# Patient Record
Sex: Male | Born: 1940 | Race: White | Hispanic: No | Marital: Married | Smoking: Former smoker
Health system: Southern US, Community
[De-identification: ages and names within clinical notes are randomized; demographics above are authoritative.]

## PROBLEM LIST (undated history)

## (undated) DIAGNOSIS — T4145XA Adverse effect of unspecified anesthetic, initial encounter: Secondary | ICD-10-CM

## (undated) DIAGNOSIS — R001 Bradycardia, unspecified: Secondary | ICD-10-CM

## (undated) DIAGNOSIS — I350 Nonrheumatic aortic (valve) stenosis: Secondary | ICD-10-CM

## (undated) DIAGNOSIS — D61818 Other pancytopenia: Secondary | ICD-10-CM

## (undated) DIAGNOSIS — D414 Neoplasm of uncertain behavior of bladder: Secondary | ICD-10-CM

## (undated) DIAGNOSIS — K219 Gastro-esophageal reflux disease without esophagitis: Secondary | ICD-10-CM

## (undated) DIAGNOSIS — R51 Headache: Secondary | ICD-10-CM

## (undated) DIAGNOSIS — F419 Anxiety disorder, unspecified: Secondary | ICD-10-CM

## (undated) DIAGNOSIS — K819 Cholecystitis, unspecified: Secondary | ICD-10-CM

## (undated) DIAGNOSIS — I10 Essential (primary) hypertension: Secondary | ICD-10-CM

## (undated) DIAGNOSIS — I714 Abdominal aortic aneurysm, without rupture, unspecified: Secondary | ICD-10-CM

## (undated) DIAGNOSIS — M2550 Pain in unspecified joint: Secondary | ICD-10-CM

## (undated) DIAGNOSIS — I480 Paroxysmal atrial fibrillation: Secondary | ICD-10-CM

## (undated) DIAGNOSIS — E119 Type 2 diabetes mellitus without complications: Secondary | ICD-10-CM

## (undated) DIAGNOSIS — I219 Acute myocardial infarction, unspecified: Secondary | ICD-10-CM

## (undated) DIAGNOSIS — I259 Chronic ischemic heart disease, unspecified: Secondary | ICD-10-CM

## (undated) DIAGNOSIS — I509 Heart failure, unspecified: Secondary | ICD-10-CM

## (undated) DIAGNOSIS — I2510 Atherosclerotic heart disease of native coronary artery without angina pectoris: Secondary | ICD-10-CM

## (undated) DIAGNOSIS — J189 Pneumonia, unspecified organism: Secondary | ICD-10-CM

## (undated) DIAGNOSIS — I779 Disorder of arteries and arterioles, unspecified: Secondary | ICD-10-CM

## (undated) DIAGNOSIS — F329 Major depressive disorder, single episode, unspecified: Secondary | ICD-10-CM

## (undated) DIAGNOSIS — E669 Obesity, unspecified: Secondary | ICD-10-CM

## (undated) DIAGNOSIS — Z952 Presence of prosthetic heart valve: Secondary | ICD-10-CM

## (undated) DIAGNOSIS — T8859XA Other complications of anesthesia, initial encounter: Secondary | ICD-10-CM

## (undated) DIAGNOSIS — G4733 Obstructive sleep apnea (adult) (pediatric): Secondary | ICD-10-CM

## (undated) DIAGNOSIS — Z950 Presence of cardiac pacemaker: Secondary | ICD-10-CM

## (undated) DIAGNOSIS — E785 Hyperlipidemia, unspecified: Secondary | ICD-10-CM

## (undated) DIAGNOSIS — J449 Chronic obstructive pulmonary disease, unspecified: Secondary | ICD-10-CM

## (undated) DIAGNOSIS — F32A Depression, unspecified: Secondary | ICD-10-CM

## (undated) DIAGNOSIS — Z8709 Personal history of other diseases of the respiratory system: Secondary | ICD-10-CM

## (undated) DIAGNOSIS — D649 Anemia, unspecified: Secondary | ICD-10-CM

## (undated) HISTORY — PX: OTHER SURGICAL HISTORY: SHX169

## (undated) HISTORY — PX: TONSILLECTOMY: SUR1361

## (undated) HISTORY — PX: AORTIC VALVE REPLACEMENT: SHX41

## (undated) HISTORY — DX: Abdominal aortic aneurysm, without rupture: I71.4

## (undated) HISTORY — DX: Heart failure, unspecified: I50.9

## (undated) HISTORY — PX: HERNIA REPAIR: SHX51

## (undated) HISTORY — DX: Nonrheumatic aortic (valve) stenosis: I35.0

## (undated) HISTORY — PX: ABDOMINAL AORTIC ANEURYSM REPAIR: SUR1152

## (undated) HISTORY — DX: Abdominal aortic aneurysm, without rupture, unspecified: I71.40

## (undated) HISTORY — PX: VALVE REPLACEMENT: SUR13

## (undated) HISTORY — DX: Chronic obstructive pulmonary disease, unspecified: J44.9

## (undated) HISTORY — PX: CORONARY ANGIOPLASTY WITH STENT PLACEMENT: SHX49

---
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
## 2000-07-07 ENCOUNTER — Encounter: Admission: RE | Admit: 2000-07-07 | Discharge: 2000-07-17 | Payer: Self-pay | Admitting: Orthopedic Surgery

## 2004-03-28 ENCOUNTER — Ambulatory Visit: Payer: Self-pay | Admitting: Family Medicine

## 2004-04-17 ENCOUNTER — Ambulatory Visit: Payer: Self-pay | Admitting: Family Medicine

## 2004-05-24 ENCOUNTER — Ambulatory Visit: Payer: Self-pay | Admitting: Family Medicine

## 2004-06-18 ENCOUNTER — Ambulatory Visit: Payer: Self-pay | Admitting: Family Medicine

## 2004-07-02 ENCOUNTER — Ambulatory Visit: Payer: Self-pay | Admitting: Family Medicine

## 2004-10-09 ENCOUNTER — Ambulatory Visit: Payer: Self-pay | Admitting: Family Medicine

## 2005-02-18 ENCOUNTER — Ambulatory Visit: Payer: Self-pay | Admitting: Family Medicine

## 2005-03-14 ENCOUNTER — Ambulatory Visit: Payer: Self-pay | Admitting: Family Medicine

## 2005-06-12 ENCOUNTER — Ambulatory Visit: Payer: Self-pay | Admitting: Family Medicine

## 2005-06-24 ENCOUNTER — Ambulatory Visit: Payer: Self-pay | Admitting: Family Medicine

## 2005-10-17 ENCOUNTER — Ambulatory Visit: Payer: Self-pay | Admitting: Family Medicine

## 2006-04-28 ENCOUNTER — Ambulatory Visit: Payer: Self-pay | Admitting: Family Medicine

## 2006-07-29 ENCOUNTER — Ambulatory Visit: Payer: Self-pay | Admitting: Family Medicine

## 2006-09-22 ENCOUNTER — Ambulatory Visit: Payer: Self-pay | Admitting: Family Medicine

## 2008-04-15 HISTORY — PX: CORONARY ANGIOPLASTY: SHX604

## 2012-02-20 ENCOUNTER — Other Ambulatory Visit: Payer: Self-pay | Admitting: Orthopedic Surgery

## 2012-02-20 DIAGNOSIS — M25511 Pain in right shoulder: Secondary | ICD-10-CM

## 2012-02-28 ENCOUNTER — Ambulatory Visit
Admission: RE | Admit: 2012-02-28 | Discharge: 2012-02-28 | Disposition: A | Discharge status: Home / Self Care | Payer: BC Managed Care – PPO | Source: Ambulatory Visit | Attending: Orthopedic Surgery | Admitting: Orthopedic Surgery

## 2012-02-28 ENCOUNTER — Other Ambulatory Visit: Payer: Self-pay | Admitting: Orthopedic Surgery

## 2012-02-28 DIAGNOSIS — M25511 Pain in right shoulder: Secondary | ICD-10-CM

## 2012-02-28 DIAGNOSIS — Z77018 Contact with and (suspected) exposure to other hazardous metals: Secondary | ICD-10-CM

## 2012-04-02 ENCOUNTER — Encounter (HOSPITAL_COMMUNITY): Payer: Self-pay | Admitting: Pharmacy Technician

## 2012-04-10 ENCOUNTER — Encounter (HOSPITAL_COMMUNITY)
Admission: RE | Admit: 2012-04-10 | Discharge: 2012-04-10 | Disposition: A | Discharge status: Home / Self Care | Payer: BC Managed Care – PPO | Source: Ambulatory Visit | Attending: Anesthesiology | Admitting: Anesthesiology

## 2012-04-10 ENCOUNTER — Encounter (HOSPITAL_COMMUNITY): Payer: Self-pay

## 2012-04-10 ENCOUNTER — Encounter (HOSPITAL_COMMUNITY)
Admission: RE | Admit: 2012-04-10 | Discharge: 2012-04-10 | Disposition: A | Discharge status: Home / Self Care | Payer: BC Managed Care – PPO | Source: Ambulatory Visit | Attending: Orthopedic Surgery | Admitting: Orthopedic Surgery

## 2012-04-10 HISTORY — DX: Pain in unspecified joint: M25.50

## 2012-04-10 HISTORY — DX: Gastro-esophageal reflux disease without esophagitis: K21.9

## 2012-04-10 HISTORY — DX: Hyperlipidemia, unspecified: E78.5

## 2012-04-10 HISTORY — DX: Essential (primary) hypertension: I10

## 2012-04-10 HISTORY — DX: Personal history of other diseases of the respiratory system: Z87.09

## 2012-04-10 HISTORY — DX: Pneumonia, unspecified organism: J18.9

## 2012-04-10 HISTORY — DX: Adverse effect of unspecified anesthetic, initial encounter: T41.45XA

## 2012-04-10 HISTORY — DX: Other complications of anesthesia, initial encounter: T88.59XA

## 2012-04-10 HISTORY — DX: Headache: R51

## 2012-04-10 HISTORY — DX: Atherosclerotic heart disease of native coronary artery without angina pectoris: I25.10

## 2012-04-10 HISTORY — DX: Type 2 diabetes mellitus without complications: E11.9

## 2012-04-10 HISTORY — DX: Acute myocardial infarction, unspecified: I21.9

## 2012-04-10 LAB — CBC
HCT: 41.1 % (ref 39.0–52.0)
Hemoglobin: 13.8 g/dL (ref 13.0–17.0)
MCH: 31 pg (ref 26.0–34.0)
MCHC: 33.6 g/dL (ref 30.0–36.0)
RBC: 4.45 MIL/uL (ref 4.22–5.81)

## 2012-04-10 LAB — BASIC METABOLIC PANEL
BUN: 14 mg/dL (ref 6–23)
Chloride: 102 mEq/L (ref 96–112)
Glucose, Bld: 111 mg/dL — ABNORMAL HIGH (ref 70–99)
Potassium: 3.8 mEq/L (ref 3.5–5.1)
Sodium: 140 mEq/L (ref 135–145)

## 2012-04-10 NOTE — Progress Notes (Signed)
Requested orders from [place] @ Dr.Caffrey's office

## 2012-04-10 NOTE — Pre-Procedure Instructions (Signed)
20 Matthew Campos  04/10/2012   Your procedure is scheduled on:  Fri, Jan 3 @ 10:00 AM  Report to [facility] at 8:00 AM.  Call this number if you have problems the morning of surgery: [phone]   Remember:   Do not eat food:After Midnight.    Take these medicines the morning of surgery with A SIP OF WATER: Dexilant(Dexlansoprazole),Pain Pill(if needed), and Metoprolol(Lopressor)   Do not wear jewelry  Do not wear lotions, powders, or colognes. You may wear deodorant.  Men may shave face and neck.  Do not bring valuables to the hospital.  Contacts, dentures or bridgework may not be worn into surgery.  Leave suitcase in the car. After surgery it may be brought to your room.  For patients admitted to the hospital, checkout time is 11:00 AM the day of discharge.   Patients discharged the day of surgery will not be allowed to drive home.  Special Instructions: Shower using CHG 2 nights before surgery and the night before surgery.  If you shower the day of surgery use CHG.  Use special wash - you have one bottle of CHG for all showers.  You should use approximately 1/3 of the bottle for each shower.   Please read over the following fact sheets that you were given: Pain Booklet, Coughing and Deep Breathing, MRSA Information and Surgical Site Infection Prevention

## 2012-04-10 NOTE — Progress Notes (Signed)
Dr. Cloria Spring is cardiologist with last visit in Oct 2013-to request report  Stress test done >13yrs ago Echo done > 66yrs ago Last heart cath-to request from [facility] in [place] is medical md  EKG to be requested from Dr.Michael Dorena Cookey Denies cxr being done within past yr

## 2012-04-10 NOTE — Progress Notes (Signed)
Sleep study done > 62yrs ago and was using a CPAP but not in 39yrs

## 2012-04-10 NOTE — Progress Notes (Signed)
04/10/12 1255  OBSTRUCTIVE SLEEP APNEA  Have you ever been diagnosed with sleep apnea through a sleep study? Yes  If yes, do you have and use a CPAP or BPAP machine every night? 0  Do you snore loudly (loud enough to be heard through closed doors)?  1  Do you often feel tired, fatigued, or sleepy during the daytime? 1  Has anyone observed you stop breathing during your sleep? 1  Do you have, or are you being treated for high blood pressure? 1  BMI more than 35 kg/m2? 0  Age over 19 years old? 1  Gender: 1  Obstructive Sleep Apnea Score 6   Score 4 or greater  Results sent to PCP

## 2012-04-16 ENCOUNTER — Other Ambulatory Visit: Payer: Self-pay | Admitting: Physician Assistant

## 2012-04-16 NOTE — H&P (Signed)
[facility] [address]   [address] [place], [place] [address] [phone] A Division of [facility]  Daniel F. Murphy, M.D.   Robert A. Wainer, M.D.   W. Dan Caffrey, M.D.   Sadler Teschner P. Landau, M.D.   Anna Voytek, M.D Wadena R. Ibazebo, M.D.  S. Michael Tooke, M.D.   James S. Kramer, M.D.   Rebecca S. Bassett, M.D. James M. Owens, PA-C            Kirstin A. Shepperson, PA-C Josh Kristine Chahal, PA-C Brandon Parry, OPA-C   RE: Matthew Campos, Matthew Campos                                [id number]      DOB: 03/06/1941 PROGRESS NOTE: 04-06-12 Chief complaint: Right rotator cuff tear.  History of present illness: Patient is a 71 year-old male initially seen back in September of this year with complaints of right shoulder pain.  She was treated conservatively since that time.  Failed conservative treatments include home exercise program, Corticosteroid injection and pain medication.  We had obtained an MRI which shows a severe tear at the cuff, subscapularis.  Patient with a significant cardiac history.  We discussed that he would need cardiac clearance prior to scheduling any surgical intervention.   Review of systems: Ten point review of systems obtained.  Positive for glasses/contacts, heart attack, heart disease, diabetes, not urinating and sleep apnea.   Past medical history: Diabetes, high cholesterol, hypertension, sleep apnea, history of MI with stent placement x 3, history of aneurysm and left shoulder rotator cuff repair. Current medications: Losartan 100 mg daily, Lopressor 100 mg daily, Lipitor 80 mg daily, Dexilant 60 mg daily, Metformin 1000 mg once in the morning and half in the evening, Plavix 75 mg daily, Vitamin E, baby Aspirin, multivitamin, fish oil, Januvia 100 mg daily, Hydrocodone 5/325 p.r.n. pain and Nitroglycerin 0.4 mg sublingual p.r.n. chest pain.   Allergies: He lists drug allergies to Codeine and latex. Family history: Positive  for heart disease and diabetes in his father.  Otherwise negative.   Social history: Patient is a pack per day smoker for 30 years, stopped in 1989.  He does not drink alcohol.  He is married and lives with his wife. He works as a pallet builder.    EXAMINATION: Temperature: 98.6.  Heart rate: 59.  Blood pressure: 130/76.  Respirations: 18.  Patient is seated in the exam room in no acute distress.  Alert and oriented.  Appears appropriate age.  Examination of the right upper extremity shows he is neurovascularly intact.  He has a painful arc of motion with the right shoulder.  Positive impingement.  Weakness noted with supraspinatus.  HEENT: Pupils are equal, round and reactive to light.  No dentures or oral implants.  Neck is supple with good range of motion.  No cervical adenopathy.  Chest and lungs are clear to auscultation bilaterally.  Normal breath sounds.  Normal effort.  Cardiac: Regular rate and rhythm.  He does have a systolic murmur.  Abdomen is soft and non-tender.  Non-distended.  Active bowel sounds in all four quadrants.  Neuro: Cranial nerves are grossly intact.  Skin: No rashes or lesions.  No erythema.  GU/Rectal/Breasts: Not indicated for surgery.    X-RAYS: No images obtained today.  See MRI results above.   IMPRESSION: 1. Right rotator cuff tear.   2. Diabetes. 3. High cholesterol.   4. Hypertension. 5. Sleep apnea. 6. History of MI with stents.    PLAN: We have discussed the risks and benefits of right shoulder surgery.  Plan for right shoulder arthroscopy, acromioplasty and possible open rotator cuff repair.  He has obtained cardiac clearance from his cardiologist, Dr. Kutcher, stating it is okay to stop Plavix five days prior to surgery.  He should resume his baby Aspirin daily.  Preadmission appointment scheduled for April 10, 2012.  Surgery is scheduled for April 17, 2012.  If he has any questions or concerns prior to surgery he may contact the office.    Alaa Eyerman A.  Avigayil Ton, PA-C 

## 2012-04-17 ENCOUNTER — Ambulatory Visit (HOSPITAL_COMMUNITY): Payer: BC Managed Care – PPO | Admitting: Certified Registered Nurse Anesthetist

## 2012-04-17 ENCOUNTER — Encounter (HOSPITAL_COMMUNITY): Payer: Self-pay | Admitting: Certified Registered Nurse Anesthetist

## 2012-04-17 ENCOUNTER — Ambulatory Visit (HOSPITAL_COMMUNITY)
Admission: RE | Admit: 2012-04-17 | Discharge: 2012-04-17 | Disposition: A | Discharge status: Home / Self Care | Payer: BC Managed Care – PPO | Source: Ambulatory Visit | Attending: Orthopedic Surgery | Admitting: Orthopedic Surgery

## 2012-04-17 ENCOUNTER — Encounter (HOSPITAL_COMMUNITY)
Admission: RE | Disposition: A | Discharge status: Home / Self Care | Payer: Self-pay | Source: Ambulatory Visit | Attending: Orthopedic Surgery

## 2012-04-17 DIAGNOSIS — Z7902 Long term (current) use of antithrombotics/antiplatelets: Secondary | ICD-10-CM | POA: Insufficient documentation

## 2012-04-17 DIAGNOSIS — I2510 Atherosclerotic heart disease of native coronary artery without angina pectoris: Secondary | ICD-10-CM | POA: Insufficient documentation

## 2012-04-17 DIAGNOSIS — E7800 Pure hypercholesterolemia, unspecified: Secondary | ICD-10-CM | POA: Insufficient documentation

## 2012-04-17 DIAGNOSIS — F17200 Nicotine dependence, unspecified, uncomplicated: Secondary | ICD-10-CM | POA: Insufficient documentation

## 2012-04-17 DIAGNOSIS — G4730 Sleep apnea, unspecified: Secondary | ICD-10-CM | POA: Insufficient documentation

## 2012-04-17 DIAGNOSIS — E119 Type 2 diabetes mellitus without complications: Secondary | ICD-10-CM | POA: Insufficient documentation

## 2012-04-17 DIAGNOSIS — M19019 Primary osteoarthritis, unspecified shoulder: Secondary | ICD-10-CM | POA: Insufficient documentation

## 2012-04-17 DIAGNOSIS — I10 Essential (primary) hypertension: Secondary | ICD-10-CM | POA: Insufficient documentation

## 2012-04-17 DIAGNOSIS — M24819 Other specific joint derangements of unspecified shoulder, not elsewhere classified: Secondary | ICD-10-CM | POA: Insufficient documentation

## 2012-04-17 DIAGNOSIS — M75120 Complete rotator cuff tear or rupture of unspecified shoulder, not specified as traumatic: Secondary | ICD-10-CM | POA: Insufficient documentation

## 2012-04-17 DIAGNOSIS — M25819 Other specified joint disorders, unspecified shoulder: Secondary | ICD-10-CM | POA: Insufficient documentation

## 2012-04-17 DIAGNOSIS — Z7982 Long term (current) use of aspirin: Secondary | ICD-10-CM | POA: Insufficient documentation

## 2012-04-17 DIAGNOSIS — Z9861 Coronary angioplasty status: Secondary | ICD-10-CM | POA: Insufficient documentation

## 2012-04-17 DIAGNOSIS — I252 Old myocardial infarction: Secondary | ICD-10-CM | POA: Insufficient documentation

## 2012-04-17 HISTORY — PX: SHOULDER ARTHROSCOPY WITH ROTATOR CUFF REPAIR AND SUBACROMIAL DECOMPRESSION: SHX5686

## 2012-04-17 LAB — GLUCOSE, CAPILLARY: Glucose-Capillary: 106 mg/dL — ABNORMAL HIGH (ref 70–99)

## 2012-04-17 SURGERY — SHOULDER ARTHROSCOPY WITH ROTATOR CUFF REPAIR AND SUBACROMIAL DECOMPRESSION
Anesthesia: Regional | Site: Shoulder | Laterality: Right | Wound class: Clean

## 2012-04-17 MED ORDER — SODIUM CHLORIDE 0.9 % IV SOLN
10.0000 mg | INTRAVENOUS | Status: DC | PRN
  Administered 2012-04-17: 25 ug/min via INTRAVENOUS

## 2012-04-17 MED ORDER — LIDOCAINE HCL 4 % MT SOLN
OROMUCOSAL | Status: DC | PRN
  Administered 2012-04-17: 4 mL via TOPICAL

## 2012-04-17 MED ORDER — ROCURONIUM BROMIDE 100 MG/10ML IV SOLN
INTRAVENOUS | Status: DC | PRN
  Administered 2012-04-17: 50 mg via INTRAVENOUS

## 2012-04-17 MED ORDER — ONDANSETRON HCL 4 MG/2ML IJ SOLN: 4.0000 mg | Freq: Four times a day (QID) | INTRAMUSCULAR | Status: DC | PRN

## 2012-04-17 MED ORDER — FENTANYL CITRATE 0.05 MG/ML IJ SOLN
50.0000 ug | INTRAMUSCULAR | Status: DC | PRN
  Administered 2012-04-17: 50 ug via INTRAVENOUS

## 2012-04-17 MED ORDER — SODIUM CHLORIDE 0.9 % IR SOLN
Status: DC | PRN
  Administered 2012-04-17 (×2): 3000 mL

## 2012-04-17 MED ORDER — LIDOCAINE HCL (CARDIAC) 20 MG/ML IV SOLN
INTRAVENOUS | Status: DC | PRN
  Administered 2012-04-17: 100 mg via INTRAVENOUS

## 2012-04-17 MED ORDER — METHOCARBAMOL 100 MG/ML IJ SOLN: 500.0000 mg | Freq: Four times a day (QID) | INTRAVENOUS | Status: DC | PRN

## 2012-04-17 MED ORDER — METHOCARBAMOL 100 MG/ML IJ SOLN
500.0000 mg | INTRAVENOUS | Status: AC
  Administered 2012-04-17: 500 mg via INTRAVENOUS
  Filled 2012-04-17: qty 5

## 2012-04-17 MED ORDER — PHENYLEPHRINE HCL 10 MG/ML IJ SOLN
INTRAMUSCULAR | Status: DC | PRN
  Administered 2012-04-17: 80 ug via INTRAVENOUS
  Administered 2012-04-17: 40 ug via INTRAVENOUS

## 2012-04-17 MED ORDER — CHLORHEXIDINE GLUCONATE 4 % EX LIQD: 60.0000 mL | Freq: Once | CUTANEOUS | Status: DC

## 2012-04-17 MED ORDER — FENTANYL CITRATE 0.05 MG/ML IJ SOLN
INTRAMUSCULAR | Status: AC
  Filled 2012-04-17: qty 2

## 2012-04-17 MED ORDER — NEOSTIGMINE METHYLSULFATE 1 MG/ML IJ SOLN
INTRAMUSCULAR | Status: DC | PRN
  Administered 2012-04-17: 4 mg via INTRAVENOUS

## 2012-04-17 MED ORDER — HYDROMORPHONE HCL PF 1 MG/ML IJ SOLN: 0.2500 mg | INTRAMUSCULAR | Status: DC | PRN

## 2012-04-17 MED ORDER — MIDAZOLAM HCL 2 MG/2ML IJ SOLN
INTRAMUSCULAR | Status: AC
  Filled 2012-04-17: qty 2

## 2012-04-17 MED ORDER — SODIUM CHLORIDE 0.9 % IV SOLN: INTRAVENOUS | Status: DC

## 2012-04-17 MED ORDER — CEFAZOLIN SODIUM-DEXTROSE 2-3 GM-% IV SOLR: 2.0000 g | INTRAVENOUS | Status: DC

## 2012-04-17 MED ORDER — METHOCARBAMOL 500 MG PO TABS
500.0000 mg | ORAL_TABLET | Freq: Four times a day (QID) | ORAL | Status: DC | PRN
Start: 2012-04-17 — End: 2012-04-17

## 2012-04-17 MED ORDER — MIDAZOLAM HCL 2 MG/2ML IJ SOLN
1.0000 mg | INTRAMUSCULAR | Status: DC | PRN
  Administered 2012-04-17: 1 mg via INTRAVENOUS

## 2012-04-17 MED ORDER — FENTANYL CITRATE 0.05 MG/ML IJ SOLN
INTRAMUSCULAR | Status: DC | PRN
  Administered 2012-04-17: 50 ug via INTRAVENOUS

## 2012-04-17 MED ORDER — PROPOFOL 10 MG/ML IV BOLUS
INTRAVENOUS | Status: DC | PRN
  Administered 2012-04-17: 170 mg via INTRAVENOUS

## 2012-04-17 MED ORDER — GLYCOPYRROLATE 0.2 MG/ML IJ SOLN
INTRAMUSCULAR | Status: DC | PRN
  Administered 2012-04-17: 0.6 mg via INTRAVENOUS
  Administered 2012-04-17: 0.2 mg via INTRAVENOUS

## 2012-04-17 MED ORDER — CEFAZOLIN SODIUM-DEXTROSE 2-3 GM-% IV SOLR
INTRAVENOUS | Status: AC
  Administered 2012-04-17: 2 g via INTRAVENOUS
  Filled 2012-04-17: qty 50

## 2012-04-17 MED ORDER — LACTATED RINGERS IV SOLN
INTRAVENOUS | Status: DC | PRN
  Administered 2012-04-17: 10:00:00 via INTRAVENOUS

## 2012-04-17 MED ORDER — LACTATED RINGERS IV SOLN
INTRAVENOUS | Status: DC
  Administered 2012-04-17: 09:00:00 via INTRAVENOUS

## 2012-04-17 MED ORDER — ONDANSETRON HCL 4 MG/2ML IJ SOLN
INTRAMUSCULAR | Status: DC | PRN
  Administered 2012-04-17: 4 mg via INTRAVENOUS

## 2012-04-17 SURGICAL SUPPLY — 72 items
ANCH SUT SWLK 19.1X4.75 VT (Anchor) ×1 IMPLANT
ANCH SUT SWLK 19.1X5.5 CLS EL (Anchor) ×1 IMPLANT
ANCHOR PEEK 4.75X19.1 SWLK C (Anchor) ×1 IMPLANT
ANCHOR PEEK SWIVEL LOCK 5.5 (Anchor) ×1 IMPLANT
APL SKNCLS STERI-STRIP NONHPOA (GAUZE/BANDAGES/DRESSINGS) ×1
BENZOIN TINCTURE PRP APPL 2/3 (GAUZE/BANDAGES/DRESSINGS) ×1 IMPLANT
BIT DRILL TAK (DRILL) IMPLANT
BLADE AVERAGE 25X9 (BLADE) ×1 IMPLANT
BLADE CUDA 5.5 (BLADE) IMPLANT
BLADE GREAT WHITE 4.2 (BLADE) ×1 IMPLANT
BLADE SURG 11 STRL SS (BLADE) ×2 IMPLANT
BUR OVAL 6.0 (BURR) ×2 IMPLANT
CANNULA SHOULDER 7CM (CANNULA) ×2 IMPLANT
CLOTH BEACON ORANGE TIMEOUT ST (SAFETY) ×2 IMPLANT
CLSR STERI-STRIP ANTIMIC 1/2X4 (GAUZE/BANDAGES/DRESSINGS) ×1 IMPLANT
DRAPE STERI 35X30 U-POUCH (DRAPES) ×2 IMPLANT
DRAPE SURG 17X23 STRL (DRAPES) ×2 IMPLANT
DRAPE U-SHAPE 47X51 STRL (DRAPES) ×2 IMPLANT
DRILL TAK (DRILL)
DRSG ADAPTIC 3X8 NADH LF (GAUZE/BANDAGES/DRESSINGS) ×2 IMPLANT
DRSG PAD ABDOMINAL 8X10 ST (GAUZE/BANDAGES/DRESSINGS) ×2 IMPLANT
DURAPREP 26ML APPLICATOR (WOUND CARE) ×2 IMPLANT
GLOVE BIOGEL PI IND STRL 7.0 (GLOVE) IMPLANT
GLOVE BIOGEL PI IND STRL 8 (GLOVE) ×2 IMPLANT
GLOVE BIOGEL PI INDICATOR 7.0 (GLOVE) ×1
GLOVE BIOGEL PI INDICATOR 8 (GLOVE) ×2
GLOVE ORTHO TXT STRL SZ7.5 (GLOVE) ×2 IMPLANT
GLOVE SURG ORTHO 8.0 STRL STRW (GLOVE) ×3 IMPLANT
GLOVE SURG SS PI 6.5 STRL IVOR (GLOVE) ×2 IMPLANT
GLOVE SURG SS PI 8.0 STRL IVOR (GLOVE) ×3 IMPLANT
GOWN PREVENTION PLUS XLARGE (GOWN DISPOSABLE) ×4 IMPLANT
GOWN STRL NON-REIN LRG LVL3 (GOWN DISPOSABLE) ×2 IMPLANT
GOWN STRL REIN 2XL LVL4 (GOWN DISPOSABLE) ×1 IMPLANT
KIT BASIN OR (CUSTOM PROCEDURE TRAY) ×2 IMPLANT
KIT ROOM TURNOVER OR (KITS) ×2 IMPLANT
MANIFOLD NEPTUNE II (INSTRUMENTS) ×2 IMPLANT
NDL 1/2 CIR CATGUT .05X1.09 (NEEDLE) IMPLANT
NDL SCORPION MULTI FIRE (NEEDLE) IMPLANT
NDL SPNL 18GX3.5 QUINCKE PK (NEEDLE) ×1 IMPLANT
NEEDLE 1/2 CIR CATGUT .05X1.09 (NEEDLE) ×2 IMPLANT
NEEDLE 22X1 1/2 (OR ONLY) (NEEDLE) ×2 IMPLANT
NEEDLE SCORPION MULTI FIRE (NEEDLE) ×2 IMPLANT
NEEDLE SPNL 18GX3.5 QUINCKE PK (NEEDLE) ×2 IMPLANT
NS IRRIG 1000ML POUR BTL (IV SOLUTION) IMPLANT
PACK SHOULDER (CUSTOM PROCEDURE TRAY) ×2 IMPLANT
PAD ARMBOARD 7.5X6 YLW CONV (MISCELLANEOUS) ×2 IMPLANT
SET ARTHROSCOPY TUBING (MISCELLANEOUS) ×2
SET ARTHROSCOPY TUBING LN (MISCELLANEOUS) ×1 IMPLANT
SLEEVE SURGEON STRL (DRAPES) ×6 IMPLANT
SPEAR FASTAKII (SLEEVE) IMPLANT
SPONGE GAUZE 4X4 12PLY (GAUZE/BANDAGES/DRESSINGS) ×2 IMPLANT
SPONGE LAP 4X18 X RAY DECT (DISPOSABLE) ×4 IMPLANT
STRIP CLOSURE SKIN 1/2X4 (GAUZE/BANDAGES/DRESSINGS) ×1 IMPLANT
SUT ETHIBOND NAB CT1 #1 30IN (SUTURE) ×1 IMPLANT
SUT ETHILON 3 0 PS 1 (SUTURE) ×2 IMPLANT
SUT MNCRL AB 3-0 PS2 18 (SUTURE) ×2 IMPLANT
SUT TIGER TAPE 7 IN WHITE (SUTURE) ×1 IMPLANT
SUT VIC AB 0 CT1 27 (SUTURE) ×2
SUT VIC AB 0 CT1 27XBRD ANBCTR (SUTURE) IMPLANT
SUT VIC AB 1 CT1 27 (SUTURE) ×2
SUT VIC AB 1 CT1 27XBRD ANBCTR (SUTURE) IMPLANT
SUT VIC AB 2-0 CT1 27 (SUTURE) ×2
SUT VIC AB 2-0 CT1 TAPERPNT 27 (SUTURE) IMPLANT
SYR 20ML ECCENTRIC (SYRINGE) ×2 IMPLANT
SYR CONTROL 10ML LL (SYRINGE) ×2 IMPLANT
TAPE CLOTH SURG 4X10 WHT LF (GAUZE/BANDAGES/DRESSINGS) ×2 IMPLANT
TAPE FIBER 2MM 7IN #2 BLUE (SUTURE) ×2 IMPLANT
TOWEL OR 17X24 6PK STRL BLUE (TOWEL DISPOSABLE) ×2 IMPLANT
TOWEL OR 17X26 10 PK STRL BLUE (TOWEL DISPOSABLE) ×2 IMPLANT
WAND 90 DEG TURBOVAC W/CORD (SURGICAL WAND) IMPLANT
WATER STERILE IRR 1000ML POUR (IV SOLUTION) ×2 IMPLANT
YANKAUER SUCT BULB TIP NO VENT (SUCTIONS) ×1 IMPLANT

## 2012-04-17 NOTE — Brief Op Note (Signed)
04/17/2012  12:02 PM  PATIENT:  Matthew Campos  72 y.o. male  PRE-OPERATIVE DIAGNOSIS:  right shoulder bursitis, effusion, osteoarthritis, complete rupture of rotator cuff, disorder articular cartilage-shoulder, degenerative arthritis  POST-OPERATIVE DIAGNOSIS:  right shoulder bursitis, effusion, osteoarthritis, complete rupture of rotator cuff, disorder articular cartilage-shoulder, degenerative arthritis  PROCEDURE:  Procedure(s) (LRB) with comments: SHOULDER ARTHROSCOPY WITH ROTATOR CUFF REPAIR AND SUBACROMIAL DECOMPRESSION (Right) - RIGHT SHOULDER ROTATOR CUFF REPAIR INCLUDING ACROMIOPLASTY CHRONIC, ARTHROSCOPY SHOULDER DEBRIDEMENT EXTENSIVE  SURGEON:  Surgeon(s) and Role:    * W D Carloyn Manner., MD - Primary  PHYSICIAN ASSISTANT:   ASSISTANTS: Girtha Kilgore, pa-c   ANESTHESIA:   general and IS block  EBL:     BLOOD ADMINISTERED:none  DRAINS: none   LOCAL MEDICATIONS USED:  NONE  SPECIMEN:  No Specimen  DISPOSITION OF SPECIMEN:  N/A  COUNTS:  YES  TOURNIQUET:  * No tourniquets in log *  DICTATION: .Other Dictation: Dictation Number   PLAN OF CARE: Discharge to home after PACU  PATIENT DISPOSITION:  PACU - hemodynamically stable.   Delay start of Pharmacological VTE agent (>24hrs) due to surgical blood loss or risk of bleeding: n/a

## 2012-04-17 NOTE — Progress Notes (Signed)
Spoke with dr massage for d/c sign out said he will do soon as possible

## 2012-04-17 NOTE — Progress Notes (Signed)
Orthopedic Tech Progress Note Patient Details:  Matthew Campos 21-Feb-1941 [id number]  Ortho Devices Type of Ortho Device: Abduction pillow Ortho Device/Splint Interventions: Ordered Delivered to OR desk.   [name] T 04/17/2012, 11:53 AM

## 2012-04-17 NOTE — Transfer of Care (Signed)
Immediate Anesthesia Transfer of Care Note  Patient: Matthew Campos  Procedure(s) Performed: Procedure(s) (LRB) with comments: SHOULDER ARTHROSCOPY WITH ROTATOR CUFF REPAIR AND SUBACROMIAL DECOMPRESSION (Right) - RIGHT SHOULDER ROTATOR CUFF REPAIR INCLUDING ACROMIOPLASTY CHRONIC, ARTHROSCOPY SHOULDER DEBRIDEMENT EXTENSIVE  Patient Location: PACU  Anesthesia Type:General  Level of Consciousness: awake, alert  and oriented  Airway & Oxygen Therapy: Patient Spontanous Breathing and Patient connected to face mask oxygen  Post-op Assessment: Report given to PACU RN and Post -op Vital signs reviewed and stable  Post vital signs: Reviewed and stable  Complications: No apparent anesthesia complications

## 2012-04-17 NOTE — Progress Notes (Signed)
Report given to stephanie rn as caregiver 

## 2012-04-17 NOTE — Op Note (Signed)
NAME:  Matthew Campos, Matthew Campos NO.:  [id number]  MEDICAL RECORD NO.:  [id number]  LOCATION:  [facility]                         FACILITY:  [facility]  PHYSICIAN:  Dyke Brackett, M.D.    DATE OF BIRTH:  [birth year]  DATE OF PROCEDURE:  04/17/2012 DATE OF DISCHARGE:  04/17/2012                              OPERATIVE REPORT   INDICATIONS:  This is a 71 year old with large symptomatic rotator cuff tear thought to be amenable to outpatient possible overnight hospitalization for right shoulder.  PREOPERATIVE DIAGNOSES: 1. Massive rotator cuff tear, right shoulder. 2. Degenerative tearing of anterior superior labrum. 3. Impingement. 4. AC arthritis.  POSTOPERATIVE DIAGNOSES: 1. Massive rotator cuff tear, right shoulder. 2. Degenerative tearing of anterior superior labrum. 3. Impingement. 4. AC arthritis.  OPERATION: 1. Open rotator cuff repair with acromioplasty procedure, chronic. 2. Arthroscopic debridement, intraarticular. 3. Open distal clavicle excision, right shoulder.  SURGEON:  Dyke Brackett, M.D.  ASSISTANT:  Margart Sickles, PA-C.  ANESTHESIA:  General with nerve block.  DESCRIPTION OF PROCEDURE:  Beach-chair positioning general anesthetic with a nerve block.  Posterior and anterior portals created.  The glenohumeral joint showed a moderate amount of degenerative tearing of the labrum.  There was no significant degenerative tear of the articular surface.  Aggressive debridement of the torn labrum was noted.  Severe tendinopathy with a partial rupture of the subscap was noted with some subluxation of the biceps.  We debrided around this area as well.  Very large rotator cuff tear with complete tear of the supraspinatus.  Thin atrophic tissue was encountered, moderate atrophy, however, it was my feeling that while the tissue was not operable that some attempted repair was indicated.  Excision was made dissecting the acromial AC interval.  We encountered  a significant amount of AC arthritis such as 1 cm to 1.5 cm distal clavicle, significant impingement through a very thickened acromion and performed acromioplasty.  Bursectomy carried out.  We immobilized the cup compressing the edges with a 15 blade and brought out the superior tuberosity.  Elected to fix the cup with swivel locks.  We placed a 475 swivel lock with 2 fiber tapes and also incorporated 1 suture into a bony bed for medial row.  We brought bit more medial than typically due to the thin atrophic nature of the cup.  We then fashioned this to the lateral side of the humerus for lateral row with 55  swivel lock and then incorporated one of those sutures as well.  This created reasonably good repair particularly given the severity of his tear and the thin tissue.  Closure was thick with #1 Ethibond on the deltoid, 0 and 2-0 Vicryl and Monocryl on the skin.  Light compressive sterile dressing were applied.  Taken to the recovery room in stable condition.     Dyke Brackett, M.D.     WDC/MEDQ  D:  04/17/2012  T:  04/17/2012  Job:  [id number]

## 2012-04-17 NOTE — Anesthesia Preprocedure Evaluation (Signed)
Anesthesia Evaluation  Patient identified by MRN, date of birth, ID band Patient awake    Reviewed: Allergy & Precautions, H&P , NPO status , Patient's Chart, lab work & pertinent test results  Airway Mallampati: II  Neck ROM: full    Dental   Pulmonary          Cardiovascular hypertension, + CAD and + Past MI     Neuro/Psych  Headaches,    GI/Hepatic GERD-  ,  Endo/Other  diabetes, Type 2  Renal/GU      Musculoskeletal   Abdominal   Peds  Hematology   Anesthesia Other Findings   Reproductive/Obstetrics                           Anesthesia Physical Anesthesia Plan  ASA: III  Anesthesia Plan: General and Regional   Post-op Pain Management: MAC Combined w/ Regional for Post-op pain   Induction: Intravenous  Airway Management Planned: Oral ETT  Additional Equipment:   Intra-op Plan:   Post-operative Plan: Extubation in OR  Informed Consent: I have reviewed the patients History and Physical, chart, labs and discussed the procedure including the risks, benefits and alternatives for the proposed anesthesia with the patient or authorized representative who has indicated his/her understanding and acceptance.     Plan Discussed with: CRNA and Surgeon  Anesthesia Plan Comments:         Anesthesia Quick Evaluation

## 2012-04-17 NOTE — Interval H&P Note (Signed)
History and Physical Interval Note:  04/17/2012 9:51 AM  Matthew Campos  has presented today for surgery, with the diagnosis of right shoulder bursitis, effusion, osteoarthritis, complete rupture of rotator cuff, disorder articular cartilage-shoulder, degenerative arthritis  The various methods of treatment have been discussed with the patient and family. After consideration of risks, benefits and other options for treatment, the patient has consented to  Procedure(s) (LRB) with comments: SHOULDER ARTHROSCOPY WITH ROTATOR CUFF REPAIR AND SUBACROMIAL DECOMPRESSION (Right) - RIGHT SHOULDER ROTATOR CUFF REPAIR INCLUDING ACROMIOPLASTY CHRONIC, ARTHROSCOPY SHOULDER DEBRIDEMENT EXTENSIVE as a surgical intervention .  The patient's history has been reviewed, patient examined, no change in status, stable for surgery.  I have reviewed the patient's chart and labs.  Questions were answered to the patient's satisfaction.     Svea Pusch JR,W D

## 2012-04-17 NOTE — H&P (View-Only) (Signed)
[facility] [address]   [address] [place], [place] [address] [phone] A Division of [facility]  Loreta Ave, M.D.   Robert A. Thurston Hole, M.D.   Burnell Blanks, M.D.   Eulas Post, M.D.   Lunette Stands, M.D Buford Dresser, M.D.  Charlsie Quest, M.D.   Estell Harpin, M.D.   Melina Fiddler, M.D. Genene Churn. Barry Dienes, PA-C            Kirstin A. Shepperson, PA-C Josh [place], PA-C [place], [place]   RE: Matthew Campos, Matthew Campos                                [id number]      DOB: 1940/10/21 PROGRESS NOTE: 04-06-12 Chief complaint: Right rotator cuff tear.  History of present illness: Patient is a 72 year-old male initially seen back in September of this year with complaints of right shoulder pain.  She was treated conservatively since that time.  Failed conservative treatments include home exercise program, Corticosteroid injection and pain medication.  We had obtained an MRI which shows a severe tear at the cuff, subscapularis.  Patient with a significant cardiac history.  We discussed that he would need cardiac clearance prior to scheduling any surgical intervention.   Review of systems: Ten point review of systems obtained.  Positive for glasses/contacts, heart attack, heart disease, diabetes, not urinating and sleep apnea.   Past medical history: Diabetes, high cholesterol, hypertension, sleep apnea, history of MI with stent placement x 3, history of aneurysm and left shoulder rotator cuff repair. Current medications: Losartan 100 mg daily, Lopressor 100 mg daily, Lipitor 80 mg daily, Dexilant 60 mg daily, Metformin 1000 mg once in the morning and half in the evening, Plavix 75 mg daily, Vitamin E, baby Aspirin, multivitamin, fish oil, Januvia 100 mg daily, Hydrocodone 5/325 p.r.n. pain and Nitroglycerin 0.4 mg sublingual p.r.n. chest pain.   Allergies: He lists drug allergies to Codeine and latex. Family history: Positive  for heart disease and diabetes in his father.  Otherwise negative.   Social history: Patient is a pack per day smoker for 30 years, stopped in 1989.  He does not drink alcohol.  He is married and lives with his wife. He works as a Geophysicist/field seismologist.    EXAMINATION: Temperature: 98.6.  Heart rate: 59.  Blood pressure: 130/76.  Respirations: 18.  Patient is seated in the exam room in no acute distress.  Alert and oriented.  Appears appropriate age.  Examination of the right upper extremity shows he is neurovascularly intact.  He has a painful arc of motion with the right shoulder.  Positive impingement.  Weakness noted with supraspinatus.  HEENT: Pupils are equal, round and reactive to light.  No dentures or oral implants.  Neck is supple with good range of motion.  No cervical adenopathy.  Chest and lungs are clear to auscultation bilaterally.  Normal breath sounds.  Normal effort.  Cardiac: Regular rate and rhythm.  He does have a systolic murmur.  Abdomen is soft and non-tender.  Non-distended.  Active bowel sounds in all four quadrants.  Neuro: Cranial nerves are grossly intact.  Skin: No rashes or lesions.  No erythema.  GU/Rectal/Breasts: Not indicated for surgery.    X-RAYS: No images obtained today.  See MRI results above.   IMPRESSION: 1. Right rotator cuff tear.   2. Diabetes. 3. High cholesterol.  4. Hypertension. 5. Sleep apnea. 6. History of MI with stents.    PLAN: We have discussed the risks and benefits of right shoulder surgery.  Plan for right shoulder arthroscopy, acromioplasty and possible open rotator cuff repair.  He has obtained cardiac clearance from his cardiologist, Dr. Dorena Cookey, stating it is okay to stop Plavix five days prior to surgery.  He should resume his baby Aspirin daily.  Preadmission appointment scheduled for April 10, 2012.  Surgery is scheduled for April 17, 2012.  If he has any questions or concerns prior to surgery he may contact the office.    Praise Dolecki A.  Mande Auvil, PA-C

## 2012-04-17 NOTE — Anesthesia Postprocedure Evaluation (Signed)
  Anesthesia Post-op Note  Patient: Matthew Campos  Procedure(s) Performed: Procedure(s) (LRB) with comments: SHOULDER ARTHROSCOPY WITH ROTATOR CUFF REPAIR AND SUBACROMIAL DECOMPRESSION (Right) - RIGHT SHOULDER ROTATOR CUFF REPAIR INCLUDING ACROMIOPLASTY CHRONIC, ARTHROSCOPY SHOULDER DEBRIDEMENT EXTENSIVE  Patient Location: PACU  Anesthesia Type:General and GA combined with regional for post-op pain  Level of Consciousness: awake  Airway and Oxygen Therapy: Patient Spontanous Breathing  Post-op Pain: none  Post-op Assessment: Post-op Vital signs reviewed  Post-op Vital Signs: stable  Complications: No apparent anesthesia complications

## 2012-04-20 ENCOUNTER — Encounter (HOSPITAL_COMMUNITY): Payer: Self-pay | Admitting: Orthopedic Surgery

## 2012-04-23 MED ORDER — BUPIVACAINE-EPINEPHRINE PF 0.5-1:200000 % IJ SOLN
INTRAMUSCULAR | Status: DC | PRN
  Administered 2012-04-17: 30 mL

## 2012-04-23 NOTE — Anesthesia Procedure Notes (Signed)
Anesthesia Regional Block:  Interscalene brachial plexus block  Pre-Anesthetic Checklist: ,, timeout performed, Correct Patient, Correct Site, Correct Laterality, Correct Procedure, Correct Position, site marked, Risks and benefits discussed,  Surgical consent,  Pre-op evaluation,  At surgeon's request and post-op pain management  Laterality: Right  Prep: chloraprep       Needles:  Injection technique: Single-shot  Needle Type: Echogenic Stimulator Needle     Needle Length: 5cm 5 cm Needle Gauge: 22 and 22 G    Additional Needles:  Procedures: ultrasound guided (picture in chart) and nerve stimulator Interscalene brachial plexus block  Nerve Stimulator or Paresthesia:  Response: biceps flexion, 0.45 mA,   Additional Responses:   Narrative:  Start time: 04/17/2012 9:50 AM End time: 04/17/2012 10:04 AM Injection made incrementally with aspirations every 5 mL.  Performed by: Personally  Anesthesiologist: Dr Chaney Malling  Additional Notes: Functioning IV was confirmed and monitors were applied.  A 50mm 22ga Arrow echogenic stimulator needle was used. Sterile prep and drape,hand hygiene and sterile gloves were used.  Negative aspiration and negative test dose prior to incremental administration of local anesthetic. The patient tolerated the procedure well.  Ultrasound guidance: relevent anatomy identified, needle position confirmed, local anesthetic spread visualized around nerve(s), vascular puncture avoided.  Image printed for medical record.   Interscalene brachial plexus block

## 2012-04-23 NOTE — Addendum Note (Signed)
Addendum  created 04/23/12 1520 by Raiford Simmonds, MD   Modules edited:Anesthesia Blocks and Procedures, Anesthesia Medication Administration, Inpatient Notes

## 2012-04-23 NOTE — Addendum Note (Signed)
Addendum  created 04/23/12 1521 by Raiford Simmonds, MD   Modules edited:Anesthesia Blocks and Procedures, Anesthesia Medication Administration, Inpatient Notes

## 2012-04-27 ENCOUNTER — Ambulatory Visit: Payer: BC Managed Care – PPO | Attending: Orthopedic Surgery | Admitting: Physical Therapy

## 2012-04-27 DIAGNOSIS — M25619 Stiffness of unspecified shoulder, not elsewhere classified: Secondary | ICD-10-CM | POA: Insufficient documentation

## 2012-04-27 DIAGNOSIS — M25519 Pain in unspecified shoulder: Secondary | ICD-10-CM | POA: Insufficient documentation

## 2012-04-27 DIAGNOSIS — R5381 Other malaise: Secondary | ICD-10-CM | POA: Insufficient documentation

## 2012-05-04 ENCOUNTER — Ambulatory Visit: Payer: BC Managed Care – PPO | Admitting: *Deleted

## 2012-05-11 ENCOUNTER — Ambulatory Visit: Payer: BC Managed Care – PPO | Admitting: Physical Therapy

## 2012-05-18 ENCOUNTER — Ambulatory Visit: Payer: BC Managed Care – PPO | Attending: Orthopedic Surgery | Admitting: *Deleted

## 2012-05-18 DIAGNOSIS — M25619 Stiffness of unspecified shoulder, not elsewhere classified: Secondary | ICD-10-CM | POA: Insufficient documentation

## 2012-05-18 DIAGNOSIS — R5381 Other malaise: Secondary | ICD-10-CM | POA: Insufficient documentation

## 2012-05-18 DIAGNOSIS — M25519 Pain in unspecified shoulder: Secondary | ICD-10-CM | POA: Insufficient documentation

## 2012-05-25 ENCOUNTER — Ambulatory Visit: Payer: BC Managed Care – PPO | Admitting: *Deleted

## 2012-05-27 ENCOUNTER — Ambulatory Visit: Payer: BC Managed Care – PPO | Admitting: *Deleted

## 2012-06-01 ENCOUNTER — Ambulatory Visit: Payer: BC Managed Care – PPO | Admitting: *Deleted

## 2012-06-03 ENCOUNTER — Ambulatory Visit: Payer: BC Managed Care – PPO | Admitting: Physical Therapy

## 2012-06-08 ENCOUNTER — Ambulatory Visit: Payer: BC Managed Care – PPO | Admitting: Physical Therapy

## 2012-06-11 ENCOUNTER — Ambulatory Visit: Payer: BC Managed Care – PPO | Admitting: Physical Therapy

## 2012-06-15 ENCOUNTER — Encounter: Payer: BC Managed Care – PPO | Admitting: Physical Therapy

## 2012-06-18 ENCOUNTER — Encounter: Payer: BC Managed Care – PPO | Admitting: Physical Therapy

## 2012-06-18 ENCOUNTER — Ambulatory Visit: Payer: BC Managed Care – PPO | Attending: Orthopedic Surgery | Admitting: Physical Therapy

## 2012-06-18 DIAGNOSIS — M25519 Pain in unspecified shoulder: Secondary | ICD-10-CM | POA: Insufficient documentation

## 2012-06-18 DIAGNOSIS — R5381 Other malaise: Secondary | ICD-10-CM | POA: Insufficient documentation

## 2012-06-18 DIAGNOSIS — M25619 Stiffness of unspecified shoulder, not elsewhere classified: Secondary | ICD-10-CM | POA: Insufficient documentation

## 2012-06-23 ENCOUNTER — Ambulatory Visit: Payer: BC Managed Care – PPO | Admitting: Physical Therapy

## 2012-06-26 ENCOUNTER — Ambulatory Visit: Payer: BC Managed Care – PPO | Admitting: Physical Therapy

## 2012-06-30 ENCOUNTER — Ambulatory Visit: Payer: BC Managed Care – PPO | Admitting: Physical Therapy

## 2012-07-02 ENCOUNTER — Ambulatory Visit: Payer: BC Managed Care – PPO | Admitting: Physical Therapy

## 2012-07-06 ENCOUNTER — Ambulatory Visit: Payer: BC Managed Care – PPO | Admitting: Physical Therapy

## 2012-07-09 ENCOUNTER — Ambulatory Visit: Payer: BC Managed Care – PPO | Admitting: Physical Therapy

## 2013-09-09 DIAGNOSIS — E785 Hyperlipidemia, unspecified: Secondary | ICD-10-CM | POA: Insufficient documentation

## 2013-09-09 DIAGNOSIS — E119 Type 2 diabetes mellitus without complications: Secondary | ICD-10-CM | POA: Insufficient documentation

## 2014-11-15 ENCOUNTER — Ambulatory Visit: Payer: Medicare HMO | Admitting: Physical Therapy

## 2015-10-19 DIAGNOSIS — I714 Abdominal aortic aneurysm, without rupture, unspecified: Secondary | ICD-10-CM | POA: Insufficient documentation

## 2015-12-27 DIAGNOSIS — K219 Gastro-esophageal reflux disease without esophagitis: Secondary | ICD-10-CM | POA: Insufficient documentation

## 2015-12-27 DIAGNOSIS — J449 Chronic obstructive pulmonary disease, unspecified: Secondary | ICD-10-CM | POA: Insufficient documentation

## 2015-12-27 DIAGNOSIS — I2510 Atherosclerotic heart disease of native coronary artery without angina pectoris: Secondary | ICD-10-CM | POA: Insufficient documentation

## 2015-12-27 DIAGNOSIS — I10 Essential (primary) hypertension: Secondary | ICD-10-CM | POA: Insufficient documentation

## 2015-12-27 DIAGNOSIS — E1159 Type 2 diabetes mellitus with other circulatory complications: Secondary | ICD-10-CM | POA: Insufficient documentation

## 2016-07-09 ENCOUNTER — Other Ambulatory Visit (HOSPITAL_COMMUNITY): Payer: Self-pay | Admitting: Family Medicine

## 2016-07-09 ENCOUNTER — Ambulatory Visit (HOSPITAL_COMMUNITY)
Admission: RE | Admit: 2016-07-09 | Discharge: 2016-07-09 | Disposition: A | Discharge status: Home / Self Care | Payer: Medicare HMO | Source: Ambulatory Visit | Attending: Family Medicine | Admitting: Family Medicine

## 2016-07-09 DIAGNOSIS — J40 Bronchitis, not specified as acute or chronic: Secondary | ICD-10-CM | POA: Insufficient documentation

## 2016-07-09 DIAGNOSIS — I700 Atherosclerosis of aorta: Secondary | ICD-10-CM | POA: Insufficient documentation

## 2017-02-10 ENCOUNTER — Institutional Professional Consult (permissible substitution): Payer: Medicare Other | Admitting: Pulmonary Disease

## 2017-02-13 ENCOUNTER — Institutional Professional Consult (permissible substitution): Payer: Medicare Other | Admitting: Pulmonary Disease

## 2017-04-24 ENCOUNTER — Ambulatory Visit (HOSPITAL_COMMUNITY)
Admission: RE | Admit: 2017-04-24 | Discharge: 2017-04-24 | Disposition: A | Discharge status: Home / Self Care | Payer: Medicare HMO | Source: Ambulatory Visit | Attending: Family Medicine | Admitting: Family Medicine

## 2017-04-24 ENCOUNTER — Other Ambulatory Visit (HOSPITAL_COMMUNITY): Payer: Self-pay | Admitting: Family Medicine

## 2017-04-24 DIAGNOSIS — J189 Pneumonia, unspecified organism: Secondary | ICD-10-CM | POA: Diagnosis present

## 2017-04-24 DIAGNOSIS — I700 Atherosclerosis of aorta: Secondary | ICD-10-CM | POA: Diagnosis not present

## 2017-04-28 ENCOUNTER — Other Ambulatory Visit (INDEPENDENT_AMBULATORY_CARE_PROVIDER_SITE_OTHER): Payer: Medicare HMO

## 2017-04-28 ENCOUNTER — Ambulatory Visit: Payer: Medicare HMO | Admitting: Pulmonary Disease

## 2017-04-28 ENCOUNTER — Telehealth: Payer: Self-pay | Admitting: Pulmonary Disease

## 2017-04-28 ENCOUNTER — Encounter: Payer: Self-pay | Admitting: Pulmonary Disease

## 2017-04-28 VITALS — BP 128/60 | HR 53 | Ht 68.0 in | Wt 224.2 lb

## 2017-04-28 DIAGNOSIS — R0602 Shortness of breath: Secondary | ICD-10-CM | POA: Diagnosis not present

## 2017-04-28 LAB — CBC WITH DIFFERENTIAL/PLATELET
BASOS PCT: 0.5 % (ref 0.0–3.0)
Basophils Absolute: 0 10*3/uL (ref 0.0–0.1)
EOS ABS: 0.3 10*3/uL (ref 0.0–0.7)
Eosinophils Relative: 3.7 % (ref 0.0–5.0)
HEMATOCRIT: 40.2 % (ref 39.0–52.0)
Hemoglobin: 13.2 g/dL (ref 13.0–17.0)
LYMPHS ABS: 1.6 10*3/uL (ref 0.7–4.0)
LYMPHS PCT: 24 % (ref 12.0–46.0)
MCHC: 32.7 g/dL (ref 30.0–36.0)
MCV: 94.4 fl (ref 78.0–100.0)
MONO ABS: 0.5 10*3/uL (ref 0.1–1.0)
Monocytes Relative: 7.8 % (ref 3.0–12.0)
NEUTROS ABS: 4.4 10*3/uL (ref 1.4–7.7)
NEUTROS PCT: 64 % (ref 43.0–77.0)
Platelets: 142 10*3/uL — ABNORMAL LOW (ref 150.0–400.0)
RBC: 4.26 Mil/uL (ref 4.22–5.81)
RDW: 14.1 % (ref 11.5–15.5)
WBC: 6.8 10*3/uL (ref 4.0–10.5)

## 2017-04-28 NOTE — Telephone Encounter (Signed)
DPR was completed listing Dr. [name], PCP, and [name] (wife) but was NOT SIGNED by the patient.

## 2017-04-28 NOTE — Progress Notes (Signed)
Matthew Campos    [id number]    07-24-1940  Primary Care Physician:Nyland, Hollice Espy, MD  Referring Physician: Dione Housekeeper, MD [address] [place], [place] [address]  Chief complaint: Consult for COPD  HPI: 77 year old with history of coronary artery disease, hyperlipidemia, diabetes, COPD.   He was hospitalized at [facility] in November 2018 for left lower lobe pneumonia.  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 He was sent to [facility] for management.  He reports having pneumonia every year.  He has been told he has COPD but no PFTs on record. He is currently maintained on Symbicort and albuterol rescue inhaler which he uses 2-3 times a day.  He was diagnosed with OSA 5 years ago and was placed on CPAP.  However he cannot tolerated due to coughing.  He is on supplemental oxygen at night and does not want to retry the CPAP.  Pets: Dogs, [place] pigs.  He does not have any exposure to birds or farm animals Occupation: Retired Education officer, museum.  He had been exposed to wood dust Exposures: Exposed to wood smoke at home which he uses for heating.  Reports multiple leaks and possible mold in his den.  He reports worsening shortness of breath when he goes to his den. Smoking history: 30-60-pack-year smoking history.  Quit in 1988 Travel History: Not significant  Outpatient Encounter Medications as of 04/28/2017  Medication Sig  . albuterol (PROVENTIL HFA;VENTOLIN HFA) 108 (90 Base) MCG/ACT inhaler Inhale 2 puffs into the lungs every 6 (six) hours as needed for wheezing or shortness of breath.  [name] albuterol (PROVENTIL) (2.5 MG/3ML) 0.083% nebulizer solution Take 2.5 mg by nebulization every 6 (six) hours as needed for wheezing or shortness of breath.  [name] aspirin 81 MG chewable tablet Chew 81 mg by mouth daily.  .  budesonide-formoterol (SYMBICORT) 80-4.5 MCG/ACT inhaler Inhale 2 puffs into the lungs 2 (two) times daily.  . clopidogrel (PLAVIX) 75 MG tablet Take 75 mg by mouth daily.   [name] glipiZIDE (GLUCOTROL) 10 MG tablet Take 10 mg by mouth 2 (two) times daily before a meal.  . LORazepam (ATIVAN) 2 MG tablet Take 2 mg by mouth at bedtime as needed for anxiety.  [name] losartan (COZAAR) 100 MG tablet Take 100 mg by mouth daily.   . metFORMIN (GLUCOPHAGE) 1000 MG tablet Take 500-1,000 mg by mouth 2 (two) times daily with a meal. 1000mg  in the morning; 500mg  in the evening  . metoprolol (LOPRESSOR) 100 MG tablet Take 100 mg by mouth daily.  . Multiple Vitamin (MULTIVITAMIN WITH MINERALS) TABS Take 1 tablet by mouth daily.  . nitroGLYCERIN (NITROSTAT) 0.4 MG SL tablet Place 0.4 mg under the tongue every 5 (five) minutes as needed. For chest pain  . rosuvastatin (CRESTOR) 20 MG tablet Take 20 mg by mouth daily.  . sitaGLIPtin (JANUVIA) 100 MG tablet Take 50 mg by mouth daily.  [name] dexlansoprazole (DEXILANT) 60 MG capsule Take 60 mg by mouth daily.  . diazepam (VALIUM) 10 MG tablet Take 10 mg by mouth at bedtime as needed. For sleep  . fish oil-omega-3 fatty acids 1000 MG capsule Take 1 g by mouth 2 (two) times daily.  . [DISCONTINUED] atorvastatin (LIPITOR) 80 MG tablet Take 80 mg by mouth daily.    No facility-administered encounter medications on file  as of 04/28/2017.     Allergies as of 04/28/2017 - Review Complete 04/28/2017  Allergen Reaction Noted  . Codeine Nausea And Vomiting 04/10/2012  . Latex Hives and Itching 04/02/2012    Past Medical History:  Diagnosis Date  . Complication of anesthesia    hard to be put to sleep  . Coronary artery disease   . Diabetes mellitus without complication    takes Januvia and Metformin daily  . GERD (gastroesophageal reflux disease)    takes Dexilant daily  . Headache    sinus  . History of bronchitis    last time >53yrs ago  . Hyperlipidemia    takes Lipitor  daily  . Hypertension    takes Metoprolol and Losartan daily  . Joint pain   . Myocardial infarction ([place])    x 3;last one about 3-30yrs ago  . Pneumonia    last itme about 24yrs ago    Past Surgical History:  Procedure Laterality Date  . abdominal aneurysm stenting    . CORONARY ANGIOPLASTY  2010  . cyst removed from left wrist    . HERNIA REPAIR    . left shoulder surgery    . SHOULDER ARTHROSCOPY WITH ROTATOR CUFF REPAIR AND SUBACROMIAL DECOMPRESSION  04/17/2012   Procedure: SHOULDER ARTHROSCOPY WITH ROTATOR CUFF REPAIR AND SUBACROMIAL DECOMPRESSION;  Surgeon: Yvette Rack., MD;  Location: [facility];  Service: Orthopedics;  Laterality: Right;  RIGHT SHOULDER ROTATOR CUFF REPAIR INCLUDING ACROMIOPLASTY CHRONIC, ARTHROSCOPY SHOULDER DEBRIDEMENT EXTENSIVE  . TONSILLECTOMY      No family history on file.  Social History   Socioeconomic History  . Marital status: Married    Spouse name: Not on file  . Number of children: Not on file  . Years of education: Not on file  . Highest education level: Not on file  Social Needs  . Financial resource strain: Not on file  . Food insecurity - worry: Not on file  . Food insecurity - inability: Not on file  . Transportation needs - medical: Not on file  . Transportation needs - non-medical: Not on file  Occupational History  . Not on file  Tobacco Use  . Smoking status: Not on file  . Tobacco comment: quit smoking 20+yrs ago  Substance and Sexual Activity  . Alcohol use: No  . Drug use: No  . Sexual activity: Yes  Other Topics Concern  . Not on file  Social History Narrative  . Not on file    Review of systems: Review of Systems  Constitutional: Negative for fever and chills.  HENT: Negative.   Eyes: Negative for blurred vision.  Respiratory: as per HPI  Cardiovascular: Negative for chest pain and palpitations.  Gastrointestinal: Negative for vomiting, diarrhea, blood per rectum. Genitourinary: Negative for dysuria, urgency,  frequency and hematuria.  Musculoskeletal: Negative for myalgias, back pain and joint pain.  Skin: Negative for itching and rash.  Neurological: Negative for dizziness, tremors, focal weakness, seizures and loss of consciousness.  Endo/Heme/Allergies: Negative for environmental allergies.  Psychiatric/Behavioral: Negative for depression, suicidal ideas and hallucinations.  All other systems reviewed and are negative.  Physical Exam: Blood pressure 128/60, pulse (!) 53, height 5\' 8"  (1.727 m), weight 224 lb 3.2 oz (101.7 kg), SpO2 95 %. Gen:      No acute distress HEENT:  EOMI, sclera anicteric Neck:     No masses; no thyromegaly Lungs:    Clear to auscultation bilaterally; normal respiratory effort CV:  Regular rate and rhythm; no murmurs Abd:      + bowel sounds; soft, non-tender; no palpable masses, no distension Ext:    No edema; adequate peripheral perfusion Skin:      Warm and dry; no rash Neuro: alert and oriented x 3 Psych: normal mood and affect  Data Reviewed: CT chest 08/30/14-mild basilar atelectasis, right upper lobe bleb. Chest x-ray 03/06/17-left lower lobe infiltrate. Chest x-ray 04/24/17-resolution of left lower lobe infiltrate I have reviewed the images personally.  Assessment:  Admission for left lower lobe pneumonia Follow-up chest x-ray shows resolution of the left lower lobe infiltrate with no acute abnormality. He has history of COPD.  We will get Pulmonary function test for better evaluation Reports possible exposure to mold at home-check CBC with diff, blood allergy profile, IgE and Aspergillus antibodies.  Continue Symbicort and albuterol for now.  Follow up in 1 month  OSA Declines CPAP.  He continues on oxygen at night.  Plan/Recommendations: - Pulmonary function test - CBC differential, blood allergy profile, aspergillus antibodies - Continue Symbicort, albuterol.  Marshell Garfinkel MD [facility] Pager [phone] 04/28/2017, 11:16 AM  CC: Dione Housekeeper, MD

## 2017-04-28 NOTE — Patient Instructions (Signed)
We will check pulmonary function test, hypersensitivity pneumonitis panel, blood allergy profile, aspergillus antibodies Continue using the Symbicort Follow-up in 1-2 months.

## 2017-04-29 LAB — RESPIRATORY ALLERGY PROFILE REGION II ~~LOC~~
Allergen, Cottonwood, t14: <0.1 kU/L
Bermuda Grass: <0.1 kU/L
Box Elder IgE: <0.1 kU/L
CLASS: 0
CLASS: 0
CLASS: 0
CLASS: 0
CLASS: 0
CLASS: 0
CLASS: 0
CLASS: 0
CLASS: 0
CLASS: 0
CLASS: 0
CLASS: 0
CLASS: 0
CLASS: 0
COMMON RAGWEED (SHORT) (W1) IGE: <0.1 kU/L
Class: 0
Class: 0
Class: 0
Class: 0
Class: 0
Class: 0
Class: 0
Class: 0
Class: 0
Class: 0
Cockroach: <0.1 kU/L
Dog Dander: <0.1 kU/L
Elm IgE: <0.1 kU/L
IgE (Immunoglobulin E), Serum: 82 kU/L (ref <=114)
Johnson Grass: <0.1 kU/L
Sheep Sorrel IgE: <0.1 kU/L

## 2017-04-29 LAB — INTERPRETATION:

## 2017-04-29 LAB — ALLERGY PANEL 11, MOLD GROUP
Allergen, A. alternata, m6: <0.1 kU/L
CLADOSPORIUM HERBARUM (M2) IGE: <0.1 kU/L
CLASS: 0
CLASS: 0
CLASS: 0
CLASS: 0
Candida Albicans: <0.1 kU/L
Class: 0

## 2017-07-07 ENCOUNTER — Ambulatory Visit: Payer: Medicare HMO | Admitting: Pulmonary Disease

## 2017-07-21 DIAGNOSIS — D696 Thrombocytopenia, unspecified: Secondary | ICD-10-CM | POA: Insufficient documentation

## 2017-07-21 DIAGNOSIS — F322 Major depressive disorder, single episode, severe without psychotic features: Secondary | ICD-10-CM | POA: Insufficient documentation

## 2017-09-22 ENCOUNTER — Other Ambulatory Visit (HOSPITAL_COMMUNITY): Payer: Self-pay | Admitting: Family Medicine

## 2017-09-22 ENCOUNTER — Ambulatory Visit (HOSPITAL_COMMUNITY)
Admission: RE | Admit: 2017-09-22 | Discharge: 2017-09-22 | Disposition: A | Discharge status: Home / Self Care | Payer: Medicare HMO | Source: Ambulatory Visit | Attending: Family Medicine | Admitting: Family Medicine

## 2017-09-22 DIAGNOSIS — M5441 Lumbago with sciatica, right side: Secondary | ICD-10-CM

## 2017-09-22 DIAGNOSIS — M5136 Other intervertebral disc degeneration, lumbar region: Secondary | ICD-10-CM | POA: Insufficient documentation

## 2017-09-22 DIAGNOSIS — M545 Low back pain: Secondary | ICD-10-CM | POA: Diagnosis present

## 2017-12-10 LAB — MICROALBUMIN / CREATININE URINE RATIO: Microalb Creat Ratio: 29.6

## 2018-04-16 DIAGNOSIS — I110 Hypertensive heart disease with heart failure: Secondary | ICD-10-CM | POA: Diagnosis not present

## 2018-04-16 DIAGNOSIS — I959 Hypotension, unspecified: Secondary | ICD-10-CM | POA: Diagnosis not present

## 2018-04-16 DIAGNOSIS — J810 Acute pulmonary edema: Secondary | ICD-10-CM | POA: Diagnosis not present

## 2018-04-16 DIAGNOSIS — I130 Hypertensive heart and chronic kidney disease with heart failure and stage 1 through stage 4 chronic kidney disease, or unspecified chronic kidney disease: Secondary | ICD-10-CM | POA: Diagnosis not present

## 2018-04-16 DIAGNOSIS — R0902 Hypoxemia: Secondary | ICD-10-CM | POA: Diagnosis not present

## 2018-04-16 DIAGNOSIS — J440 Chronic obstructive pulmonary disease with acute lower respiratory infection: Secondary | ICD-10-CM | POA: Diagnosis not present

## 2018-04-16 DIAGNOSIS — J209 Acute bronchitis, unspecified: Secondary | ICD-10-CM | POA: Diagnosis not present

## 2018-04-16 DIAGNOSIS — E1122 Type 2 diabetes mellitus with diabetic chronic kidney disease: Secondary | ICD-10-CM | POA: Diagnosis not present

## 2018-04-16 DIAGNOSIS — E119 Type 2 diabetes mellitus without complications: Secondary | ICD-10-CM | POA: Diagnosis not present

## 2018-04-16 DIAGNOSIS — I440 Atrioventricular block, first degree: Secondary | ICD-10-CM | POA: Diagnosis not present

## 2018-04-16 DIAGNOSIS — Z7984 Long term (current) use of oral hypoglycemic drugs: Secondary | ICD-10-CM | POA: Diagnosis not present

## 2018-04-16 DIAGNOSIS — I2510 Atherosclerotic heart disease of native coronary artery without angina pectoris: Secondary | ICD-10-CM | POA: Diagnosis not present

## 2018-04-16 DIAGNOSIS — R0602 Shortness of breath: Secondary | ICD-10-CM | POA: Diagnosis not present

## 2018-04-16 DIAGNOSIS — N179 Acute kidney failure, unspecified: Secondary | ICD-10-CM | POA: Diagnosis not present

## 2018-04-16 DIAGNOSIS — J9601 Acute respiratory failure with hypoxia: Secondary | ICD-10-CM | POA: Diagnosis not present

## 2018-04-16 DIAGNOSIS — E785 Hyperlipidemia, unspecified: Secondary | ICD-10-CM | POA: Diagnosis not present

## 2018-04-16 DIAGNOSIS — R69 Illness, unspecified: Secondary | ICD-10-CM | POA: Diagnosis not present

## 2018-04-16 DIAGNOSIS — I5033 Acute on chronic diastolic (congestive) heart failure: Secondary | ICD-10-CM | POA: Diagnosis not present

## 2018-04-16 DIAGNOSIS — J9611 Chronic respiratory failure with hypoxia: Secondary | ICD-10-CM | POA: Diagnosis not present

## 2018-04-16 DIAGNOSIS — Z951 Presence of aortocoronary bypass graft: Secondary | ICD-10-CM | POA: Diagnosis not present

## 2018-04-16 DIAGNOSIS — N189 Chronic kidney disease, unspecified: Secondary | ICD-10-CM | POA: Diagnosis not present

## 2018-04-20 MED ORDER — ROSUVASTATIN CALCIUM 20 MG PO TABS
20.00 | ORAL_TABLET | ORAL | Status: DC
Start: 2018-04-21 — End: 2018-04-20

## 2018-04-20 MED ORDER — ENOXAPARIN SODIUM 40 MG/0.4ML ~~LOC~~ SOLN
40.00 | SUBCUTANEOUS | Status: DC
Start: 2018-04-21 — End: 2018-04-20

## 2018-04-20 MED ORDER — BUDESONIDE 0.5 MG/2ML IN SUSP
0.50 | RESPIRATORY_TRACT | Status: DC
Start: 2018-04-20 — End: 2018-04-20

## 2018-04-20 MED ORDER — METOPROLOL TARTRATE 50 MG PO TABS
50.00 | ORAL_TABLET | ORAL | Status: DC
Start: 2018-04-20 — End: 2018-04-20

## 2018-04-20 MED ORDER — BISACODYL 5 MG PO TBEC
10.00 | DELAYED_RELEASE_TABLET | ORAL | Status: DC
Start: ? — End: 2018-04-20

## 2018-04-20 MED ORDER — LORAZEPAM 0.5 MG PO TABS
.50 | ORAL_TABLET | ORAL | Status: DC
Start: ? — End: 2018-04-20

## 2018-04-20 MED ORDER — GUAIFENESIN ER 600 MG PO TB12
600.00 | ORAL_TABLET | ORAL | Status: DC
Start: 2018-04-20 — End: 2018-04-20

## 2018-04-20 MED ORDER — ALBUTEROL SULFATE (2.5 MG/3ML) 0.083% IN NEBU
2.50 | INHALATION_SOLUTION | RESPIRATORY_TRACT | Status: DC
Start: ? — End: 2018-04-20

## 2018-04-20 MED ORDER — IPRATROPIUM-ALBUTEROL 0.5-2.5 (3) MG/3ML IN SOLN
3.00 | RESPIRATORY_TRACT | Status: DC
Start: 2018-04-20 — End: 2018-04-20

## 2018-04-20 MED ORDER — DEXTROSE 10 % IV SOLN
125.00 | INTRAVENOUS | Status: DC
Start: ? — End: 2018-04-20

## 2018-04-20 MED ORDER — CLOPIDOGREL BISULFATE 75 MG PO TABS
75.00 | ORAL_TABLET | ORAL | Status: DC
Start: 2018-04-21 — End: 2018-04-20

## 2018-04-20 MED ORDER — ARFORMOTEROL TARTRATE 15 MCG/2ML IN NEBU
15.00 | INHALATION_SOLUTION | RESPIRATORY_TRACT | Status: DC
Start: 2018-04-20 — End: 2018-04-20

## 2018-04-20 MED ORDER — INSULIN GLARGINE 100 UNIT/ML SOLOSTAR PEN
25.00 | PEN_INJECTOR | SUBCUTANEOUS | Status: DC
Start: 2018-04-20 — End: 2018-04-20

## 2018-04-20 MED ORDER — FUROSEMIDE 40 MG PO TABS
40.00 | ORAL_TABLET | ORAL | Status: DC
Start: 2018-04-21 — End: 2018-04-20

## 2018-04-20 MED ORDER — INSULIN LISPRO 100 UNIT/ML ~~LOC~~ SOLN
2.00 | SUBCUTANEOUS | Status: DC
Start: 2018-04-20 — End: 2018-04-20

## 2018-04-20 MED ORDER — ESCITALOPRAM OXALATE 10 MG PO TABS
10.00 | ORAL_TABLET | ORAL | Status: DC
Start: 2018-04-21 — End: 2018-04-20

## 2018-04-20 MED ORDER — ASPIRIN EC 81 MG PO TBEC
81.00 | DELAYED_RELEASE_TABLET | ORAL | Status: DC
Start: 2018-04-21 — End: 2018-04-20

## 2018-04-20 MED ORDER — PANTOPRAZOLE SODIUM 40 MG PO TBEC
40.00 | DELAYED_RELEASE_TABLET | ORAL | Status: DC
Start: 2018-04-21 — End: 2018-04-20

## 2018-04-25 DIAGNOSIS — R069 Unspecified abnormalities of breathing: Secondary | ICD-10-CM | POA: Diagnosis not present

## 2018-04-25 DIAGNOSIS — R001 Bradycardia, unspecified: Secondary | ICD-10-CM | POA: Diagnosis not present

## 2018-04-25 DIAGNOSIS — R0602 Shortness of breath: Secondary | ICD-10-CM | POA: Diagnosis not present

## 2018-04-25 DIAGNOSIS — R42 Dizziness and giddiness: Secondary | ICD-10-CM | POA: Diagnosis not present

## 2018-04-30 DIAGNOSIS — F419 Anxiety disorder, unspecified: Secondary | ICD-10-CM | POA: Diagnosis not present

## 2018-04-30 DIAGNOSIS — E1159 Type 2 diabetes mellitus with other circulatory complications: Secondary | ICD-10-CM | POA: Diagnosis not present

## 2018-04-30 DIAGNOSIS — J449 Chronic obstructive pulmonary disease, unspecified: Secondary | ICD-10-CM | POA: Diagnosis not present

## 2018-04-30 DIAGNOSIS — I5032 Chronic diastolic (congestive) heart failure: Secondary | ICD-10-CM | POA: Diagnosis not present

## 2018-04-30 DIAGNOSIS — I10 Essential (primary) hypertension: Secondary | ICD-10-CM | POA: Diagnosis not present

## 2018-04-30 DIAGNOSIS — R69 Illness, unspecified: Secondary | ICD-10-CM | POA: Diagnosis not present

## 2018-05-03 DIAGNOSIS — J449 Chronic obstructive pulmonary disease, unspecified: Secondary | ICD-10-CM | POA: Diagnosis not present

## 2018-05-16 DIAGNOSIS — 419620001 Death: Secondary | SNOMED CT | POA: Diagnosis not present

## 2018-05-16 DEATH — deceased

## 2018-05-20 DIAGNOSIS — R0602 Shortness of breath: Secondary | ICD-10-CM | POA: Diagnosis not present

## 2018-05-20 DIAGNOSIS — Z955 Presence of coronary angioplasty implant and graft: Secondary | ICD-10-CM | POA: Diagnosis not present

## 2018-05-20 DIAGNOSIS — I209 Angina pectoris, unspecified: Secondary | ICD-10-CM | POA: Diagnosis not present

## 2018-05-20 DIAGNOSIS — R42 Dizziness and giddiness: Secondary | ICD-10-CM | POA: Diagnosis not present

## 2018-05-20 DIAGNOSIS — I219 Acute myocardial infarction, unspecified: Secondary | ICD-10-CM | POA: Diagnosis not present

## 2018-05-20 DIAGNOSIS — E119 Type 2 diabetes mellitus without complications: Secondary | ICD-10-CM | POA: Diagnosis not present

## 2018-05-20 DIAGNOSIS — I440 Atrioventricular block, first degree: Secondary | ICD-10-CM | POA: Diagnosis not present

## 2018-05-20 DIAGNOSIS — I259 Chronic ischemic heart disease, unspecified: Secondary | ICD-10-CM | POA: Diagnosis not present

## 2018-05-20 DIAGNOSIS — E7800 Pure hypercholesterolemia, unspecified: Secondary | ICD-10-CM | POA: Diagnosis not present

## 2018-05-20 DIAGNOSIS — Z8679 Personal history of other diseases of the circulatory system: Secondary | ICD-10-CM | POA: Diagnosis not present

## 2018-05-20 DIAGNOSIS — Z79899 Other long term (current) drug therapy: Secondary | ICD-10-CM | POA: Diagnosis not present

## 2018-05-20 DIAGNOSIS — I2510 Atherosclerotic heart disease of native coronary artery without angina pectoris: Secondary | ICD-10-CM | POA: Diagnosis not present

## 2018-05-20 DIAGNOSIS — Z9889 Other specified postprocedural states: Secondary | ICD-10-CM | POA: Diagnosis not present

## 2018-05-20 DIAGNOSIS — I350 Nonrheumatic aortic (valve) stenosis: Secondary | ICD-10-CM | POA: Diagnosis not present

## 2018-05-20 DIAGNOSIS — I714 Abdominal aortic aneurysm, without rupture: Secondary | ICD-10-CM | POA: Diagnosis not present

## 2018-05-25 DIAGNOSIS — R69 Illness, unspecified: Secondary | ICD-10-CM | POA: Diagnosis not present

## 2018-05-28 DIAGNOSIS — I714 Abdominal aortic aneurysm, without rupture: Secondary | ICD-10-CM | POA: Diagnosis not present

## 2018-06-02 DIAGNOSIS — H5203 Hypermetropia, bilateral: Secondary | ICD-10-CM | POA: Diagnosis not present

## 2018-06-02 DIAGNOSIS — H524 Presbyopia: Secondary | ICD-10-CM | POA: Diagnosis not present

## 2018-06-02 DIAGNOSIS — H52223 Regular astigmatism, bilateral: Secondary | ICD-10-CM | POA: Diagnosis not present

## 2018-06-03 DIAGNOSIS — J449 Chronic obstructive pulmonary disease, unspecified: Secondary | ICD-10-CM | POA: Diagnosis not present

## 2018-06-08 DIAGNOSIS — E119 Type 2 diabetes mellitus without complications: Secondary | ICD-10-CM | POA: Diagnosis not present

## 2018-06-08 DIAGNOSIS — H2511 Age-related nuclear cataract, right eye: Secondary | ICD-10-CM | POA: Diagnosis not present

## 2018-06-11 DIAGNOSIS — R69 Illness, unspecified: Secondary | ICD-10-CM | POA: Diagnosis not present

## 2018-06-11 DIAGNOSIS — E785 Hyperlipidemia, unspecified: Secondary | ICD-10-CM | POA: Diagnosis not present

## 2018-06-11 DIAGNOSIS — D696 Thrombocytopenia, unspecified: Secondary | ICD-10-CM | POA: Diagnosis not present

## 2018-06-11 DIAGNOSIS — E1121 Type 2 diabetes mellitus with diabetic nephropathy: Secondary | ICD-10-CM | POA: Diagnosis not present

## 2018-06-11 DIAGNOSIS — F322 Major depressive disorder, single episode, severe without psychotic features: Secondary | ICD-10-CM | POA: Diagnosis not present

## 2018-06-11 DIAGNOSIS — I10 Essential (primary) hypertension: Secondary | ICD-10-CM | POA: Diagnosis not present

## 2018-06-11 DIAGNOSIS — E1159 Type 2 diabetes mellitus with other circulatory complications: Secondary | ICD-10-CM | POA: Diagnosis not present

## 2018-06-11 DIAGNOSIS — Z6835 Body mass index (BMI) 35.0-35.9, adult: Secondary | ICD-10-CM | POA: Diagnosis not present

## 2018-06-11 DIAGNOSIS — J449 Chronic obstructive pulmonary disease, unspecified: Secondary | ICD-10-CM | POA: Diagnosis not present

## 2018-06-11 DIAGNOSIS — E1169 Type 2 diabetes mellitus with other specified complication: Secondary | ICD-10-CM | POA: Diagnosis not present

## 2018-07-02 DIAGNOSIS — J449 Chronic obstructive pulmonary disease, unspecified: Secondary | ICD-10-CM | POA: Diagnosis not present

## 2018-08-02 DIAGNOSIS — J449 Chronic obstructive pulmonary disease, unspecified: Secondary | ICD-10-CM | POA: Diagnosis not present

## 2018-08-26 DIAGNOSIS — R69 Illness, unspecified: Secondary | ICD-10-CM | POA: Diagnosis not present

## 2018-09-01 DIAGNOSIS — J449 Chronic obstructive pulmonary disease, unspecified: Secondary | ICD-10-CM | POA: Diagnosis not present

## 2018-09-10 DIAGNOSIS — R69 Illness, unspecified: Secondary | ICD-10-CM | POA: Diagnosis not present

## 2018-09-10 DIAGNOSIS — E1159 Type 2 diabetes mellitus with other circulatory complications: Secondary | ICD-10-CM | POA: Diagnosis not present

## 2018-09-10 DIAGNOSIS — E1122 Type 2 diabetes mellitus with diabetic chronic kidney disease: Secondary | ICD-10-CM | POA: Diagnosis not present

## 2018-09-10 DIAGNOSIS — J449 Chronic obstructive pulmonary disease, unspecified: Secondary | ICD-10-CM | POA: Diagnosis not present

## 2018-09-10 DIAGNOSIS — I10 Essential (primary) hypertension: Secondary | ICD-10-CM | POA: Diagnosis not present

## 2018-09-10 DIAGNOSIS — E1169 Type 2 diabetes mellitus with other specified complication: Secondary | ICD-10-CM | POA: Diagnosis not present

## 2018-09-10 DIAGNOSIS — N183 Chronic kidney disease, stage 3 (moderate): Secondary | ICD-10-CM | POA: Diagnosis not present

## 2018-09-10 DIAGNOSIS — E785 Hyperlipidemia, unspecified: Secondary | ICD-10-CM | POA: Diagnosis not present

## 2018-09-10 DIAGNOSIS — F419 Anxiety disorder, unspecified: Secondary | ICD-10-CM | POA: Diagnosis not present

## 2018-09-11 DIAGNOSIS — I10 Essential (primary) hypertension: Secondary | ICD-10-CM | POA: Diagnosis not present

## 2018-09-11 DIAGNOSIS — E1159 Type 2 diabetes mellitus with other circulatory complications: Secondary | ICD-10-CM | POA: Diagnosis not present

## 2018-10-02 DIAGNOSIS — J449 Chronic obstructive pulmonary disease, unspecified: Secondary | ICD-10-CM | POA: Diagnosis not present

## 2018-10-06 DIAGNOSIS — Z1159 Encounter for screening for other viral diseases: Secondary | ICD-10-CM | POA: Diagnosis not present

## 2018-10-07 DIAGNOSIS — R69 Illness, unspecified: Secondary | ICD-10-CM | POA: Diagnosis not present

## 2018-10-12 DIAGNOSIS — J9611 Chronic respiratory failure with hypoxia: Secondary | ICD-10-CM | POA: Diagnosis not present

## 2018-10-12 DIAGNOSIS — I2510 Atherosclerotic heart disease of native coronary artery without angina pectoris: Secondary | ICD-10-CM | POA: Diagnosis not present

## 2018-10-12 DIAGNOSIS — R69 Illness, unspecified: Secondary | ICD-10-CM | POA: Diagnosis not present

## 2018-10-12 DIAGNOSIS — I25810 Atherosclerosis of coronary artery bypass graft(s) without angina pectoris: Secondary | ICD-10-CM | POA: Diagnosis not present

## 2018-10-12 DIAGNOSIS — E119 Type 2 diabetes mellitus without complications: Secondary | ICD-10-CM | POA: Diagnosis not present

## 2018-10-12 DIAGNOSIS — I509 Heart failure, unspecified: Secondary | ICD-10-CM | POA: Diagnosis not present

## 2018-10-12 DIAGNOSIS — Z951 Presence of aortocoronary bypass graft: Secondary | ICD-10-CM | POA: Diagnosis not present

## 2018-10-12 DIAGNOSIS — Z20828 Contact with and (suspected) exposure to other viral communicable diseases: Secondary | ICD-10-CM | POA: Diagnosis not present

## 2018-10-12 DIAGNOSIS — Z9981 Dependence on supplemental oxygen: Secondary | ICD-10-CM | POA: Diagnosis not present

## 2018-10-12 DIAGNOSIS — I5023 Acute on chronic systolic (congestive) heart failure: Secondary | ICD-10-CM | POA: Diagnosis not present

## 2018-10-12 DIAGNOSIS — I110 Hypertensive heart disease with heart failure: Secondary | ICD-10-CM | POA: Diagnosis not present

## 2018-10-12 DIAGNOSIS — I350 Nonrheumatic aortic (valve) stenosis: Secondary | ICD-10-CM | POA: Diagnosis not present

## 2018-10-12 DIAGNOSIS — J449 Chronic obstructive pulmonary disease, unspecified: Secondary | ICD-10-CM | POA: Diagnosis not present

## 2018-10-12 DIAGNOSIS — I440 Atrioventricular block, first degree: Secondary | ICD-10-CM | POA: Diagnosis not present

## 2018-10-12 DIAGNOSIS — Z955 Presence of coronary angioplasty implant and graft: Secondary | ICD-10-CM | POA: Diagnosis not present

## 2018-10-12 DIAGNOSIS — I5033 Acute on chronic diastolic (congestive) heart failure: Secondary | ICD-10-CM | POA: Diagnosis not present

## 2018-10-12 DIAGNOSIS — R0602 Shortness of breath: Secondary | ICD-10-CM | POA: Diagnosis not present

## 2018-10-15 MED ORDER — PANTOPRAZOLE SODIUM 40 MG PO TBEC
40.00 | DELAYED_RELEASE_TABLET | ORAL | Status: DC
Start: 2018-10-16 — End: 2018-10-15

## 2018-10-15 MED ORDER — ALBUTEROL SULFATE HFA 108 (90 BASE) MCG/ACT IN AERS
2.00 | INHALATION_SPRAY | RESPIRATORY_TRACT | Status: DC
Start: ? — End: 2018-10-15

## 2018-10-15 MED ORDER — DEXTROSE 10 % IV SOLN
125.00 | INTRAVENOUS | Status: DC
Start: ? — End: 2018-10-15

## 2018-10-15 MED ORDER — METOPROLOL TARTRATE 50 MG PO TABS
50.00 | ORAL_TABLET | ORAL | Status: DC
Start: 2018-10-15 — End: 2018-10-15

## 2018-10-15 MED ORDER — MELOXICAM 7.5 MG PO TABS
7.50 | ORAL_TABLET | ORAL | Status: DC
Start: 2018-10-16 — End: 2018-10-15

## 2018-10-15 MED ORDER — INSULIN LISPRO 100 UNIT/ML ~~LOC~~ SOLN
2.00 | SUBCUTANEOUS | Status: DC
Start: 2018-10-15 — End: 2018-10-15

## 2018-10-15 MED ORDER — ESCITALOPRAM OXALATE 10 MG PO TABS
10.00 | ORAL_TABLET | ORAL | Status: DC
Start: 2018-10-16 — End: 2018-10-15

## 2018-10-15 MED ORDER — BUDESONIDE-FORMOTEROL FUMARATE 80-4.5 MCG/ACT IN AERO
2.00 | INHALATION_SPRAY | RESPIRATORY_TRACT | Status: DC
Start: 2018-10-15 — End: 2018-10-15

## 2018-10-15 MED ORDER — CLOPIDOGREL BISULFATE 75 MG PO TABS
75.00 | ORAL_TABLET | ORAL | Status: DC
Start: 2018-10-16 — End: 2018-10-15

## 2018-10-15 MED ORDER — TORSEMIDE 10 MG PO TABS
40.00 | ORAL_TABLET | ORAL | Status: DC
Start: 2018-10-16 — End: 2018-10-15

## 2018-10-15 MED ORDER — GLUCOSE 40 % PO GEL
15.00 | ORAL | Status: DC
Start: ? — End: 2018-10-15

## 2018-10-15 MED ORDER — ASPIRIN EC 81 MG PO TBEC
81.00 | DELAYED_RELEASE_TABLET | ORAL | Status: DC
Start: 2018-10-16 — End: 2018-10-15

## 2018-10-19 ENCOUNTER — Telehealth: Payer: Self-pay | Admitting: Family Medicine

## 2018-10-19 NOTE — Telephone Encounter (Signed)
Yes but it looks like he was in the hospital for shortness of breath so if he was in for shortness of breath and we are likely not going to be able to see him in person anyways

## 2018-10-26 NOTE — Telephone Encounter (Signed)
Has appt to see Hendricks Limes 7/15

## 2018-10-27 ENCOUNTER — Other Ambulatory Visit: Payer: Self-pay

## 2018-10-28 ENCOUNTER — Ambulatory Visit (INDEPENDENT_AMBULATORY_CARE_PROVIDER_SITE_OTHER): Payer: Medicare HMO | Admitting: Family Medicine

## 2018-10-28 ENCOUNTER — Encounter: Payer: Self-pay | Admitting: Family Medicine

## 2018-10-28 VITALS — BP 124/73 | HR 83 | Temp 97.8°F | Ht 68.0 in | Wt 207.6 lb

## 2018-10-28 DIAGNOSIS — F322 Major depressive disorder, single episode, severe without psychotic features: Secondary | ICD-10-CM | POA: Diagnosis not present

## 2018-10-28 DIAGNOSIS — Z9981 Dependence on supplemental oxygen: Secondary | ICD-10-CM

## 2018-10-28 DIAGNOSIS — F419 Anxiety disorder, unspecified: Secondary | ICD-10-CM

## 2018-10-28 DIAGNOSIS — Z7689 Persons encountering health services in other specified circumstances: Secondary | ICD-10-CM

## 2018-10-28 DIAGNOSIS — J449 Chronic obstructive pulmonary disease, unspecified: Secondary | ICD-10-CM | POA: Diagnosis not present

## 2018-10-28 DIAGNOSIS — I2510 Atherosclerotic heart disease of native coronary artery without angina pectoris: Secondary | ICD-10-CM

## 2018-10-28 DIAGNOSIS — I509 Heart failure, unspecified: Secondary | ICD-10-CM | POA: Diagnosis not present

## 2018-10-28 DIAGNOSIS — K219 Gastro-esophageal reflux disease without esophagitis: Secondary | ICD-10-CM | POA: Diagnosis not present

## 2018-10-28 DIAGNOSIS — E118 Type 2 diabetes mellitus with unspecified complications: Secondary | ICD-10-CM | POA: Insufficient documentation

## 2018-10-28 DIAGNOSIS — E119 Type 2 diabetes mellitus without complications: Secondary | ICD-10-CM

## 2018-10-28 DIAGNOSIS — E782 Mixed hyperlipidemia: Secondary | ICD-10-CM | POA: Diagnosis not present

## 2018-10-28 DIAGNOSIS — Z9181 History of falling: Secondary | ICD-10-CM

## 2018-10-28 DIAGNOSIS — R69 Illness, unspecified: Secondary | ICD-10-CM | POA: Diagnosis not present

## 2018-10-28 MED ORDER — METFORMIN HCL 1000 MG PO TABS: 1000.0000 mg | ORAL_TABLET | Freq: Two times a day (BID) | ORAL | 1 refills | Status: DC

## 2018-10-28 MED ORDER — TORSEMIDE 20 MG PO TABS: 40.0000 mg | ORAL_TABLET | Freq: Two times a day (BID) | ORAL | 1 refills | Status: DC

## 2018-10-28 MED ORDER — METOPROLOL TARTRATE 50 MG PO TABS: 50.0000 mg | ORAL_TABLET | Freq: Two times a day (BID) | ORAL | 1 refills | Status: DC

## 2018-10-28 MED ORDER — CLOPIDOGREL BISULFATE 75 MG PO TABS: 75.0000 mg | ORAL_TABLET | Freq: Every day | ORAL | 1 refills | Status: DC

## 2018-10-28 MED ORDER — SERTRALINE HCL 25 MG PO TABS: 25.0000 mg | ORAL_TABLET | Freq: Every day | ORAL | 2 refills | Status: DC

## 2018-10-28 MED ORDER — OMEPRAZOLE 40 MG PO CPDR: 40.0000 mg | DELAYED_RELEASE_CAPSULE | Freq: Every day | ORAL | 1 refills | Status: DC

## 2018-10-28 MED ORDER — BUDESONIDE-FORMOTEROL FUMARATE 80-4.5 MCG/ACT IN AERO: 2.0000 | INHALATION_SPRAY | Freq: Two times a day (BID) | RESPIRATORY_TRACT | 1 refills | Status: DC

## 2018-10-28 MED ORDER — SITAGLIPTIN PHOSPHATE 50 MG PO TABS: 50.0000 mg | ORAL_TABLET | Freq: Two times a day (BID) | ORAL | 1 refills | Status: DC

## 2018-10-28 MED ORDER — ROSUVASTATIN CALCIUM 20 MG PO TABS: 20.0000 mg | ORAL_TABLET | Freq: Every day | ORAL | 1 refills | Status: DC

## 2018-10-28 MED ORDER — ALBUTEROL SULFATE HFA 108 (90 BASE) MCG/ACT IN AERS: 2.0000 | INHALATION_SPRAY | Freq: Four times a day (QID) | RESPIRATORY_TRACT | 3 refills | Status: DC | PRN

## 2018-10-28 NOTE — Progress Notes (Signed)
New Patient Office Visit  Subjective:  Patient ID: Matthew Campos, male    DOB: [birth year]  Age: 78 y.o. MRN: [id number]  CC:  Chief Complaint  Patient presents with  . Establish Care  . Hospitalization Follow-up    HPI Matthew Campos presents today accompanied by his wife, who he is okay with being present. He is transferring care from Dr. Murrell Redden office as they have closed recently.   CHF Hospitalization: He is also here to follow-up on a recent hospitalization for acute on chronic heart failure. Patient was in the hospital 10/12/2018-10/15/2018. Patient was diuresed with IV Lasix and evaluated by cardiology. His symptoms of shortness of breath, orthopnea, and cough have resolved. Lasix was discontinued and he was started on torsemide 40 mg QD. His wife reports his mouth was getting so dry with the 40 mg QD that she started splitting it to give him 20 mg BID which was helpful. His Losartan was discontinued due to severe aortic stenosis and he has a follow-up scheduled with cardiology next month to discuss management. His A1c was 6.6 on admission so patient was told to hold glipizide and Prandin at discharge.   COPD: uses Symbicort BID. He has Albuterol nebulizer and an inhaler which he uses PRN, which is 2-3 times/week. He wears oxygen when he comes in from outside and at night. He does have a pulse oximeter at home and checks his oxygen saturation periodically. He reports he was 83% this morning. He does gets headaches frequently. Patient has completed spirometry in the past but states it was > 5 years ago.   Anxiety/Depression: patient has been taking Lexapro for years and does not feel it is helpful. He also takes Ativan PRN. The prescription says for sleep; his wife reports he takes it for shaking.  Depression screen PHQ 2/9 10/28/2018  Decreased Interest 0  Down, Depressed, Hopeless 1  PHQ - 2 Score 1   GAD 7 : Generalized Anxiety Score 10/28/2018  Nervous, Anxious, on Edge 3   Control/stop worrying 3  Worry too much - different things 3  Trouble relaxing 0  Restless 3  Easily annoyed or irritable 3  Afraid - awful might happen 0  Total GAD 7 Score 15  Anxiety Difficulty Not difficult at all   Fall Risk: patients wife reports he is at a high risk of falls. He does use a cane to ambulate.    Review of Systems  Constitutional: Negative for chills, fever, malaise/fatigue and weight loss.  HENT: Negative for congestion, ear discharge, ear pain, nosebleeds, sinus pain, sore throat and tinnitus.   Eyes: Negative for blurred vision, double vision, pain, discharge and redness.  Respiratory: Negative for cough, shortness of breath and wheezing.   Cardiovascular: Negative for chest pain, palpitations and leg swelling.  Gastrointestinal: Positive for heartburn. Negative for abdominal pain, constipation, diarrhea, nausea and vomiting.  Genitourinary: Negative for dysuria, frequency and urgency.  Musculoskeletal: Positive for falls. Negative for myalgias.  Skin: Negative for rash.  Neurological: Positive for tremors, weakness and headaches. Negative for dizziness and seizures.  Psychiatric/Behavioral: Negative for depression, substance abuse and suicidal ideas. The patient is nervous/anxious.     Current Outpatient Medications:  .  albuterol (PROVENTIL) (2.5 MG/3ML) 0.083% nebulizer solution, Take 2.5 mg by nebulization every 6 (six) hours as needed for wheezing or shortness of breath., Disp: , Rfl:  .  albuterol (VENTOLIN HFA) 108 (90 Base) MCG/ACT inhaler, Inhale 2 puffs into the lungs every 6 (six) hours  as needed for wheezing or shortness of breath., Disp: 18 g, Rfl: 3 .  aspirin 81 MG chewable tablet, Chew 81 mg by mouth daily., Disp: , Rfl:  .  budesonide-formoterol (SYMBICORT) 80-4.5 MCG/ACT inhaler, Inhale 2 puffs into the lungs 2 (two) times daily., Disp: 3 Inhaler, Rfl: 1 .  Calcium Carbonate-Simethicone 750-80 MG CHEW, Chew 1 tablet by mouth as needed., Disp:  , Rfl:  .  clopidogrel (PLAVIX) 75 MG tablet, Take 1 tablet (75 mg total) by mouth daily., Disp: 90 tablet, Rfl: 1 .  glucose blood (ONETOUCH VERIO) test strip, TID Dx E11.9, Disp: , Rfl:  .  metFORMIN (GLUCOPHAGE) 1000 MG tablet, Take 1 tablet (1,000 mg total) by mouth 2 (two) times daily with a meal., Disp: 180 tablet, Rfl: 1 .  metoprolol tartrate (LOPRESSOR) 50 MG tablet, Take 1 tablet (50 mg total) by mouth 2 (two) times daily., Disp: 180 tablet, Rfl: 1 .  Multiple Vitamin (MULTIVITAMIN WITH MINERALS) TABS, Take 1 tablet by mouth daily., Disp: , Rfl:  .  nitroGLYCERIN (NITROSTAT) 0.4 MG SL tablet, Place 0.4 mg under the tongue every 5 (five) minutes as needed. For chest pain, Disp: , Rfl:  .  ofloxacin (OCUFLOX) 0.3 % ophthalmic solution, every morning., Disp: , Rfl:  .  rosuvastatin (CRESTOR) 20 MG tablet, Take 1 tablet (20 mg total) by mouth daily., Disp: 90 tablet, Rfl: 1 .  sitaGLIPtin (JANUVIA) 50 MG tablet, Take 1 tablet (50 mg total) by mouth 2 (two) times a day., Disp: 180 tablet, Rfl: 1 .  torsemide (DEMADEX) 20 MG tablet, Take 2 tablets (40 mg total) by mouth 2 (two) times daily., Disp: 180 tablet, Rfl: 1 .  Blood Glucose Monitoring Suppl (ONETOUCH VERIO) w/Device KIT, TID, Disp: , Rfl:  .  omeprazole (PRILOSEC) 40 MG capsule, Take 1 capsule (40 mg total) by mouth daily., Disp: 90 capsule, Rfl: 1 .  sertraline (ZOLOFT) 25 MG tablet, Take 1 tablet (25 mg total) by mouth daily., Disp: 30 tablet, Rfl: 2  Allergies  Allergen Reactions  . Codeine Nausea And Vomiting  . Latex Hives and Itching  . Lisinopril Cough    Past Medical History:  Diagnosis Date  . AAA (abdominal aortic aneurysm) without rupture ([place])    repaired  . Aortic stenosis   . CHF (congestive heart failure) ([place])   . Complication of anesthesia    hard to be put to sleep  . COPD (chronic obstructive pulmonary disease) ([place])   . Coronary artery disease   . Diabetes mellitus without complication ([place])    takes  Januvia and Metformin daily  . GERD (gastroesophageal reflux disease)    takes omeprazole daily  . Headache(784.0)    sinus  . History of bronchitis    last time >74yr ago  . Hyperlipidemia    takes Lipitor daily  . Hypertension    takes Metoprolol daily  . Joint pain   . Myocardial infarction ([place]    x 3;last one about 3-464yrago  . Pneumonia    last itme about [id number]    Past Surgical History:  Procedure Laterality Date  . abdominal aneurysm stenting    . CORONARY ANGIOPLASTY  2010  . cyst removed from left wrist    . HERNIA REPAIR    . left shoulder surgery    . SHOULDER ARTHROSCOPY WITH ROTATOR CUFF REPAIR AND SUBACROMIAL DECOMPRESSION  04/17/2012   Procedure: SHOULDER ARTHROSCOPY WITH ROTATOR CUFF REPAIR AND SUBACROMIAL DECOMPRESSION;  Surgeon: W DLockie Pares  Brooke Bonito., MD;  Location: [facility];  Service: Orthopedics;  Laterality: Right;  RIGHT SHOULDER ROTATOR CUFF REPAIR INCLUDING ACROMIOPLASTY CHRONIC, ARTHROSCOPY SHOULDER DEBRIDEMENT EXTENSIVE  . TONSILLECTOMY      Family History  Problem Relation Age of Onset  . Cancer Mother        Unknown type  . Heart disease Father   . Diabetes Father   . Heart disease Sister   . Seizures Brother   . Diabetes Daughter   . Diabetes Daughter   . Heart attack Son     Social History   Socioeconomic History  . Marital status: Married    Spouse name: Not on file  . Number of children: Not on file  . Years of education: Not on file  . Highest education level: Not on file  Occupational History  . Not on file  Social Needs  . Financial resource strain: Not on file  . Food insecurity    Worry: Not on file    Inability: Not on file  . Transportation needs    Medical: Not on file    Non-medical: Not on file  Tobacco Use  . Smoking status: Former Research scientist (life sciences)  . Smokeless tobacco: Never Used  . Tobacco comment: quit smoking 20+yrs ago  Substance and Sexual Activity  . Alcohol use: No  . Drug use: No  . Sexual activity: Yes   Lifestyle  . Physical activity    Days per week: Not on file    Minutes per session: Not on file  . Stress: Not on file  Relationships  . Social Herbalist on phone: Not on file    Gets together: Not on file    Attends religious service: Not on file    Active member of club or organization: Not on file    Attends meetings of clubs or organizations: Not on file    Relationship status: Not on file  . Intimate partner violence    Fear of current or ex partner: Not on file    Emotionally abused: Not on file    Physically abused: Not on file    Forced sexual activity: Not on file  Other Topics Concern  . Not on file  Social History Narrative  . Not on file    Objective:   Today's Vitals: BP 124/73   Pulse 83   Temp 97.8 F (36.6 C) (Oral)   Ht _0  (1.727 m)   Wt 207 lb 9.6 oz (94.2 kg)   BMI 31.57 kg/m   Physical Exam Vitals signs reviewed.  Constitutional:      General: He is not in acute distress.    Appearance: Normal appearance. He is obese. He is not ill-appearing, toxic-appearing or diaphoretic.  HENT:     Head: Normocephalic and atraumatic.  Eyes:     General: No scleral icterus.       Right eye: No discharge.        Left eye: No discharge.     Conjunctiva/sclera: Conjunctivae normal.  Neck:     Musculoskeletal: Normal range of motion.  Cardiovascular:     Rate and Rhythm: Normal rate and regular rhythm.     Heart sounds: Murmur present. No friction rub. No gallop.   Pulmonary:     Effort: Pulmonary effort is normal. No respiratory distress.     Breath sounds: Normal breath sounds. No stridor. No wheezing, rhonchi or rales.  Musculoskeletal: Normal range of motion.  Skin:    General:  Skin is warm and dry.  Neurological:     Mental Status: He is alert and oriented to person, place, and time. Mental status is at baseline.     Gait: Gait abnormal (ambulates with cane).  Psychiatric:        Mood and Affect: Mood normal.        Behavior:  Behavior normal.        Thought Content: Thought content normal.        Judgment: Judgment normal.     Assessment & Plan:   1. Chronic congestive heart failure, unspecified heart failure type ([place]) - Symptoms have improved from acute exacerbation of heart failure that led him to the hospital. Discussed that he is to remain off Losartan and follow-up with cardiology next month. Advised him that if he is going to continue taking Torsemide BID he should take his second dose around 4 PM so that he is not up urinating throughout the night and increasing his risk of falling.  - metoprolol tartrate (LOPRESSOR) 50 MG tablet; Take 1 tablet (50 mg total) by mouth 2 (two) times daily.  Dispense: 180 tablet; Refill: 1 - torsemide (DEMADEX) 20 MG tablet; Take 2 tablets (40 mg total) by mouth 2 (two) times daily.  Dispense: 180 tablet; Refill: 1  2. Diabetes mellitus without complication ([place]) - O1Y 6.6 on 10/12/2018; he will stay off Glipizide and Prandin and re-evaluate A1c in 3 months. We discussed that I do not want his A1c too low as this also increases his risk of falling due to low blood sugars. Most recent diabetic eye exam requested from [facility] in [place]. He is on a statin medication. Urine microalbumin/creatinine ratio within the last year with [facility].  - metFORMIN (GLUCOPHAGE) 1000 MG tablet; Take 1 tablet (1,000 mg total) by mouth 2 (two) times daily with a meal.  Dispense: 180 tablet; Refill: 1 - sitaGLIPtin (JANUVIA) 50 MG tablet; Take 1 tablet (50 mg total) by mouth 2 (two) times a day.  Dispense: 180 tablet; Refill: 1  3. Anxiety - Lexapro discontinued as patient did not feel well controlled. Ativan also discontinued as he did not use it often as well as due to his age and high fall risk. He did not wish to increase his dosage of Lexparo as he does not feel it was helping him at all.  - sertraline (ZOLOFT) 25 MG tablet; Take 1 tablet (25 mg total) by mouth daily.  Dispense: 30  tablet; Refill: 2  4. Major depressive disorder, single episode, severe without psychotic features ([place]) - Lexapro discontinued as patient did not feel well controlled. He did not wish to increase his dosage as he does not feel it was helping him at all.  - sertraline (ZOLOFT) 25 MG tablet; Take 1 tablet (25 mg total) by mouth daily.  Dispense: 30 tablet; Refill: 2  5. Chronic obstructive pulmonary disease, unspecified COPD type ([place]) - Controlled with Symbicort BID and Albuterol PRN. Patient encouraged to wear oxygen more frequently, especially if his oxygen saturation is < 90%. Discussed this could be the cause of his frequent headaches. Discussed with wife that if he appears confused she should check oxygen saturation and apply oxygen if indicated.  - albuterol (VENTOLIN HFA) 108 (90 Base) MCG/ACT inhaler; Inhale 2 puffs into the lungs every 6 (six) hours as needed for wheezing or shortness of breath.  Dispense: 18 g; Refill: 3 - budesonide-formoterol (SYMBICORT) 80-4.5 MCG/ACT inhaler; Inhale 2 puffs into the lungs 2 (two) times  daily.  Dispense: 3 Inhaler; Refill: 1  6. Oxygen dependent - Discussed appropriate use as above in #5.   7. Gastroesophageal reflux disease, esophagitis presence not specified - Controlled on current regimen.  - omeprazole (PRILOSEC) 40 MG capsule; Take 1 capsule (40 mg total) by mouth daily.  Dispense: 90 capsule; Refill: 1  8. Mixed hyperlipidemia - Controlled on current regimen.  - rosuvastatin (CRESTOR) 20 MG tablet; Take 1 tablet (20 mg total) by mouth daily.  Dispense: 90 tablet; Refill: 1  9. Coronary artery disease involving native coronary artery of native heart without angina pectoris - Continue Plavix and keep cardiology follow-up for next month.  - clopidogrel (PLAVIX) 75 MG tablet; Take 1 tablet (75 mg total) by mouth daily.  Dispense: 90 tablet; Refill: 1  10. At risk for falls - Discontinued Ativan PRN. Patient will stay off Glipizide and  Prandin and allow for A1c in the 7s. Patient to wear oxygen more frequently as discussed. He will continue with his use of a cane.   11. Encounter to establish care   Follow-up: Return in about 3 months (around 01/28/2019) for follow-up of DM and anxiety.   Hendricks Limes, MSN, APRN, FNP-C [facility] 10/28/18

## 2018-11-01 DIAGNOSIS — J449 Chronic obstructive pulmonary disease, unspecified: Secondary | ICD-10-CM | POA: Diagnosis not present

## 2018-11-06 ENCOUNTER — Telehealth: Payer: Self-pay | Admitting: Family Medicine

## 2018-11-06 DIAGNOSIS — J449 Chronic obstructive pulmonary disease, unspecified: Secondary | ICD-10-CM

## 2018-11-06 DIAGNOSIS — R531 Weakness: Secondary | ICD-10-CM

## 2018-11-06 DIAGNOSIS — I509 Heart failure, unspecified: Secondary | ICD-10-CM

## 2018-11-06 DIAGNOSIS — Z9181 History of falling: Secondary | ICD-10-CM

## 2018-11-06 NOTE — Telephone Encounter (Signed)
Patient notified about walker. States that since he saw you he has been very sick with upset stomach, nausea, and diarrhea. He thinks its his medications. Wants to know what you think.

## 2018-11-06 NOTE — Telephone Encounter (Signed)
Order for walker placed and sent to [facility].

## 2018-11-06 NOTE — Telephone Encounter (Signed)
The new medication was Zoloft. Tell him to stop taking it and see if the GI symptoms resolve. He should report back next week to let us know if it resolved or not.

## 2018-11-09 ENCOUNTER — Telehealth: Payer: Self-pay | Admitting: Family Medicine

## 2018-11-09 ENCOUNTER — Ambulatory Visit: Payer: Self-pay | Admitting: Family Medicine

## 2018-11-09 DIAGNOSIS — R2681 Unsteadiness on feet: Secondary | ICD-10-CM | POA: Diagnosis not present

## 2018-11-09 NOTE — Telephone Encounter (Signed)
Dx's of weakness & at risk for falls given

## 2018-11-12 ENCOUNTER — Encounter: Payer: Self-pay | Admitting: Family Medicine

## 2018-11-12 ENCOUNTER — Other Ambulatory Visit: Payer: Self-pay

## 2018-11-12 ENCOUNTER — Ambulatory Visit (INDEPENDENT_AMBULATORY_CARE_PROVIDER_SITE_OTHER): Payer: Medicare HMO | Admitting: Family Medicine

## 2018-11-12 ENCOUNTER — Telehealth: Payer: Self-pay

## 2018-11-12 DIAGNOSIS — R197 Diarrhea, unspecified: Secondary | ICD-10-CM | POA: Diagnosis not present

## 2018-11-12 DIAGNOSIS — E119 Type 2 diabetes mellitus without complications: Secondary | ICD-10-CM

## 2018-11-12 MED ORDER — METFORMIN HCL ER 500 MG PO TB24: 1000.0000 mg | ORAL_TABLET | Freq: Two times a day (BID) | ORAL | 3 refills | Status: DC

## 2018-11-12 MED ORDER — METFORMIN HCL ER 500 MG PO TB24: 1000.0000 mg | ORAL_TABLET | Freq: Every day | ORAL | 3 refills | Status: DC

## 2018-11-12 NOTE — Progress Notes (Signed)
Virtual Visit via telephone Note  I connected with Matthew Campos on 11/12/18 at 1104 by telephone and verified that I am speaking with the correct person using two identifiers. Matthew Campos is currently located at home and Patient's wife are currently with her during visit. The provider, Fransisca Kaufmann San Lohmeyer, MD is located in their office at time of visit.  Call ended at 1114  I discussed the limitations, risks, security and privacy concerns of performing an evaluation and management service by telephone and the availability of in person appointments. I also discussed with the patient that there may be a patient responsible charge related to this service. The patient expressed understanding and agreed to proceed.   History and Present Illness: Patient has been sick for the 10/28/18 saw [facility] Limes and has been having diarrhea.  The diarrhea is watery and more than ten times per day.  He has a dry mouth.  He has upset stomach. He was in the hospital on 10/12/2018 for CHF and was switched to torsemide and was switched from metformin er to metformin because of recall.  He   No diagnosis found.  Outpatient Encounter Medications as of 11/12/2018  Medication Sig  . albuterol (PROVENTIL) (2.5 MG/3ML) 0.083% nebulizer solution Take 2.5 mg by nebulization every 6 (six) hours as needed for wheezing or shortness of breath.  [name] albuterol (VENTOLIN HFA) 108 (90 Base) MCG/ACT inhaler Inhale 2 puffs into the lungs every 6 (six) hours as needed for wheezing or shortness of breath.  [name] aspirin 81 MG chewable tablet Chew 81 mg by mouth daily.  . Blood Glucose Monitoring Suppl (ONETOUCH VERIO) w/Device KIT TID  . budesonide-formoterol (SYMBICORT) 80-4.5 MCG/ACT inhaler Inhale 2 puffs into the lungs 2 (two) times daily.  . Calcium Carbonate-Simethicone 750-80 MG CHEW Chew 1 tablet by mouth as needed.  . clopidogrel (PLAVIX) 75 MG tablet Take 1 tablet (75 mg total) by mouth daily.  [name] glucose blood (ONETOUCH  VERIO) test strip TID Dx E11.9  . metFORMIN (GLUCOPHAGE) 1000 MG tablet Take 1 tablet (1,000 mg total) by mouth 2 (two) times daily with a meal.  . metoprolol tartrate (LOPRESSOR) 50 MG tablet Take 1 tablet (50 mg total) by mouth 2 (two) times daily.  . Multiple Vitamin (MULTIVITAMIN WITH MINERALS) TABS Take 1 tablet by mouth daily.  . nitroGLYCERIN (NITROSTAT) 0.4 MG SL tablet Place 0.4 mg under the tongue every 5 (five) minutes as needed. For chest pain  . ofloxacin (OCUFLOX) 0.3 % ophthalmic solution every morning.  [name] omeprazole (PRILOSEC) 40 MG capsule Take 1 capsule (40 mg total) by mouth daily.  . rosuvastatin (CRESTOR) 20 MG tablet Take 1 tablet (20 mg total) by mouth daily.  . sertraline (ZOLOFT) 25 MG tablet Take 1 tablet (25 mg total) by mouth daily.  . sitaGLIPtin (JANUVIA) 50 MG tablet Take 1 tablet (50 mg total) by mouth 2 (two) times a day.  . torsemide (DEMADEX) 20 MG tablet Take 2 tablets (40 mg total) by mouth 2 (two) times daily.   No facility-administered encounter medications on file as of 11/12/2018.     Review of Systems  Constitutional: Negative for chills and fever.  Respiratory: Negative for shortness of breath and wheezing.   Cardiovascular: Negative for chest pain and leg swelling.  Gastrointestinal: Positive for abdominal distention, diarrhea and nausea. Negative for abdominal pain and vomiting.  Musculoskeletal: Negative for back pain and gait problem.  Skin: Negative for rash.  Neurological: Negative for dizziness, weakness and  numbness.  All other systems reviewed and are negative.   Observations/Objective: Patient sounds comfortable and in no acute distress, most conversation was with his wife  Assessment and Plan: Problem List Items Addressed This Visit      Endocrine   Diabetes mellitus without complication ([place]) - Primary   Relevant Medications   metFORMIN (GLUCOPHAGE XR) 500 MG 24 hr tablet    Other Visit Diagnoses    Diarrhea, unspecified type        Relevant Medications   metFORMIN (GLUCOPHAGE XR) 500 MG 24 hr tablet       Follow Up Instructions: Patient has been having a lot of diarrhea, she did not know why but based on in the leaving the hospital and where he has been at, it looks like he has been on the extended release previously for the metformin did not tolerate the normal metformin and that is likely what caused his issues currently.  Recommend that if he gets dehydrated and stops making urine and is go back to the hospital but otherwise will try and switch back to the extended release, knowing that it is on recall.  He should have his normal follow-up in 2 months with his PCP    I discussed the assessment and treatment plan with the patient. The patient was provided an opportunity to ask questions and all were answered. The patient agreed with the plan and demonstrated an understanding of the instructions.   The patient was advised to call back or seek an in-person evaluation if the symptoms worsen or if the condition fails to improve as anticipated.  The above assessment and management plan was discussed with the patient. The patient verbalized understanding of and has agreed to the management plan. Patient is aware to call the clinic if symptoms persist or worsen. Patient is aware when to return to the clinic for a follow-up visit. Patient educated on when it is appropriate to go to the emergency department.    I provided 10 minutes of non-face-to-face time during this encounter.    Worthy Rancher, MD

## 2018-11-12 NOTE — Telephone Encounter (Signed)
I fixed it and resent it

## 2018-11-12 NOTE — Telephone Encounter (Signed)
Drug store called and states that the directions and quantity does not match on the metformin 500. Please review and advise.

## 2018-11-16 NOTE — Telephone Encounter (Signed)
Patient seen since telephone encounter.  This encounter will now be closed. 

## 2018-11-17 DIAGNOSIS — R69 Illness, unspecified: Secondary | ICD-10-CM | POA: Diagnosis not present

## 2018-11-20 ENCOUNTER — Telehealth: Payer: Self-pay | Admitting: Family Medicine

## 2018-11-20 NOTE — Telephone Encounter (Signed)
Pt wife called and PCP changed - next appt made for 10/15

## 2018-11-25 DIAGNOSIS — I259 Chronic ischemic heart disease, unspecified: Secondary | ICD-10-CM | POA: Diagnosis not present

## 2018-11-25 DIAGNOSIS — I5033 Acute on chronic diastolic (congestive) heart failure: Secondary | ICD-10-CM | POA: Diagnosis not present

## 2018-11-25 DIAGNOSIS — Z8679 Personal history of other diseases of the circulatory system: Secondary | ICD-10-CM | POA: Diagnosis not present

## 2018-11-25 DIAGNOSIS — I209 Angina pectoris, unspecified: Secondary | ICD-10-CM | POA: Diagnosis not present

## 2018-11-25 DIAGNOSIS — I350 Nonrheumatic aortic (valve) stenosis: Secondary | ICD-10-CM | POA: Diagnosis not present

## 2018-11-26 DIAGNOSIS — I350 Nonrheumatic aortic (valve) stenosis: Secondary | ICD-10-CM | POA: Insufficient documentation

## 2018-12-02 DIAGNOSIS — J449 Chronic obstructive pulmonary disease, unspecified: Secondary | ICD-10-CM | POA: Diagnosis not present

## 2018-12-04 DIAGNOSIS — Z955 Presence of coronary angioplasty implant and graft: Secondary | ICD-10-CM | POA: Diagnosis not present

## 2018-12-04 DIAGNOSIS — I259 Chronic ischemic heart disease, unspecified: Secondary | ICD-10-CM | POA: Diagnosis not present

## 2018-12-04 DIAGNOSIS — I2510 Atherosclerotic heart disease of native coronary artery without angina pectoris: Secondary | ICD-10-CM | POA: Diagnosis not present

## 2018-12-04 DIAGNOSIS — R42 Dizziness and giddiness: Secondary | ICD-10-CM | POA: Diagnosis not present

## 2018-12-04 DIAGNOSIS — I350 Nonrheumatic aortic (valve) stenosis: Secondary | ICD-10-CM | POA: Diagnosis not present

## 2018-12-04 DIAGNOSIS — Z87891 Personal history of nicotine dependence: Secondary | ICD-10-CM | POA: Diagnosis not present

## 2018-12-14 DIAGNOSIS — I350 Nonrheumatic aortic (valve) stenosis: Secondary | ICD-10-CM | POA: Diagnosis not present

## 2018-12-14 DIAGNOSIS — I714 Abdominal aortic aneurysm, without rupture: Secondary | ICD-10-CM | POA: Diagnosis not present

## 2018-12-14 DIAGNOSIS — R911 Solitary pulmonary nodule: Secondary | ICD-10-CM | POA: Diagnosis not present

## 2018-12-14 DIAGNOSIS — I708 Atherosclerosis of other arteries: Secondary | ICD-10-CM | POA: Diagnosis not present

## 2018-12-14 DIAGNOSIS — I6523 Occlusion and stenosis of bilateral carotid arteries: Secondary | ICD-10-CM | POA: Diagnosis not present

## 2018-12-18 ENCOUNTER — Telehealth: Payer: Self-pay | Admitting: Family Medicine

## 2018-12-22 ENCOUNTER — Ambulatory Visit: Payer: Medicare HMO | Admitting: Family Medicine

## 2018-12-22 ENCOUNTER — Ambulatory Visit (INDEPENDENT_AMBULATORY_CARE_PROVIDER_SITE_OTHER): Payer: Medicare HMO | Admitting: Family Medicine

## 2018-12-22 DIAGNOSIS — Z7409 Other reduced mobility: Secondary | ICD-10-CM

## 2018-12-22 DIAGNOSIS — R531 Weakness: Secondary | ICD-10-CM

## 2018-12-22 DIAGNOSIS — R112 Nausea with vomiting, unspecified: Secondary | ICD-10-CM

## 2018-12-22 MED ORDER — ONDANSETRON 4 MG PO TBDP: 4.0000 mg | ORAL_TABLET | Freq: Three times a day (TID) | ORAL | 0 refills | Status: DC | PRN

## 2018-12-22 NOTE — Telephone Encounter (Signed)
Spoke to patient's wife.  televisit conducted. Rx sent.

## 2018-12-22 NOTE — Patient Instructions (Signed)
Vomiting, Adult Vomiting occurs when stomach contents are thrown up and out of the mouth. Many people notice nausea before vomiting. Vomiting can make you feel weak and cause you to become dehydrated. Dehydration can make you feel tired and thirsty, cause you to have a dry mouth, and decrease how often you urinate. Older adults and people who have other diseases or a weak body defense system (immune system) are at higher risk for dehydration. It is important to treat vomiting as told by your health care provider. Follow these instructions at home:  Eating and drinking     Follow these recommendations as told by your health care provider:  Take an oral rehydration solution (ORS). This is a drink that is sold at pharmacies and retail stores.  Eat bland, easy-to-digest foods in small amounts as you are able. These foods include bananas, applesauce, rice, lean meats, toast, and crackers.  Drink clear fluids slowly and in small amounts as you are able. Clear fluids include water, ice chips, low-calorie sports drinks, and fruit juice that has water added (diluted fruit juice).  Avoid drinking fluids that contain a lot of sugar or caffeine, such as energy drinks, sports drinks, and soda.  Avoid alcohol.  Avoid spicy or fatty foods.  General instructions  Wash your hands often using soap and water. If soap and water are not available, use hand sanitizer. Make sure that everyone in your household washes their hands frequently.  Take over-the-counter and prescription medicines only as told by your health care provider.  Rest at home while you recover.  Watch your condition for any changes.  Keep all follow-up visits as told by your health care provider. This is important. Contact a health care provider if:  Your vomiting gets worse.  You have new symptoms.  You have a fever.  You cannot drink fluids without vomiting.  You feel light-headed or dizzy.  You have a headache.  You  have muscle cramps.  You have a rash.  You have pain while urinating. Get help right away if:  You have pain in your chest, neck, arm, or jaw.  You feel extremely weak or you faint.  You have persistent vomiting.  You have vomit that is bright red or looks like black coffee grounds.  You have stools that are bloody or black, or stools that look like tar.  You have a severe headache, a stiff neck, or both.  You have severe pain, cramping, or bloating in your abdomen.  You have trouble breathing or you are breathing very quickly.  Your heart is beating very quickly.  Your skin feels cold and clammy.  You feel confused.  You have signs of dehydration, such as: ? Dark urine, very little urine, or no urine. ? Cracked lips. ? Dry mouth. ? Sunken eyes. ? Sleepiness. ? Weakness. These symptoms may represent a serious problem that is an emergency. Do not wait to see if the symptoms will go away. Get medical help right away. Call your local emergency services (911 in the [place] Do not drive yourself to the hospital. Summary  Vomiting occurs when stomach contents are thrown up and out of the mouth. Vomiting can cause you to become dehydrated. Older adults and people who have other diseases or a weak immune system are at higher risk for dehydration.  It is important to treat vomiting as told by your health care provider. Follow your health care provider's instructions about eating and drinking.  Wash your hands often using   soap and water. If soap and water are not available, use hand sanitizer. Make sure that everyone in your household washes their hands frequently.  Watch your condition for any changes and for signs of dehydration.  Keep all follow-up visits as told by your health care provider. This is important. This information is not intended to replace advice given to you by your health care provider. Make sure you discuss any questions you have with your health care  provider. Document Released: 04/28/2015 Document Revised: 09/09/2017 Document Reviewed: 09/09/2017 Elsevier Patient Education  2020 [employer] Inc.  

## 2018-12-22 NOTE — Progress Notes (Signed)
Telephone visit  Subjective: CC: nausea/ vomiting PCP: [name], DO Matthew:MVHQ D Campos is a 78 y.o. male calls for telephone consult today. Patient provides verbal consent for consult held via phone.  Location of patient: home Location of provider: [facility] Others present for call: wife  1. Nausea and vomiting Most of the history is provided by the patient's wife who notes that he had abrupt onset of nausea with vomiting this morning.  He has associated generalized abdominal pain that is not severe.  No hematochezia, melena, hematemesis.  No fevers.  No chest pain or shortness of breath.  No diaphoresis.  His blood sugar was in the 170s this morning.  He ate takeout food last night and they think that this is probably the cause.  He does report compliance with his PPI.  No other family members are sick.  He has preop scheduled tomorrow at [facility] as his wife reports he has an upcoming heart surgery  She notes chronic weakness and debilitation.  He has a rolling walker but she notes that when they go out they primarily have to push him around in his rolling walker because he is unable to ambulate independently.  She would like to have a wheelchair ordered for him and sent to [place] if possible.   ROS: Per HPI  Allergies  Allergen Reactions  . Codeine Nausea And Vomiting  . Latex Hives and Itching  . Lisinopril Cough   Past Medical History:  Diagnosis Date  . 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 Aortic stenosis   . CHF (congestive heart failure) ([place])   . Complication of anesthesia    hard to be put to sleep  . COPD (chronic obstructive pulmonary disease) ([place])   . Coronary artery disease   . Diabetes mellitus without complication ([place])    takes Januvia and Metformin daily  . GERD (gastroesophageal reflux disease)    takes omeprazole daily  . Headache(784.0)    sinus  . History of bronchitis    last time >51yr ago  .  Hyperlipidemia    takes Lipitor daily  . Hypertension    takes Metoprolol daily  . Joint pain   . [place]    x 3;last one about [id number]  . Pneumonia    last itme about [id number]    Current Outpatient Medications:  .  albuterol (PROVENTIL) (2.5 MG/3ML) 0.083% nebulizer solution, Take 2.5 mg by nebulization every 6 (six) hours as needed for wheezing or shortness of breath., Disp: , Rfl:  .  albuterol (VENTOLIN HFA) 108 (90 Base) MCG/ACT inhaler, Inhale 2 puffs into the lungs every 6 (six) hours as needed for wheezing or shortness of breath., Disp: 18 g, Rfl: 3 .  aspirin 81 MG chewable tablet, Chew 81 mg by mouth daily., Disp: , Rfl:  .  Blood Glucose Monitoring Suppl (ONETOUCH VERIO) w/Device KIT, TID, Disp: , Rfl:  .  budesonide-formoterol (SYMBICORT) 80-4.5 MCG/ACT inhaler, Inhale 2 puffs into the lungs 2 (two) times daily., Disp: 3 Inhaler, Rfl: 1 .  Calcium Carbonate-Simethicone 750-80 MG CHEW, Chew 1 tablet by mouth as needed., Disp: , Rfl:  .  clopidogrel (PLAVIX) 75 MG tablet, Take 1 tablet (75 mg total) by mouth daily., Disp: 90 tablet, Rfl: 1 .  glucose blood (ONETOUCH VERIO) test strip, TID Dx E11.9, Disp: , Rfl:  .  metFORMIN (GLUCOPHAGE XR) 500 MG 24 hr tablet, Take 2 tablets (1,000 mg total)  by mouth 2 (two) times daily., Disp: 360 tablet, Rfl: 3 .  metoprolol tartrate (LOPRESSOR) 50 MG tablet, Take 1 tablet (50 mg total) by mouth 2 (two) times daily., Disp: 180 tablet, Rfl: 1 .  Multiple Vitamin (MULTIVITAMIN WITH MINERALS) TABS, Take 1 tablet by mouth daily., Disp: , Rfl:  .  nitroGLYCERIN (NITROSTAT) 0.4 MG SL tablet, Place 0.4 mg under the tongue every 5 (five) minutes as needed. For chest pain, Disp: , Rfl:  .  ofloxacin (OCUFLOX) 0.3 % ophthalmic solution, every morning., Disp: , Rfl:  .  omeprazole (PRILOSEC) 40 MG capsule, Take 1 capsule (40 mg total) by mouth daily., Disp: 90 capsule, Rfl: 1 .  rosuvastatin (CRESTOR) 20 MG tablet, Take 1 tablet (20  mg total) by mouth daily., Disp: 90 tablet, Rfl: 1 .  sertraline (ZOLOFT) 25 MG tablet, Take 1 tablet (25 mg total) by mouth daily., Disp: 30 tablet, Rfl: 2 .  sitaGLIPtin (JANUVIA) 50 MG tablet, Take 1 tablet (50 mg total) by mouth 2 (two) times a day., Disp: 180 tablet, Rfl: 1 .  torsemide (DEMADEX) 20 MG tablet, Take 2 tablets (40 mg total) by mouth 2 (two) times daily., Disp: 180 tablet, Rfl: 1  Assessment/ Plan: 78 y.o. male   1. Non-intractable vomiting with nausea, unspecified vomiting type Possibly related to foodborne illness.  We discussed red flag signs and symptoms warranting further evaluation emergency department.  Have sent in Zofran.  Most recent EKG that was available for read was from 2014.  QTC was normal on that one.  Push fluids and if unable to tolerate immediately go to the emergency department.  He has preop labs and eval tomorrow at [facility] - ondansetron (ZOFRAN ODT) 4 MG disintegrating tablet; Take 1 tablet (4 mg total) by mouth every 8 (eight) hours as needed for nausea or vomiting.  Dispense: 20 tablet; Refill: 0  2. Impaired mobility and endurance I have written a prescription for a wheelchair to be sent to [place] as requested.  They are to see me postop  3. Weakness Will likely need physical therapy status post heart surgery.   Start time: 8:59am End time: 9:04am  Total time spent on patient care (including telephone call/ virtual visit): 12 minutes  [place], [place] [phone]

## 2018-12-23 ENCOUNTER — Telehealth: Payer: Self-pay | Admitting: Family Medicine

## 2018-12-23 DIAGNOSIS — Z8249 Family history of ischemic heart disease and other diseases of the circulatory system: Secondary | ICD-10-CM | POA: Diagnosis not present

## 2018-12-23 DIAGNOSIS — I352 Nonrheumatic aortic (valve) stenosis with insufficiency: Secondary | ICD-10-CM | POA: Diagnosis not present

## 2018-12-23 DIAGNOSIS — Z8679 Personal history of other diseases of the circulatory system: Secondary | ICD-10-CM | POA: Diagnosis not present

## 2018-12-23 DIAGNOSIS — I5031 Acute diastolic (congestive) heart failure: Secondary | ICD-10-CM | POA: Insufficient documentation

## 2018-12-23 DIAGNOSIS — G4733 Obstructive sleep apnea (adult) (pediatric): Secondary | ICD-10-CM | POA: Diagnosis present

## 2018-12-23 DIAGNOSIS — Z87891 Personal history of nicotine dependence: Secondary | ICD-10-CM | POA: Diagnosis not present

## 2018-12-23 DIAGNOSIS — E669 Obesity, unspecified: Secondary | ICD-10-CM | POA: Insufficient documentation

## 2018-12-23 DIAGNOSIS — D649 Anemia, unspecified: Secondary | ICD-10-CM

## 2018-12-23 DIAGNOSIS — Z006 Encounter for examination for normal comparison and control in clinical research program: Secondary | ICD-10-CM | POA: Diagnosis not present

## 2018-12-23 DIAGNOSIS — Z20828 Contact with and (suspected) exposure to other viral communicable diseases: Secondary | ICD-10-CM | POA: Diagnosis not present

## 2018-12-23 DIAGNOSIS — Z89021 Acquired absence of right finger(s): Secondary | ICD-10-CM | POA: Diagnosis not present

## 2018-12-23 DIAGNOSIS — I2510 Atherosclerotic heart disease of native coronary artery without angina pectoris: Secondary | ICD-10-CM | POA: Diagnosis not present

## 2018-12-23 DIAGNOSIS — Z955 Presence of coronary angioplasty implant and graft: Secondary | ICD-10-CM | POA: Diagnosis not present

## 2018-12-23 DIAGNOSIS — K219 Gastro-esophageal reflux disease without esophagitis: Secondary | ICD-10-CM

## 2018-12-23 DIAGNOSIS — Z85828 Personal history of other malignant neoplasm of skin: Secondary | ICD-10-CM | POA: Diagnosis not present

## 2018-12-23 DIAGNOSIS — E119 Type 2 diabetes mellitus without complications: Secondary | ICD-10-CM

## 2018-12-23 DIAGNOSIS — I779 Disorder of arteries and arterioles, unspecified: Secondary | ICD-10-CM | POA: Insufficient documentation

## 2018-12-23 DIAGNOSIS — F419 Anxiety disorder, unspecified: Secondary | ICD-10-CM | POA: Insufficient documentation

## 2018-12-23 DIAGNOSIS — J449 Chronic obstructive pulmonary disease, unspecified: Secondary | ICD-10-CM | POA: Diagnosis present

## 2018-12-23 NOTE — Telephone Encounter (Signed)
Yes this was given to [facility] yesterday to fax. Please check with her.

## 2018-12-23 NOTE — Telephone Encounter (Signed)
Please advise if wheel chair script will be done.

## 2018-12-24 DIAGNOSIS — R531 Weakness: Secondary | ICD-10-CM | POA: Diagnosis not present

## 2018-12-24 DIAGNOSIS — R5381 Other malaise: Secondary | ICD-10-CM | POA: Diagnosis not present

## 2018-12-24 DIAGNOSIS — E162 Hypoglycemia, unspecified: Secondary | ICD-10-CM | POA: Diagnosis not present

## 2018-12-24 DIAGNOSIS — E161 Other hypoglycemia: Secondary | ICD-10-CM | POA: Diagnosis not present

## 2018-12-24 DIAGNOSIS — R0602 Shortness of breath: Secondary | ICD-10-CM | POA: Diagnosis not present

## 2018-12-24 NOTE — Telephone Encounter (Signed)
Rx faxed to [place] and [name] notified. Advised to contact our office if she needs further assistance. Patient verbalized understanding

## 2018-12-29 DIAGNOSIS — R531 Weakness: Secondary | ICD-10-CM | POA: Diagnosis not present

## 2018-12-29 DIAGNOSIS — Z7409 Other reduced mobility: Secondary | ICD-10-CM | POA: Diagnosis not present

## 2018-12-29 DIAGNOSIS — R69 Illness, unspecified: Secondary | ICD-10-CM | POA: Diagnosis not present

## 2018-12-30 DIAGNOSIS — Z7982 Long term (current) use of aspirin: Secondary | ICD-10-CM | POA: Diagnosis not present

## 2018-12-30 DIAGNOSIS — R001 Bradycardia, unspecified: Secondary | ICD-10-CM | POA: Diagnosis not present

## 2018-12-30 DIAGNOSIS — Z8249 Family history of ischemic heart disease and other diseases of the circulatory system: Secondary | ICD-10-CM | POA: Diagnosis not present

## 2018-12-30 DIAGNOSIS — I352 Nonrheumatic aortic (valve) stenosis with insufficiency: Secondary | ICD-10-CM | POA: Diagnosis not present

## 2018-12-30 DIAGNOSIS — Z7901 Long term (current) use of anticoagulants: Secondary | ICD-10-CM | POA: Diagnosis not present

## 2018-12-30 DIAGNOSIS — Z8679 Personal history of other diseases of the circulatory system: Secondary | ICD-10-CM | POA: Diagnosis not present

## 2018-12-30 DIAGNOSIS — I2510 Atherosclerotic heart disease of native coronary artery without angina pectoris: Secondary | ICD-10-CM | POA: Diagnosis not present

## 2018-12-30 DIAGNOSIS — I350 Nonrheumatic aortic (valve) stenosis: Secondary | ICD-10-CM | POA: Diagnosis not present

## 2018-12-30 DIAGNOSIS — Z20828 Contact with and (suspected) exposure to other viral communicable diseases: Secondary | ICD-10-CM | POA: Diagnosis not present

## 2018-12-30 DIAGNOSIS — I499 Cardiac arrhythmia, unspecified: Secondary | ICD-10-CM | POA: Diagnosis not present

## 2018-12-30 DIAGNOSIS — Z952 Presence of prosthetic heart valve: Secondary | ICD-10-CM | POA: Diagnosis not present

## 2018-12-30 DIAGNOSIS — Z87891 Personal history of nicotine dependence: Secondary | ICD-10-CM | POA: Diagnosis not present

## 2018-12-30 DIAGNOSIS — Z955 Presence of coronary angioplasty implant and graft: Secondary | ICD-10-CM | POA: Diagnosis not present

## 2018-12-30 DIAGNOSIS — Z89021 Acquired absence of right finger(s): Secondary | ICD-10-CM | POA: Diagnosis not present

## 2018-12-30 DIAGNOSIS — Z006 Encounter for examination for normal comparison and control in clinical research program: Secondary | ICD-10-CM | POA: Diagnosis not present

## 2018-12-30 DIAGNOSIS — I440 Atrioventricular block, first degree: Secondary | ICD-10-CM | POA: Diagnosis not present

## 2018-12-30 DIAGNOSIS — Z85828 Personal history of other malignant neoplasm of skin: Secondary | ICD-10-CM | POA: Diagnosis not present

## 2019-01-01 MED ORDER — SUBDUE PLUS PO LIQD
75.00 | ORAL | Status: DC
Start: 2019-01-02 — End: 2019-01-01

## 2019-01-01 MED ORDER — GLUCOSAMINE-CHONDROIT-COLLAGEN PO
100.00 | ORAL | Status: DC
Start: ? — End: 2019-01-01

## 2019-01-01 MED ORDER — STRI-DEX MAXIMUM STRENGTH 2 % EX PADS
125.00 | MEDICATED_PAD | CUTANEOUS | Status: DC
Start: ? — End: 2019-01-01

## 2019-01-01 MED ORDER — LACTATED RINGERS IV SOLN
INTRAVENOUS | Status: DC
Start: ? — End: 2019-01-01

## 2019-01-01 MED ORDER — COMPOUND W FREEZE OFF EX AERO
2.00 | INHALATION_SPRAY | CUTANEOUS | Status: DC
Start: 2019-01-01 — End: 2019-01-01

## 2019-01-01 MED ORDER — ACETAMINOPHEN 80 MG PO CPSP
0.50 | ORAL_CAPSULE | ORAL | Status: DC
Start: ? — End: 2019-01-01

## 2019-01-01 MED ORDER — BAYER WOMENS 81-300 MG PO TABS
40.00 | ORAL_TABLET | ORAL | Status: DC
Start: 2019-01-02 — End: 2019-01-01

## 2019-01-01 MED ORDER — QUINERVA 260 MG PO TABS
650.00 | ORAL_TABLET | ORAL | Status: DC
Start: ? — End: 2019-01-01

## 2019-01-01 MED ORDER — GLUCOSE 40 % PO GEL
15.00 | ORAL | Status: DC
Start: ? — End: 2019-01-01

## 2019-01-01 MED ORDER — PHENYLEPH-POT GUAIACOLSULF
81.00 | Status: DC
Start: 2019-01-02 — End: 2019-01-01

## 2019-01-01 MED ORDER — ALFENTANIL HCL 500 MCG/ML IJ INJ
20.00 | INJECTION | INTRAMUSCULAR | Status: DC
Start: 2019-01-02 — End: 2019-01-01

## 2019-01-01 MED ORDER — MILRINONE LACTATE 20 MG/20ML IV SOLN
0.40 | INTRAVENOUS | Status: DC
Start: ? — End: 2019-01-01

## 2019-01-01 MED ORDER — EQUATE NICOTINE 4 MG MT GUM
4.00 | CHEWING_GUM | OROMUCOSAL | Status: DC
Start: ? — End: 2019-01-01

## 2019-01-01 MED ORDER — Medication
10.00 | Status: DC
Start: 2019-01-02 — End: 2019-01-01

## 2019-01-01 MED ORDER — GLYCERIN 50 % PO SOLN
ORAL | Status: DC
Start: 2019-01-01 — End: 2019-01-01

## 2019-01-01 MED ORDER — MAGNESIUM HYDROXIDE 400 MG/5ML PO SUSP
30.00 | ORAL | Status: DC
Start: ? — End: 2019-01-01

## 2019-01-01 MED ORDER — CHLOROBUTANOL-EUGENOL & APAP
0.40 | Status: DC
Start: ? — End: 2019-01-01

## 2019-01-02 DIAGNOSIS — I25119 Atherosclerotic heart disease of native coronary artery with unspecified angina pectoris: Secondary | ICD-10-CM | POA: Diagnosis not present

## 2019-01-02 DIAGNOSIS — R1013 Epigastric pain: Secondary | ICD-10-CM | POA: Diagnosis not present

## 2019-01-02 DIAGNOSIS — I440 Atrioventricular block, first degree: Secondary | ICD-10-CM | POA: Diagnosis not present

## 2019-01-02 DIAGNOSIS — R079 Chest pain, unspecified: Secondary | ICD-10-CM | POA: Diagnosis not present

## 2019-01-02 DIAGNOSIS — R072 Precordial pain: Secondary | ICD-10-CM | POA: Diagnosis not present

## 2019-01-02 DIAGNOSIS — J449 Chronic obstructive pulmonary disease, unspecified: Secondary | ICD-10-CM | POA: Diagnosis not present

## 2019-01-02 DIAGNOSIS — R7989 Other specified abnormal findings of blood chemistry: Secondary | ICD-10-CM | POA: Diagnosis not present

## 2019-01-02 DIAGNOSIS — Z951 Presence of aortocoronary bypass graft: Secondary | ICD-10-CM | POA: Diagnosis not present

## 2019-01-02 DIAGNOSIS — R110 Nausea: Secondary | ICD-10-CM | POA: Diagnosis not present

## 2019-01-02 DIAGNOSIS — R000 Tachycardia, unspecified: Secondary | ICD-10-CM | POA: Diagnosis not present

## 2019-01-02 DIAGNOSIS — I499 Cardiac arrhythmia, unspecified: Secondary | ICD-10-CM | POA: Diagnosis not present

## 2019-01-02 DIAGNOSIS — I714 Abdominal aortic aneurysm, without rupture: Secondary | ICD-10-CM | POA: Diagnosis not present

## 2019-01-02 DIAGNOSIS — R001 Bradycardia, unspecified: Secondary | ICD-10-CM | POA: Diagnosis not present

## 2019-01-02 DIAGNOSIS — R0902 Hypoxemia: Secondary | ICD-10-CM | POA: Diagnosis not present

## 2019-01-02 DIAGNOSIS — Z952 Presence of prosthetic heart valve: Secondary | ICD-10-CM | POA: Diagnosis not present

## 2019-01-02 DIAGNOSIS — I517 Cardiomegaly: Secondary | ICD-10-CM | POA: Diagnosis not present

## 2019-01-02 DIAGNOSIS — R0689 Other abnormalities of breathing: Secondary | ICD-10-CM | POA: Diagnosis not present

## 2019-01-03 DIAGNOSIS — Z952 Presence of prosthetic heart valve: Secondary | ICD-10-CM | POA: Diagnosis not present

## 2019-01-03 DIAGNOSIS — R1013 Epigastric pain: Secondary | ICD-10-CM | POA: Diagnosis not present

## 2019-01-03 DIAGNOSIS — R7989 Other specified abnormal findings of blood chemistry: Secondary | ICD-10-CM | POA: Diagnosis not present

## 2019-01-03 DIAGNOSIS — I25119 Atherosclerotic heart disease of native coronary artery with unspecified angina pectoris: Secondary | ICD-10-CM | POA: Diagnosis not present

## 2019-01-03 DIAGNOSIS — I440 Atrioventricular block, first degree: Secondary | ICD-10-CM | POA: Diagnosis not present

## 2019-01-03 MED ORDER — Medication
500.00 | Status: DC
Start: ? — End: 2019-01-03

## 2019-01-03 MED ORDER — ROSUVASTATIN CALCIUM 20 MG PO TABS
20.00 | ORAL_TABLET | ORAL | Status: DC
Start: 2019-01-04 — End: 2019-01-03

## 2019-01-03 MED ORDER — PANTOPRAZOLE SODIUM 40 MG PO TBEC
40.00 | DELAYED_RELEASE_TABLET | ORAL | Status: DC
Start: 2019-01-04 — End: 2019-01-03

## 2019-01-03 MED ORDER — INSULIN LISPRO 100 UNIT/ML ~~LOC~~ SOLN
2.00 | SUBCUTANEOUS | Status: DC
Start: 2019-01-03 — End: 2019-01-03

## 2019-01-03 MED ORDER — MAGNESIUM HYDROXIDE 400 MG/5ML PO SUSP
30.00 | ORAL | Status: DC
Start: ? — End: 2019-01-03

## 2019-01-03 MED ORDER — QUINERVA 260 MG PO TABS
650.00 | ORAL_TABLET | ORAL | Status: DC
Start: ? — End: 2019-01-03

## 2019-01-03 MED ORDER — ALBUTEROL SULFATE (2.5 MG/3ML) 0.083% IN NEBU
2.50 | INHALATION_SOLUTION | RESPIRATORY_TRACT | Status: DC
Start: ? — End: 2019-01-03

## 2019-01-03 MED ORDER — ONDANSETRON HCL 4 MG/2ML IJ SOLN
4.00 | INTRAMUSCULAR | Status: DC
Start: ? — End: 2019-01-03

## 2019-01-03 MED ORDER — STRI-DEX MAXIMUM STRENGTH 2 % EX PADS
125.00 | MEDICATED_PAD | CUTANEOUS | Status: DC
Start: ? — End: 2019-01-03

## 2019-01-03 MED ORDER — GLUCOSE 40 % PO GEL
15.00 | ORAL | Status: DC
Start: ? — End: 2019-01-03

## 2019-01-03 MED ORDER — Medication
Status: DC
Start: ? — End: 2019-01-03

## 2019-01-03 MED ORDER — GLYCERIN 50 % PO SOLN
ORAL | Status: DC
Start: 2019-01-03 — End: 2019-01-03

## 2019-01-03 MED ORDER — PHENYLEPH-POT GUAIACOLSULF
81.00 | Status: DC
Start: 2019-01-04 — End: 2019-01-03

## 2019-01-03 MED ORDER — CHLOROBUTANOL-EUGENOL & APAP
0.40 | Status: DC
Start: ? — End: 2019-01-03

## 2019-01-03 MED ORDER — SUBDUE PLUS PO LIQD
75.00 | ORAL | Status: DC
Start: 2019-01-04 — End: 2019-01-03

## 2019-01-03 MED ORDER — Medication
10.00 | Status: DC
Start: 2019-01-04 — End: 2019-01-03

## 2019-01-03 MED ORDER — CVS EAR DROPS OT
40.00 | OTIC | Status: DC
Start: 2019-01-04 — End: 2019-01-03

## 2019-01-03 MED ORDER — Medication
2.00 | Status: DC
Start: 2019-01-03 — End: 2019-01-03

## 2019-01-03 MED ORDER — DOCUSATE SODIUM 100 MG PO CAPS
100.00 | ORAL_CAPSULE | ORAL | Status: DC
Start: ? — End: 2019-01-03

## 2019-01-04 ENCOUNTER — Telehealth: Payer: Self-pay | Admitting: Family Medicine

## 2019-01-04 DIAGNOSIS — Z7984 Long term (current) use of oral hypoglycemic drugs: Secondary | ICD-10-CM | POA: Diagnosis not present

## 2019-01-04 DIAGNOSIS — K8000 Calculus of gallbladder with acute cholecystitis without obstruction: Secondary | ICD-10-CM | POA: Diagnosis not present

## 2019-01-04 DIAGNOSIS — T82330A Leakage of aortic (bifurcation) graft (replacement), initial encounter: Secondary | ICD-10-CM | POA: Diagnosis not present

## 2019-01-04 DIAGNOSIS — R0689 Other abnormalities of breathing: Secondary | ICD-10-CM | POA: Diagnosis not present

## 2019-01-04 DIAGNOSIS — G4733 Obstructive sleep apnea (adult) (pediatric): Secondary | ICD-10-CM | POA: Diagnosis not present

## 2019-01-04 DIAGNOSIS — Z952 Presence of prosthetic heart valve: Secondary | ICD-10-CM | POA: Diagnosis not present

## 2019-01-04 DIAGNOSIS — Z87891 Personal history of nicotine dependence: Secondary | ICD-10-CM | POA: Diagnosis not present

## 2019-01-04 DIAGNOSIS — R52 Pain, unspecified: Secondary | ICD-10-CM | POA: Diagnosis not present

## 2019-01-04 DIAGNOSIS — I252 Old myocardial infarction: Secondary | ICD-10-CM | POA: Diagnosis not present

## 2019-01-04 DIAGNOSIS — K8020 Calculus of gallbladder without cholecystitis without obstruction: Secondary | ICD-10-CM | POA: Diagnosis not present

## 2019-01-04 DIAGNOSIS — D649 Anemia, unspecified: Secondary | ICD-10-CM | POA: Diagnosis not present

## 2019-01-04 DIAGNOSIS — I350 Nonrheumatic aortic (valve) stenosis: Secondary | ICD-10-CM | POA: Diagnosis not present

## 2019-01-04 DIAGNOSIS — Z95828 Presence of other vascular implants and grafts: Secondary | ICD-10-CM | POA: Diagnosis not present

## 2019-01-04 DIAGNOSIS — I10 Essential (primary) hypertension: Secondary | ICD-10-CM | POA: Diagnosis not present

## 2019-01-04 DIAGNOSIS — E8809 Other disorders of plasma-protein metabolism, not elsewhere classified: Secondary | ICD-10-CM | POA: Diagnosis not present

## 2019-01-04 DIAGNOSIS — R1011 Right upper quadrant pain: Secondary | ICD-10-CM | POA: Diagnosis not present

## 2019-01-04 DIAGNOSIS — Z9981 Dependence on supplemental oxygen: Secondary | ICD-10-CM | POA: Diagnosis not present

## 2019-01-04 DIAGNOSIS — N3289 Other specified disorders of bladder: Secondary | ICD-10-CM | POA: Diagnosis not present

## 2019-01-04 DIAGNOSIS — Z954 Presence of other heart-valve replacement: Secondary | ICD-10-CM | POA: Diagnosis not present

## 2019-01-04 DIAGNOSIS — R079 Chest pain, unspecified: Secondary | ICD-10-CM | POA: Diagnosis not present

## 2019-01-04 DIAGNOSIS — R112 Nausea with vomiting, unspecified: Secondary | ICD-10-CM | POA: Diagnosis not present

## 2019-01-04 DIAGNOSIS — K810 Acute cholecystitis: Secondary | ICD-10-CM | POA: Diagnosis not present

## 2019-01-04 DIAGNOSIS — K567 Ileus, unspecified: Secondary | ICD-10-CM | POA: Diagnosis not present

## 2019-01-04 DIAGNOSIS — I5032 Chronic diastolic (congestive) heart failure: Secondary | ICD-10-CM | POA: Diagnosis not present

## 2019-01-04 DIAGNOSIS — R109 Unspecified abdominal pain: Secondary | ICD-10-CM | POA: Diagnosis not present

## 2019-01-04 DIAGNOSIS — K828 Other specified diseases of gallbladder: Secondary | ICD-10-CM | POA: Diagnosis not present

## 2019-01-04 DIAGNOSIS — J449 Chronic obstructive pulmonary disease, unspecified: Secondary | ICD-10-CM | POA: Diagnosis not present

## 2019-01-04 DIAGNOSIS — I440 Atrioventricular block, first degree: Secondary | ICD-10-CM | POA: Diagnosis not present

## 2019-01-04 DIAGNOSIS — R791 Abnormal coagulation profile: Secondary | ICD-10-CM | POA: Diagnosis not present

## 2019-01-04 DIAGNOSIS — R0602 Shortness of breath: Secondary | ICD-10-CM | POA: Diagnosis not present

## 2019-01-04 DIAGNOSIS — E119 Type 2 diabetes mellitus without complications: Secondary | ICD-10-CM | POA: Diagnosis not present

## 2019-01-04 DIAGNOSIS — R069 Unspecified abnormalities of breathing: Secondary | ICD-10-CM | POA: Diagnosis not present

## 2019-01-04 DIAGNOSIS — I714 Abdominal aortic aneurysm, without rupture: Secondary | ICD-10-CM | POA: Diagnosis not present

## 2019-01-04 DIAGNOSIS — N170 Acute kidney failure with tubular necrosis: Secondary | ICD-10-CM | POA: Diagnosis not present

## 2019-01-04 DIAGNOSIS — N179 Acute kidney failure, unspecified: Secondary | ICD-10-CM | POA: Diagnosis not present

## 2019-01-04 DIAGNOSIS — K219 Gastro-esophageal reflux disease without esophagitis: Secondary | ICD-10-CM | POA: Diagnosis not present

## 2019-01-04 DIAGNOSIS — Z9889 Other specified postprocedural states: Secondary | ICD-10-CM | POA: Diagnosis not present

## 2019-01-04 NOTE — Telephone Encounter (Signed)
Televisit scheduled 01/05/2019 at 10:30 with Dr. Lajuana Ripple.

## 2019-01-05 ENCOUNTER — Telehealth: Payer: Medicare HMO | Admitting: Family Medicine

## 2019-01-05 MED ORDER — ESCITALOPRAM OXALATE 10 MG PO TABS
10.00 | ORAL_TABLET | ORAL | Status: DC
Start: 2019-01-15 — End: 2019-01-05

## 2019-01-05 MED ORDER — HEPARIN SODIUM (PORCINE) 5000 UNIT/ML IJ SOLN
5000.00 | INTRAMUSCULAR | Status: DC
Start: 2019-01-05 — End: 2019-01-05

## 2019-01-05 MED ORDER — BUDESONIDE-FORMOTEROL FUMARATE 80-4.5 MCG/ACT IN AERO
2.00 | INHALATION_SPRAY | RESPIRATORY_TRACT | Status: DC
Start: 2019-01-05 — End: 2019-01-05

## 2019-01-05 MED ORDER — ROSUVASTATIN CALCIUM 20 MG PO TABS
20.00 | ORAL_TABLET | ORAL | Status: DC
Start: 2019-01-06 — End: 2019-01-05

## 2019-01-05 MED ORDER — ACETAMINOPHEN 80 MG PO CPSP
0.50 | ORAL_CAPSULE | ORAL | Status: DC
Start: ? — End: 2019-01-05

## 2019-01-05 MED ORDER — ACETAMINOPHEN 325 MG PO TABS
650.00 | ORAL_TABLET | ORAL | Status: DC
Start: ? — End: 2019-01-05

## 2019-01-05 MED ORDER — BAYER WOMENS 81-300 MG PO TABS
40.00 | ORAL_TABLET | ORAL | Status: DC
Start: 2019-01-05 — End: 2019-01-05

## 2019-01-05 MED ORDER — GLUCOSE 40 % PO GEL
15.00 | ORAL | Status: DC
Start: ? — End: 2019-01-05

## 2019-01-05 MED ORDER — ASTELIN 137 MCG/SPRAY NA SOLN
40.00 | NASAL | Status: DC
Start: 2019-01-06 — End: 2019-01-05

## 2019-01-05 MED ORDER — Medication
5.00 | Status: DC
Start: ? — End: 2019-01-05

## 2019-01-05 MED ORDER — ASPIRIN EC 81 MG PO TBEC
81.00 | DELAYED_RELEASE_TABLET | ORAL | Status: DC
Start: 2019-01-15 — End: 2019-01-05

## 2019-01-05 MED ORDER — CLOPIDOGREL BISULFATE 75 MG PO TABS
75.00 | ORAL_TABLET | ORAL | Status: DC
Start: 2019-01-15 — End: 2019-01-05

## 2019-01-05 MED ORDER — GENERIC EXTERNAL MEDICATION
500.00 | Status: DC
Start: ? — End: 2019-01-05

## 2019-01-05 MED ORDER — COMPOUND W FREEZE OFF EX AERO
2.00 | INHALATION_SPRAY | CUTANEOUS | Status: DC
Start: 2019-01-14 — End: 2019-01-05

## 2019-01-05 MED ORDER — Medication
3.38 | Status: DC
Start: 2019-01-05 — End: 2019-01-05

## 2019-01-05 MED ORDER — STRI-DEX MAXIMUM STRENGTH 2 % EX PADS
125.00 | MEDICATED_PAD | CUTANEOUS | Status: DC
Start: ? — End: 2019-01-05

## 2019-01-05 MED ORDER — BARO-CAT PO
ORAL | Status: DC
Start: ? — End: 2019-01-05

## 2019-01-14 MED ORDER — PYRITHIONE ZINC 1 % EX SHAM
0.02 | MEDICATED_SHAMPOO | CUTANEOUS | Status: DC
Start: ? — End: 2019-01-14

## 2019-01-14 MED ORDER — Medication
10.00 | Status: DC
Start: 2019-01-14 — End: 2019-01-14

## 2019-01-14 MED ORDER — Medication
2.00 | Status: DC
Start: 2019-01-14 — End: 2019-01-14

## 2019-01-14 MED ORDER — CELLULOSE SODIUM PHOSPHATE VI
5000.00 | Status: DC
Start: 2019-01-14 — End: 2019-01-14

## 2019-01-14 MED ORDER — CESIUM CHLORIDE (TRACE MINERALS)
20.00 | Status: DC
Start: 2019-01-14 — End: 2019-01-14

## 2019-01-14 MED ORDER — IPRATROPIUM-ALBUTEROL 0.5-2.5 (3) MG/3ML IN SOLN
3.00 | RESPIRATORY_TRACT | Status: DC
Start: ? — End: 2019-01-14

## 2019-01-14 MED ORDER — FERROUS SULFATE 325 (65 FE) MG PO TABS
325.00 | ORAL_TABLET | ORAL | Status: DC
Start: 2019-01-15 — End: 2019-01-14

## 2019-01-14 MED ORDER — MAGNESIUM OXIDE 400 MG PO TABS
400.00 | ORAL_TABLET | ORAL | Status: DC
Start: 2019-01-14 — End: 2019-01-14

## 2019-01-14 MED ORDER — STRI-DEX MAXIMUM STRENGTH 2 % EX PADS
125.00 | MEDICATED_PAD | CUTANEOUS | Status: DC
Start: ? — End: 2019-01-14

## 2019-01-14 MED ORDER — PANTOPRAZOLE SODIUM 40 MG PO TBEC
40.00 | DELAYED_RELEASE_TABLET | ORAL | Status: DC
Start: 2019-01-15 — End: 2019-01-14

## 2019-01-14 MED ORDER — BISACODYL 10 MG RE SUPP
10.00 | RECTAL | Status: DC
Start: ? — End: 2019-01-14

## 2019-01-17 DIAGNOSIS — I5030 Unspecified diastolic (congestive) heart failure: Secondary | ICD-10-CM | POA: Diagnosis not present

## 2019-01-17 DIAGNOSIS — K8000 Calculus of gallbladder with acute cholecystitis without obstruction: Secondary | ICD-10-CM | POA: Diagnosis not present

## 2019-01-17 DIAGNOSIS — N179 Acute kidney failure, unspecified: Secondary | ICD-10-CM | POA: Diagnosis not present

## 2019-01-17 DIAGNOSIS — E119 Type 2 diabetes mellitus without complications: Secondary | ICD-10-CM | POA: Diagnosis not present

## 2019-01-17 DIAGNOSIS — I2510 Atherosclerotic heart disease of native coronary artery without angina pectoris: Secondary | ICD-10-CM | POA: Diagnosis not present

## 2019-01-17 DIAGNOSIS — Z4803 Encounter for change or removal of drains: Secondary | ICD-10-CM | POA: Diagnosis not present

## 2019-01-17 DIAGNOSIS — I714 Abdominal aortic aneurysm, without rupture: Secondary | ICD-10-CM | POA: Diagnosis not present

## 2019-01-17 DIAGNOSIS — J449 Chronic obstructive pulmonary disease, unspecified: Secondary | ICD-10-CM | POA: Diagnosis not present

## 2019-01-17 DIAGNOSIS — D649 Anemia, unspecified: Secondary | ICD-10-CM | POA: Diagnosis not present

## 2019-01-17 DIAGNOSIS — I110 Hypertensive heart disease with heart failure: Secondary | ICD-10-CM | POA: Diagnosis not present

## 2019-01-18 ENCOUNTER — Telehealth: Payer: Self-pay | Admitting: Family Medicine

## 2019-01-18 DIAGNOSIS — J449 Chronic obstructive pulmonary disease, unspecified: Secondary | ICD-10-CM | POA: Diagnosis not present

## 2019-01-18 DIAGNOSIS — N179 Acute kidney failure, unspecified: Secondary | ICD-10-CM | POA: Diagnosis not present

## 2019-01-18 DIAGNOSIS — I714 Abdominal aortic aneurysm, without rupture: Secondary | ICD-10-CM | POA: Diagnosis not present

## 2019-01-18 DIAGNOSIS — K8000 Calculus of gallbladder with acute cholecystitis without obstruction: Secondary | ICD-10-CM | POA: Diagnosis not present

## 2019-01-18 DIAGNOSIS — Z4803 Encounter for change or removal of drains: Secondary | ICD-10-CM | POA: Diagnosis not present

## 2019-01-18 DIAGNOSIS — E119 Type 2 diabetes mellitus without complications: Secondary | ICD-10-CM | POA: Diagnosis not present

## 2019-01-18 DIAGNOSIS — I5030 Unspecified diastolic (congestive) heart failure: Secondary | ICD-10-CM | POA: Diagnosis not present

## 2019-01-18 DIAGNOSIS — I2510 Atherosclerotic heart disease of native coronary artery without angina pectoris: Secondary | ICD-10-CM | POA: Diagnosis not present

## 2019-01-18 DIAGNOSIS — D649 Anemia, unspecified: Secondary | ICD-10-CM | POA: Diagnosis not present

## 2019-01-18 DIAGNOSIS — I110 Hypertensive heart disease with heart failure: Secondary | ICD-10-CM | POA: Diagnosis not present

## 2019-01-18 NOTE — Telephone Encounter (Signed)
Patient already had 3 month follow up appointment scheduled, added 15 minutes to that slot for a hospital follow up

## 2019-01-19 ENCOUNTER — Other Ambulatory Visit: Payer: Self-pay

## 2019-01-19 DIAGNOSIS — K810 Acute cholecystitis: Secondary | ICD-10-CM | POA: Diagnosis not present

## 2019-01-20 ENCOUNTER — Ambulatory Visit (INDEPENDENT_AMBULATORY_CARE_PROVIDER_SITE_OTHER): Payer: Medicare HMO | Admitting: Family Medicine

## 2019-01-20 ENCOUNTER — Encounter: Payer: Self-pay | Admitting: Family Medicine

## 2019-01-20 VITALS — BP 128/51 | HR 62 | Temp 98.4°F | Ht 68.0 in | Wt 207.0 lb

## 2019-01-20 DIAGNOSIS — N179 Acute kidney failure, unspecified: Secondary | ICD-10-CM | POA: Diagnosis not present

## 2019-01-20 DIAGNOSIS — E119 Type 2 diabetes mellitus without complications: Secondary | ICD-10-CM | POA: Diagnosis not present

## 2019-01-20 DIAGNOSIS — G4730 Sleep apnea, unspecified: Secondary | ICD-10-CM

## 2019-01-20 DIAGNOSIS — K810 Acute cholecystitis: Secondary | ICD-10-CM | POA: Diagnosis not present

## 2019-01-20 DIAGNOSIS — Z09 Encounter for follow-up examination after completed treatment for conditions other than malignant neoplasm: Secondary | ICD-10-CM

## 2019-01-20 LAB — BAYER DCA HB A1C WAIVED: HB A1C (BAYER DCA - WAIVED): 8.2 % — ABNORMAL HIGH (ref <7.0)

## 2019-01-20 NOTE — Progress Notes (Signed)
Subjective: Matthew Campos discharge PCP: Matthew Norlander, DO [name] is a 78 y.o. male, who is accompanied by his wife and is presenting to clinic today for:  1.  Acute cholecystitis Patient was admitted to the hospital and discharged on 01/14/2019 for acute cholecystitis.  He was treated with IV Zosyn and IR was consulted for perc cholecystotomy.  He was also noted to have an acute renal injury with creatinine of 1.71.  There was concern for possible cardiorenal syndrome and he was diuresed.  At time of discharge his renal function was improving and was nearing baseline.  Outpatient urology follow-up was recommended given bladder nodule. Magnesium and BMP recommended at follow-up  He has been referred to nephrology and has an appointment on 10/22.  His wife notes that he will need a formal referral for insurance coverage.  He has been off his torsemide, Prandin, glipizide and Metformin since discharge.  They instructed him not to start the medicine again until he followed up with his PCP.  He does note some lower extremity edema and admits to eating bacon recently.  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  His next appointment with cardiology is at the end of the month as well.  He has follow-up with interventional radiology on 11/10; he is status post replacement of his drain yesterday after it was inadvertently pulled out.  He has home health RN and home health PT coming in to help him.  Overall he is tolerating p.o. intake without difficulty and feeling okay.  He is still somewhat weak but again is working with the physical therapist as above.  2.  Sleep difficulties Patient reports both difficulty getting to sleep and staying asleep.  He will often wake up after only a few minutes of rest and have difficulty falling back asleep.  His wife chimes in and notes that he will often pause in breathing and she has to nudge him to start breathing again.  He does state that  he used to have a CPAP machine but this was "taken away from him" because he had bronchitis and was unable to tolerate it for a while.  He has not had a sleep study in over 10 years.  Again, history of heart failure.  Status post TAVR.  History of COPD as well.  ROS: Per HPI  Allergies  Allergen Reactions  . Codeine Nausea And Vomiting  . Latex Hives and Itching  . Lisinopril Cough   Past Medical History:  Diagnosis Date  . AAA (abdominal aortic aneurysm) without rupture ([place])    repaired  . Aortic stenosis   . CHF (congestive heart failure) ([place])   . Complication of anesthesia    hard to be put to sleep  . COPD (chronic obstructive pulmonary disease) ([place])   . Coronary artery disease   . Diabetes mellitus without complication ([place])    takes Januvia and Metformin daily  . GERD (gastroesophageal reflux disease)    takes omeprazole daily  . Headache(784.0)    sinus  . History of bronchitis    last time >20yr ago  . Hyperlipidemia    takes Lipitor daily  . Hypertension    takes Metoprolol daily  . Joint pain   . Myocardial infarction ([place]    x 3;last one about 3-4102yrago  . Pneumonia    last itme about 9y80yrgo    Current Outpatient Medications:  .  albuterol (PROVENTIL) (2.5 MG/3ML) 0.083% nebulizer solution,  Take 2.5 mg by nebulization every 6 (six) hours as needed for wheezing or shortness of breath., Disp: , Rfl:  .  albuterol (VENTOLIN HFA) 108 (90 Base) MCG/ACT inhaler, Inhale 2 puffs into the lungs every 6 (six) hours as needed for wheezing or shortness of breath., Disp: 18 g, Rfl: 3 .  aspirin 81 MG chewable tablet, Chew 81 mg by mouth daily., Disp: , Rfl:  .  Blood Glucose Monitoring Suppl (ONETOUCH VERIO) w/Device KIT, TID, Disp: , Rfl:  .  budesonide-formoterol (SYMBICORT) 80-4.5 MCG/ACT inhaler, Inhale 2 puffs into the lungs 2 (two) times daily., Disp: 3 Inhaler, Rfl: 1 .  Calcium Carbonate-Simethicone 750-80 MG CHEW, Chew 1 tablet by mouth as needed., Disp: ,  Rfl:  .  clopidogrel (PLAVIX) 75 MG tablet, Take 1 tablet (75 mg total) by mouth daily., Disp: 90 tablet, Rfl: 1 .  glucose blood (ONETOUCH VERIO) test strip, TID Dx E11.9, Disp: , Rfl:  .  metFORMIN (GLUCOPHAGE XR) 500 MG 24 hr tablet, Take 2 tablets (1,000 mg total) by mouth 2 (two) times daily., Disp: 360 tablet, Rfl: 3 .  metoprolol tartrate (LOPRESSOR) 50 MG tablet, Take 1 tablet (50 mg total) by mouth 2 (two) times daily., Disp: 180 tablet, Rfl: 1 .  Multiple Vitamin (MULTIVITAMIN WITH MINERALS) TABS, Take 1 tablet by mouth daily., Disp: , Rfl:  .  nitroGLYCERIN (NITROSTAT) 0.4 MG SL tablet, Place 0.4 mg under the tongue every 5 (five) minutes as needed. For chest pain, Disp: , Rfl:  .  ofloxacin (OCUFLOX) 0.3 % ophthalmic solution, every morning., Disp: , Rfl:  .  omeprazole (PRILOSEC) 40 MG capsule, Take 1 capsule (40 mg total) by mouth daily., Disp: 90 capsule, Rfl: 1 .  ondansetron (ZOFRAN ODT) 4 MG disintegrating tablet, Take 1 tablet (4 mg total) by mouth every 8 (eight) hours as needed for nausea or vomiting., Disp: 20 tablet, Rfl: 0 .  rosuvastatin (CRESTOR) 20 MG tablet, Take 1 tablet (20 mg total) by mouth daily., Disp: 90 tablet, Rfl: 1 .  sertraline (ZOLOFT) 25 MG tablet, Take 1 tablet (25 mg total) by mouth daily., Disp: 30 tablet, Rfl: 2 .  sitaGLIPtin (JANUVIA) 50 MG tablet, Take 1 tablet (50 mg total) by mouth 2 (two) times a day., Disp: 180 tablet, Rfl: 1 .  torsemide (DEMADEX) 20 MG tablet, Take 2 tablets (40 mg total) by mouth 2 (two) times daily., Disp: 180 tablet, Rfl: 1 Social History   Socioeconomic History  . Marital status: Married    Spouse name: Not on file  . Number of children: Not on file  . Years of education: Not on file  . Highest education level: Not on file  Occupational History  . Not on file  Social Needs  . Financial resource strain: Not on file  . Food insecurity    Worry: Not on file    Inability: Not on file  . Transportation needs     Medical: Not on file    Non-medical: Not on file  Tobacco Use  . Smoking status: Former Research scientist (life sciences)  . Smokeless tobacco: Never Used  . Tobacco comment: quit smoking 20+yrs ago  Substance and Sexual Activity  . Alcohol use: No  . Drug use: No  . Sexual activity: Yes  Lifestyle  . Physical activity    Days per week: Not on file    Minutes per session: Not on file  . Stress: Not on file  Relationships  . Social connections    Talks  on phone: Not on file    Gets together: Not on file    Attends religious service: Not on file    Active member of club or organization: Not on file    Attends meetings of clubs or organizations: Not on file    Relationship status: Not on file  . Intimate partner violence    Fear of current or ex partner: Not on file    Emotionally abused: Not on file    Physically abused: Not on file    Forced sexual activity: Not on file  Other Topics Concern  . Not on file  Social History Narrative  . Not on file   Family History  Problem Relation Age of Onset  . Cancer Mother        Unknown type  . Heart disease Father   . Diabetes Father   . Heart disease Sister   . Seizures Brother   . Diabetes Daughter   . Diabetes Daughter   . Heart attack Son     Objective: Office vital signs reviewed. BP (!) 128/51   Pulse 62   Temp 98.4 F (36.9 C) (Temporal)   Ht '5\' 8"'$  (1.727 m)   Wt 207 lb (93.9 kg)   SpO2 94%   BMI 31.47 kg/m   Physical Examination:  General: Awake, alert, chronically ill appearing. Nontoxic, No acute distress HEENT: Normal.  Sclera white Cardio: regular rate and rhythm, Q7M2 heard, systolic murmur heard at bilateral sternal borders with radiation into the carotids Pulm: Air movement fair.  Clear to auscultation bilaterally, no wheezes, rhonchi or rales; normal work of breathing on room air GI: Has percutaneous tube in place with active brown, reddish drainage. Extremities: warm, well perfused, 1+ pitting edema to bilateral knees.  No  cyanosis or clubbing; +2 pulses bilaterally  Assessment/ Plan: 78 y.o. male   1. Hospital discharge follow-up I reviewed the records that were available in care everywhere.  I placed a referral to nephrology as requested by his spouse.  They have an appointment in place already.  He seems to be well connected with the outpatient specialist he needs going forward after his discharge from the hospital. - Ambulatory referral to Nephrology  2. Acute cholecystitis Stable.  Continue follow-up with IR - Basic Metabolic Panel - Magnesium - CBC  3. AKI (acute kidney injury) ([place]) We will check BMP, magnesium. - Basic Metabolic Panel - Magnesium - Ambulatory referral to Nephrology  4. Diabetes mellitus without complication (HCC) U6J is not at goal.  This is a change from previous checkup and likely reflective of significant illness over the last month and a half.  No changes to medications for now.  Will await above laboratory work-up before resuming sugar medications. - Bayer DCA Hb A1c Waived  5. Observed sleep apnea I placed a referral to neurology for sleep study and resuming CPAP machine.  Given his heart history this is likely what he needs to get better sleep.  Okay to use low-dose melatonin if needed in the interim. - Ambulatory referral to Neurology   Orders Placed This Encounter  Procedures  . Bayer DCA Hb A1c Waived  . Basic Metabolic Panel  . Magnesium  . CBC  . Ambulatory referral to Nephrology    Referral Priority:   Routine    Referral Type:   Consultation    Referral Reason:   Specialty Services Required    Requested Specialty:   Nephrology    Number of Visits Requested:  1  . Ambulatory referral to Neurology    Referral Priority:   Routine    Referral Type:   Consultation    Referral Reason:   Specialty Services Required    Requested Specialty:   Neurology    Number of Visits Requested:   1   No orders of the defined types were placed in this encounter.     Matthew Norlander, DO [place] [phone]

## 2019-01-20 NOTE — Patient Instructions (Signed)
You had labs performed today.  You will be contacted with the results of the labs once they are available, usually in the next 3 business days for routine lab work.  If you have an active my chart account, they will be released to your MyChart.  If you prefer to have these labs released to you via telephone, please let us know.  If you had a pap smear or biopsy performed, expect to be contacted in about 7-10 days.  Melatonin ok for sleep.  Start with 1mg  at bedtime.  May increase to max dose of 8mg  at bedtime.  I think sleep apnea is the real issue here and referral has been placed for sleep study.

## 2019-01-21 ENCOUNTER — Other Ambulatory Visit: Payer: Self-pay | Admitting: Family

## 2019-01-21 DIAGNOSIS — E119 Type 2 diabetes mellitus without complications: Secondary | ICD-10-CM | POA: Diagnosis not present

## 2019-01-21 DIAGNOSIS — Z4803 Encounter for change or removal of drains: Secondary | ICD-10-CM | POA: Diagnosis not present

## 2019-01-21 DIAGNOSIS — D649 Anemia, unspecified: Secondary | ICD-10-CM | POA: Diagnosis not present

## 2019-01-21 DIAGNOSIS — J449 Chronic obstructive pulmonary disease, unspecified: Secondary | ICD-10-CM | POA: Diagnosis not present

## 2019-01-21 DIAGNOSIS — K8000 Calculus of gallbladder with acute cholecystitis without obstruction: Secondary | ICD-10-CM | POA: Diagnosis not present

## 2019-01-21 DIAGNOSIS — I110 Hypertensive heart disease with heart failure: Secondary | ICD-10-CM | POA: Diagnosis not present

## 2019-01-21 DIAGNOSIS — N179 Acute kidney failure, unspecified: Secondary | ICD-10-CM | POA: Diagnosis not present

## 2019-01-21 DIAGNOSIS — I714 Abdominal aortic aneurysm, without rupture: Secondary | ICD-10-CM | POA: Diagnosis not present

## 2019-01-21 DIAGNOSIS — I5030 Unspecified diastolic (congestive) heart failure: Secondary | ICD-10-CM | POA: Diagnosis not present

## 2019-01-21 DIAGNOSIS — I2510 Atherosclerotic heart disease of native coronary artery without angina pectoris: Secondary | ICD-10-CM | POA: Diagnosis not present

## 2019-01-21 LAB — MAGNESIUM: Magnesium: 1.6 mg/dL (ref 1.6–2.3)

## 2019-01-21 LAB — BASIC METABOLIC PANEL
BUN/Creatinine Ratio: 13 (ref 10–24)
BUN: 21 mg/dL (ref 8–27)
CO2: 28 mmol/L (ref 20–29)
Calcium: 8.6 mg/dL (ref 8.6–10.2)
Chloride: 99 mmol/L (ref 96–106)
Creatinine, Ser: 1.65 mg/dL — ABNORMAL HIGH (ref 0.76–1.27)
GFR calc Af Amer: 45 mL/min/{1.73_m2} — ABNORMAL LOW (ref >59)
GFR calc non Af Amer: 39 mL/min/{1.73_m2} — ABNORMAL LOW (ref >59)
Glucose: 209 mg/dL — ABNORMAL HIGH (ref 65–99)
Potassium: 5 mmol/L (ref 3.5–5.2)
Sodium: 140 mmol/L (ref 134–144)

## 2019-01-21 LAB — CBC
Hematocrit: 22.6 % — ABNORMAL LOW (ref 37.5–51.0)
Hemoglobin: 7.3 g/dL — CL (ref 13.0–17.7)
MCH: 30.2 pg (ref 26.6–33.0)
MCHC: 32.3 g/dL (ref 31.5–35.7)
MCV: 93 fL (ref 79–97)
Platelets: 143 10*3/uL — ABNORMAL LOW (ref 150–450)
RBC: 2.42 x10E6/uL — CL (ref 4.14–5.80)
RDW: 15.9 % — ABNORMAL HIGH (ref 11.6–15.4)
WBC: 6.3 10*3/uL (ref 3.4–10.8)

## 2019-01-22 ENCOUNTER — Other Ambulatory Visit: Payer: Self-pay | Admitting: Family

## 2019-01-22 ENCOUNTER — Other Ambulatory Visit: Payer: Self-pay

## 2019-01-22 DIAGNOSIS — Z09 Encounter for follow-up examination after completed treatment for conditions other than malignant neoplasm: Secondary | ICD-10-CM

## 2019-01-22 DIAGNOSIS — I509 Heart failure, unspecified: Secondary | ICD-10-CM

## 2019-01-22 DIAGNOSIS — I2510 Atherosclerotic heart disease of native coronary artery without angina pectoris: Secondary | ICD-10-CM

## 2019-01-22 DIAGNOSIS — I10 Essential (primary) hypertension: Secondary | ICD-10-CM

## 2019-01-22 NOTE — Progress Notes (Signed)
Placed future order per result note for CBC. MPulliam, CMA/RT(R)

## 2019-01-25 DIAGNOSIS — I2510 Atherosclerotic heart disease of native coronary artery without angina pectoris: Secondary | ICD-10-CM | POA: Diagnosis not present

## 2019-01-25 DIAGNOSIS — K8000 Calculus of gallbladder with acute cholecystitis without obstruction: Secondary | ICD-10-CM | POA: Diagnosis not present

## 2019-01-25 DIAGNOSIS — J449 Chronic obstructive pulmonary disease, unspecified: Secondary | ICD-10-CM | POA: Diagnosis not present

## 2019-01-25 DIAGNOSIS — D414 Neoplasm of uncertain behavior of bladder: Secondary | ICD-10-CM | POA: Insufficient documentation

## 2019-01-25 DIAGNOSIS — D649 Anemia, unspecified: Secondary | ICD-10-CM | POA: Diagnosis not present

## 2019-01-25 DIAGNOSIS — I714 Abdominal aortic aneurysm, without rupture: Secondary | ICD-10-CM | POA: Diagnosis not present

## 2019-01-25 DIAGNOSIS — E119 Type 2 diabetes mellitus without complications: Secondary | ICD-10-CM | POA: Diagnosis not present

## 2019-01-25 DIAGNOSIS — N179 Acute kidney failure, unspecified: Secondary | ICD-10-CM | POA: Diagnosis not present

## 2019-01-25 DIAGNOSIS — Z4803 Encounter for change or removal of drains: Secondary | ICD-10-CM | POA: Diagnosis not present

## 2019-01-25 DIAGNOSIS — I110 Hypertensive heart disease with heart failure: Secondary | ICD-10-CM | POA: Diagnosis not present

## 2019-01-25 DIAGNOSIS — I5030 Unspecified diastolic (congestive) heart failure: Secondary | ICD-10-CM | POA: Diagnosis not present

## 2019-01-26 DIAGNOSIS — N179 Acute kidney failure, unspecified: Secondary | ICD-10-CM | POA: Diagnosis not present

## 2019-01-26 DIAGNOSIS — I110 Hypertensive heart disease with heart failure: Secondary | ICD-10-CM | POA: Diagnosis not present

## 2019-01-26 DIAGNOSIS — K8000 Calculus of gallbladder with acute cholecystitis without obstruction: Secondary | ICD-10-CM | POA: Diagnosis not present

## 2019-01-26 DIAGNOSIS — E119 Type 2 diabetes mellitus without complications: Secondary | ICD-10-CM | POA: Diagnosis not present

## 2019-01-26 DIAGNOSIS — J449 Chronic obstructive pulmonary disease, unspecified: Secondary | ICD-10-CM | POA: Diagnosis not present

## 2019-01-26 DIAGNOSIS — Z4803 Encounter for change or removal of drains: Secondary | ICD-10-CM | POA: Diagnosis not present

## 2019-01-26 DIAGNOSIS — I2510 Atherosclerotic heart disease of native coronary artery without angina pectoris: Secondary | ICD-10-CM | POA: Diagnosis not present

## 2019-01-26 DIAGNOSIS — I714 Abdominal aortic aneurysm, without rupture: Secondary | ICD-10-CM | POA: Diagnosis not present

## 2019-01-26 DIAGNOSIS — I5030 Unspecified diastolic (congestive) heart failure: Secondary | ICD-10-CM | POA: Diagnosis not present

## 2019-01-26 DIAGNOSIS — D649 Anemia, unspecified: Secondary | ICD-10-CM | POA: Diagnosis not present

## 2019-01-27 DIAGNOSIS — Z4803 Encounter for change or removal of drains: Secondary | ICD-10-CM | POA: Diagnosis not present

## 2019-01-27 DIAGNOSIS — D649 Anemia, unspecified: Secondary | ICD-10-CM | POA: Diagnosis not present

## 2019-01-27 DIAGNOSIS — I714 Abdominal aortic aneurysm, without rupture: Secondary | ICD-10-CM | POA: Diagnosis not present

## 2019-01-27 DIAGNOSIS — I5030 Unspecified diastolic (congestive) heart failure: Secondary | ICD-10-CM | POA: Diagnosis not present

## 2019-01-27 DIAGNOSIS — I110 Hypertensive heart disease with heart failure: Secondary | ICD-10-CM | POA: Diagnosis not present

## 2019-01-27 DIAGNOSIS — J449 Chronic obstructive pulmonary disease, unspecified: Secondary | ICD-10-CM | POA: Diagnosis not present

## 2019-01-27 DIAGNOSIS — N179 Acute kidney failure, unspecified: Secondary | ICD-10-CM | POA: Diagnosis not present

## 2019-01-27 DIAGNOSIS — I2510 Atherosclerotic heart disease of native coronary artery without angina pectoris: Secondary | ICD-10-CM | POA: Diagnosis not present

## 2019-01-27 DIAGNOSIS — E119 Type 2 diabetes mellitus without complications: Secondary | ICD-10-CM | POA: Diagnosis not present

## 2019-01-27 DIAGNOSIS — K8000 Calculus of gallbladder with acute cholecystitis without obstruction: Secondary | ICD-10-CM | POA: Diagnosis not present

## 2019-01-28 ENCOUNTER — Ambulatory Visit: Payer: Medicare HMO | Admitting: Family Medicine

## 2019-01-28 ENCOUNTER — Encounter: Payer: Self-pay | Admitting: Family Medicine

## 2019-01-28 DIAGNOSIS — R531 Weakness: Secondary | ICD-10-CM | POA: Diagnosis not present

## 2019-01-28 DIAGNOSIS — J449 Chronic obstructive pulmonary disease, unspecified: Secondary | ICD-10-CM | POA: Diagnosis not present

## 2019-01-28 DIAGNOSIS — K8000 Calculus of gallbladder with acute cholecystitis without obstruction: Secondary | ICD-10-CM | POA: Diagnosis not present

## 2019-01-28 DIAGNOSIS — I2510 Atherosclerotic heart disease of native coronary artery without angina pectoris: Secondary | ICD-10-CM | POA: Diagnosis not present

## 2019-01-28 DIAGNOSIS — Z4803 Encounter for change or removal of drains: Secondary | ICD-10-CM | POA: Diagnosis not present

## 2019-01-28 DIAGNOSIS — I714 Abdominal aortic aneurysm, without rupture: Secondary | ICD-10-CM | POA: Diagnosis not present

## 2019-01-28 DIAGNOSIS — Z7409 Other reduced mobility: Secondary | ICD-10-CM | POA: Diagnosis not present

## 2019-01-28 DIAGNOSIS — D649 Anemia, unspecified: Secondary | ICD-10-CM | POA: Diagnosis not present

## 2019-01-28 DIAGNOSIS — I110 Hypertensive heart disease with heart failure: Secondary | ICD-10-CM | POA: Diagnosis not present

## 2019-01-28 DIAGNOSIS — N179 Acute kidney failure, unspecified: Secondary | ICD-10-CM | POA: Diagnosis not present

## 2019-01-28 DIAGNOSIS — I5030 Unspecified diastolic (congestive) heart failure: Secondary | ICD-10-CM | POA: Diagnosis not present

## 2019-01-28 DIAGNOSIS — E119 Type 2 diabetes mellitus without complications: Secondary | ICD-10-CM | POA: Diagnosis not present

## 2019-01-29 ENCOUNTER — Other Ambulatory Visit: Payer: Self-pay

## 2019-01-29 ENCOUNTER — Encounter (HOSPITAL_COMMUNITY): Payer: Self-pay | Admitting: Emergency Medicine

## 2019-01-29 ENCOUNTER — Telehealth: Payer: Self-pay | Admitting: Family Medicine

## 2019-01-29 ENCOUNTER — Emergency Department (HOSPITAL_COMMUNITY)
Admission: EM | Admit: 2019-01-29 | Discharge: 2019-01-29 | Disposition: A | Discharge status: Home / Self Care | Payer: Medicare HMO | Attending: Emergency Medicine | Admitting: Emergency Medicine

## 2019-01-29 DIAGNOSIS — D649 Anemia, unspecified: Secondary | ICD-10-CM | POA: Insufficient documentation

## 2019-01-29 DIAGNOSIS — I259 Chronic ischemic heart disease, unspecified: Secondary | ICD-10-CM | POA: Diagnosis not present

## 2019-01-29 DIAGNOSIS — Z87891 Personal history of nicotine dependence: Secondary | ICD-10-CM | POA: Insufficient documentation

## 2019-01-29 DIAGNOSIS — E119 Type 2 diabetes mellitus without complications: Secondary | ICD-10-CM | POA: Insufficient documentation

## 2019-01-29 DIAGNOSIS — Z79899 Other long term (current) drug therapy: Secondary | ICD-10-CM | POA: Diagnosis not present

## 2019-01-29 DIAGNOSIS — J449 Chronic obstructive pulmonary disease, unspecified: Secondary | ICD-10-CM | POA: Insufficient documentation

## 2019-01-29 DIAGNOSIS — Z7902 Long term (current) use of antithrombotics/antiplatelets: Secondary | ICD-10-CM | POA: Insufficient documentation

## 2019-01-29 DIAGNOSIS — Z7984 Long term (current) use of oral hypoglycemic drugs: Secondary | ICD-10-CM | POA: Insufficient documentation

## 2019-01-29 DIAGNOSIS — Z7982 Long term (current) use of aspirin: Secondary | ICD-10-CM | POA: Insufficient documentation

## 2019-01-29 DIAGNOSIS — I10 Essential (primary) hypertension: Secondary | ICD-10-CM | POA: Diagnosis not present

## 2019-01-29 LAB — COMPREHENSIVE METABOLIC PANEL
ALT: 74 U/L — ABNORMAL HIGH (ref 0–44)
AST: 24 U/L (ref 15–41)
Albumin: 2.6 g/dL — ABNORMAL LOW (ref 3.5–5.0)
Alkaline Phosphatase: 82 U/L (ref 38–126)
Anion gap: 6 (ref 5–15)
BUN: 10 mg/dL (ref 8–23)
CO2: 28 mmol/L (ref 22–32)
Calcium: 8 mg/dL — ABNORMAL LOW (ref 8.9–10.3)
Chloride: 100 mmol/L (ref 98–111)
Creatinine, Ser: 1.46 mg/dL — ABNORMAL HIGH (ref 0.61–1.24)
GFR calc Af Amer: 53 mL/min — ABNORMAL LOW (ref >60)
GFR calc non Af Amer: 45 mL/min — ABNORMAL LOW (ref >60)
Glucose, Bld: 157 mg/dL — ABNORMAL HIGH (ref 70–99)
Potassium: 4.3 mmol/L (ref 3.5–5.1)
Sodium: 134 mmol/L — ABNORMAL LOW (ref 135–145)
Total Bilirubin: 0.5 mg/dL (ref 0.3–1.2)
Total Protein: 6.4 g/dL — ABNORMAL LOW (ref 6.5–8.1)

## 2019-01-29 LAB — CBC WITH DIFFERENTIAL/PLATELET
Abs Immature Granulocytes: 0.2 10*3/uL — ABNORMAL HIGH (ref 0.00–0.07)
Basophils Absolute: 0 10*3/uL (ref 0.0–0.1)
Basophils Relative: 0 %
Eosinophils Absolute: 0.1 10*3/uL (ref 0.0–0.5)
Eosinophils Relative: 1 %
HCT: 23.5 % — ABNORMAL LOW (ref 39.0–52.0)
Hemoglobin: 7 g/dL — ABNORMAL LOW (ref 13.0–17.0)
Immature Granulocytes: 5 %
Lymphocytes Relative: 20 %
Lymphs Abs: 0.9 10*3/uL (ref 0.7–4.0)
MCH: 30 pg (ref 26.0–34.0)
MCHC: 29.8 g/dL — ABNORMAL LOW (ref 30.0–36.0)
MCV: 100.9 fL — ABNORMAL HIGH (ref 80.0–100.0)
Monocytes Absolute: 0.3 10*3/uL (ref 0.1–1.0)
Monocytes Relative: 7 %
Neutro Abs: 3 10*3/uL (ref 1.7–7.7)
Neutrophils Relative %: 67 %
Platelets: 132 10*3/uL — ABNORMAL LOW (ref 150–400)
RBC: 2.33 MIL/uL — ABNORMAL LOW (ref 4.22–5.81)
RDW: 19 % — ABNORMAL HIGH (ref 11.5–15.5)
WBC: 4.4 10*3/uL (ref 4.0–10.5)
nRBC: 0 % (ref 0.0–0.2)

## 2019-01-29 LAB — LIPASE, BLOOD: Lipase: 39 U/L (ref 11–51)

## 2019-01-29 LAB — TYPE AND SCREEN
ABO/RH(D): O POS
Antibody Screen: NEGATIVE

## 2019-01-29 LAB — POC OCCULT BLOOD, ED: Fecal Occult Bld: NEGATIVE

## 2019-01-29 LAB — ABO/RH: ABO/RH(D): O POS

## 2019-01-29 NOTE — Discharge Instructions (Signed)
Hemoglobin here tonight was 7.0.  Vital signs with any significant abnormality.  Cholecystostomy tube without any sniffing abnormalities no evidence of blood loss.  Rectal exam without melena or gross blood.  May end up requiring blood transfusion but have not met the threshold for transfusion here tonight.  Hemoglobin on October 7 was 7.3.  Close follow-up of the blood counts will be important.  Return for any new or worse symptoms.

## 2019-01-29 NOTE — ED Provider Notes (Signed)
[facility] EMERGENCY DEPARTMENT Provider Note   CSN: [id number] Arrival date & time: 01/29/19  1802     History   Chief Complaint Chief Complaint  Patient presents with  . Abnormal Lab    HPI LILIANA BRENTLINGER is a 78 y.o. male.     Patient sent in by primary care for a hemoglobin that they got yesterday of 6.8.  Chart review shows that patient on October 7 had a hemoglobin of 7.3.  Patient underwent a cholecystostomy tube for acute cholecystitis by interventional radiology at [facility] on October 6.  Patient denies any symptoms.  Denies any black stool or blood in his bowel movements or any blood coming from his gallbladder tube.  No abdominal pain.  Does not feel like he is going to pass out.  Is not lightheaded.  No fevers no upper respiratory symptoms.  Patient is on Plavix.     Past Medical History:  Diagnosis Date  . 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 Aortic stenosis   . CHF (congestive heart failure) ([place])   . Complication of anesthesia    hard to be put to sleep  . COPD (chronic obstructive pulmonary disease) ([place])   . Coronary artery disease   . Diabetes mellitus without complication ([place])    takes Januvia and Metformin daily  . GERD (gastroesophageal reflux disease)    takes omeprazole daily  . Headache(784.0)    sinus  . History of bronchitis    last time >61yr ago  . Hyperlipidemia    takes Lipitor daily  . Hypertension    takes Metoprolol daily  . Joint pain   . Myocardial infarction ([place]    x 3;last one about 3-427yrago  . Pneumonia    last itme about 9y80yrgo    Patient Active Problem List   Diagnosis Date Noted  . Anxiety 10/28/2018  . Chronic congestive heart failure (HCCRural Hall7/15/2020  . At risk for falls 10/28/2018  . 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 Major depressive disorder, single episode, severe without psychotic features (HCC[place]ed[place]4/11/2017  . Thrombocytopenia (HCCWanam[place]4/11/2017  . Chronic obstructive  pulmonary disease (HCCRedding9/13/2017  . Coronary artery disease 12/27/2015  . Gastroesophageal reflux disease 12/27/2015  . Essential hypertension 12/27/2015  . Hyperlipidemia 09/09/2013  . Diabetes mellitus without complication (HCCClintwood5/27/09/2374 Past Surgical History:  Procedure Laterality Date  . abdominal aneurysm stenting    . CORONARY ANGIOPLASTY  2010  . cyst removed from left wrist    . HERNIA REPAIR    . left shoulder surgery    . SHOULDER ARTHROSCOPY WITH ROTATOR CUFF REPAIR AND SUBACROMIAL DECOMPRESSION  04/17/2012   Procedure: SHOULDER ARTHROSCOPY WITH ROTATOR CUFF REPAIR AND SUBACROMIAL DECOMPRESSION;  Surgeon: W DYvette RackMD;  [facility]  Laterality: Right;  RIGHT SHOULDER ROTATOR CUFF REPAIR INCLUDING ACROMIOPLASTY CHRONIC, ARTHROSCOPY SHOULDER DEBRIDEMENT EXTENSIVE  . TONSILLECTOMY          Home Medications    Prior to Admission medications   Medication Sig Start Date End Date Taking? Authorizing Provider  albuterol (PROVENTIL) (2.5 MG/3ML) 0.083% nebulizer solution Take 2.5 mg by nebulization every 6 (six) hours as needed for wheezing or shortness of breath.    [provider]  albuterol (VENTOLIN HFA) 108 (90 Base) MCG/ACT inhaler Inhale 2 puffs into the lungs every 6 (six) hours as needed for wheezing or shortness of breath. 10/28/18   JoyLoman Brooklyn  FNP  aspirin 81 MG chewable tablet Chew 81 mg by mouth daily.    [provider]  Blood Glucose Monitoring Suppl (ONETOUCH VERIO) w/Device KIT TID 05/25/18   [provider]  budesonide-formoterol (SYMBICORT) 80-4.5 MCG/ACT inhaler Inhale 2 puffs into the lungs 2 (two) times daily. 10/28/18   Loman Brooklyn, FNP  Calcium Carbonate-Simethicone 750-80 MG CHEW Chew 1 tablet by mouth as needed.    [provider]  clopidogrel (PLAVIX) 75 MG tablet Take 1 tablet (75 mg total) by mouth daily. 10/28/18   Loman Brooklyn, FNP  glipiZIDE (GLUCOTROL XL) 10 MG  24 hr tablet  12/25/18   [provider]  glucose blood (ONETOUCH VERIO) test strip TID Dx E11.9 08/26/18   [provider]  metFORMIN (GLUCOPHAGE XR) 500 MG 24 hr tablet Take 2 tablets (1,000 mg total) by mouth 2 (two) times daily. 11/12/18   Dettinger, Fransisca Kaufmann, MD  metoprolol tartrate (LOPRESSOR) 50 MG tablet Take 1 tablet (50 mg total) by mouth 2 (two) times daily. 10/28/18   Loman Brooklyn, FNP  Multiple Vitamin (MULTIVITAMIN WITH MINERALS) TABS Take 1 tablet by mouth daily.    [provider]  nitroGLYCERIN (NITROSTAT) 0.4 MG SL tablet Place 0.4 mg under the tongue every 5 (five) minutes as needed. For chest pain    [provider]  ofloxacin (OCUFLOX) 0.3 % ophthalmic solution every morning. 03/04/16   [provider]  omeprazole (PRILOSEC) 40 MG capsule Take 1 capsule (40 mg total) by mouth daily. 10/28/18   Loman Brooklyn, FNP  ondansetron (ZOFRAN ODT) 4 MG disintegrating tablet Take 1 tablet (4 mg total) by mouth every 8 (eight) hours as needed for nausea or vomiting. 12/22/18   [name], DO  rosuvastatin (CRESTOR) 20 MG tablet Take 1 tablet (20 mg total) by mouth daily. 10/28/18   Loman Brooklyn, FNP  sertraline (ZOLOFT) 25 MG tablet Take 1 tablet (25 mg total) by mouth daily. 10/28/18   Loman Brooklyn, FNP  sitaGLIPtin (JANUVIA) 50 MG tablet Take 1 tablet (50 mg total) by mouth 2 (two) times a day. 10/28/18   Loman Brooklyn, FNP  torsemide (DEMADEX) 20 MG tablet Take 2 tablets (40 mg total) by mouth 2 (two) times daily. 10/28/18   Loman Brooklyn, FNP    Family History Family History  Problem Relation Age of Onset  . Cancer Mother        Unknown type  . Heart disease Father   . Diabetes Father   . Heart disease Sister   . Seizures Brother   . Diabetes Daughter   . Diabetes Daughter   . Heart attack Son     Social History Social History   Tobacco Use  . Smoking status: Former Research scientist (life sciences)  . Smokeless tobacco: Never Used   . Tobacco comment: quit smoking 20+yrs ago  Substance Use Topics  . Alcohol use: No  . Drug use: No     Allergies   Codeine, Latex, and Lisinopril   Review of Systems Review of Systems  Constitutional: Positive for fatigue. Negative for chills and fever.  HENT: Negative for congestion, rhinorrhea and sore throat.   Eyes: Negative for visual disturbance.  Respiratory: Negative for cough and shortness of breath.   Cardiovascular: Negative for chest pain and leg swelling.  Gastrointestinal: Negative for abdominal distention, abdominal pain, blood in stool, diarrhea, nausea and vomiting.  Genitourinary: Negative for dysuria.  Musculoskeletal: Negative for back pain and neck  pain.  Skin: Negative for rash.  Neurological: Negative for dizziness, syncope, light-headedness and headaches.  Hematological: Does not bruise/bleed easily.  Psychiatric/Behavioral: Negative for confusion.     Physical Exam Updated Vital Signs BP (!) 109/47   Pulse (!) 54   Temp 98.1 F (36.7 C) (Oral)   Resp 19   Ht 1.727 m ('5\' 8"'$ )   Wt 93 kg   SpO2 98%   BMI 31.17 kg/m   Physical Exam Vitals signs and nursing note reviewed.  Constitutional:      General: He is not in acute distress.    Appearance: Normal appearance. He is well-developed.  HENT:     Head: Normocephalic and atraumatic.  Eyes:     Extraocular Movements: Extraocular movements intact.     Conjunctiva/sclera: Conjunctivae normal.     Pupils: Pupils are equal, round, and reactive to light.  Neck:     Musculoskeletal: Neck supple.  Cardiovascular:     Rate and Rhythm: Normal rate and regular rhythm.     Heart sounds: No murmur.  Pulmonary:     Effort: Pulmonary effort is normal. No respiratory distress.     Breath sounds: Normal breath sounds.  Abdominal:     Palpations: Abdomen is soft.     Tenderness: There is no abdominal tenderness.     Comments: Cholecystostomy tube right upper quadrant skin around the opening is without  evidence of infection.  Normal bile coming out of the tube.  No significant tenderness.  No blood in the bag.  Genitourinary:    Rectum: Guaiac result negative.     Comments: Rectal exam without melena or gross blood.  Stool was brown in color. Musculoskeletal: Normal range of motion.  Skin:    General: Skin is warm and dry.  Neurological:     General: No focal deficit present.     Mental Status: He is alert and oriented to person, place, and time.      ED Treatments / Results  Labs (all labs ordered are listed, but only abnormal results are displayed) Labs Reviewed  CBC WITH DIFFERENTIAL/PLATELET - Abnormal; Notable for the following components:      Result Value   RBC 2.33 (*)    Hemoglobin 7.0 (*)    HCT 23.5 (*)    MCV 100.9 (*)    MCHC 29.8 (*)    RDW 19.0 (*)    Platelets 132 (*)    Abs Immature Granulocytes 0.20 (*)    All other components within normal limits  COMPREHENSIVE METABOLIC PANEL - Abnormal; Notable for the following components:   Sodium 134 (*)    Glucose, Bld 157 (*)    Creatinine, Ser 1.46 (*)    Calcium 8.0 (*)    Total Protein 6.4 (*)    Albumin 2.6 (*)    ALT 74 (*)    GFR calc non Af Amer 45 (*)    GFR calc Af Amer 53 (*)    All other components within normal limits  LIPASE, BLOOD  POC OCCULT BLOOD, ED  TYPE AND SCREEN  ABO/RH    EKG None  Radiology No results found.  Procedures Procedures (including critical care time)  Medications Ordered in ED Medications - No data to display   Initial Impression / Assessment and Plan / ED Course  I have reviewed the triage vital signs and the nursing notes.  Pertinent labs & imaging results that were available during my care of the patient were reviewed by me and  considered in my medical decision making (see chart for details).        Patient clinically stable no significant symptoms.  Not tachycardic not hypotensive.  Not lightheaded.  Rectal exam is Hemoccult negative.  Patient's  cholecystostomy tube appears to be doing well draining well no evidence of blood there.  No abdominal symptoms or tenderness.  Patient's hemoglobin on October 7 was 7.3.  Tonight it was 7.0.  Close to threshold for transfusion but has not met it.  Recommend close follow-up by his provider next week.  If he drops below 7 then may require blood transfusion patient nontoxic no acute distress.  Final Clinical Impressions(s) / ED Diagnoses   Final diagnoses:  Anemia, unspecified type    ED Discharge Orders    None       Fredia Sorrow, MD 01/29/19 2239

## 2019-01-29 NOTE — ED Notes (Signed)
Pt from chair back to bed- standby assist Spouse called to come back as pt will be discharged and needs transport home

## 2019-01-29 NOTE — ED Notes (Signed)
Lab aware of need for blood draw

## 2019-01-29 NOTE — ED Notes (Signed)
Pt spouse to nurse's station saying she was going to leave until results have come back and disposition is made- spouse would like call whether pt is discharged or admitted. Will pick up pt if discharged.

## 2019-01-29 NOTE — ED Notes (Signed)
Drainage bag collecting fluid from gallbladder has small amount of green liquid present- no visible blood noted. Drainage system intact.

## 2019-01-29 NOTE — ED Notes (Signed)
Placed on O2@2l /m

## 2019-01-29 NOTE — ED Notes (Signed)
Pt transferred from stretcher to chair with standby assist.

## 2019-01-29 NOTE — ED Triage Notes (Signed)
PT sent over from PCP office today due to symptomatic anemia and Hgb of 6.8. PT states has been very tired and sleeping a lot. PT states he had a tube placed in his gallbladder and has been loosing blood from there.

## 2019-01-29 NOTE — Telephone Encounter (Signed)
Patient called regarding [place] labs obtained to follow up lab. Hemoglobin noted to be critically low at 6.8, platelets 130.  Patient weak, fatigued.  Denies shortness of breath, loss of consciousness or chest pain  I spoke to the [facility] emergency department triage nurse.  She is aware of patient's arrival via private vehicle and we discussed his labs.  Will likely need transfusion given symptomatic anemia with hemoglobin of less than 7.

## 2019-02-01 DIAGNOSIS — I5030 Unspecified diastolic (congestive) heart failure: Secondary | ICD-10-CM | POA: Diagnosis not present

## 2019-02-01 DIAGNOSIS — Z4803 Encounter for change or removal of drains: Secondary | ICD-10-CM | POA: Diagnosis not present

## 2019-02-01 DIAGNOSIS — I110 Hypertensive heart disease with heart failure: Secondary | ICD-10-CM | POA: Diagnosis not present

## 2019-02-01 DIAGNOSIS — E119 Type 2 diabetes mellitus without complications: Secondary | ICD-10-CM | POA: Diagnosis not present

## 2019-02-01 DIAGNOSIS — I714 Abdominal aortic aneurysm, without rupture: Secondary | ICD-10-CM | POA: Diagnosis not present

## 2019-02-01 DIAGNOSIS — D649 Anemia, unspecified: Secondary | ICD-10-CM | POA: Diagnosis not present

## 2019-02-01 DIAGNOSIS — K8000 Calculus of gallbladder with acute cholecystitis without obstruction: Secondary | ICD-10-CM | POA: Diagnosis not present

## 2019-02-01 DIAGNOSIS — I2510 Atherosclerotic heart disease of native coronary artery without angina pectoris: Secondary | ICD-10-CM | POA: Diagnosis not present

## 2019-02-01 DIAGNOSIS — N179 Acute kidney failure, unspecified: Secondary | ICD-10-CM | POA: Diagnosis not present

## 2019-02-01 DIAGNOSIS — J449 Chronic obstructive pulmonary disease, unspecified: Secondary | ICD-10-CM | POA: Diagnosis not present

## 2019-02-02 DIAGNOSIS — Z961 Presence of intraocular lens: Secondary | ICD-10-CM | POA: Diagnosis not present

## 2019-02-02 DIAGNOSIS — I714 Abdominal aortic aneurysm, without rupture: Secondary | ICD-10-CM | POA: Diagnosis not present

## 2019-02-02 DIAGNOSIS — Z4803 Encounter for change or removal of drains: Secondary | ICD-10-CM | POA: Diagnosis not present

## 2019-02-02 DIAGNOSIS — N179 Acute kidney failure, unspecified: Secondary | ICD-10-CM | POA: Diagnosis not present

## 2019-02-02 DIAGNOSIS — I110 Hypertensive heart disease with heart failure: Secondary | ICD-10-CM | POA: Diagnosis not present

## 2019-02-02 DIAGNOSIS — I5030 Unspecified diastolic (congestive) heart failure: Secondary | ICD-10-CM | POA: Diagnosis not present

## 2019-02-02 DIAGNOSIS — D649 Anemia, unspecified: Secondary | ICD-10-CM | POA: Diagnosis not present

## 2019-02-02 DIAGNOSIS — H2511 Age-related nuclear cataract, right eye: Secondary | ICD-10-CM | POA: Diagnosis not present

## 2019-02-02 DIAGNOSIS — K8000 Calculus of gallbladder with acute cholecystitis without obstruction: Secondary | ICD-10-CM | POA: Diagnosis not present

## 2019-02-02 DIAGNOSIS — I2510 Atherosclerotic heart disease of native coronary artery without angina pectoris: Secondary | ICD-10-CM | POA: Diagnosis not present

## 2019-02-02 DIAGNOSIS — E119 Type 2 diabetes mellitus without complications: Secondary | ICD-10-CM | POA: Diagnosis not present

## 2019-02-02 DIAGNOSIS — J449 Chronic obstructive pulmonary disease, unspecified: Secondary | ICD-10-CM | POA: Diagnosis not present

## 2019-02-02 LAB — HM DIABETES EYE EXAM

## 2019-02-04 DIAGNOSIS — E8770 Fluid overload, unspecified: Secondary | ICD-10-CM | POA: Diagnosis not present

## 2019-02-04 DIAGNOSIS — Z952 Presence of prosthetic heart valve: Secondary | ICD-10-CM | POA: Diagnosis not present

## 2019-02-04 DIAGNOSIS — K8000 Calculus of gallbladder with acute cholecystitis without obstruction: Secondary | ICD-10-CM | POA: Diagnosis not present

## 2019-02-04 DIAGNOSIS — Z87891 Personal history of nicotine dependence: Secondary | ICD-10-CM | POA: Diagnosis not present

## 2019-02-04 DIAGNOSIS — Z4803 Encounter for change or removal of drains: Secondary | ICD-10-CM | POA: Diagnosis not present

## 2019-02-04 DIAGNOSIS — I110 Hypertensive heart disease with heart failure: Secondary | ICD-10-CM | POA: Diagnosis not present

## 2019-02-04 DIAGNOSIS — N179 Acute kidney failure, unspecified: Secondary | ICD-10-CM | POA: Diagnosis not present

## 2019-02-04 DIAGNOSIS — Z09 Encounter for follow-up examination after completed treatment for conditions other than malignant neoplasm: Secondary | ICD-10-CM | POA: Diagnosis not present

## 2019-02-04 DIAGNOSIS — J449 Chronic obstructive pulmonary disease, unspecified: Secondary | ICD-10-CM | POA: Diagnosis not present

## 2019-02-04 DIAGNOSIS — I714 Abdominal aortic aneurysm, without rupture: Secondary | ICD-10-CM | POA: Diagnosis not present

## 2019-02-04 DIAGNOSIS — I5030 Unspecified diastolic (congestive) heart failure: Secondary | ICD-10-CM | POA: Diagnosis not present

## 2019-02-04 DIAGNOSIS — Z87448 Personal history of other diseases of urinary system: Secondary | ICD-10-CM | POA: Diagnosis not present

## 2019-02-04 DIAGNOSIS — E119 Type 2 diabetes mellitus without complications: Secondary | ICD-10-CM | POA: Diagnosis not present

## 2019-02-04 DIAGNOSIS — I350 Nonrheumatic aortic (valve) stenosis: Secondary | ICD-10-CM | POA: Diagnosis not present

## 2019-02-04 DIAGNOSIS — I2510 Atherosclerotic heart disease of native coronary artery without angina pectoris: Secondary | ICD-10-CM | POA: Diagnosis not present

## 2019-02-04 DIAGNOSIS — Z8679 Personal history of other diseases of the circulatory system: Secondary | ICD-10-CM | POA: Diagnosis not present

## 2019-02-04 DIAGNOSIS — D649 Anemia, unspecified: Secondary | ICD-10-CM | POA: Diagnosis not present

## 2019-02-04 DIAGNOSIS — I10 Essential (primary) hypertension: Secondary | ICD-10-CM | POA: Diagnosis not present

## 2019-02-04 DIAGNOSIS — N329 Bladder disorder, unspecified: Secondary | ICD-10-CM | POA: Diagnosis not present

## 2019-02-05 DIAGNOSIS — I5030 Unspecified diastolic (congestive) heart failure: Secondary | ICD-10-CM | POA: Diagnosis not present

## 2019-02-05 DIAGNOSIS — Z4803 Encounter for change or removal of drains: Secondary | ICD-10-CM | POA: Diagnosis not present

## 2019-02-05 DIAGNOSIS — I714 Abdominal aortic aneurysm, without rupture: Secondary | ICD-10-CM | POA: Diagnosis not present

## 2019-02-05 DIAGNOSIS — I2510 Atherosclerotic heart disease of native coronary artery without angina pectoris: Secondary | ICD-10-CM | POA: Diagnosis not present

## 2019-02-05 DIAGNOSIS — E119 Type 2 diabetes mellitus without complications: Secondary | ICD-10-CM | POA: Diagnosis not present

## 2019-02-05 DIAGNOSIS — D649 Anemia, unspecified: Secondary | ICD-10-CM | POA: Diagnosis not present

## 2019-02-05 DIAGNOSIS — I110 Hypertensive heart disease with heart failure: Secondary | ICD-10-CM | POA: Diagnosis not present

## 2019-02-05 DIAGNOSIS — K8000 Calculus of gallbladder with acute cholecystitis without obstruction: Secondary | ICD-10-CM | POA: Diagnosis not present

## 2019-02-05 DIAGNOSIS — N179 Acute kidney failure, unspecified: Secondary | ICD-10-CM | POA: Diagnosis not present

## 2019-02-05 DIAGNOSIS — J449 Chronic obstructive pulmonary disease, unspecified: Secondary | ICD-10-CM | POA: Diagnosis not present

## 2019-02-08 ENCOUNTER — Other Ambulatory Visit: Payer: Self-pay | Admitting: Family Medicine

## 2019-02-08 DIAGNOSIS — K219 Gastro-esophageal reflux disease without esophagitis: Secondary | ICD-10-CM

## 2019-02-09 DIAGNOSIS — R69 Illness, unspecified: Secondary | ICD-10-CM | POA: Diagnosis not present

## 2019-02-16 DIAGNOSIS — I2510 Atherosclerotic heart disease of native coronary artery without angina pectoris: Secondary | ICD-10-CM | POA: Diagnosis not present

## 2019-02-16 DIAGNOSIS — I959 Hypotension, unspecified: Secondary | ICD-10-CM | POA: Diagnosis not present

## 2019-02-16 DIAGNOSIS — K819 Cholecystitis, unspecified: Secondary | ICD-10-CM | POA: Diagnosis present

## 2019-02-16 DIAGNOSIS — D649 Anemia, unspecified: Secondary | ICD-10-CM | POA: Diagnosis not present

## 2019-02-16 DIAGNOSIS — I714 Abdominal aortic aneurysm, without rupture: Secondary | ICD-10-CM | POA: Diagnosis not present

## 2019-02-16 DIAGNOSIS — Z952 Presence of prosthetic heart valve: Secondary | ICD-10-CM | POA: Diagnosis not present

## 2019-02-16 DIAGNOSIS — J449 Chronic obstructive pulmonary disease, unspecified: Secondary | ICD-10-CM | POA: Diagnosis not present

## 2019-02-16 DIAGNOSIS — I509 Heart failure, unspecified: Secondary | ICD-10-CM | POA: Diagnosis not present

## 2019-02-16 DIAGNOSIS — E119 Type 2 diabetes mellitus without complications: Secondary | ICD-10-CM | POA: Diagnosis not present

## 2019-02-17 ENCOUNTER — Telehealth: Payer: Self-pay | Admitting: Family Medicine

## 2019-02-17 NOTE — Telephone Encounter (Signed)
If his heart was out of rhythm and this would indicate new onset arrhythmia.  Given recent severe illness, borderline hemoglobin and history of cardiac disease he may need to seek immediate medical attention the emergency department if the cardiologist is unable to see him promptly.  The pulse is likely not a true pulse if he is indeed in an abnormal heart rhythm.  However, may need to skip today's blood pressure medications if blood pressure is low.  Please make sure that he follows up with cardiology ASAP.

## 2019-02-17 NOTE — Telephone Encounter (Signed)
Patients wife states he went to his general surgeon  follow up yesterday his BP was 103/43 and pulse was 37. They done EKG and it showed his heart was out of rhythm. His hgb was 7.3. Oxygen was 89 this morning. BP was 129/58 this morning. They did not get a pulse this morning.He does have a cardiologist and letting them know as well.

## 2019-02-18 DIAGNOSIS — Z952 Presence of prosthetic heart valve: Secondary | ICD-10-CM | POA: Insufficient documentation

## 2019-02-18 DIAGNOSIS — R001 Bradycardia, unspecified: Secondary | ICD-10-CM | POA: Insufficient documentation

## 2019-02-19 DIAGNOSIS — I259 Chronic ischemic heart disease, unspecified: Secondary | ICD-10-CM | POA: Diagnosis not present

## 2019-02-19 DIAGNOSIS — I2510 Atherosclerotic heart disease of native coronary artery without angina pectoris: Secondary | ICD-10-CM | POA: Diagnosis not present

## 2019-02-19 DIAGNOSIS — Z7982 Long term (current) use of aspirin: Secondary | ICD-10-CM | POA: Diagnosis not present

## 2019-02-19 DIAGNOSIS — Z952 Presence of prosthetic heart valve: Secondary | ICD-10-CM | POA: Diagnosis not present

## 2019-02-19 DIAGNOSIS — Z79899 Other long term (current) drug therapy: Secondary | ICD-10-CM | POA: Diagnosis not present

## 2019-02-19 DIAGNOSIS — I509 Heart failure, unspecified: Secondary | ICD-10-CM | POA: Diagnosis not present

## 2019-02-19 DIAGNOSIS — I440 Atrioventricular block, first degree: Secondary | ICD-10-CM | POA: Diagnosis not present

## 2019-02-19 DIAGNOSIS — R001 Bradycardia, unspecified: Secondary | ICD-10-CM | POA: Diagnosis not present

## 2019-02-23 DIAGNOSIS — Z4682 Encounter for fitting and adjustment of non-vascular catheter: Secondary | ICD-10-CM | POA: Diagnosis not present

## 2019-02-23 DIAGNOSIS — K810 Acute cholecystitis: Secondary | ICD-10-CM | POA: Diagnosis not present

## 2019-02-23 DIAGNOSIS — Z438 Encounter for attention to other artificial openings: Secondary | ICD-10-CM | POA: Diagnosis not present

## 2019-02-24 ENCOUNTER — Telehealth: Payer: Self-pay | Admitting: Neurology

## 2019-02-24 ENCOUNTER — Institutional Professional Consult (permissible substitution): Payer: Medicare HMO | Admitting: Neurology

## 2019-02-24 NOTE — Telephone Encounter (Signed)
Pt was a no show for the sleep consult today!

## 2019-02-25 ENCOUNTER — Encounter: Payer: Self-pay | Admitting: Neurology

## 2019-02-25 DIAGNOSIS — I350 Nonrheumatic aortic (valve) stenosis: Secondary | ICD-10-CM | POA: Diagnosis not present

## 2019-02-25 DIAGNOSIS — R0600 Dyspnea, unspecified: Secondary | ICD-10-CM | POA: Diagnosis not present

## 2019-02-25 DIAGNOSIS — Z7982 Long term (current) use of aspirin: Secondary | ICD-10-CM | POA: Diagnosis not present

## 2019-02-25 DIAGNOSIS — Z952 Presence of prosthetic heart valve: Secondary | ICD-10-CM | POA: Diagnosis not present

## 2019-02-25 DIAGNOSIS — E8770 Fluid overload, unspecified: Secondary | ICD-10-CM | POA: Diagnosis not present

## 2019-02-25 DIAGNOSIS — I2510 Atherosclerotic heart disease of native coronary artery without angina pectoris: Secondary | ICD-10-CM | POA: Diagnosis not present

## 2019-02-25 DIAGNOSIS — I259 Chronic ischemic heart disease, unspecified: Secondary | ICD-10-CM | POA: Diagnosis not present

## 2019-02-25 DIAGNOSIS — I4891 Unspecified atrial fibrillation: Secondary | ICD-10-CM | POA: Diagnosis not present

## 2019-02-25 DIAGNOSIS — D61818 Other pancytopenia: Secondary | ICD-10-CM | POA: Diagnosis present

## 2019-02-25 DIAGNOSIS — I480 Paroxysmal atrial fibrillation: Secondary | ICD-10-CM | POA: Diagnosis not present

## 2019-02-25 DIAGNOSIS — R001 Bradycardia, unspecified: Secondary | ICD-10-CM | POA: Diagnosis not present

## 2019-02-25 DIAGNOSIS — R9431 Abnormal electrocardiogram [ECG] [EKG]: Secondary | ICD-10-CM | POA: Diagnosis not present

## 2019-02-28 DIAGNOSIS — R531 Weakness: Secondary | ICD-10-CM | POA: Diagnosis not present

## 2019-02-28 DIAGNOSIS — Z7409 Other reduced mobility: Secondary | ICD-10-CM | POA: Diagnosis not present

## 2019-03-02 ENCOUNTER — Other Ambulatory Visit: Payer: Self-pay

## 2019-03-02 ENCOUNTER — Ambulatory Visit (INDEPENDENT_AMBULATORY_CARE_PROVIDER_SITE_OTHER): Payer: Medicare HMO

## 2019-03-02 DIAGNOSIS — Z9181 History of falling: Secondary | ICD-10-CM

## 2019-03-02 DIAGNOSIS — Z952 Presence of prosthetic heart valve: Secondary | ICD-10-CM

## 2019-03-02 DIAGNOSIS — F419 Anxiety disorder, unspecified: Secondary | ICD-10-CM

## 2019-03-02 DIAGNOSIS — D649 Anemia, unspecified: Secondary | ICD-10-CM

## 2019-03-02 DIAGNOSIS — I714 Abdominal aortic aneurysm, without rupture: Secondary | ICD-10-CM

## 2019-03-02 DIAGNOSIS — K8000 Calculus of gallbladder with acute cholecystitis without obstruction: Secondary | ICD-10-CM

## 2019-03-02 DIAGNOSIS — E789 Disorder of lipoprotein metabolism, unspecified: Secondary | ICD-10-CM

## 2019-03-02 DIAGNOSIS — N179 Acute kidney failure, unspecified: Secondary | ICD-10-CM

## 2019-03-02 DIAGNOSIS — J449 Chronic obstructive pulmonary disease, unspecified: Secondary | ICD-10-CM | POA: Diagnosis not present

## 2019-03-02 DIAGNOSIS — Z4803 Encounter for change or removal of drains: Secondary | ICD-10-CM | POA: Diagnosis not present

## 2019-03-02 DIAGNOSIS — I5030 Unspecified diastolic (congestive) heart failure: Secondary | ICD-10-CM | POA: Diagnosis not present

## 2019-03-02 DIAGNOSIS — I2510 Atherosclerotic heart disease of native coronary artery without angina pectoris: Secondary | ICD-10-CM | POA: Diagnosis not present

## 2019-03-02 DIAGNOSIS — I110 Hypertensive heart disease with heart failure: Secondary | ICD-10-CM | POA: Diagnosis not present

## 2019-03-02 DIAGNOSIS — Z7902 Long term (current) use of antithrombotics/antiplatelets: Secondary | ICD-10-CM

## 2019-03-02 DIAGNOSIS — Z7951 Long term (current) use of inhaled steroids: Secondary | ICD-10-CM

## 2019-03-02 DIAGNOSIS — E119 Type 2 diabetes mellitus without complications: Secondary | ICD-10-CM | POA: Diagnosis not present

## 2019-03-02 DIAGNOSIS — G4733 Obstructive sleep apnea (adult) (pediatric): Secondary | ICD-10-CM

## 2019-03-02 DIAGNOSIS — Z955 Presence of coronary angioplasty implant and graft: Secondary | ICD-10-CM

## 2019-03-02 DIAGNOSIS — I350 Nonrheumatic aortic (valve) stenosis: Secondary | ICD-10-CM

## 2019-03-02 DIAGNOSIS — F329 Major depressive disorder, single episode, unspecified: Secondary | ICD-10-CM

## 2019-03-02 DIAGNOSIS — Z7984 Long term (current) use of oral hypoglycemic drugs: Secondary | ICD-10-CM

## 2019-03-02 NOTE — Telephone Encounter (Signed)
No answer or voice mail to leave a message. Note 16 days old and will be file.  Patient has appointments scheduled with other providers.

## 2019-03-04 ENCOUNTER — Ambulatory Visit (INDEPENDENT_AMBULATORY_CARE_PROVIDER_SITE_OTHER): Payer: Medicare HMO | Admitting: Family Medicine

## 2019-03-04 ENCOUNTER — Encounter: Payer: Self-pay | Admitting: Family Medicine

## 2019-03-04 DIAGNOSIS — R635 Abnormal weight gain: Secondary | ICD-10-CM

## 2019-03-04 DIAGNOSIS — R410 Disorientation, unspecified: Secondary | ICD-10-CM

## 2019-03-04 DIAGNOSIS — R0602 Shortness of breath: Secondary | ICD-10-CM | POA: Diagnosis not present

## 2019-03-04 DIAGNOSIS — R300 Dysuria: Secondary | ICD-10-CM | POA: Diagnosis not present

## 2019-03-04 DIAGNOSIS — J449 Chronic obstructive pulmonary disease, unspecified: Secondary | ICD-10-CM | POA: Diagnosis not present

## 2019-03-04 LAB — URINALYSIS, COMPLETE
Bilirubin, UA: NEGATIVE
Glucose, UA: NEGATIVE
Ketones, UA: NEGATIVE
Leukocytes,UA: NEGATIVE
Nitrite, UA: NEGATIVE
RBC, UA: NEGATIVE
Specific Gravity, UA: 1.02 (ref 1.005–1.030)
Urobilinogen, Ur: 0.2 mg/dL (ref 0.2–1.0)
pH, UA: 5 (ref 5.0–7.5)

## 2019-03-04 LAB — MICROSCOPIC EXAMINATION: Renal Epithel, UA: NONE SEEN /hpf

## 2019-03-04 NOTE — Progress Notes (Signed)
Virtual Visit via telephone Note Due to COVID-19 pandemic this visit was conducted virtually. This visit type was conducted due to national recommendations for restrictions regarding the COVID-19 Pandemic (e.g. social distancing, sheltering in place) in an effort to limit this patient's exposure and mitigate transmission in our community. All issues noted in this document were discussed and addressed.  A physical exam was not performed with this format.   I connected with Matthew Campos's wife on 03/04/2019 at 1515 by telephone and verified that I am speaking with the correct person using two identifiers. Matthew Campos is currently located at home and family is currently with them during visit. The provider, Monia Pouch, FNP is located in their office at time of visit.  I discussed the limitations, risks, security and privacy concerns of performing an evaluation and management service by telephone and the availability of in person appointments. I also discussed with the patient that there may be a patient responsible charge related to this service. The patient expressed understanding and agreed to proceed.  Subjective:  Patient ID: Matthew Campos, male    DOB: [birth year], 78 y.o.   MRN: [id number]  Chief Complaint:  Altered Mental Status, Weight Gain, and Shortness of Breath   HPI: Matthew Campos is a 78 y.o. male presenting on 03/04/2019 for Altered Mental Status, Weight Gain, and Shortness of Breath   Wife reports pt has had at least a 5 pound weight gain overnight with increased shortness of breath and confusion. States his abdomen is very tight and swollen. She states he has "talking out of his head". States she is concerned he has another UTI. Has dropped off specimen for analysis. No fever reported.   Altered Mental Status The current episode started today. Associated symptoms include weakness.  Shortness of Breath This is a recurrent problem. The current episode started  today. The problem has been gradually worsening. Associated symptoms include leg swelling and orthopnea. Associated symptoms comments: Abdominal distension, shortness of breath.     Relevant past medical, surgical, family, and social history reviewed and updated as indicated.  Allergies and medications reviewed and updated.   Past Medical History:  Diagnosis Date  . AAA (abdominal aortic aneurysm) without rupture ([place])    repaired  . Aortic stenosis   . CHF (congestive heart failure) ([place])   . Complication of anesthesia    hard to be put to sleep  . COPD (chronic obstructive pulmonary disease) ([place])   . Coronary artery disease   . Diabetes mellitus without complication ([place])    takes Januvia and Metformin daily  . GERD (gastroesophageal reflux disease)    takes omeprazole daily  . Headache(784.0)    sinus  . History of bronchitis    last time >39yr ago  . Hyperlipidemia    takes Lipitor daily  . Hypertension    takes Metoprolol daily  . Joint pain   . Myocardial infarction ([place]    x 3;last one about 3-497yrago  . Pneumonia    last itme about 9y61yrgo    Past Surgical History:  Procedure Laterality Date  . abdominal aneurysm stenting    . CORONARY ANGIOPLASTY  2010  . cyst removed from left wrist    . HERNIA REPAIR    . left shoulder surgery    . SHOULDER ARTHROSCOPY WITH ROTATOR CUFF REPAIR AND SUBACROMIAL DECOMPRESSION  04/17/2012   Procedure: SHOULDER ARTHROSCOPY WITH ROTATOR CUFF REPAIR AND SUBACROMIAL DECOMPRESSION;  Surgeon: W DYvette Rack  MD;  Location: [facility];  Service: Orthopedics;  Laterality: Right;  RIGHT SHOULDER ROTATOR CUFF REPAIR INCLUDING ACROMIOPLASTY CHRONIC, ARTHROSCOPY SHOULDER DEBRIDEMENT EXTENSIVE  . TONSILLECTOMY      Social History   Socioeconomic History  . Marital status: Married    Spouse name: Not on file  . Number of children: Not on file  . Years of education: Not on file  . Highest education level: Not on file  Occupational  History  . Not on file  Social Needs  . Financial resource strain: Not on file  . Food insecurity    Worry: Not on file    Inability: Not on file  . Transportation needs    Medical: Not on file    Non-medical: Not on file  Tobacco Use  . Smoking status: Former Research scientist (life sciences)  . Smokeless tobacco: Never Used  . Tobacco comment: quit smoking 20+yrs ago  Substance and Sexual Activity  . Alcohol use: No  . Drug use: No  . Sexual activity: Yes  Lifestyle  . Physical activity    Days per week: Not on file    Minutes per session: Not on file  . Stress: Not on file  Relationships  . Social Herbalist on phone: Not on file    Gets together: Not on file    Attends religious service: Not on file    Active member of club or organization: Not on file    Attends meetings of clubs or organizations: Not on file    Relationship status: Not on file  . Intimate partner violence    Fear of current or ex partner: Not on file    Emotionally abused: Not on file    Physically abused: Not on file    Forced sexual activity: Not on file  Other Topics Concern  . Not on file  Social History Narrative  . Not on file    Outpatient Encounter Medications as of 03/04/2019  Medication Sig  . albuterol (PROVENTIL) (2.5 MG/3ML) 0.083% nebulizer solution Take 2.5 mg by nebulization every 6 (six) hours as needed for wheezing or shortness of breath.  [name] albuterol (VENTOLIN HFA) 108 (90 Base) MCG/ACT inhaler Inhale 2 puffs into the lungs every 6 (six) hours as needed for wheezing or shortness of breath.  [name] aspirin 81 MG chewable tablet Chew 81 mg by mouth daily.  . Blood Glucose Monitoring Suppl (ONETOUCH VERIO) w/Device KIT TID  . budesonide-formoterol (SYMBICORT) 80-4.5 MCG/ACT inhaler Inhale 2 puffs into the lungs 2 (two) times daily.  . Calcium Carbonate-Simethicone 750-80 MG CHEW Chew 1 tablet by mouth as needed.  . clopidogrel (PLAVIX) 75 MG tablet Take 1 tablet (75 mg total) by mouth daily.  [name]  glipiZIDE (GLUCOTROL XL) 10 MG 24 hr tablet   . glucose blood (ONETOUCH VERIO) test strip TID Dx E11.9  . metFORMIN (GLUCOPHAGE XR) 500 MG 24 hr tablet Take 2 tablets (1,000 mg total) by mouth 2 (two) times daily.  . metoprolol tartrate (LOPRESSOR) 50 MG tablet Take 1 tablet (50 mg total) by mouth 2 (two) times daily.  . Multiple Vitamin (MULTIVITAMIN WITH MINERALS) TABS Take 1 tablet by mouth daily.  . nitroGLYCERIN (NITROSTAT) 0.4 MG SL tablet Place 0.4 mg under the tongue every 5 (five) minutes as needed. For chest pain  . ofloxacin (OCUFLOX) 0.3 % ophthalmic solution every morning.  [name] omeprazole (PRILOSEC) 40 MG capsule TAKE ONE (1) CAPSULE EACH DAY  . ondansetron (ZOFRAN ODT) 4 MG disintegrating tablet Take  1 tablet (4 mg total) by mouth every 8 (eight) hours as needed for nausea or vomiting.  . rosuvastatin (CRESTOR) 20 MG tablet Take 1 tablet (20 mg total) by mouth daily.  . sertraline (ZOLOFT) 25 MG tablet Take 1 tablet (25 mg total) by mouth daily.  . sitaGLIPtin (JANUVIA) 50 MG tablet Take 1 tablet (50 mg total) by mouth 2 (two) times a day.  . torsemide (DEMADEX) 20 MG tablet Take 2 tablets (40 mg total) by mouth 2 (two) times daily.   No facility-administered encounter medications on file as of 03/04/2019.     Allergies  Allergen Reactions  . Codeine Nausea And Vomiting  . Latex Hives and Itching  . Lisinopril Cough    Review of Systems  Unable to perform ROS: Mental status change (reported ROS per wife)  Respiratory: Positive for shortness of breath.   Cardiovascular: Positive for orthopnea and leg swelling.  Gastrointestinal: Positive for abdominal distention.  Genitourinary: Positive for dysuria.  Neurological: Positive for weakness.  Psychiatric/Behavioral: Positive for confusion.         Observations/Objective: No vital signs or physical exam, this was a telephone or virtual health encounter.  Pt alert and oriented, answers all questions appropriately, and able  to speak in full sentences.    Assessment and Plan: Santez was seen today for altered mental status, weight gain and shortness of breath.  Diagnoses and all orders for this visit:  Dysuria Urine sent for culture. Few bacteria, negative leukocytes and nitrites. Will treat if culture warrants.  -     Urine culture -     urinalysis- dip and micro  Confusion Weight gain Shortness of breath Wife encouraged to take pt to ED for evaluation and treatment due to confusion, 5+ pound weight gain overnight, and increased shortness of breath. Wife verbalizes understanding and will take pt to ED.     Follow Up Instructions: Return if symptoms worsen or fail to improve.    I discussed the assessment and treatment plan with the patient. The patient was provided an opportunity to ask questions and all were answered. The patient agreed with the plan and demonstrated an understanding of the instructions.   The patient was advised to call back or seek an in-person evaluation if the symptoms worsen or if the condition fails to improve as anticipated.  The above assessment and management plan was discussed with the patient. The patient verbalized understanding of and has agreed to the management plan. Patient is aware to call the clinic if they develop any new symptoms or if symptoms persist or worsen. Patient is aware when to return to the clinic for a follow-up visit. Patient educated on when it is appropriate to go to the emergency department.    I provided 15 minutes of non-face-to-face time during this encounter. The call started at 1515. The call ended at 1530. The other time was used for coordination of care.    Monia Pouch, FNP-C [facility] [address] [place], [place] [address] [phone] 03/04/2019

## 2019-03-05 LAB — URINE CULTURE

## 2019-03-15 ENCOUNTER — Other Ambulatory Visit: Payer: Self-pay | Admitting: Family Medicine

## 2019-03-15 DIAGNOSIS — R112 Nausea with vomiting, unspecified: Secondary | ICD-10-CM

## 2019-03-16 DIAGNOSIS — I4891 Unspecified atrial fibrillation: Secondary | ICD-10-CM | POA: Insufficient documentation

## 2019-03-23 DIAGNOSIS — I130 Hypertensive heart and chronic kidney disease with heart failure and stage 1 through stage 4 chronic kidney disease, or unspecified chronic kidney disease: Secondary | ICD-10-CM | POA: Diagnosis not present

## 2019-03-23 DIAGNOSIS — D631 Anemia in chronic kidney disease: Secondary | ICD-10-CM | POA: Diagnosis not present

## 2019-03-23 DIAGNOSIS — N179 Acute kidney failure, unspecified: Secondary | ICD-10-CM | POA: Diagnosis not present

## 2019-03-23 DIAGNOSIS — R69 Illness, unspecified: Secondary | ICD-10-CM | POA: Diagnosis not present

## 2019-03-23 DIAGNOSIS — R809 Proteinuria, unspecified: Secondary | ICD-10-CM | POA: Diagnosis not present

## 2019-03-23 DIAGNOSIS — Z952 Presence of prosthetic heart valve: Secondary | ICD-10-CM | POA: Diagnosis not present

## 2019-03-23 DIAGNOSIS — E873 Alkalosis: Secondary | ICD-10-CM | POA: Diagnosis not present

## 2019-03-23 DIAGNOSIS — J449 Chronic obstructive pulmonary disease, unspecified: Secondary | ICD-10-CM | POA: Diagnosis not present

## 2019-03-23 DIAGNOSIS — E8770 Fluid overload, unspecified: Secondary | ICD-10-CM | POA: Diagnosis not present

## 2019-03-23 DIAGNOSIS — I509 Heart failure, unspecified: Secondary | ICD-10-CM | POA: Diagnosis not present

## 2019-03-23 DIAGNOSIS — E1122 Type 2 diabetes mellitus with diabetic chronic kidney disease: Secondary | ICD-10-CM | POA: Diagnosis not present

## 2019-03-23 DIAGNOSIS — E872 Acidosis: Secondary | ICD-10-CM | POA: Diagnosis not present

## 2019-03-23 DIAGNOSIS — I129 Hypertensive chronic kidney disease with stage 1 through stage 4 chronic kidney disease, or unspecified chronic kidney disease: Secondary | ICD-10-CM | POA: Diagnosis not present

## 2019-03-23 DIAGNOSIS — N1831 Chronic kidney disease, stage 3a: Secondary | ICD-10-CM | POA: Diagnosis not present

## 2019-03-30 DIAGNOSIS — Z952 Presence of prosthetic heart valve: Secondary | ICD-10-CM | POA: Diagnosis not present

## 2019-03-30 DIAGNOSIS — R001 Bradycardia, unspecified: Secondary | ICD-10-CM | POA: Diagnosis not present

## 2019-03-30 DIAGNOSIS — R531 Weakness: Secondary | ICD-10-CM | POA: Diagnosis not present

## 2019-03-30 DIAGNOSIS — R809 Proteinuria, unspecified: Secondary | ICD-10-CM | POA: Diagnosis not present

## 2019-03-30 DIAGNOSIS — N179 Acute kidney failure, unspecified: Secondary | ICD-10-CM | POA: Diagnosis not present

## 2019-03-30 DIAGNOSIS — D649 Anemia, unspecified: Secondary | ICD-10-CM | POA: Diagnosis not present

## 2019-03-30 DIAGNOSIS — E873 Alkalosis: Secondary | ICD-10-CM | POA: Diagnosis not present

## 2019-03-30 DIAGNOSIS — J449 Chronic obstructive pulmonary disease, unspecified: Secondary | ICD-10-CM | POA: Diagnosis not present

## 2019-03-30 DIAGNOSIS — N1831 Chronic kidney disease, stage 3a: Secondary | ICD-10-CM | POA: Diagnosis not present

## 2019-03-30 DIAGNOSIS — E1122 Type 2 diabetes mellitus with diabetic chronic kidney disease: Secondary | ICD-10-CM | POA: Diagnosis not present

## 2019-03-30 DIAGNOSIS — Z7984 Long term (current) use of oral hypoglycemic drugs: Secondary | ICD-10-CM | POA: Diagnosis not present

## 2019-03-30 DIAGNOSIS — I129 Hypertensive chronic kidney disease with stage 1 through stage 4 chronic kidney disease, or unspecified chronic kidney disease: Secondary | ICD-10-CM | POA: Diagnosis not present

## 2019-03-30 DIAGNOSIS — Z7409 Other reduced mobility: Secondary | ICD-10-CM | POA: Diagnosis not present

## 2019-03-30 DIAGNOSIS — I480 Paroxysmal atrial fibrillation: Secondary | ICD-10-CM | POA: Diagnosis not present

## 2019-03-30 DIAGNOSIS — K810 Acute cholecystitis: Secondary | ICD-10-CM | POA: Diagnosis not present

## 2019-03-31 DIAGNOSIS — I4891 Unspecified atrial fibrillation: Secondary | ICD-10-CM | POA: Diagnosis not present

## 2019-03-31 DIAGNOSIS — R008 Other abnormalities of heart beat: Secondary | ICD-10-CM | POA: Diagnosis not present

## 2019-03-31 DIAGNOSIS — I472 Ventricular tachycardia: Secondary | ICD-10-CM | POA: Diagnosis not present

## 2019-04-03 DIAGNOSIS — J449 Chronic obstructive pulmonary disease, unspecified: Secondary | ICD-10-CM | POA: Diagnosis not present

## 2019-04-06 DIAGNOSIS — Z934 Other artificial openings of gastrointestinal tract status: Secondary | ICD-10-CM | POA: Diagnosis not present

## 2019-04-06 DIAGNOSIS — Z4682 Encounter for fitting and adjustment of non-vascular catheter: Secondary | ICD-10-CM | POA: Diagnosis not present

## 2019-04-06 DIAGNOSIS — K810 Acute cholecystitis: Secondary | ICD-10-CM | POA: Diagnosis not present

## 2019-04-06 DIAGNOSIS — K8020 Calculus of gallbladder without cholecystitis without obstruction: Secondary | ICD-10-CM | POA: Diagnosis not present

## 2019-04-13 ENCOUNTER — Emergency Department (HOSPITAL_COMMUNITY): Payer: Medicare HMO

## 2019-04-13 ENCOUNTER — Encounter (HOSPITAL_COMMUNITY): Payer: Self-pay | Admitting: *Deleted

## 2019-04-13 ENCOUNTER — Observation Stay (HOSPITAL_COMMUNITY): Payer: Medicare HMO

## 2019-04-13 ENCOUNTER — Inpatient Hospital Stay (HOSPITAL_COMMUNITY)
Admission: EM | Admit: 2019-04-13 | Discharge: 2019-04-19 | DRG: 377 | Disposition: A | Discharge status: Home Health | Payer: Medicare HMO | Attending: Internal Medicine | Admitting: Internal Medicine

## 2019-04-13 ENCOUNTER — Other Ambulatory Visit: Payer: Self-pay

## 2019-04-13 DIAGNOSIS — K811 Chronic cholecystitis: Secondary | ICD-10-CM | POA: Diagnosis present

## 2019-04-13 DIAGNOSIS — D500 Iron deficiency anemia secondary to blood loss (chronic): Secondary | ICD-10-CM | POA: Diagnosis not present

## 2019-04-13 DIAGNOSIS — R188 Other ascites: Secondary | ICD-10-CM | POA: Diagnosis not present

## 2019-04-13 DIAGNOSIS — K922 Gastrointestinal hemorrhage, unspecified: Secondary | ICD-10-CM | POA: Diagnosis not present

## 2019-04-13 DIAGNOSIS — Z7982 Long term (current) use of aspirin: Secondary | ICD-10-CM

## 2019-04-13 DIAGNOSIS — K819 Cholecystitis, unspecified: Secondary | ICD-10-CM | POA: Diagnosis present

## 2019-04-13 DIAGNOSIS — N183 Chronic kidney disease, stage 3 unspecified: Secondary | ICD-10-CM | POA: Diagnosis not present

## 2019-04-13 DIAGNOSIS — K2970 Gastritis, unspecified, without bleeding: Secondary | ICD-10-CM | POA: Diagnosis present

## 2019-04-13 DIAGNOSIS — Z952 Presence of prosthetic heart valve: Secondary | ICD-10-CM | POA: Diagnosis not present

## 2019-04-13 DIAGNOSIS — Z7901 Long term (current) use of anticoagulants: Secondary | ICD-10-CM

## 2019-04-13 DIAGNOSIS — I5032 Chronic diastolic (congestive) heart failure: Secondary | ICD-10-CM | POA: Diagnosis not present

## 2019-04-13 DIAGNOSIS — J449 Chronic obstructive pulmonary disease, unspecified: Secondary | ICD-10-CM | POA: Diagnosis present

## 2019-04-13 DIAGNOSIS — K219 Gastro-esophageal reflux disease without esophagitis: Secondary | ICD-10-CM | POA: Diagnosis present

## 2019-04-13 DIAGNOSIS — Z87891 Personal history of nicotine dependence: Secondary | ICD-10-CM

## 2019-04-13 DIAGNOSIS — K317 Polyp of stomach and duodenum: Secondary | ICD-10-CM | POA: Diagnosis present

## 2019-04-13 DIAGNOSIS — G4733 Obstructive sleep apnea (adult) (pediatric): Secondary | ICD-10-CM | POA: Diagnosis present

## 2019-04-13 DIAGNOSIS — I2510 Atherosclerotic heart disease of native coronary artery without angina pectoris: Secondary | ICD-10-CM | POA: Diagnosis not present

## 2019-04-13 DIAGNOSIS — R001 Bradycardia, unspecified: Secondary | ICD-10-CM | POA: Diagnosis not present

## 2019-04-13 DIAGNOSIS — R0789 Other chest pain: Secondary | ICD-10-CM | POA: Diagnosis present

## 2019-04-13 DIAGNOSIS — I5033 Acute on chronic diastolic (congestive) heart failure: Secondary | ICD-10-CM | POA: Diagnosis not present

## 2019-04-13 DIAGNOSIS — L03311 Cellulitis of abdominal wall: Secondary | ICD-10-CM | POA: Diagnosis not present

## 2019-04-13 DIAGNOSIS — R1310 Dysphagia, unspecified: Secondary | ICD-10-CM | POA: Diagnosis not present

## 2019-04-13 DIAGNOSIS — D689 Coagulation defect, unspecified: Secondary | ICD-10-CM

## 2019-04-13 DIAGNOSIS — I130 Hypertensive heart and chronic kidney disease with heart failure and stage 1 through stage 4 chronic kidney disease, or unspecified chronic kidney disease: Secondary | ICD-10-CM | POA: Diagnosis not present

## 2019-04-13 DIAGNOSIS — Z881 Allergy status to other antibiotic agents status: Secondary | ICD-10-CM

## 2019-04-13 DIAGNOSIS — D631 Anemia in chronic kidney disease: Secondary | ICD-10-CM | POA: Diagnosis present

## 2019-04-13 DIAGNOSIS — F419 Anxiety disorder, unspecified: Secondary | ICD-10-CM | POA: Diagnosis present

## 2019-04-13 DIAGNOSIS — R17 Unspecified jaundice: Secondary | ICD-10-CM | POA: Insufficient documentation

## 2019-04-13 DIAGNOSIS — K921 Melena: Secondary | ICD-10-CM | POA: Diagnosis not present

## 2019-04-13 DIAGNOSIS — R079 Chest pain, unspecified: Secondary | ICD-10-CM | POA: Diagnosis present

## 2019-04-13 DIAGNOSIS — Z79899 Other long term (current) drug therapy: Secondary | ICD-10-CM

## 2019-04-13 DIAGNOSIS — R748 Abnormal levels of other serum enzymes: Secondary | ICD-10-CM | POA: Insufficient documentation

## 2019-04-13 DIAGNOSIS — I4891 Unspecified atrial fibrillation: Secondary | ICD-10-CM

## 2019-04-13 DIAGNOSIS — I259 Chronic ischemic heart disease, unspecified: Secondary | ICD-10-CM

## 2019-04-13 DIAGNOSIS — D61818 Other pancytopenia: Secondary | ICD-10-CM | POA: Diagnosis present

## 2019-04-13 DIAGNOSIS — Z434 Encounter for attention to other artificial openings of digestive tract: Secondary | ICD-10-CM | POA: Diagnosis not present

## 2019-04-13 DIAGNOSIS — Z20822 Contact with and (suspected) exposure to covid-19: Secondary | ICD-10-CM | POA: Diagnosis present

## 2019-04-13 DIAGNOSIS — I480 Paroxysmal atrial fibrillation: Secondary | ICD-10-CM | POA: Diagnosis present

## 2019-04-13 DIAGNOSIS — N289 Disorder of kidney and ureter, unspecified: Secondary | ICD-10-CM

## 2019-04-13 DIAGNOSIS — I350 Nonrheumatic aortic (valve) stenosis: Secondary | ICD-10-CM | POA: Diagnosis not present

## 2019-04-13 DIAGNOSIS — D649 Anemia, unspecified: Secondary | ICD-10-CM

## 2019-04-13 DIAGNOSIS — R0902 Hypoxemia: Secondary | ICD-10-CM | POA: Diagnosis not present

## 2019-04-13 DIAGNOSIS — D696 Thrombocytopenia, unspecified: Secondary | ICD-10-CM | POA: Diagnosis present

## 2019-04-13 DIAGNOSIS — D62 Acute posthemorrhagic anemia: Secondary | ICD-10-CM | POA: Diagnosis present

## 2019-04-13 DIAGNOSIS — T8579XA Infection and inflammatory reaction due to other internal prosthetic devices, implants and grafts, initial encounter: Secondary | ICD-10-CM

## 2019-04-13 DIAGNOSIS — Z955 Presence of coronary angioplasty implant and graft: Secondary | ICD-10-CM

## 2019-04-13 DIAGNOSIS — F329 Major depressive disorder, single episode, unspecified: Secondary | ICD-10-CM | POA: Diagnosis present

## 2019-04-13 DIAGNOSIS — N179 Acute kidney failure, unspecified: Secondary | ICD-10-CM | POA: Diagnosis not present

## 2019-04-13 DIAGNOSIS — E1122 Type 2 diabetes mellitus with diabetic chronic kidney disease: Secondary | ICD-10-CM | POA: Diagnosis present

## 2019-04-13 DIAGNOSIS — E119 Type 2 diabetes mellitus without complications: Secondary | ICD-10-CM | POA: Diagnosis present

## 2019-04-13 HISTORY — DX: Depression, unspecified: F32.A

## 2019-04-13 HISTORY — DX: Paroxysmal atrial fibrillation: I48.0

## 2019-04-13 HISTORY — DX: Anxiety disorder, unspecified: F41.9

## 2019-04-13 HISTORY — DX: Anemia, unspecified: D64.9

## 2019-04-13 HISTORY — DX: Nonrheumatic aortic (valve) stenosis: I35.0

## 2019-04-13 HISTORY — DX: Obesity, unspecified: E66.9

## 2019-04-13 HISTORY — DX: Presence of prosthetic heart valve: Z95.2

## 2019-04-13 HISTORY — DX: Chronic ischemic heart disease, unspecified: I25.9

## 2019-04-13 HISTORY — DX: Cholecystitis, unspecified: K81.9

## 2019-04-13 HISTORY — DX: Other pancytopenia: D61.818

## 2019-04-13 HISTORY — DX: Abdominal aortic aneurysm, without rupture, unspecified: I71.40

## 2019-04-13 HISTORY — DX: Disorder of arteries and arterioles, unspecified: I77.9

## 2019-04-13 HISTORY — DX: Bradycardia, unspecified: R00.1

## 2019-04-13 HISTORY — DX: Obstructive sleep apnea (adult) (pediatric): G47.33

## 2019-04-13 HISTORY — DX: Neoplasm of uncertain behavior of bladder: D41.4

## 2019-04-13 HISTORY — DX: Abdominal aortic aneurysm, without rupture: I71.4

## 2019-04-13 LAB — CBC WITH DIFFERENTIAL/PLATELET
Abs Immature Granulocytes: 0.06 10*3/uL (ref 0.00–0.07)
Basophils Absolute: 0 10*3/uL (ref 0.0–0.1)
Basophils Relative: 0 %
Eosinophils Absolute: 0.1 10*3/uL (ref 0.0–0.5)
Eosinophils Relative: 2 %
HCT: 26 % — ABNORMAL LOW (ref 39.0–52.0)
Hemoglobin: 7.6 g/dL — ABNORMAL LOW (ref 13.0–17.0)
Immature Granulocytes: 1 %
Lymphocytes Relative: 6 %
Lymphs Abs: 0.4 10*3/uL — ABNORMAL LOW (ref 0.7–4.0)
MCH: 28.8 pg (ref 26.0–34.0)
MCHC: 29.2 g/dL — ABNORMAL LOW (ref 30.0–36.0)
MCV: 98.5 fL (ref 80.0–100.0)
Monocytes Absolute: 0.5 10*3/uL (ref 0.1–1.0)
Monocytes Relative: 7 %
Neutro Abs: 6.4 10*3/uL (ref 1.7–7.7)
Neutrophils Relative %: 84 %
Platelets: 154 10*3/uL (ref 150–400)
RBC: 2.64 MIL/uL — ABNORMAL LOW (ref 4.22–5.81)
RDW: 16.6 % — ABNORMAL HIGH (ref 11.5–15.5)
WBC: 7.5 10*3/uL (ref 4.0–10.5)
nRBC: 0 % (ref 0.0–0.2)

## 2019-04-13 LAB — TROPONIN I (HIGH SENSITIVITY)
Troponin I (High Sensitivity): 20 ng/L — ABNORMAL HIGH (ref <18)
Troponin I (High Sensitivity): 19 ng/L — ABNORMAL HIGH (ref <18)

## 2019-04-13 LAB — COMPREHENSIVE METABOLIC PANEL
ALT: 18 U/L (ref 0–44)
AST: 37 U/L (ref 15–41)
Albumin: 3.3 g/dL — ABNORMAL LOW (ref 3.5–5.0)
Alkaline Phosphatase: 171 U/L — ABNORMAL HIGH (ref 38–126)
Anion gap: 13 (ref 5–15)
BUN: 42 mg/dL — ABNORMAL HIGH (ref 8–23)
CO2: 31 mmol/L (ref 22–32)
Calcium: 8.6 mg/dL — ABNORMAL LOW (ref 8.9–10.3)
Chloride: 94 mmol/L — ABNORMAL LOW (ref 98–111)
Creatinine, Ser: 2.21 mg/dL — ABNORMAL HIGH (ref 0.61–1.24)
GFR calc Af Amer: 32 mL/min — ABNORMAL LOW (ref >60)
GFR calc non Af Amer: 28 mL/min — ABNORMAL LOW (ref >60)
Glucose, Bld: 183 mg/dL — ABNORMAL HIGH (ref 70–99)
Potassium: 3.7 mmol/L (ref 3.5–5.1)
Sodium: 138 mmol/L (ref 135–145)
Total Bilirubin: 1.4 mg/dL — ABNORMAL HIGH (ref 0.3–1.2)
Total Protein: 6.8 g/dL (ref 6.5–8.1)

## 2019-04-13 LAB — TSH: TSH: 15.91 u[IU]/mL — ABNORMAL HIGH (ref 0.350–4.500)

## 2019-04-13 LAB — GLUCOSE, CAPILLARY
Glucose-Capillary: 213 mg/dL — ABNORMAL HIGH (ref 70–99)
Glucose-Capillary: 216 mg/dL — ABNORMAL HIGH (ref 70–99)

## 2019-04-13 LAB — POC OCCULT BLOOD, ED: Fecal Occult Bld: POSITIVE — AB

## 2019-04-13 LAB — HEMOGLOBIN A1C
Hgb A1c MFr Bld: 6.9 % — ABNORMAL HIGH (ref 4.8–5.6)
Mean Plasma Glucose: 151.33 mg/dL

## 2019-04-13 LAB — SARS CORONAVIRUS 2 (TAT 6-24 HRS): SARS Coronavirus 2: NEGATIVE

## 2019-04-13 LAB — LIPASE, BLOOD: Lipase: 29 U/L (ref 11–51)

## 2019-04-13 LAB — LIPID PANEL
Cholesterol: 77 mg/dL (ref 0–200)
HDL: 23 mg/dL — ABNORMAL LOW (ref >40)
LDL Cholesterol: 36 mg/dL (ref 0–99)
Total CHOL/HDL Ratio: 3.3 RATIO
Triglycerides: 90 mg/dL (ref <150)
VLDL: 18 mg/dL (ref 0–40)

## 2019-04-13 LAB — IRON AND TIBC
Iron: 23 ug/dL — ABNORMAL LOW (ref 45–182)
Saturation Ratios: 5 % — ABNORMAL LOW (ref 17.9–39.5)
TIBC: 455 ug/dL — ABNORMAL HIGH (ref 250–450)
UIBC: 432 ug/dL

## 2019-04-13 LAB — PREPARE RBC (CROSSMATCH)

## 2019-04-13 LAB — MRSA PCR SCREENING: MRSA by PCR: NEGATIVE

## 2019-04-13 LAB — ABO/RH: ABO/RH(D): O POS

## 2019-04-13 LAB — BRAIN NATRIURETIC PEPTIDE: B Natriuretic Peptide: 657 pg/mL — ABNORMAL HIGH (ref 0.0–100.0)

## 2019-04-13 LAB — FERRITIN: Ferritin: 43 ng/mL (ref 24–336)

## 2019-04-13 MED ORDER — LORAZEPAM 0.5 MG PO TABS
0.5000 mg | ORAL_TABLET | Freq: Every evening | ORAL | Status: DC | PRN
  Administered 2019-04-13: 0.5 mg via ORAL
  Filled 2019-04-13: qty 1

## 2019-04-13 MED ORDER — INSULIN ASPART 100 UNIT/ML ~~LOC~~ SOLN: 0.0000 [IU] | Freq: Three times a day (TID) | SUBCUTANEOUS | Status: DC

## 2019-04-13 MED ORDER — CITALOPRAM HYDROBROMIDE 20 MG PO TABS
20.0000 mg | ORAL_TABLET | Freq: Every day | ORAL | Status: DC
  Administered 2019-04-13 – 2019-04-19 (×7): 20 mg via ORAL
  Filled 2019-04-13 (×7): qty 1

## 2019-04-13 MED ORDER — ONDANSETRON HCL 4 MG PO TABS: 4.0000 mg | ORAL_TABLET | Freq: Four times a day (QID) | ORAL | Status: DC | PRN

## 2019-04-13 MED ORDER — FUROSEMIDE 10 MG/ML IJ SOLN
20.0000 mg | Freq: Once | INTRAMUSCULAR | Status: AC
  Administered 2019-04-13: 20 mg via INTRAVENOUS
  Filled 2019-04-13: qty 2

## 2019-04-13 MED ORDER — SODIUM CHLORIDE 0.9 % IV SOLN
10.0000 mL/h | Freq: Once | INTRAVENOUS | Status: AC
  Administered 2019-04-13: 10 mL/h via INTRAVENOUS

## 2019-04-13 MED ORDER — CHLORHEXIDINE GLUCONATE CLOTH 2 % EX PADS
6.0000 | MEDICATED_PAD | Freq: Every day | CUTANEOUS | Status: DC
  Administered 2019-04-14 – 2019-04-19 (×6): 6 via TOPICAL

## 2019-04-13 MED ORDER — MORPHINE SULFATE (PF) 2 MG/ML IV SOLN: 1.0000 mg | INTRAVENOUS | Status: DC | PRN

## 2019-04-13 MED ORDER — INSULIN ASPART 100 UNIT/ML ~~LOC~~ SOLN
3.0000 [IU] | Freq: Three times a day (TID) | SUBCUTANEOUS | Status: DC
  Administered 2019-04-13 – 2019-04-19 (×16): 3 [IU] via SUBCUTANEOUS

## 2019-04-13 MED ORDER — INSULIN ASPART 100 UNIT/ML ~~LOC~~ SOLN: 3.0000 [IU] | Freq: Three times a day (TID) | SUBCUTANEOUS | Status: DC

## 2019-04-13 MED ORDER — INSULIN ASPART 100 UNIT/ML ~~LOC~~ SOLN
0.0000 [IU] | Freq: Three times a day (TID) | SUBCUTANEOUS | Status: DC
  Administered 2019-04-13: 3 [IU] via SUBCUTANEOUS
  Administered 2019-04-14 (×3): 2 [IU] via SUBCUTANEOUS
  Administered 2019-04-15 (×2): 1 [IU] via SUBCUTANEOUS
  Administered 2019-04-15: 2 [IU] via SUBCUTANEOUS
  Administered 2019-04-16: 5 [IU] via SUBCUTANEOUS
  Administered 2019-04-16: 2 [IU] via SUBCUTANEOUS
  Administered 2019-04-16: 3 [IU] via SUBCUTANEOUS
  Administered 2019-04-17: 1 [IU] via SUBCUTANEOUS
  Administered 2019-04-17 (×2): 3 [IU] via SUBCUTANEOUS
  Administered 2019-04-18: 2 [IU] via SUBCUTANEOUS
  Administered 2019-04-18: 9 [IU] via SUBCUTANEOUS
  Administered 2019-04-18: 2 [IU] via SUBCUTANEOUS
  Administered 2019-04-19: 3 [IU] via SUBCUTANEOUS

## 2019-04-13 MED ORDER — OCUVITE-LUTEIN PO CAPS
1.0000 | ORAL_CAPSULE | Freq: Every day | ORAL | Status: DC
  Administered 2019-04-13 – 2019-04-19 (×7): 1 via ORAL
  Filled 2019-04-13 (×7): qty 1

## 2019-04-13 MED ORDER — ROSUVASTATIN CALCIUM 10 MG PO TABS
10.0000 mg | ORAL_TABLET | Freq: Every day | ORAL | Status: DC
  Administered 2019-04-13 – 2019-04-18 (×6): 10 mg via ORAL
  Filled 2019-04-13 (×6): qty 1

## 2019-04-13 MED ORDER — LEVOTHYROXINE SODIUM 75 MCG PO TABS
150.0000 ug | ORAL_TABLET | Freq: Every day | ORAL | Status: DC
  Administered 2019-04-14 – 2019-04-19 (×6): 150 ug via ORAL
  Filled 2019-04-13 (×7): qty 2

## 2019-04-13 MED ORDER — SODIUM CHLORIDE 0.9% FLUSH: 3.0000 mL | INTRAVENOUS | Status: DC | PRN

## 2019-04-13 MED ORDER — NITROGLYCERIN 0.3 MG SL SUBL
0.3000 mg | SUBLINGUAL_TABLET | SUBLINGUAL | Status: DC | PRN
  Filled 2019-04-13: qty 100

## 2019-04-13 MED ORDER — SODIUM CHLORIDE 0.9 % IV BOLUS
1000.0000 mL | Freq: Once | INTRAVENOUS | Status: AC
  Administered 2019-04-13: 1000 mL via INTRAVENOUS

## 2019-04-13 MED ORDER — PANTOPRAZOLE SODIUM 40 MG IV SOLR
40.0000 mg | Freq: Two times a day (BID) | INTRAVENOUS | Status: DC
  Administered 2019-04-13 – 2019-04-15 (×5): 40 mg via INTRAVENOUS
  Filled 2019-04-13 (×5): qty 40

## 2019-04-13 MED ORDER — ONDANSETRON HCL 4 MG/2ML IJ SOLN
4.0000 mg | Freq: Four times a day (QID) | INTRAMUSCULAR | Status: DC | PRN
  Administered 2019-04-13: 4 mg via INTRAVENOUS
  Filled 2019-04-13: qty 2

## 2019-04-13 MED ORDER — MOMETASONE FURO-FORMOTEROL FUM 100-5 MCG/ACT IN AERO
2.0000 | INHALATION_SPRAY | Freq: Two times a day (BID) | RESPIRATORY_TRACT | Status: DC
  Administered 2019-04-13 – 2019-04-19 (×12): 2 via RESPIRATORY_TRACT
  Filled 2019-04-13 (×2): qty 8.8

## 2019-04-13 MED ORDER — ORAL CARE MOUTH RINSE
15.0000 mL | Freq: Two times a day (BID) | OROMUCOSAL | Status: DC
  Administered 2019-04-13 – 2019-04-19 (×12): 15 mL via OROMUCOSAL

## 2019-04-13 MED ORDER — SODIUM CHLORIDE 0.9 % IV SOLN: 250.0000 mL | INTRAVENOUS | Status: DC | PRN

## 2019-04-13 MED ORDER — ACETAMINOPHEN 325 MG PO TABS
650.0000 mg | ORAL_TABLET | Freq: Four times a day (QID) | ORAL | Status: DC | PRN
  Administered 2019-04-13: 650 mg via ORAL
  Filled 2019-04-13: qty 2

## 2019-04-13 MED ORDER — NITROGLYCERIN 0.4 MG SL SUBL: 0.4000 mg | SUBLINGUAL_TABLET | SUBLINGUAL | Status: DC | PRN

## 2019-04-13 MED ORDER — INSULIN ASPART 100 UNIT/ML ~~LOC~~ SOLN
0.0000 [IU] | Freq: Every day | SUBCUTANEOUS | Status: DC
  Administered 2019-04-13 – 2019-04-17 (×3): 2 [IU] via SUBCUTANEOUS

## 2019-04-13 MED ORDER — ACETAMINOPHEN 650 MG RE SUPP: 650.0000 mg | Freq: Four times a day (QID) | RECTAL | Status: DC | PRN

## 2019-04-13 MED ORDER — TRAZODONE HCL 50 MG PO TABS
25.0000 mg | ORAL_TABLET | Freq: Every evening | ORAL | Status: DC | PRN
  Administered 2019-04-13 – 2019-04-18 (×4): 25 mg via ORAL
  Filled 2019-04-13 (×4): qty 1

## 2019-04-13 MED ORDER — SODIUM CHLORIDE 0.9% FLUSH
3.0000 mL | Freq: Two times a day (BID) | INTRAVENOUS | Status: DC
  Administered 2019-04-13 – 2019-04-19 (×12): 3 mL via INTRAVENOUS

## 2019-04-13 NOTE — ED Notes (Addendum)
Updated patient's spouse. States she wants to leave name and number to call if needed. Name is [name], number is [phone].

## 2019-04-13 NOTE — ED Triage Notes (Signed)
Pt brought in by [facility] for c/o chest pain that started x one hour ago; pt states the pain woke him up; pt took 4 baby asa and 3 nitro SL prior to ems arrival; cbg 212; pt denies any pain at the moment

## 2019-04-13 NOTE — H&P (Addendum)
History and Physical  [facility]  Matthew Campos [id number] DOB: 03-10-1941 DOA: 04/13/2019  PCP: System, Pcp Not In  Patient coming from: [facility] I have personally briefly reviewed patient's old medical records in [place]  Chief Complaint: Chest pain  HPI: Matthew Campos is a 78 y.o. 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  He says he had chest pain severe in the center part of the chest that woke him up from sleeping around 2:30 AM.  He took 4 baby aspirin and 3 sublingual nitroglycerin prior to EMS arrival.  He says that the pain resolved after that point.  He says that nothing seemed to make the pain worse as it was pain at rest.  He says it felt similar to when he had a heart attack in the past.  He had no shortness of breath or diaphoresis or nausea associated with this chest pain episode.  He says that overall the pain lasted for approximately 1 hour and then it resolved.  He is currently chest pain-free.  He says that he has been receiving most of his care at [facility] where he had his valve replacement and cholecystostomy.  He says that the tube is in place for an indefinite amount of time.  It does not have a bowel drain on it.  He is fully anticoagulated on apixaban.  In the ED he was noted to be anemic and was heme positive.  ED Course: Hemoglobin 7.6, creatinine 2.21, bilirubin 1.4, hypotensive initially with a BP of 82/51.  Heme positive stool.  He was bolused with IV fluids with some improvement in blood pressure.  He was typed and screened  for 2 units packed red blood cell transfusion.  His apixaban was placed on hold.  GI was consulted.  Cardiology was consulted.  Was accepted for admission by [facility] by Dr. Olevia Bowens for a stepdown unit bed  Review of Systems: As per HPI otherwise 10 point review of systems negative.    Past Medical History:  Diagnosis Date  . Abdominal aortic aneurysm without rupture ([place])   . Anemia   . Anxiety and depression   . 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[place])   . CHF (congestive heart failure) ([place])   . Cholecystitis   . Chronic ischemic heart disease   . COPD (chronic obstructive pulmonary disease) ([place])   . Coronary artery disease   . Diabetes mellitus without complication ([place])   . GERD (gastroesophageal reflux disease)   . Hypertension   . Neoplasm of uncertain behavior of bladder   . Obesity   . OSA (obstructive sleep apnea)   . Pancytopenia ([place])   . Paroxysmal atrial fibrillation ([facility])   . S/P TAVR (transcatheter aortic valve replacement)   . Severe aortic stenosis   . Sinus bradycardia     Past Surgical History:  Procedure Laterality Date  . AORTIC VALVE REPLACEMENT    . TONSILLECTOMY       reports that he has quit smoking. He has never used smokeless tobacco. He reports previous alcohol use. He reports previous drug use.  Allergies  Allergen Reactions  . Codeine   . Lisinopril  History reviewed. No pertinent family history.   Prior to Admission medications     Albuterol nebulizer solution every 6 hours Ventolin HFA to use as needed every 6 hours as needed shortness of breath and wheezing Alka-Seltzer 1 tablet daily Apixaban 5 mg twice daily Aspirin 81 mg daily Symbicort 2 puffs twice daily Calcium carbonate 200 mg 1 tablet by mouth 3 times daily as needed Lexapro 10 mg daily Lorazepam 0.5 mg nightly as needed anxiety Losartan 100 mg daily Multivitamin 1 tablet daily Nitroglycerin 0.4 mg sublingual tablet as needed chest pain Omeprazole 40 mg 2 times daily Ondansetron  4 mg disintegrating tablet take 4 mg by mouth as needed Rosuvastatin 10 mg take 1 by mouth daily Sitagliptin 25 mg p.o. daily Systane balance 0.6% drops 1 drop in both eyes daily as needed for allergy Torsemide 20 mg take 2 tablets in a.m. and 1 tablet in p.m.  Physical Exam: Vitals:   04/13/19 0627 04/13/19 0630 04/13/19 0631 04/13/19 0730  BP:  (!) 95/51  (!) 100/47  Pulse: (!) 52  (!) 101 (!) 45  Resp: 15 15 15 16   Temp:      TempSrc:      SpO2: (!) 88%  94% 92%  Weight:      Height:        Constitutional: NAD, calm, comfortable Vitals:   04/13/19 0627 04/13/19 0630 04/13/19 0631 04/13/19 0730  BP:  (!) 95/51  (!) 100/47  Pulse: (!) 52  (!) 101 (!) 45  Resp: 15 15 15 16   Temp:      TempSrc:      SpO2: (!) 88%  94% 92%  Weight:      Height:       Eyes: Chronically ill-appearing male he is lying in bed he is awake and alert and in no distress and denies chest pain PERRL, lids and conjunctivae normal ENMT: Mucous membranes are moist. Posterior pharynx clear of any exudate or lesions.Normal dentition.  Neck: normal, supple, no masses, no thyromegaly Respiratory: Bibasilar crackles heard, no wheezing, no crackles. Normal respiratory effort. No accessory muscle use.  Cardiovascular: Irregular rhythm, bradycardic rate, 2+ pitting pretibial edema bilateral lower extremities.   Abdomen: Obese with large ventral hernia, no tenderness, no masses palpated.  Cholecystostomy tube seen in the right abdominal wall appears clean no signs of infection seen around the tube drain no bulb attached.  No hepatosplenomegaly. Bowel sounds positive.  Musculoskeletal: 2+ pitting edema bilateral lower extremities, no clubbing / cyanosis. No joint deformity upper and lower extremities. Good ROM, no contractures. Normal muscle tone.  Skin: no rashes, lesions, ulcers. No induration Neurologic: CN 2-12 grossly intact. Sensation intact, DTR normal. Strength 5/5 in all 4.  Psychiatric: Normal judgment and  insight. Alert and oriented x 3. Normal mood.   Labs on Admission: I have personally reviewed following labs and imaging studies  CBC: Recent Labs  Lab 04/13/19 0500  WBC 7.5  NEUTROABS 6.4  HGB 7.6*  HCT 26.0*  MCV 98.5  PLT 123456   Basic Metabolic Panel: Recent Labs  Lab 04/13/19 0500  NA 138  K 3.7  CL 94*  CO2 31  GLUCOSE 183*  BUN 42*  CREATININE 2.21*  CALCIUM 8.6*   GFR: Estimated Creatinine Clearance: 31.1 mL/min (A) (by C-G formula based on SCr of 2.21 mg/dL (H)). Liver Function Tests: Recent Labs  Lab 04/13/19 0500  AST 37  ALT 18  ALKPHOS 171*  BILITOT 1.4*  PROT 6.8  ALBUMIN 3.3*  Recent Labs  Lab 04/13/19 0500  LIPASE 29   No results for input(s): AMMONIA in the last 168 hours. Coagulation Profile: No results for input(s): INR, PROTIME in the last 168 hours. Cardiac Enzymes: No results for input(s): CKTOTAL, CKMB, CKMBINDEX, TROPONINI in the last 168 hours. BNP (last 3 results) No results for input(s): PROBNP in the last 8760 hours. HbA1C: No results for input(s): HGBA1C in the last 72 hours. CBG: No results for input(s): GLUCAP in the last 168 hours. Lipid Profile: No results for input(s): CHOL, HDL, LDLCALC, TRIG, CHOLHDL, LDLDIRECT in the last 72 hours. Thyroid Function Tests: No results for input(s): TSH, T4TOTAL, FREET4, T3FREE, THYROIDAB in the last 72 hours. Anemia Panel: No results for input(s): VITAMINB12, FOLATE, FERRITIN, TIBC, IRON, RETICCTPCT in the last 72 hours. Urine analysis: No results found for: COLORURINE, APPEARANCEUR, LABSPEC, PHURINE, GLUCOSEU, HGBUR, BILIRUBINUR, KETONESUR, PROTEINUR, UROBILINOGEN, NITRITE, LEUKOCYTESUR  Radiological Exams on Admission: DG Chest Port 1 View  Result Date: 04/13/2019 CLINICAL DATA:  Awoke from sleep with chest pain. EXAM: PORTABLE CHEST 1 VIEW COMPARISON:  None. FINDINGS: The heart is enlarged. Prosthetic aortic valve. Vascular congestion with perivascular haziness. Suspected  small bilateral pleural effusions. No pneumothorax. No acute osseous abnormalities are seen. Symmetric widening of the acromioclavicular joints. IMPRESSION: Cardiomegaly with vascular congestion and perivascular haziness, suspicious for pulmonary edema. Suspected small bilateral pleural effusions. Overall findings suspicious for CHF. Electronically Signed   By: Keith Rake M.D.   On: 04/13/2019 04:58   EKG: Independently reviewed.  Atrial fibrillation  Assessment/Plan Active Problems:   UGI bleed   Chest pain   History of recent transcatheter aortic valve replacement (TAVR)   Cholecystostomy care (HCC)   Chronic blood loss anemia   COPD (chronic obstructive pulmonary disease) (HCC)   Coronary artery disease   Diabetes mellitus without complication ([facility]CC)   Chronic ischemic heart disease   GERD (gastroesophageal reflux disease)   Paroxysmal atrial fibrillation (HCC)   Cholecystitis   OSA (obstructive sleep apnea)   Coagulopathy (HCC)   Chronic anticoagulation   1. Chest pain-patient did have a significant chest pain episode early this morning with 10 out of 10 pain but after taking nitroglycerin and aspirin his symptoms have completely resolved.  His initial been 20 and repeat has been 19.  With his significant cardiac history we are asked for cardiology opinion regarding any further steps in work-up. 2. AKI on CKD stage 3 - reviewing old labs 01/2019 his creatinine was 1.4 and GFR was 45 at that time,  hold diuretics, hold losartan, follow renal function panel closely.  Renally dose medications.  Check renal ultrasound. Consult nephrology service.  3. Paroxysmal atrial fibrillation-his rate is well controlled.  He is fully anticoagulated with apixaban which we are currently holding inside of GI bleeding and heme positive stool 4. GERD-Protonix ordered for GI protection IV 40 mg twice daily. 5. Guaiac positive stools-he has been started on empiric IV Protonix as above.  Holding apixaban  and aspirin and asked for GI consult.  He has been typed and screened and transfused 2 units packed red blood cells. 6. Chronic blood loss anemia-likely secondary to upper GI bleeding-transfusing 2 units PRBCs as ordered. 7. Chronic cholecystitis-pt has a cholecystostomy tube in place which he says is indefinite.  He has follow up with [facility]. 8. Hyperbilirubinemia - will follow.  9. COPD-stable resume home bronchodilators. 10. Hyperlipidemia-resume home rosuvastatin.  Critical Care Procedure Note Authorized and Performed by: Murvin Natal MD  Total Critical Care time:  58 mins Due to a high probability of clinically significant, life threatening deterioration, the patient required my highest level of preparedness to intervene emergently and I personally spent this critical care time directly and personally managing the patient.  This critical care time included obtaining a history; examining the patient, pulse oximetry; ordering and review of studies; arranging urgent treatment with development of a management plan; evaluation of patient's response of treatment; frequent reassessment; and discussions with other providers.  This critical care time was performed to assess and manage the high probability of imminent and life threatening deterioration that could result in multi-organ failure.  It was exclusive of separately billable procedures and treating other patients and teaching time.    DVT prophylaxis: SCDs Code Status: Full Family Communication: No family present at bedside Disposition Plan:   Consults called: GI, cardio Admission status: Patient  Irwin Brakeman MD [facility] How to contact the [facility] Attending or Consulting provider [place] or covering provider during after hours [place], for this patient?  1. Check the care team in [facility] and look for a) attending/consulting [facility] provider listed and b) the [facility] team listed 2. Log into [email or web address] and use [place]'s universal password  to access. If you do not have the password, please contact the hospital operator. 3. Locate the [facility] provider you are looking for under [facility] and page to a number that you can be directly reached. 4. If you still have difficulty reaching the provider, please page the [facility] (Director on Call) for the Hospitalists listed on amion for assistance.   If 7PM-7AM, please contact night-coverage [email or web address] Password [email or web address]  04/13/2019, 8:11 AM

## 2019-04-13 NOTE — Consult Note (Addendum)
Referring Provider: [facility] Primary Care Physician:  System, Pcp Not In Primary Gastroenterologist:  Dr. Oneida Alar (previously unassigned)  Date of Admission: 02/11/19 Date of Consultation: 02/11/19  Reason for Consultation:  Anemia; melena; heme positive stool  HPI:  Matthew Campos is a 78 y.o. year old male with history of coronary artery disease with 3 MIs s/p stent placement with last stent about 3 years ago, congestive heart failure, paroxysmal atrial fibrillation on apixaban, sinus bradycardia, severe aortic stenosis s/p recent TAVR, COPD, diabetes, HTN, GERD, chronic cholecystitis now with cholecystostomy tube in place, chronic anemia, chronic kidney disease, and OSA.  [facility] records in [facility]. He presented to the emergency department on 12/29 due to an episode of chest pain that began around 2:30 AM after drinking some water with flavored powder.  Pain was in his lower chest, 10/10 in severity, without radiation.  Per patient, pain was similar to prior heart attack.  Patient took 4 baby aspirin and 3 sublingual nitroglycerin prior to EMS arrival.  He denied pain upon presentation to the emergency room.   Work-up in the ED: EKG with probable atrial fibrillation with a slow ventricular response but no ST or T changes.  Initial Troponin minimally elevated at 20, repeat Troponin 19. Hemoglobin of 7.6, creatinine 2.21, bilirubin 1.4, alk phos 171. Lipase normal.  Blood pressure dropped to 82/51.  IV fluids given. Stool heme positive. CXR with cardiomegaly with vascular congestion and perivascular haziness, suspicious for pulmonary edema. Suspected small bilateral pleural effusions. Overall findings suspicious for CHF. He has been typed and screening and 2 units of Started on Protonix 40 mg IV BID. PRBCs has been ordered. Cardiology and GI consulted.   Today he states:  This morning he woke up to use the bathroom, took a swallow of propel, and started having central chest pain.  Called 911. Took 4 aspirin and 3 nitroglycerine. Chest pain resolved after about an hour. Currently without chest pain. Burped some and this helped relieve the pain. No associated nausea or vomiting. No dizziness, pre-syncope or syncope. Denies abdominal pain. No bright red blood in the stool. Admits to black stools. Black stools have been present for the last 2-3 months. Last melanotic stool yesterday. No BM this morning. Typically 1 BM daily. Sometimes stools are hard. Other times not. Was seen at [facility] for TAVR recently. Reports he has to have surgery repeated due to needing pig valve and they placed cow valve.  Saw PCP due to weakness about 3-4 months ago and was told he was anemic. Can't remember what his hemoglobin was. Denies history of anemia prior to this. Not currently on iron. Reports history of reflux. Takes 40 mg omeprazole every morning. This keeps symptoms pretty well controlled. Occasionally has to take an extra omeprazole in the evenings. Food gets hung in his esophagus. Present for 3 months but getting worse. Occurs mostly with breads and meats but also with liquids at times if he drinks fast. No prior upper endoscopy. Taking ibuprofen every morning for several years. Takes 1-2 capsules. This is to prevent heartburn. No other NSAIDs.   No prior colonoscopy.   Has cholecystostomy tube placed when he had TAVR. No drain bag on it at this time. Thinks he had it removed about 1-2 weeks ago. Was supposed to go back to [place] today to have the bag replaced.  Reports cholecystostomy tube was placed due to gallstones.  Last dose of Eliquis last night.   Takes lexapro and ativan for anxiety.  Per patient, since TAVR, his BP has been running low.   Past Medical History:  Diagnosis Date  . Abdominal aortic aneurysm without rupture ([place])   . Anemia   . Anxiety and depression   . 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[place])   . CHF (congestive heart failure) ([place])   . Cholecystitis   . Chronic ischemic  heart disease   . COPD (chronic obstructive pulmonary disease) ([place])   . Coronary artery disease   . Diabetes mellitus without complication ([place])   . GERD (gastroesophageal reflux disease)   . Hypertension   . Neoplasm of uncertain behavior of bladder   . Obesity   . OSA (obstructive sleep apnea)   . Pancytopenia ([place])   . Paroxysmal atrial fibrillation ([facility])   . S/P TAVR (transcatheter aortic valve replacement)   . Severe aortic stenosis   . Sinus bradycardia     Past Surgical History:  Procedure Laterality Date  . ABDOMINAL AORTIC ANEURYSM REPAIR     per patient stents placed about 4-5 years ago  . AORTIC VALVE REPLACEMENT    . cholecystostomy tube    . CORONARY ANGIOPLASTY WITH STENT PLACEMENT     about 30 years ago per patient  . TONSILLECTOMY      Prior to Admission medications   Not on File    Current Facility-Administered Medications  Medication Dose Route Frequency Provider Last Rate Last Admin  . LORazepam (ATIVAN) tablet 0.5 mg  0.5 mg Oral QHS PRN Johnson, Clanford L, MD      . morphine 2 MG/ML injection 1 mg  1 mg Intravenous Q3H PRN Johnson, Clanford L, MD      . nitroGLYCERIN (NITROSTAT) SL tablet 0.4 mg  0.4 mg Sublingual Q5 min PRN Johnson, Clanford L, MD      . pantoprazole (PROTONIX) injection 40 mg  40 mg Intravenous Q12H Reubin Milan, MD   40 mg at 04/13/19 6269   Current Outpatient Medications  Medication Sig Dispense Refill  . albuterol (PROVENTIL) (2.5 MG/3ML) 0.083% nebulizer solution Take 2.5 mg by nebulization every 6 (six) hours as needed for wheezing or shortness of breath.    [name] albuterol (VENTOLIN HFA) 108 (90 Base) MCG/ACT inhaler Inhale 2 puffs into the lungs every 6 (six) hours as needed. For shortness of breath.    [name] aspirin EC 81 MG tablet Take 81 mg by mouth daily.    [name] CALCIUM ANTACID 500 MG chewable tablet Chew 1 tablet by mouth 3 (three) times daily as needed.    [name] ELIQUIS 5 MG TABS tablet Take 5 mg by mouth 2 (two) times daily.     [name] escitalopram (LEXAPRO) 10 MG tablet Take 10 mg by mouth daily.    [name] LORazepam (ATIVAN) 0.5 MG tablet Take 1 tablet by mouth at bedtime as needed.    [name] losartan (COZAAR) 100 MG tablet Take 1 tablet by mouth daily.    . metoprolol tartrate (LOPRESSOR) 50 MG tablet Take 50 mg by mouth 2 (two) times daily.    . Multiple Vitamin (MULTIVITAMIN) capsule Take 1 capsule by mouth daily.    . nitroGLYCERIN (NITROSTAT) 0.4 MG SL tablet Place 0.4 mg under the tongue every 5 (five) minutes as needed for chest pain.    [name] omeprazole (PRILOSEC) 40 MG capsule Take 1 capsule by mouth 2 (two) times daily.    . ondansetron (ZOFRAN-ODT) 4 MG disintegrating tablet Take 1 tablet by mouth daily as needed.    [name] Propylene Glycol (SYSTANE BALANCE) 0.6 %  SOLN Apply 1 drop to eye daily as needed (1 GTT OU QD PRN FOR ALLERGIES.).    [name] rosuvastatin (CRESTOR) 10 MG tablet Take 10 mg by mouth daily.    . sitaGLIPtin (JANUVIA) 25 MG tablet Take 25 mg by mouth 2 (two) times daily.     . SYMBICORT 80-4.5 MCG/ACT inhaler Inhale 2 puffs into the lungs 2 (two) times daily as needed.    . torsemide (DEMADEX) 20 MG tablet Take 1 tablet by mouth 3 (three) times daily. TAKE 2 TABS PO EACH AM, 1 TAB PO EACH PM.      Allergies as of 04/13/2019 - Review Complete 04/13/2019  Allergen Reaction Noted  . Codeine  04/13/2019  . Lisinopril  04/13/2019    Family History  Problem Relation Age of Onset  . Colon cancer Mother        in her 2s.   . Stomach cancer Neg Hx   . Esophageal cancer Neg Hx     Social History   Socioeconomic History  . Marital status: Married    Spouse name: Not on file  . Number of children: Not on file  . Years of education: Not on file  . Highest education level: Not on file  Occupational History  . Not on file  Tobacco Use  . Smoking status: Former Research scientist (life sciences)  . Smokeless tobacco: Never Used  Substance and Sexual Activity  . Alcohol use: Not Currently    Comment: last drank about 2-3 years ago  .  Drug use: Not Currently  . Sexual activity: Not Currently  Other Topics Concern  . Not on file  Social History Narrative  . Not on file   Social Determinants of Health   Financial Resource Strain:   . Difficulty of Paying Living Expenses: Not on file  Food Insecurity:   . Worried About Charity fundraiser in the Last Year: Not on file  . Ran Out of Food in the Last Year: Not on file  Transportation Needs:   . Lack of Transportation (Medical): Not on file  . Lack of Transportation (Non-Medical): Not on file  Physical Activity:   . Days of Exercise per Week: Not on file  . Minutes of Exercise per Session: Not on file  Stress:   . Feeling of Stress : Not on file  Social Connections:   . Frequency of Communication with Friends and Family: Not on file  . Frequency of Social Gatherings with Friends and Family: Not on file  . Attends Religious Services: Not on file  . Active Member of Clubs or Organizations: Not on file  . Attends Archivist Meetings: Not on file  . Marital Status: Not on file  Intimate Partner Violence:   . Fear of Current or Ex-Partner: Not on file  . Emotionally Abused: Not on file  . Physically Abused: Not on file  . Sexually Abused: Not on file    Review of Systems: Gen: Denies fever, chills.  CV: Denies chest pain, heart palpitations.  Reports swelling in his leg started after TAVR. Resp: Denies shortness of breath at rest. Admits to shortness of breath with exertion. On 2L supplemental O2 chronically. Dry cough intermittently.  GI: See HPI GU : Denies urinary burning, urinary frequency, urinary incontinence.  MS: Denies joint pain Derm: Denies rash Psych: Denies depression, anxiety Heme: Denies bruising, bleeding other than reported melena.   Physical Exam: Vital signs in last 24 hours: Temp:  [97.9 F (36.6 C)] 97.9 F (  36.6 C) (12/29 0437) Pulse Rate:  [32-107] 57 (12/29 1200) Resp:  [12-23] 18 (12/29 1200) BP: (82-138)/(44-89) 138/58  (12/29 1200) SpO2:  [88 %-100 %] 95 % (12/29 1200) Weight:  [97.1 kg] 97.1 kg (12/29 0425)   General:   Alert,  Well-developed, well-nourished, pleasant and cooperative in NAD Head:  Normocephalic and atraumatic. Eyes:  Sclera clear, no icterus.   Conjunctiva pink. Ears:  Normal auditory acuity. Nose:  No deformity, discharge,  or lesions. Lungs: Crackles heard in the lung bases bilaterally. No wheezes.  On 2 L supplemental oxygen via nasal cannula.  Normal respiratory effort.   Heart: Irregular rhythm, bradycardic. Abdomen:  Protuberant abdomen, somewhat distended appearing. Per patient this is his normal. Soft and nontender to palpation other than at site of cholecystostomy tube. Normal bowel sounds, without guarding, and without rebound.   Rectal:  Deferred. Msk:  Symmetrical without gross deformities.  Extremities: 2+ pitting edema bilaterally in the lower extremities up to his knees. Neurologic:  Alert and  oriented x4;  grossly normal neurologically. Skin:  Intact without significant lesions or rashes. Psych: Normal mood and affect.  Intake/Output from previous day: No intake/output data recorded. Intake/Output this shift: No intake/output data recorded.  Lab Results: Recent Labs    04/13/19 0500  WBC 7.5  HGB 7.6*  HCT 26.0*  PLT 154   BMET Recent Labs    04/13/19 0500  NA 138  K 3.7  CL 94*  CO2 31  GLUCOSE 183*  BUN 42*  CREATININE 2.21*  CALCIUM 8.6*   LFT Recent Labs    04/13/19 0500  PROT 6.8  ALBUMIN 3.3*  AST 37  ALT 18  ALKPHOS 171*  BILITOT 1.4*   PT/INR No results for input(s): LABPROT, INR in the last 72 hours. Hepatitis Panel No results for input(s): HEPBSAG, HCVAB, HEPAIGM, HEPBIGM in the last 72 hours. C-Diff No results for input(s): CDIFFTOX in the last 72 hours.  Studies/Results: US Renal  Result Date: 04/13/2019 CLINICAL DATA:  Acute on chronic kidney injury. EXAM: RENAL / URINARY TRACT ULTRASOUND COMPLETE COMPARISON:  None  FINDINGS: Right Kidney: Renal measurements: 10.5 x 5.0 x 5.2 cm = volume: 141.0 mL . Echogenicity within normal limits. No mass or hydronephrosis visualized. Left Kidney: Renal measurements: 11.7 by 5.3 x 6.0 cm = volume: 194.1 mL. Echogenicity within normal limits. No mass or hydronephrosis visualized. Bladder: Not visualized Other: Mild to moderate volume of ascites identified within the right lower quadrant and left lower quadrant of the abdomen. IMPRESSION: Normal appearance of the kidneys.  No hydronephrosis or mass. Mild to moderate abdominal ascites Electronically Signed   By: Kerby Moors M.D.   On: 04/13/2019 10:54   DG Chest Port 1 View  Result Date: 04/13/2019 CLINICAL DATA:  Awoke from sleep with chest pain. EXAM: PORTABLE CHEST 1 VIEW COMPARISON:  None. FINDINGS: The heart is enlarged. Prosthetic aortic valve. Vascular congestion with perivascular haziness. Suspected small bilateral pleural effusions. No pneumothorax. No acute osseous abnormalities are seen. Symmetric widening of the acromioclavicular joints. IMPRESSION: Cardiomegaly with vascular congestion and perivascular haziness, suspicious for pulmonary edema. Suspected small bilateral pleural effusions. Overall findings suspicious for CHF. Electronically Signed   By: Keith Rake M.D.   On: 04/13/2019 04:58    Impression: 78 year old male with history of coronary artery disease s/p stent placements, CHF, paroxysmal atrial fibrillation on Eliquis, bradycardia, severe aortic stenosis s/p recent TAVR, COPD on chronic supplemental O2, diabetes, HTN, GERD, chronic cholecystitis now with cholecystostomy tube, chronic  anemia, chronic kidney disease, and OSA who presented to the emergency department on 12/29 due to acute onset of chest pain around 2:30 AM that began after drinking propel.  Patient took 4 baby aspirin and 3 sublingual nitroglycerin.  Pain resolved after an hour.  Belching helped relieve pain.  Initial work-up with slightly  elevated but flat troponins x2, EKG with probable atrial fibrillation, chest x-ray with findings suspicious for CHF.  He was hypotensive in the emergency department with blood pressure in the 80s/50s and he was given IV fluids.  Hemoglobin found to be 7.6 and stools heme positive.  Other significant lab findings as per above.  Patient was started on Protonix 40 mg IV twice daily and typed and screened for 2 units of PRBCs.  Cardiology and GI were consulted.  Anemia: Hemoglobin 7.6 on admission with MCHC slightly low at 29.2, MCH and MCV normal.  Per patient, melena present for the last 2-3 months. Not on iron.  Denies right red blood per rectum.  Takes omeprazole 40 mg daily for chronic GERD which works well.  Denies abdominal pain.  He does admit to dysphagia x3 months but has been worsening.  Admits to chronic daily ibuprofen use.  It appears patient has a second MRN where I can view his prior records.  He had mild anemia in the 12/13 range and 2018.  In January 2020, hemoglobin in the 11 range and has been downtrending ever since.  Most recently, 8.6 on 03/23/2019.  In September 2020, iron panel with ferritin 352 (H), iron 24 (L), TIBC 194 (L), transferrin 147 (L).   Differentials include gastritis, duodenitis, esophagitis, PUD, and AVMs likely influenced by chronic NSAID use and chronic anticoagulation with Eliquis.  Chronic kidney disease and recent surgeries also likely playing a role.  With anemia and melena, I do feel he needs evaluation with at least an upper endoscopy.  He denies ever having a colonoscopy.  Iron panel not entirely consistent with iron deficiency anemia.  I will update iron panel at this time.  Could consider colonoscopy at the time of EGD if iron deficiency is present.  Earliest procedure date would likely be 12/31 due to last dose of Eliquis the evening of 12/28 providing patient is clinically stable from a cardiac and respiratory standpoint. To discuss further with Dr.  Oneida Alar.  Dysphagia: Reports 3 month history of dysphagia typically to solid foods.  Also dysphagia to liquids if drinking fast.  Reports chronic history of GERD that seems to be fairly well controlled on omeprazole 40 mg daily.  Suspect patient may have esophageal web, ring, stricture.  Cannot rule out malignancy.  He will need EGD for anemia with melena as described above.  Would be able to perform dilation as appropriate at that time.  Elevated Alk phos/elevated bilirubin: Alk phos elevated at 171 and total bilirubin elevated at 1.4.  AST and ALT normal.  Patient has cholecystostomy tube in place.  No drain bag attached at this time.  Per patient, this was removed 1-2 weeks ago.  He was supposed to return to [facility] today.  He denies any abdominal pain. Last alk phos 80 in October 2020.  As I was able to locate a second MRN for this patient with access to outside records, I will need to review these further.  At this time, as patient was without any abdominal pain, we will plan to repeat HFP tomorrow.  He is going to need follow-up with IR.   Plan: Check Iron  panel Continue IV Protonix 40 mg twice daily. Transfuse as needed. To discuss role of EGD versus EGD/TCS with Dr. Oneida Alar.  Earliest date would likely be 12/31 due to last dose of Eliquis on the evening of 12/28. Continue to monitor H/H Avoid NSAIDs moving forward.  HFP in the morning.  Review outside records further regarding cholecystostomy tube.    Addendum: Reviewed patients chart under second MRN [id number]. Acute cholecystitis on 9/21 with cholecystostomy placed 9/23 and replaced 01/19/2019 after falling out. Exchanged on 02/23/2019 (8 Fr APDL) noting cholelithiasis with patent cystic duct. Most recently exchanged on 04/06/19 with upsize of 8 Fr choly tube for 10 Fr choly tube. Cholecystostogram demonstrates spontaneous of contrast through the biliary system into the duodenum. The gallbladder is contracted. There are no definitive filling  defects in the cystic duct suggestive ot stones. PLAN: The tube will be capped today. The patient will be scheduled for a clinic visit with Milford Cage in two weeks to discuss possible removal of the tube and/or choledochoscopy. Discharge  Instructions stated: "If you have any pain, drainage, or fever attach drainage bag to catheter and call IR at [phone] or [phone]."  Last saw general surgery on 03/30/19 who stated patient is high risk for surgery and currently the risks of surgery far outweigh the benefits of surgery. Plans to follow-up in April 2021.      LOS: 0 days    04/13/2019, 12:11 PM   Aliene Altes, [facility]

## 2019-04-13 NOTE — Consult Note (Addendum)
Cardiology Consult    Patient ID: Matthew Campos; ED:[id number]; July 02, 1940   Admit date: 04/13/2019 Date of Consult: 04/13/2019  Primary Care Provider: System, Pcp Not In Primary Cardiologist: Dr. Clovia Cuff [facility]  Patient Profile    Matthew Campos is a 78 y.o. male with past medical history of CAD (s/p prior stenting of LAD and RCA, catheterization in 11/2018 showing patent stents and 30% OM3 stenosis, severe AS (s/p TAVR in 12/2018 with 26 mm Sapien valve), known sinus bradycardia, HTN, HLD, AAA (s/p endovascular repair in 04/11), chronic cholecystitis (s/p tube placement) and COPD who is being seen today for the evaluation of chest pain at the request of Dr. Wynetta Emery.    History of Present Illness    Matthew Campos is followed by [facility] and underwent TAVR in 12/2018. His last visit was in 02/2019 and follow-up echo showed trace perivavular regurgitation. He was found to be in atrial fibrillation at the time of his evaluation and a Zio monitor was ordered to assess AF burden and Plavix was discontinued with Eliquis being initiated.   He presented to [facility] ED this AM for evaluation of chest pain. He reports being in his usual state of health until this morning when he developed acute chest discomfort around 0230.  He says he was consuming water at the time and developed an aching sensation along his entire pectoral region. He denies any associated dyspnea, nausea, radiating pain, or diaphoresis. He does report having baseline dyspnea on exertion but denies any acute changes in his breathing. Says that his weight has actually declined by 10 pounds on his home scales and is now at 214 lbs but says his appetite has declined. He does have baseline lower extremity edema and reports compliance with his diuretic regimen. By review of medications, he was on Torsemide 40 mg in AM/20 mg in PM prior to admission  Initial labs show WBC 7.5, Hgb 7.6, platelets 154, Na+ 138, K+  3.7 and creatinine 2.21 (previously 1.46 in 01/2019). Occult blood positive. Initial HS Troponin 20 with repeat of 19. COVID pending. CXR shows cardiomegaly with vascular congestion concerning for pulmonary edema and small bilateral pleureal effusions. EKG shows atrial fibrillation with slow-ventricular response, HR 46 and RAD.   Telemetry shows atrial fibrillation with variable rates from the high-30's to 70's. He does have occasional pauses up to 2.14 seconds.   Past Medical History:  Diagnosis Date  . Abdominal aortic aneurysm without rupture ([place])   . Anemia   . Anxiety and depression   . 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[place])   . CHF (congestive heart failure) ([place])   . Cholecystitis   . Chronic ischemic heart disease   . COPD (chronic obstructive pulmonary disease) ([place])   . Coronary artery disease   . Diabetes mellitus without complication ([place])   . GERD (gastroesophageal reflux disease)   . Hypertension   . Neoplasm of uncertain behavior of bladder   . Obesity   . OSA (obstructive sleep apnea)   . Pancytopenia ([place])   . Paroxysmal atrial fibrillation ([facility])   . S/P TAVR (transcatheter aortic valve replacement)   . Severe aortic stenosis   . Sinus bradycardia     Past Surgical History:  Procedure Laterality Date  . ABDOMINAL AORTIC ANEURYSM REPAIR     per patient stents placed about 4-5 years ago  . AORTIC VALVE REPLACEMENT    . cholecystostomy tube    . CORONARY ANGIOPLASTY WITH STENT PLACEMENT  about 30 years ago per patient  . TONSILLECTOMY       Home Medications:  Prior to Admission medications   Medication Sig Start Date End Date Taking? Authorizing Provider  albuterol (PROVENTIL) (2.5 MG/3ML) 0.083% nebulizer solution Take 2.5 mg by nebulization every 6 (six) hours as needed for wheezing or shortness of breath.   Yes [provider]  albuterol (VENTOLIN HFA) 108 (90 Base) MCG/ACT inhaler Inhale 2 puffs into the lungs every 6 (six) hours as needed. For  shortness of breath. 11/09/18  Yes [provider]  aspirin EC 81 MG tablet Take 81 mg by mouth daily.   Yes [provider]  CALCIUM ANTACID 500 MG chewable tablet Chew 1 tablet by mouth 3 (three) times daily as needed. 02/02/19  Yes [provider]  ELIQUIS 5 MG TABS tablet Take 5 mg by mouth 2 (two) times daily. 03/23/19  Yes [provider]  escitalopram (LEXAPRO) 10 MG tablet Take 10 mg by mouth daily. 03/15/19  Yes [provider]  LORazepam (ATIVAN) 0.5 MG tablet Take 1 tablet by mouth at bedtime as needed. 02/01/19  Yes [provider]  losartan (COZAAR) 100 MG tablet Take 1 tablet by mouth daily. 02/26/19  Yes [provider]  metoprolol tartrate (LOPRESSOR) 50 MG tablet Take 50 mg by mouth 2 (two) times daily.   Yes [provider]  Multiple Vitamin (MULTIVITAMIN) capsule Take 1 capsule by mouth daily.   Yes [provider]  nitroGLYCERIN (NITROSTAT) 0.4 MG SL tablet Place 0.4 mg under the tongue every 5 (five) minutes as needed for chest pain.   Yes [provider]  omeprazole (PRILOSEC) 40 MG capsule Take 1 capsule by mouth 2 (two) times daily. 02/08/19  Yes [provider]  ondansetron (ZOFRAN-ODT) 4 MG disintegrating tablet Take 1 tablet by mouth daily as needed. 03/15/19  Yes [provider]  Propylene Glycol (SYSTANE BALANCE) 0.6 % SOLN Apply 1 drop to eye daily as needed (1 GTT OU QD PRN FOR ALLERGIES.).   Yes [provider]  rosuvastatin (CRESTOR) 10 MG tablet Take 10 mg by mouth daily. 02/26/19  Yes [provider]  sitaGLIPtin (JANUVIA) 25 MG tablet Take 25 mg by mouth 2 (two) times daily.    Yes [provider]  SYMBICORT 80-4.5 MCG/ACT inhaler Inhale 2 puffs into the lungs 2 (two) times daily as needed. 10/28/18  Yes [provider]  torsemide (DEMADEX) 20 MG tablet Take 1 tablet by mouth 3 (three) times daily. TAKE 2 TABS PO EACH AM, 1  TAB PO EACH PM. 03/26/19  Yes [provider]    Inpatient Medications: Scheduled Meds: . pantoprazole (PROTONIX) IV  40 mg Intravenous Q12H   Continuous Infusions:  PRN Meds:   Allergies:    Allergies  Allergen Reactions  . Codeine   . Lisinopril     Social History:   Social History   Socioeconomic History  . Marital status: Married    Spouse name: Not on file  . Number of children: Not on file  . Years of education: Not on file  . Highest education level: Not on file  Occupational History  . Not on file  Tobacco Use  . Smoking status: Former Research scientist (life sciences)  . Smokeless tobacco: Never Used  Substance and Sexual Activity  . Alcohol use: Not Currently    Comment: last drank about 2-3 years ago  . Drug use: Not Currently  . Sexual activity: Not Currently  Other Topics  Concern  . Not on file  Social History Narrative  . Not on file   Social Determinants of Health   Financial Resource Strain:   . Difficulty of Paying Living Expenses: Not on file  Food Insecurity:   . Worried About Charity fundraiser in the Last Year: Not on file  . Ran Out of Food in the Last Year: Not on file  Transportation Needs:   . Lack of Transportation (Medical): Not on file  . Lack of Transportation (Non-Medical): Not on file  Physical Activity:   . Days of Exercise per Week: Not on file  . Minutes of Exercise per Session: Not on file  Stress:   . Feeling of Stress : Not on file  Social Connections:   . Frequency of Communication with Friends and Family: Not on file  . Frequency of Social Gatherings with Friends and Family: Not on file  . Attends Religious Services: Not on file  . Active Member of Clubs or Organizations: Not on file  . Attends Archivist Meetings: Not on file  . Marital Status: Not on file  Intimate Partner Violence:   . Fear of Current or Ex-Partner: Not on file  . Emotionally Abused: Not on file  . Physically Abused: Not on file  . Sexually  Abused: Not on file     Family History:    Family History  Problem Relation Age of Onset  . Colon cancer Mother        in her 37s.   . Stomach cancer Neg Hx   . Esophageal cancer Neg Hx       Review of Systems    General:  No chills, fever, night sweats or weight changes.  Cardiovascular:  No dyspnea on exertion, edema, orthopnea, palpitations, paroxysmal nocturnal dyspnea. Positive for chest pain.  Dermatological: No rash, lesions/masses Respiratory: No cough, dyspnea Urologic: No hematuria, dysuria Abdominal:   No nausea, vomiting, diarrhea, bright red blood per rectum, melena, or hematemesis Neurologic:  No visual changes, wkns, changes in mental status. All other systems reviewed and are otherwise negative except as noted above.  Physical Exam/Data    Vitals:   04/13/19 0830 04/13/19 0959 04/13/19 1000 04/13/19 1030  BP: (!) 98/49 129/70 (!) 111/56 110/89  Pulse: (!) 49 60 75 (!) 107  Resp: 12 (!) 23 17 17   Temp:      TempSrc:      SpO2: 96% 96% 95% (!) 84%  Weight:      Height:       No intake or output data in the 24 hours ending 04/13/19 1134 Filed Weights   04/13/19 0425  Weight: 97.1 kg   Body mass index is 32.54 kg/m.   General: Pleasant, Caucasian male appearing in NAD Psych: Normal affect. Neuro: Alert and oriented X 3. Moves all extremities spontaneously. HEENT: Normal  Neck: Supple without bruits or JVD. Lungs:  Resp regular and unlabored, decreased along bases with rales noted. Heart: Irregularly irregular. no s3, s4, or murmurs. Abdomen: Soft, non-tender, non-distended, BS + x 4.  Extremities: No clubbing or cyanosis. 2+ pitting edema bilaterally. DP/PT/Radials 2+ and equal bilaterally.   EKG:  The EKG was personally reviewed and demonstrates: Atrial fibrillation with slow-ventricular response, HR 46 and RAD.    Labs/Studies     Relevant CV Studies:  Cardiac Catheterization: 11/2018 Non-obstructive coronary artery disease.  Discussed  with patient (but may still be drowsy).  Discussed with family.  RLCP for Severe AS  48F IJ for RHC/[place] - advanced to PA/[place]. Numbers as below  5/70F right radial access with micropuncture system.   Using 0.035 wire, the catheter was guided to the ascending aorta.  Selective engagement of the left main with a JL3.5. The LAD has mild  luminal irregs - stent is patent. The LCX has minimal CAD large OM3 with  at worst 30%.   The JL was removed and exchanged over an 0.035 wire for a JR4 catheter.  The JR4 was placed in the right main.   The RCA has stent mid-prox, minimal 25% disease   Anatomy is right dominant  The JR4 was removed over a 0.035 J wire.   EBL <12mL. No specimens. PreludeSync band for hemostasis to radial site.   Echocardiogram: 02/2019 SUMMARY The left ventricular size is normal. There is normal left ventricular wall thickness.  Left ventricular systolic function is normal. LV ejection fraction = 60%. The left ventricular wall motion is normal. The right ventricle is mildly dilated. The right ventricular systolic function is normal. s/p 24hr TAVR / 58mm Sapien with trace perivalvular regurgitation. The peak velocity across the aortic valve is 3.2 m/sec with a dimensionless  index of 0.4. There is mild mitral regurgitation. Mild pulmonic valvular regurgitation. The aortic sinus is normal size. IVC size was mildly dilated. There is no pericardial effusion. Compared to prior study, there has been a slight increase in velocity across  the aortic valve.  Laboratory Data:  Chemistry Recent Labs  Lab 04/21/19 0500  NA 138  K 3.7  CL 94*  CO2 31  GLUCOSE 183*  BUN 42*  CREATININE 2.21*  CALCIUM 8.6*  GFRNONAA 28*  GFRAA 32*  ANIONGAP 13    Recent Labs  Lab 04-21-2019 0500  PROT 6.8  ALBUMIN 3.3*  AST 37  ALT 18  ALKPHOS 171*  BILITOT 1.4*   Hematology Recent Labs  Lab 04-21-2019 0500  WBC 7.5  RBC 2.64*  HGB 7.6*  HCT 26.0*  MCV  98.5  MCH 28.8  MCHC 29.2*  RDW 16.6*  PLT 154   Cardiac EnzymesNo results for input(s): TROPONINI in the last 168 hours. No results for input(s): TROPIPOC in the last 168 hours.  BNPNo results for input(s): BNP, PROBNP in the last 168 hours.  DDimer No results for input(s): DDIMER in the last 168 hours.  Radiology/Studies:  US Renal  Result Date: 04/21/19 CLINICAL DATA:  Acute on chronic kidney injury. EXAM: RENAL / URINARY TRACT ULTRASOUND COMPLETE COMPARISON:  None FINDINGS: Right Kidney: Renal measurements: 10.5 x 5.0 x 5.2 cm = volume: 141.0 mL . Echogenicity within normal limits. No mass or hydronephrosis visualized. Left Kidney: Renal measurements: 11.7 by 5.3 x 6.0 cm = volume: 194.1 mL. Echogenicity within normal limits. No mass or hydronephrosis visualized. Bladder: Not visualized Other: Mild to moderate volume of ascites identified within the right lower quadrant and left lower quadrant of the abdomen. IMPRESSION: Normal appearance of the kidneys.  No hydronephrosis or mass. Mild to moderate abdominal ascites Electronically Signed   By: Kerby Moors M.D.   On: April 21, 2019 10:54   DG Chest Port 1 View  Result Date: 04/21/2019 CLINICAL DATA:  Awoke from sleep with chest pain. EXAM: PORTABLE CHEST 1 VIEW COMPARISON:  None. FINDINGS: The heart is enlarged. Prosthetic aortic valve. Vascular congestion with perivascular haziness. Suspected small bilateral pleural effusions. No pneumothorax. No acute osseous abnormalities are seen. Symmetric widening of the acromioclavicular joints. IMPRESSION: Cardiomegaly with vascular congestion and perivascular haziness, suspicious  for pulmonary edema. Suspected small bilateral pleural effusions. Overall findings suspicious for CHF. Electronically Signed   By: Keith Rake M.D.   On: 04/13/2019 04:58     Assessment & Plan    1. Chest Pain with Atypical Features - He developed new onset chest discomfort this morning which occurred while  consuming water. Took SL NTG with some improvement but symptoms persisted. No associated dyspnea, nausea, vomiting or diaphoresis. He denies any recurrent pain since arrival to the ED.  - Initial and delta troponin values have been flat at 19 and 20 and EKG showing atrial fibrillation with slow ventricular response but no acute ST changes. He has been found to have anemia along with an AKI. - Overall, his chest pain seems atypical for a cardiac etiology and recent catheterization in 11/2018 showed patent stents with nonobstructive CAD. Given his AKI and anemia, would not anticipate further ischemic evaluation this admission.  2. CAD - he is s/p prior stenting of LAD and RCA with recent catheterization in 11/2018 showing patent stents and 30% OM3 stenosis. He has ruled out for ACS as outlined above and EKG shows no acute ischemic changes.  - ASA currently held given his anemia and positive occult blood. Restart statin once able to take PO medications. Not on BB given baseline bradycardia.   3. Severe AS - s/p TAVR in 12/2018 with 26 mm Sapien valve. Most recent echo showed trace perivalvular regurgitation with peak velocity across the aortic valve being 3.2 m/sec with a dimensionless index of 0.4. Followed by [facility].   4. Chronic Diastolic CHF - He does have 2+ pitting edema on examination and CXR was consistent with CHF. By review of records he was on Torsemide 40 mg in AM/20 mg in PM prior to admission. Would recommend starting IV Lasix 40mg  BID and assessing response. Follow I&O's along with daily weights. Creatinine is elevated to 2.21 and was 1.46 in 01/2019 so will need daily BMET's.    5. Atrial Fibrillation - This was a new diagnosis for the patient at the time of his follow-up in 02/2019. He denies any recent palpitations and is in atrial fibrillation by review of telemetry with rates variable from the high 30's to 70's. He does have pauses up to 2.14 seconds. By review of his  merged chart, he was not on AV nodal blocking agents. A Zio Monitor had been ordered but results are not available for review. Would continue to avoid the use of AV nodal blocking agents given his baseline bradycardia.  6. Anemia/GIB - Hgb at 7.6 on admission and Occult Blood positive. GI consult has been requested by the admitting team.    For questions or updates, please contact [place] Please consult [email or web address] for contact info under Cardiology/STEMI.  Signed, Erma Heritage, PA-C 04/13/2019, 11:34 AM Pager: [phone]  Attending note Patient seen and discussed with PA Matthew Campos, I agree with her documentation above. 78 yo male history of aortic stenosis s/p TAVR, chronic cholecystitis with cholecystostomy tube in place, chronic anemia, afib on eliquis, DM2, VKD 3, admitted with chest pain.  In ER found to be anemic Hgb 7.6, Heme +stool.    Please see extensive cardiac history and recent cardiac testing done at [facility] as reported above  K 3.7 BUN 42 Cr 2.21 WBC 7.5 Hgb 7.6 Plt 154  hstrop 20-->19 FOBT + CXR cardiomegaly, vascular congestion EKG afib, slow ventricular resposne  Patient presents with atypical chest pain. Trop is flat and nonspecific.  Recent cath 11/2018 at [facility] with patent coronaries. In the absence of objective evidence of ischemia would not plan for repeat ischemic testing. Agree with GI evaluation given pain, anemia and heme + stools. He had an echo just last month at [facility] with normal functioning AVR, normal LVEF. Suspect his LE edema is related to some diastolic dysfunction, agree with IV lasix today 40mg  bid. Hold ASA and eliquis in setting of GI bleed.    Carlyle Dolly MD

## 2019-04-13 NOTE — ED Provider Notes (Signed)
[facility] EMERGENCY DEPARTMENT Provider Note   CSN: [id number] Arrival date & time: 04/13/19  [id number]   History Chief Complaint  Patient presents with  . Chest Pain    Matthew Campos is a 78 y.o. male.  The history is provided by the patient.  Chest Pain He has history of hypertension, diabetes, coronary artery disease status post stent placement and comes in because of an episode chest pain.  Pain came on at about 2:30 AM.  He had awakened and he drank some water with some flavored powder in it and developed pain across his lower chest without radiation.  Pain was severe and he rated it at 10/10.  Pain is dull.  Nothing made it better, nothing made it worse.  He states it felt similar to what his heart attack and felt like.  There is no associated dyspnea, nausea, diaphoresis.  Pain lasted about 1 hour and then resolved.  He is pain-free currently.  Of note, he does currently have a gallbladder drainage tube in place.  He does bring some discharge paperwork from [facility] stating that he has a cholecystostomy tube, status post TAVR, history of sinus bradycardia, history of paroxysmal atrial fibrillation anticoagulated on apixaban.  Past Medical History:  Diagnosis Date  . Abdominal aortic aneurysm without rupture ([place])   . Anemia   . Anxiety and depression   . 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[place])   . CHF (congestive heart failure) ([place])   . Cholecystitis   . Chronic ischemic heart disease   . COPD (chronic obstructive pulmonary disease) ([place])   . Coronary artery disease   . Diabetes mellitus without complication ([place])   . GERD (gastroesophageal reflux disease)   . Hypertension   . Neoplasm of uncertain behavior of bladder   . Obesity   . OSA (obstructive sleep apnea)   . Pancytopenia ([place])   . Paroxysmal atrial fibrillation (HCC)   . S/P TAVR (transcatheter aortic valve replacement)   . Severe aortic stenosis   . Sinus bradycardia     Patient Active Problem  List   Diagnosis Date Noted  . UGI bleed 04/13/2019    Past Surgical History:  Procedure Laterality Date  . AORTIC VALVE REPLACEMENT    . TONSILLECTOMY         History reviewed. No pertinent family history.  Social History   Tobacco Use  . Smoking status: Former Research scientist (life sciences)  . Smokeless tobacco: Never Used  Substance Use Topics  . Alcohol use: Not Currently  . Drug use: Not Currently    Home Medications Prior to Admission medications   Not on File    Allergies    Codeine and Lisinopril  Review of Systems   Review of Systems  Cardiovascular: Positive for chest pain.  All other systems reviewed and are negative.   Physical Exam Updated Vital Signs BP (!) 95/51   Pulse (!) 101   Temp 97.9 F (36.6 C) (Oral)   Resp 15   Ht 5\' 8"  (1.727 m)   Wt 97.1 kg   SpO2 94%   BMI 32.54 kg/m   Physical Exam Vitals and nursing note reviewed.   78 year old male, resting comfortably and in no acute distress. Vital signs are significant for slow heart rate. Oxygen saturation is 100%, which is normal. Head is normocephalic and atraumatic. PERRLA, EOMI. Oropharynx is clear. Neck is nontender and supple without adenopathy or JVD. Back is nontender and there is no CVA tenderness.  There is  2+ presacral edema. Lungs are clear without rales, wheezes, or rhonchi. Chest is nontender. Heart has regular rate and rhythm without murmur. Abdomen is soft, somewhat distended, with mild to moderate right upper quadrant tenderness.  There is no rebound or guarding.  There are no masses or hepatosplenomegaly and peristalsis is normoactive. Extremities have 2-3+ edema, full range of motion is present. Skin is warm and dry without rash. Neurologic: Mental status is normal, cranial nerves are intact, there are no motor or sensory deficits.  ED Results / Procedures / Treatments   Labs (all labs ordered are listed, but only abnormal results are displayed) Labs Reviewed  COMPREHENSIVE METABOLIC  PANEL - Abnormal; Notable for the following components:      Result Value   Chloride 94 (*)    Glucose, Bld 183 (*)    BUN 42 (*)    Creatinine, Ser 2.21 (*)    Calcium 8.6 (*)    Albumin 3.3 (*)    Alkaline Phosphatase 171 (*)    Total Bilirubin 1.4 (*)    GFR calc non Af Amer 28 (*)    GFR calc Af Amer 32 (*)    All other components within normal limits  CBC WITH DIFFERENTIAL/PLATELET - Abnormal; Notable for the following components:   RBC 2.64 (*)    Hemoglobin 7.6 (*)    HCT 26.0 (*)    MCHC 29.2 (*)    RDW 16.6 (*)    Lymphs Abs 0.4 (*)    All other components within normal limits  POC OCCULT BLOOD, ED - Abnormal; Notable for the following components:   Fecal Occult Bld POSITIVE (*)    All other components within normal limits  TROPONIN I (HIGH SENSITIVITY) - Abnormal; Notable for the following components:   Troponin I (High Sensitivity) 20 (*)    All other components within normal limits  SARS CORONAVIRUS 2 (TAT 6-24 HRS)  LIPASE, BLOOD  TYPE AND SCREEN  PREPARE RBC (CROSSMATCH)  TROPONIN I (HIGH SENSITIVITY)    EKG EKG Interpretation  Date/Time:  Tuesday April 13 2019 04:27:11 EST Ventricular Rate:  46 PR Interval:    QRS Duration: 111 QT Interval:  524 QTC Calculation: 459 R Axis:   95 Text Interpretation: Atrial fibrillation Right axis deviation Low voltage, extremity leads No old tracing to compare Confirmed by Delora Fuel ([id number]) on 04/13/2019 4:38:01 AM   Radiology DG Chest Port 1 View  Result Date: 04/13/2019 CLINICAL DATA:  Awoke from sleep with chest pain. EXAM: PORTABLE CHEST 1 VIEW COMPARISON:  None. FINDINGS: The heart is enlarged. Prosthetic aortic valve. Vascular congestion with perivascular haziness. Suspected small bilateral pleural effusions. No pneumothorax. No acute osseous abnormalities are seen. Symmetric widening of the acromioclavicular joints. IMPRESSION: Cardiomegaly with vascular congestion and perivascular haziness, suspicious  for pulmonary edema. Suspected small bilateral pleural effusions. Overall findings suspicious for CHF. Electronically Signed   By: Keith Rake M.D.   On: 04/13/2019 04:58    Procedures Procedures  CRITICAL CARE Performed by: Delora Fuel Total critical care time: 50 minutes Critical care time was exclusive of separately billable procedures and treating other patients. Critical care was necessary to treat or prevent imminent or life-threatening deterioration. Critical care was time spent personally by me on the following activities: development of treatment plan with patient and/or surrogate as well as nursing, discussions with consultants, evaluation of patient's response to treatment, examination of patient, obtaining history from patient or surrogate, ordering and performing treatments and interventions, ordering and review of  laboratory studies, ordering and review of radiographic studies, pulse oximetry and re-evaluation of patient's condition.  Medications Ordered in ED Medications  0.9 %  sodium chloride infusion (has no administration in time range)  pantoprazole (PROTONIX) injection 40 mg (has no administration in time range)  sodium chloride 0.9 % bolus 1,000 mL (1,000 mLs Intravenous New Bag/Given 04/13/19 [id number])    ED Course  I have reviewed the triage vital signs and the nursing notes.  Pertinent labs & imaging results that were available during my care of the patient were reviewed by me and considered in my medical decision making (see chart for details).  MDM Rules/Calculators/A&P Pain in the upper abdomen and lower chest of uncertain cause.  Patient does have known history of coronary artery disease but is currently pain-free.  ECG shows probable atrial fibrillation with a slow ventricular response but no ST or T changes.  Low QRS voltage noted.  He has no prior records in the [facility] health system.  Will check screening labs including troponin, liver enzymes, lipase.  Labs  have come back with hemoglobin of 7.6, creatinine 2.21, bilirubin 1.4.  Blood pressure has dropped to 82/51.  Unfortunately, I am still unable to access his records at [facility]  However, on further questioning, he does admit to having black stools.  Stool Hemoccult was done which is positive.  He is given IV fluids and blood is sent for type and screen and he will be given transfusion.  This does not seem to be an aggressive bleed, as I do not feel that he needs the rapid reversal of anticoagulant protocol.  Case is discussed with Dr. Olevia Bowens of [facility], who agrees to admit the patient.  Final Clinical Impression(s) / ED Diagnoses Final diagnoses:  Gastrointestinal hemorrhage, unspecified gastrointestinal hemorrhage type  Chronic anticoagulation  Chest pain, unspecified type  Anemia due to blood loss  Renal insufficiency  Bradycardia  Serum total bilirubin elevated    Rx / DC Orders ED Discharge Orders    None       Delora Fuel, MD [id number] [phone]

## 2019-04-14 DIAGNOSIS — F329 Major depressive disorder, single episode, unspecified: Secondary | ICD-10-CM | POA: Diagnosis present

## 2019-04-14 DIAGNOSIS — R079 Chest pain, unspecified: Secondary | ICD-10-CM | POA: Diagnosis not present

## 2019-04-14 DIAGNOSIS — Z20822 Contact with and (suspected) exposure to covid-19: Secondary | ICD-10-CM | POA: Diagnosis not present

## 2019-04-14 DIAGNOSIS — I509 Heart failure, unspecified: Secondary | ICD-10-CM | POA: Diagnosis not present

## 2019-04-14 DIAGNOSIS — K922 Gastrointestinal hemorrhage, unspecified: Secondary | ICD-10-CM | POA: Diagnosis not present

## 2019-04-14 DIAGNOSIS — J449 Chronic obstructive pulmonary disease, unspecified: Secondary | ICD-10-CM | POA: Diagnosis not present

## 2019-04-14 DIAGNOSIS — I480 Paroxysmal atrial fibrillation: Secondary | ICD-10-CM | POA: Diagnosis not present

## 2019-04-14 DIAGNOSIS — I4891 Unspecified atrial fibrillation: Secondary | ICD-10-CM | POA: Diagnosis not present

## 2019-04-14 DIAGNOSIS — I5032 Chronic diastolic (congestive) heart failure: Secondary | ICD-10-CM

## 2019-04-14 DIAGNOSIS — K219 Gastro-esophageal reflux disease without esophagitis: Secondary | ICD-10-CM | POA: Diagnosis present

## 2019-04-14 DIAGNOSIS — R188 Other ascites: Secondary | ICD-10-CM | POA: Diagnosis not present

## 2019-04-14 DIAGNOSIS — R001 Bradycardia, unspecified: Secondary | ICD-10-CM

## 2019-04-14 DIAGNOSIS — D131 Benign neoplasm of stomach: Secondary | ICD-10-CM | POA: Diagnosis not present

## 2019-04-14 DIAGNOSIS — I350 Nonrheumatic aortic (valve) stenosis: Secondary | ICD-10-CM | POA: Diagnosis not present

## 2019-04-14 DIAGNOSIS — N183 Chronic kidney disease, stage 3 unspecified: Secondary | ICD-10-CM | POA: Diagnosis not present

## 2019-04-14 DIAGNOSIS — K921 Melena: Secondary | ICD-10-CM | POA: Diagnosis not present

## 2019-04-14 DIAGNOSIS — I130 Hypertensive heart and chronic kidney disease with heart failure and stage 1 through stage 4 chronic kidney disease, or unspecified chronic kidney disease: Secondary | ICD-10-CM | POA: Diagnosis not present

## 2019-04-14 DIAGNOSIS — I5033 Acute on chronic diastolic (congestive) heart failure: Secondary | ICD-10-CM | POA: Diagnosis not present

## 2019-04-14 DIAGNOSIS — N289 Disorder of kidney and ureter, unspecified: Secondary | ICD-10-CM

## 2019-04-14 DIAGNOSIS — I110 Hypertensive heart disease with heart failure: Secondary | ICD-10-CM | POA: Diagnosis not present

## 2019-04-14 DIAGNOSIS — R0902 Hypoxemia: Secondary | ICD-10-CM | POA: Diagnosis not present

## 2019-04-14 DIAGNOSIS — N179 Acute kidney failure, unspecified: Secondary | ICD-10-CM

## 2019-04-14 DIAGNOSIS — Z434 Encounter for attention to other artificial openings of digestive tract: Secondary | ICD-10-CM | POA: Diagnosis not present

## 2019-04-14 DIAGNOSIS — E119 Type 2 diabetes mellitus without complications: Secondary | ICD-10-CM | POA: Diagnosis present

## 2019-04-14 DIAGNOSIS — F419 Anxiety disorder, unspecified: Secondary | ICD-10-CM | POA: Diagnosis present

## 2019-04-14 DIAGNOSIS — K2980 Duodenitis without bleeding: Secondary | ICD-10-CM | POA: Diagnosis not present

## 2019-04-14 DIAGNOSIS — D62 Acute posthemorrhagic anemia: Secondary | ICD-10-CM | POA: Diagnosis not present

## 2019-04-14 DIAGNOSIS — K819 Cholecystitis, unspecified: Secondary | ICD-10-CM | POA: Diagnosis not present

## 2019-04-14 DIAGNOSIS — L03311 Cellulitis of abdominal wall: Secondary | ICD-10-CM | POA: Diagnosis not present

## 2019-04-14 DIAGNOSIS — K828 Other specified diseases of gallbladder: Secondary | ICD-10-CM | POA: Diagnosis not present

## 2019-04-14 DIAGNOSIS — K811 Chronic cholecystitis: Secondary | ICD-10-CM | POA: Diagnosis not present

## 2019-04-14 DIAGNOSIS — G4733 Obstructive sleep apnea (adult) (pediatric): Secondary | ICD-10-CM | POA: Diagnosis not present

## 2019-04-14 DIAGNOSIS — R531 Weakness: Secondary | ICD-10-CM | POA: Diagnosis not present

## 2019-04-14 DIAGNOSIS — K317 Polyp of stomach and duodenum: Secondary | ICD-10-CM | POA: Diagnosis not present

## 2019-04-14 DIAGNOSIS — R0789 Other chest pain: Secondary | ICD-10-CM | POA: Diagnosis not present

## 2019-04-14 DIAGNOSIS — D631 Anemia in chronic kidney disease: Secondary | ICD-10-CM | POA: Diagnosis present

## 2019-04-14 DIAGNOSIS — E1122 Type 2 diabetes mellitus with diabetic chronic kidney disease: Secondary | ICD-10-CM | POA: Diagnosis present

## 2019-04-14 DIAGNOSIS — D500 Iron deficiency anemia secondary to blood loss (chronic): Secondary | ICD-10-CM

## 2019-04-14 DIAGNOSIS — R1310 Dysphagia, unspecified: Secondary | ICD-10-CM | POA: Diagnosis present

## 2019-04-14 DIAGNOSIS — D689 Coagulation defect, unspecified: Secondary | ICD-10-CM | POA: Diagnosis not present

## 2019-04-14 DIAGNOSIS — I2510 Atherosclerotic heart disease of native coronary artery without angina pectoris: Secondary | ICD-10-CM | POA: Diagnosis not present

## 2019-04-14 DIAGNOSIS — K2970 Gastritis, unspecified, without bleeding: Secondary | ICD-10-CM | POA: Diagnosis not present

## 2019-04-14 DIAGNOSIS — Z952 Presence of prosthetic heart valve: Secondary | ICD-10-CM | POA: Diagnosis not present

## 2019-04-14 DIAGNOSIS — Z7901 Long term (current) use of anticoagulants: Secondary | ICD-10-CM | POA: Diagnosis not present

## 2019-04-14 DIAGNOSIS — I259 Chronic ischemic heart disease, unspecified: Secondary | ICD-10-CM | POA: Diagnosis not present

## 2019-04-14 DIAGNOSIS — D649 Anemia, unspecified: Secondary | ICD-10-CM | POA: Diagnosis not present

## 2019-04-14 LAB — TYPE AND SCREEN
ABO/RH(D): O POS
Antibody Screen: NEGATIVE
Unit division: 0
Unit division: 0

## 2019-04-14 LAB — HEMOGLOBIN AND HEMATOCRIT, BLOOD
HCT: 31.2 % — ABNORMAL LOW (ref 39.0–52.0)
Hemoglobin: 9.5 g/dL — ABNORMAL LOW (ref 13.0–17.0)

## 2019-04-14 LAB — COMPREHENSIVE METABOLIC PANEL
ALT: 35 U/L (ref 0–44)
AST: 39 U/L (ref 15–41)
Albumin: 3.2 g/dL — ABNORMAL LOW (ref 3.5–5.0)
Alkaline Phosphatase: 210 U/L — ABNORMAL HIGH (ref 38–126)
Anion gap: 12 (ref 5–15)
BUN: 42 mg/dL — ABNORMAL HIGH (ref 8–23)
CO2: 29 mmol/L (ref 22–32)
Calcium: 8.4 mg/dL — ABNORMAL LOW (ref 8.9–10.3)
Chloride: 96 mmol/L — ABNORMAL LOW (ref 98–111)
Creatinine, Ser: 2.04 mg/dL — ABNORMAL HIGH (ref 0.61–1.24)
GFR calc Af Amer: 35 mL/min — ABNORMAL LOW (ref >60)
GFR calc non Af Amer: 30 mL/min — ABNORMAL LOW (ref >60)
Glucose, Bld: 190 mg/dL — ABNORMAL HIGH (ref 70–99)
Potassium: 3.9 mmol/L (ref 3.5–5.1)
Sodium: 137 mmol/L (ref 135–145)
Total Bilirubin: 2.9 mg/dL — ABNORMAL HIGH (ref 0.3–1.2)
Total Protein: 6.7 g/dL (ref 6.5–8.1)

## 2019-04-14 LAB — BPAM RBC
Blood Product Expiration Date: 202102022359
Blood Product Expiration Date: 202102022359
ISSUE DATE / TIME: 202012291649
ISSUE DATE / TIME: 202012292007
Unit Type and Rh: 5100
Unit Type and Rh: 5100

## 2019-04-14 LAB — CBC WITH DIFFERENTIAL/PLATELET
Abs Immature Granulocytes: 0.05 10*3/uL (ref 0.00–0.07)
Basophils Absolute: 0 10*3/uL (ref 0.0–0.1)
Basophils Relative: 0 %
Eosinophils Absolute: 0 10*3/uL (ref 0.0–0.5)
Eosinophils Relative: 0 %
HCT: 30.8 % — ABNORMAL LOW (ref 39.0–52.0)
Hemoglobin: 9.4 g/dL — ABNORMAL LOW (ref 13.0–17.0)
Immature Granulocytes: 1 %
Lymphocytes Relative: 6 %
Lymphs Abs: 0.4 10*3/uL — ABNORMAL LOW (ref 0.7–4.0)
MCH: 29.6 pg (ref 26.0–34.0)
MCHC: 30.5 g/dL (ref 30.0–36.0)
MCV: 96.9 fL (ref 80.0–100.0)
Monocytes Absolute: 0.4 10*3/uL (ref 0.1–1.0)
Monocytes Relative: 6 %
Neutro Abs: 6.1 10*3/uL (ref 1.7–7.7)
Neutrophils Relative %: 87 %
Platelets: 130 10*3/uL — ABNORMAL LOW (ref 150–400)
RBC: 3.18 MIL/uL — ABNORMAL LOW (ref 4.22–5.81)
RDW: 16.6 % — ABNORMAL HIGH (ref 11.5–15.5)
WBC: 6.9 10*3/uL (ref 4.0–10.5)
nRBC: 0 % (ref 0.0–0.2)

## 2019-04-14 LAB — TROPONIN I (HIGH SENSITIVITY)
Troponin I (High Sensitivity): 28 ng/L — ABNORMAL HIGH (ref <18)
Troponin I (High Sensitivity): 31 ng/L — ABNORMAL HIGH (ref <18)

## 2019-04-14 LAB — GLUCOSE, CAPILLARY
Glucose-Capillary: 161 mg/dL — ABNORMAL HIGH (ref 70–99)
Glucose-Capillary: 188 mg/dL — ABNORMAL HIGH (ref 70–99)
Glucose-Capillary: 163 mg/dL — ABNORMAL HIGH (ref 70–99)
Glucose-Capillary: 145 mg/dL — ABNORMAL HIGH (ref 70–99)

## 2019-04-14 LAB — MAGNESIUM: Magnesium: 1.9 mg/dL (ref 1.7–2.4)

## 2019-04-14 LAB — T4, FREE: Free T4: 0.53 ng/dL — ABNORMAL LOW (ref 0.61–1.12)

## 2019-04-14 MED ORDER — FUROSEMIDE 10 MG/ML IJ SOLN
40.0000 mg | Freq: Two times a day (BID) | INTRAMUSCULAR | Status: AC
  Administered 2019-04-14 (×2): 40 mg via INTRAVENOUS
  Filled 2019-04-14 (×2): qty 4

## 2019-04-14 NOTE — Progress Notes (Signed)
Progress Note  Patient Name: Matthew Campos Date of Encounter: 04/14/2019  Primary Cardiologist: [facility]  Subjective   No complaints  Inpatient Medications    Scheduled Meds: . Chlorhexidine Gluconate Cloth  6 each Topical Q0600  . citalopram  20 mg Oral Daily  . insulin aspart  0-5 Units Subcutaneous QHS  . insulin aspart  0-9 Units Subcutaneous TID WC  . insulin aspart  3 Units Subcutaneous TID WC  . levothyroxine  150 mcg Oral QAC breakfast  . mouth rinse  15 mL Mouth Rinse BID  . mometasone-formoterol  2 puff Inhalation BID  . multivitamin-lutein  1 capsule Oral Daily  . pantoprazole (PROTONIX) IV  40 mg Intravenous Q12H  . rosuvastatin  10 mg Oral q1800  . sodium chloride flush  3 mL Intravenous Q12H   Continuous Infusions: . sodium chloride     PRN Meds: sodium chloride, acetaminophen **OR** acetaminophen, LORazepam, morphine injection, nitroGLYCERIN, ondansetron **OR** ondansetron (ZOFRAN) IV, sodium chloride flush, traZODone   Vital Signs    Vitals:   04/14/19 0745 04/14/19 0800 04/14/19 0900 04/14/19 1000  BP:  (!) 116/57    Pulse:  63 66 60  Resp:  12 18 13   Temp: 98.9 F (37.2 C)     TempSrc: Oral     SpO2:  93% 90% 96%  Weight:      Height:        Intake/Output Summary (Last 24 hours) at 04/14/2019 1040 Last data filed at 04/14/2019 0500 Gross per 24 hour  Intake 713 ml  Output 500 ml  Net 213 ml   Last 3 Weights 04/14/2019 04/13/2019 04/13/2019  Weight (lbs) 220 lb 7.4 oz 219 lb 5.7 oz 214 lb  Weight (kg) 100 kg 99.5 kg 97.07 kg      Telemetry    Rate controlled afib - Personally Reviewed  ECG    n/a - Personally Reviewed  Physical Exam   GEN: No acute distress.   Neck: No JVD Cardiac: irreg, no murmurs, rubs, or gallops.  Respiratory: Clear to auscultation bilaterally. GI: Soft, nontender, non-distended  MS: 2+ bilateral LE edema Neuro:  Nonfocal  Psych: Normal affect   Labs    High Sensitivity Troponin:    Recent Labs  Lab 04/13/19 0500 04/13/19 0727 04/14/19 0111 04/14/19 0527  TROPONINIHS 20* 19* 28* 31*      Chemistry Recent Labs  Lab 04/13/19 0500 04/14/19 0527  NA 138 137  K 3.7 3.9  CL 94* 96*  CO2 31 29  GLUCOSE 183* 190*  BUN 42* 42*  CREATININE 2.21* 2.04*  CALCIUM 8.6* 8.4*  PROT 6.8 6.7  ALBUMIN 3.3* 3.2*  AST 37 39  ALT 18 35  ALKPHOS 171* 210*  BILITOT 1.4* 2.9*  GFRNONAA 28* 30*  GFRAA 32* 35*  ANIONGAP 13 12     Hematology Recent Labs  Lab 04/13/19 0500 04/14/19 0111 04/14/19 0527  WBC 7.5  --  6.9  RBC 2.64*  --  3.18*  HGB 7.6* 9.5* 9.4*  HCT 26.0* 31.2* 30.8*  MCV 98.5  --  96.9  MCH 28.8  --  29.6  MCHC 29.2*  --  30.5  RDW 16.6*  --  16.6*  PLT 154  --  130*    BNP Recent Labs  Lab 04/13/19 1426  BNP 657.0*     DDimer No results for input(s): DDIMER in the last 168 hours.   Radiology    US Renal  Result Date: 04/13/2019 CLINICAL  DATA:  Acute on chronic kidney injury. EXAM: RENAL / URINARY TRACT ULTRASOUND COMPLETE COMPARISON:  None FINDINGS: Right Kidney: Renal measurements: 10.5 x 5.0 x 5.2 cm = volume: 141.0 mL . Echogenicity within normal limits. No mass or hydronephrosis visualized. Left Kidney: Renal measurements: 11.7 by 5.3 x 6.0 cm = volume: 194.1 mL. Echogenicity within normal limits. No mass or hydronephrosis visualized. Bladder: Not visualized Other: Mild to moderate volume of ascites identified within the right lower quadrant and left lower quadrant of the abdomen. IMPRESSION: Normal appearance of the kidneys.  No hydronephrosis or mass. Mild to moderate abdominal ascites Electronically Signed   By: Kerby Moors M.D.   On: 04/13/2019 10:54   DG Chest Port 1 View  Result Date: 04/13/2019 CLINICAL DATA:  Awoke from sleep with chest pain. EXAM: PORTABLE CHEST 1 VIEW COMPARISON:  None. FINDINGS: The heart is enlarged. Prosthetic aortic valve. Vascular congestion with perivascular haziness. Suspected small bilateral  pleural effusions. No pneumothorax. No acute osseous abnormalities are seen. Symmetric widening of the acromioclavicular joints. IMPRESSION: Cardiomegaly with vascular congestion and perivascular haziness, suspicious for pulmonary edema. Suspected small bilateral pleural effusions. Overall findings suspicious for CHF. Electronically Signed   By: Keith Rake M.D.   On: 04/13/2019 04:58    Cardiac Studies    Patient Profile     DEMONTREZ ARDIS is a 78 y.o. male with past medical history of CAD (s/p prior stenting of LAD and RCA, catheterization in 11/2018 showing patent stents and 30% OM3 stenosis, severe AS (s/p TAVR in 12/2018 with 26 mm Sapien valve), known sinus bradycardia, HTN, HLD, AAA (s/p endovascular repair in 04/11), chronic cholecystitis (s/p tube placement) and COPD who is being seen today for the evaluation of chest pain at the request of Dr. Wynetta Emery.    Assessment & Plan    1. Atypical chest pain/CAD - he is s/p prior stenting of LAD and RCA with recent catheterization in 11/2018 showing patent stents and 30% OM3 stenosis  - mild nonspecific trop elevation - cath 11/2018 at [facility] without obstructive disease - chest pain has resolved this AM.  - no plans for ischemic testing at this time  2. History of aortic stenosis s/p TAVR - normal valve function by echo at [facility] just last month  3. Acute on chronic diasotlic HF - severe LE edema on presentation. CXR with vascular congestion. BNP 657 - 02/2019 echo [facility] normal LVEF, normal AVR - limited uop yesterday per documentation, he received IV lasix 20mg  yesterday  - remains volume overloaded, dose IV lasix 40mg  x 2 today.    4. Afib This was a new diagnosis for the patient at the time of his follow-up in 02/2019. He denies any recent palpitations and is in atrial fibrillation by review of telemetry with rates variable from the high 30's to 70's. He does have pauses up to 2.14 seconds. By review of his merged  chart, he was not on AV nodal blocking agents. A Zio Monitor had been ordered but results are not available for review.  - holding eliquis given GI bleed  5. GI bleed - per GI. Admitted with Hgb 7.6, heme +stool. He reports black stools at home  6. AKI on CKD - labile Cr trends from reviewing care everywhere (he has 2 MRNs at [facility]), ranging from 1.16 to 2.45 - Cr today is 2   For questions or updates, please contact [place] Please consult [email or web address] for contact info under  Merrily Pew, MD  04/14/2019, 10:40 AM

## 2019-04-14 NOTE — Progress Notes (Signed)
  Subjective: No abdominal pain. Breathing feels at baseline. No chest pain. No melena. No BM since admitted. States he has several year history of dysphagia, specifically with tougher textures. Fish causes no problems. Doesn't like to eat chicken. No prior EGD. Tolerating full liquid diet.    Objective: Vital signs in last 24 hours: Temp:  [98.2 F (36.8 C)-98.7 F (37.1 C)] 98.7 F (37.1 C) (12/30 0400) Pulse Rate:  [45-107] 49 (12/30 0500) Resp:  [12-23] 12 (12/30 0500) BP: (85-138)/(44-89) 87/46 (12/30 0500) SpO2:  [90 %-100 %] 96 % (12/30 0500) Weight:  [99.5 kg-100 kg] 100 kg (12/30 0500) Last BM Date: 04/12/19 General:   Alert and oriented, pleasant Head:  Normocephalic and atraumatic. Abdomen:  Obese, round and protuberant. +Bowel sounds. Cholecystostomy tube in place. Patient notes abdomen feels less distended to him than his baseline. Notes chronically protuberant abdomen.  Msk:  Symmetrical without gross deformities. Normal posture. Extremities:  With 2+ pitting lower extremity and pedal edema Neurologic:  Alert and  oriented x4 Psych:  Alert and cooperative. Normal mood and affect.  Intake/Output from previous day: 12/29 0701 - 12/30 0700 In: 713 [I.V.:10.5; Blood:702.5] Out: 500 [Urine:500] Intake/Output this shift: No intake/output data recorded.  Lab Results: Recent Labs    04/13/19 0500 04/14/19 0111 04/14/19 0527  WBC 7.5  --  6.9  HGB 7.6* 9.5* 9.4*  HCT 26.0* 31.2* 30.8*  PLT 154  --  130*   BMET Recent Labs    04/13/19 0500 04/14/19 0527  NA 138 137  K 3.7 3.9  CL 94* 96*  CO2 31 29  GLUCOSE 183* 190*  BUN 42* 42*  CREATININE 2.21* 2.04*  CALCIUM 8.6* 8.4*   LFT Recent Labs    04/13/19 0500 04/14/19 0527  PROT 6.8 6.7  ALBUMIN 3.3* 3.2*  AST 37 39  ALT 18 35  ALKPHOS 171* 210*  BILITOT 1.4* 2.9*     Studies/Results: US Renal  Result Date: 04/13/2019 CLINICAL DATA:  Acute on chronic kidney injury. EXAM: RENAL / URINARY  TRACT ULTRASOUND COMPLETE COMPARISON:  None FINDINGS: Right Kidney: Renal measurements: 10.5 x 5.0 x 5.2 cm = volume: 141.0 mL . Echogenicity within normal limits. No mass or hydronephrosis visualized. Left Kidney: Renal measurements: 11.7 by 5.3 x 6.0 cm = volume: 194.1 mL. Echogenicity within normal limits. No mass or hydronephrosis visualized. Bladder: Not visualized Other: Mild to moderate volume of ascites identified within the right lower quadrant and left lower quadrant of the abdomen. IMPRESSION: Normal appearance of the kidneys.  No hydronephrosis or mass. Mild to moderate abdominal ascites Electronically Signed   By: Taylor  Stroud M.D.   On: 04/13/2019 10:54   DG Chest Port 1 View  Result Date: 04/13/2019 CLINICAL DATA:  Awoke from sleep with chest pain. EXAM: PORTABLE CHEST 1 VIEW COMPARISON:  None. FINDINGS: The heart is enlarged. Prosthetic aortic valve. Vascular congestion with perivascular haziness. Suspected small bilateral pleural effusions. No pneumothorax. No acute osseous abnormalities are seen. Symmetric widening of the acromioclavicular joints. IMPRESSION: Cardiomegaly with vascular congestion and perivascular haziness, suspicious for pulmonary edema. Suspected small bilateral pleural effusions. Overall findings suspicious for CHF. Electronically Signed   By: Melanie  Sanford M.D.   On: 04/13/2019 04:58    Assessment: 78-year-old male presenting with chest pain, acute on chronic anemia, melena, heme positive stool while on Eliquis and currently on hold.   Acute on chronic anemia: 7.6 on admission and now 9.4 s/p 2 units PRBCs. No overt GI bleeding.   No prior EGD or colonoscopy. Anemia likely multifactorial in setting of chronic disease and +/- IDA (ferritin low end normal at 43, iron low at 23, sats low at 5%). Tentative plans for EGD on 12/31 if stable.   Chest pain: cardiology consulted and felt chest pain atypical. Recent cath on file. No further ischemic evaluation this  admission. Denying chest pain currently.   Dysphagia: chronic to solid foods. No prior EGD. +/- dilation at time of EGD.  Ascites on renal US: mild to moderate. Abdomen protuberant; interestingly, patient states he feels his abdomen is less distended than normal. Liver contour has appeared normal on outside images. Chronically intermittent thrombocytopenia. Query congestive hepatopathy playing a role. Consider para prior to discharge.   Chronic cholecystitis and poor surgical candidate: cholecystostomy tube by [facility] and recently exchanged. Alk phos and bilirubin trending up. Needs follow-up with IR at [facility]: EGD +/- dilation on 12/31 if cardiopulmonary status stable.  Continue PPI BID Continue full liquids and will make NPO after midnight in preparation for potential EGD on 12/31 Continue to hold Eliquis Follow CBC, HFP Consider para prior to discharge Needs IR follow-up at [facility] due to cholecystostomy tube    Annitta Needs, PhD, ANP-BC [facility]     LOS: 0 days    04/14/2019, 7:23 AM

## 2019-04-14 NOTE — H&P (View-Only) (Signed)
  Subjective: No abdominal pain. Breathing feels at baseline. No chest pain. No melena. No BM since admitted. States he has several year history of dysphagia, specifically with tougher textures. Fish causes no problems. Doesn't like to eat chicken. No prior EGD. Tolerating full liquid diet.    Objective: Vital signs in last 24 hours: Temp:  [98.2 F (36.8 C)-98.7 F (37.1 C)] 98.7 F (37.1 C) (12/30 0400) Pulse Rate:  [45-107] 49 (12/30 0500) Resp:  [12-23] 12 (12/30 0500) BP: (85-138)/(44-89) 87/46 (12/30 0500) SpO2:  [90 %-100 %] 96 % (12/30 0500) Weight:  [99.5 kg-100 kg] 100 kg (12/30 0500) Last BM Date: 04/12/19 General:   Alert and oriented, pleasant Head:  Normocephalic and atraumatic. Abdomen:  Obese, round and protuberant. +Bowel sounds. Cholecystostomy tube in place. Patient notes abdomen feels less distended to him than his baseline. Notes chronically protuberant abdomen.  Msk:  Symmetrical without gross deformities. Normal posture. Extremities:  With 2+ pitting lower extremity and pedal edema Neurologic:  Alert and  oriented x4 Psych:  Alert and cooperative. Normal mood and affect.  Intake/Output from previous day: 12/29 0701 - 12/30 0700 In: 713 [I.V.:10.5; Blood:702.5] Out: 500 [Urine:500] Intake/Output this shift: No intake/output data recorded.  Lab Results: Recent Labs    04/13/19 0500 04/14/19 0111 04/14/19 0527  WBC 7.5  --  6.9  HGB 7.6* 9.5* 9.4*  HCT 26.0* 31.2* 30.8*  PLT 154  --  130*   BMET Recent Labs    04/13/19 0500 04/14/19 0527  NA 138 137  K 3.7 3.9  CL 94* 96*  CO2 31 29  GLUCOSE 183* 190*  BUN 42* 42*  CREATININE 2.21* 2.04*  CALCIUM 8.6* 8.4*   LFT Recent Labs    04/13/19 0500 04/14/19 0527  PROT 6.8 6.7  ALBUMIN 3.3* 3.2*  AST 37 39  ALT 18 35  ALKPHOS 171* 210*  BILITOT 1.4* 2.9*     Studies/Results: US Renal  Result Date: 04/13/2019 CLINICAL DATA:  Acute on chronic kidney injury. EXAM: RENAL / URINARY  TRACT ULTRASOUND COMPLETE COMPARISON:  None FINDINGS: Right Kidney: Renal measurements: 10.5 x 5.0 x 5.2 cm = volume: 141.0 mL . Echogenicity within normal limits. No mass or hydronephrosis visualized. Left Kidney: Renal measurements: 11.7 by 5.3 x 6.0 cm = volume: 194.1 mL. Echogenicity within normal limits. No mass or hydronephrosis visualized. Bladder: Not visualized Other: Mild to moderate volume of ascites identified within the right lower quadrant and left lower quadrant of the abdomen. IMPRESSION: Normal appearance of the kidneys.  No hydronephrosis or mass. Mild to moderate abdominal ascites Electronically Signed   By: Taylor  Stroud M.D.   On: 04/13/2019 10:54   DG Chest Port 1 View  Result Date: 04/13/2019 CLINICAL DATA:  Awoke from sleep with chest pain. EXAM: PORTABLE CHEST 1 VIEW COMPARISON:  None. FINDINGS: The heart is enlarged. Prosthetic aortic valve. Vascular congestion with perivascular haziness. Suspected small bilateral pleural effusions. No pneumothorax. No acute osseous abnormalities are seen. Symmetric widening of the acromioclavicular joints. IMPRESSION: Cardiomegaly with vascular congestion and perivascular haziness, suspicious for pulmonary edema. Suspected small bilateral pleural effusions. Overall findings suspicious for CHF. Electronically Signed   By: Melanie  Sanford M.D.   On: 04/13/2019 04:58    Assessment: 78-year-old male presenting with chest pain, acute on chronic anemia, melena, heme positive stool while on Eliquis and currently on hold.   Acute on chronic anemia: 7.6 on admission and now 9.4 s/p 2 units PRBCs. No overt GI bleeding.   No prior EGD or colonoscopy. Anemia likely multifactorial in setting of chronic disease and +/- IDA (ferritin low end normal at 43, iron low at 23, sats low at 5%). Tentative plans for EGD on 12/31 if stable.   Chest pain: cardiology consulted and felt chest pain atypical. Recent cath on file. No further ischemic evaluation this  admission. Denying chest pain currently.   Dysphagia: chronic to solid foods. No prior EGD. +/- dilation at time of EGD.  Ascites on renal US: mild to moderate. Abdomen protuberant; interestingly, patient states he feels his abdomen is less distended than normal. Liver contour has appeared normal on outside images. Chronically intermittent thrombocytopenia. Query congestive hepatopathy playing a role. Consider para prior to discharge.   Chronic cholecystitis and poor surgical candidate: cholecystostomy tube by [facility] and recently exchanged. Alk phos and bilirubin trending up. Needs follow-up with IR at [facility].    Plan: EGD +/- dilation on 12/31 if cardiopulmonary status stable.  Continue PPI BID Continue full liquids and will make NPO after midnight in preparation for potential EGD on 12/31 Continue to hold Eliquis Follow CBC, HFP Consider para prior to discharge Needs IR follow-up at [facility] due to cholecystostomy tube     W. , PhD, ANP-BC [facility]     LOS: 0 days    04/14/2019, 7:23 AM    

## 2019-04-14 NOTE — Progress Notes (Signed)
Discussed with with GI, from cardiac standpoint no contraindications to endoscopy   Carlyle Dolly MD

## 2019-04-14 NOTE — Progress Notes (Signed)
PROGRESS NOTE Matthew Campos   Matthew Campos  [id number]  DOB: 1941-02-24  DOA: 04/13/2019 PCP: System, Pcp Not In   Brief Admission Hx: 78 y.o. 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  MDM/Assessment & Plan:   1. Chest pain-patient did have a significant chest pain episode when admitted but after taking nitroglycerin and aspirin his symptoms have completely resolved. With his significant cardiac history we asked for cardiology opinion and he has not required any further workup. 2. AKI on CKD stage 3 - reviewing old labs 01/2019 his creatinine was 1.4 and GFR was 45 at that time,  hold diuretics, hold losartan, follow renal function panel closely.  Renally dose medications.    3. Paroxysmal atrial fibrillation-his rate is well controlled.  His apixaban which we are currently holding inside of GI bleeding and heme positive stool 4. GERD-Protonix ordered for GI protection IV 40 mg twice daily. 5. Ascites - may need paracentesis, deferring to GI team.  6. Guaiac positive stools-he has been started on empiric IV Protonix as above.  Holding apixaban and aspirin and asked for GI consult.  He has been typed and screened and transfused 2 units packed red blood cells.   7. Chronic blood loss anemia-likely secondary to upper GI bleeding-transfused 2 units PRBCs as ordered. Hg up to 9.4.   8. Chronic cholecystitis-pt has a cholecystostomy tube in place which he says is indefinite.  He has follow up with [facility]. 9. Hyperbilirubinemia - will follow.  10. COPD-stable resume home  bronchodilators. 11. Hyperlipidemia-resume home rosuvastatin.  DVT prophylaxis: SCDs Code Status: Full Family Communication: No family present at bedside Disposition Plan:   Consults called: GI, cardio Admission status: Patient   Consultants:  GI  cardiology  Procedures:    Antimicrobials:     Subjective: He is feeling better today.  No chest pain and no SOB.  No BM.   Objective: Vitals:   04/14/19 0800 04/14/19 0900 04/14/19 1000 04/14/19 1126  BP: (!) 116/57     Pulse: 63 66 60   Resp: 12 18 13    Temp: 98.9 F (37.2 C)   99.3 F (37.4 C)  TempSrc: Oral   Oral  SpO2: 93% 90% 96%   Weight:      Height:        Intake/Output Summary (Last 24 hours) at 04/14/2019 1233 Last data filed at 04/14/2019 0500 Gross per 24 hour  Intake 702.5 ml  Output 500 ml  Net 202.5 ml   Filed Weights   04/13/19 0425 04/13/19 1632 04/14/19 0500  Weight: 97.1 kg 99.5 kg 100 kg   REVIEW OF SYSTEMS  As per history otherwise all reviewed and reported negative  Exam:  General exam: chronically ill appearing male, NAD. Cooperative.  Respiratory system: Clear. No increased work of breathing. Cardiovascular system: S1 & S2 heard. No JVD, murmurs, gallops, clicks or pedal edema. Gastrointestinal system: Abdomen is distended with ascites, soft and nontender. Normal bowel sounds heard. Central nervous system: Alert and oriented. No focal neurological deficits. Extremities: 1+ edema BLE.  Data Reviewed: Basic Metabolic Panel: Recent Labs  Lab 04/13/19 0500 04/14/19 0527  NA 138 137  K  3.7 3.9  CL 94* 96*  CO2 31 29  GLUCOSE 183* 190*  BUN 42* 42*  CREATININE 2.21* 2.04*  CALCIUM 8.6* 8.4*  MG  --  1.9   Liver Function Tests: Recent Labs  Lab 04/13/19 0500 04/14/19 0527  AST 37 39  ALT 18 35  ALKPHOS 171* 210*  BILITOT 1.4* 2.9*  PROT 6.8 6.7  ALBUMIN 3.3* 3.2*   Recent Labs  Lab 04/13/19 0500  LIPASE 29   No results for input(s): AMMONIA in the last 168  hours. CBC: Recent Labs  Lab 04/13/19 0500 04/14/19 0111 04/14/19 0527  WBC 7.5  --  6.9  NEUTROABS 6.4  --  6.1  HGB 7.6* 9.5* 9.4*  HCT 26.0* 31.2* 30.8*  MCV 98.5  --  96.9  PLT 154  --  130*   Cardiac Enzymes: No results for input(s): CKTOTAL, CKMB, CKMBINDEX, TROPONINI in the last 168 hours. CBG (last 3)  Recent Labs    04/13/19 2058 04/14/19 0747 04/14/19 1125  GLUCAP 216* 161* 188*   Recent Results (from the past 240 hour(s))  SARS CORONAVIRUS 2 (TAT 6-24 HRS) Nasopharyngeal Nasopharyngeal Swab     Status: None   Collection Time: 04/13/19 10:12 AM   Specimen: Nasopharyngeal Swab  Result Value Ref Range Status   SARS Coronavirus 2 NEGATIVE NEGATIVE Final    Comment: (NOTE) SARS-CoV-2 target nucleic acids are NOT DETECTED. The SARS-CoV-2 RNA is generally detectable in upper and lower respiratory specimens during the acute phase of infection. Negative results do not preclude SARS-CoV-2 infection, do not rule out co-infections with other pathogens, and should not be used as the sole basis for treatment or other patient management decisions. Negative results must be combined with clinical observations, patient history, and epidemiological information. The expected result is Negative. Fact Sheet for Patients: SugarRoll.be Fact Sheet for Healthcare Providers: https://www.woods-mathews.com/ This test is not yet approved or cleared by the [place] FDA and  has been authorized for detection and/or diagnosis of SARS-CoV-2 by FDA under an Emergency Use Authorization (EUA). This EUA will remain  in effect (meaning this test can be used) for the duration of the COVID-19 declaration under Section 56 4(b)(1) of the Act, 21 U.S.C. section 360bbb-3(b)(1), unless the authorization is terminated or revoked sooner. Performed at [facility], [place] [address] [place], [place] [address]   MRSA PCR Screening     Status: None    Collection Time: 04/13/19  5:07 PM   Specimen: Nasal Mucosa; Nasopharyngeal  Result Value Ref Range Status   MRSA by PCR NEGATIVE NEGATIVE Final    Comment:        The GeneXpert MRSA Assay (FDA approved for NASAL specimens only), is one component of a comprehensive MRSA colonization surveillance program. It is not intended to diagnose MRSA infection nor to guide or monitor treatment for MRSA infections. Performed at [facility], [address] [place], [place] [address]      Studies: US Renal  Result Date: 04/13/2019 CLINICAL DATA:  Acute on chronic kidney injury. EXAM: RENAL / URINARY TRACT ULTRASOUND COMPLETE COMPARISON:  None FINDINGS: Right Kidney: Renal measurements: 10.5 x 5.0 x 5.2 cm = volume: 141.0 mL . Echogenicity within normal limits. No mass or hydronephrosis visualized. Left Kidney: Renal measurements: 11.7 by 5.3 x 6.0 cm = volume: 194.1 mL. Echogenicity within normal limits. No mass or hydronephrosis visualized. Bladder: Not visualized Other: Mild to moderate volume of ascites identified within the right lower quadrant and left lower  quadrant of the abdomen. IMPRESSION: Normal appearance of the kidneys.  No hydronephrosis or mass. Mild to moderate abdominal ascites Electronically Signed   By: Kerby Moors M.D.   On: 04/13/2019 10:54   DG Chest Port 1 View  Result Date: 04/13/2019 CLINICAL DATA:  Awoke from sleep with chest pain. EXAM: PORTABLE CHEST 1 VIEW COMPARISON:  None. FINDINGS: The heart is enlarged. Prosthetic aortic valve. Vascular congestion with perivascular haziness. Suspected small bilateral pleural effusions. No pneumothorax. No acute osseous abnormalities are seen. Symmetric widening of the acromioclavicular joints. IMPRESSION: Cardiomegaly with vascular congestion and perivascular haziness, suspicious for pulmonary edema. Suspected small bilateral pleural effusions. Overall findings suspicious for CHF. Electronically Signed   By: Keith Rake M.D.    On: 04/13/2019 04:58     Scheduled Meds: . Chlorhexidine Gluconate Cloth  6 each Topical Q0600  . citalopram  20 mg Oral Daily  . furosemide  40 mg Intravenous BID  . insulin aspart  0-5 Units Subcutaneous QHS  . insulin aspart  0-9 Units Subcutaneous TID WC  . insulin aspart  3 Units Subcutaneous TID WC  . levothyroxine  150 mcg Oral QAC breakfast  . mouth rinse  15 mL Mouth Rinse BID  . mometasone-formoterol  2 puff Inhalation BID  . multivitamin-lutein  1 capsule Oral Daily  . pantoprazole (PROTONIX) IV  40 mg Intravenous Q12H  . rosuvastatin  10 mg Oral q1800  . sodium chloride flush  3 mL Intravenous Q12H   Continuous Infusions: . sodium chloride      Active Problems:   UGI bleed   Chest pain   History of recent transcatheter aortic valve replacement (TAVR)   Cholecystostomy care (HCC)   Chronic blood loss anemia   COPD (chronic obstructive pulmonary disease) (HCC)   Coronary artery disease   Diabetes mellitus without complication (HCC)   Chronic ischemic heart disease   GERD (gastroesophageal reflux disease)   Paroxysmal atrial fibrillation (HCC)   Cholecystitis   OSA (obstructive sleep apnea)   Coagulopathy (HCC)   Chronic anticoagulation   Acute on chronic diastolic heart failure ([place])   GI bleeding   Time spent:   Irwin Brakeman, MD [facility] 04/14/2019, 12:33 PM    LOS: 0 days  How to contact the [facility] Attending or Consulting provider [place] or covering provider during after hours [place], for this patient?  1. Check the care team in [facility] and look for a) attending/consulting [facility] provider listed and b) the [facility] team listed 2. Log into [email or web address] and use [place]'s universal password to access. If you do not have the password, please contact the hospital operator. 3. Locate the [facility] provider you are looking for under [facility] and page to a number that you can be directly reached. 4. If you still have difficulty reaching the provider,  please page the [facility] (Director on Call) for the Hospitalists listed on amion for assistance.

## 2019-04-15 ENCOUNTER — Encounter (HOSPITAL_COMMUNITY): Payer: Self-pay | Admitting: Family Medicine

## 2019-04-15 ENCOUNTER — Encounter (HOSPITAL_COMMUNITY)
Admission: EM | Disposition: A | Discharge status: Home Health | Payer: Self-pay | Source: Home / Self Care | Attending: Internal Medicine

## 2019-04-15 ENCOUNTER — Inpatient Hospital Stay (HOSPITAL_COMMUNITY): Payer: Medicare HMO | Admitting: Anesthesiology

## 2019-04-15 DIAGNOSIS — K317 Polyp of stomach and duodenum: Secondary | ICD-10-CM

## 2019-04-15 DIAGNOSIS — K921 Melena: Principal | ICD-10-CM

## 2019-04-15 DIAGNOSIS — K2970 Gastritis, unspecified, without bleeding: Secondary | ICD-10-CM

## 2019-04-15 HISTORY — PX: POLYPECTOMY: SHX5525

## 2019-04-15 HISTORY — PX: ESOPHAGOGASTRODUODENOSCOPY (EGD) WITH PROPOFOL: SHX5813

## 2019-04-15 LAB — CBC WITH DIFFERENTIAL/PLATELET
Abs Immature Granulocytes: 0.02 10*3/uL (ref 0.00–0.07)
Basophils Absolute: 0 10*3/uL (ref 0.0–0.1)
Basophils Relative: 0 %
Eosinophils Absolute: 0 10*3/uL (ref 0.0–0.5)
Eosinophils Relative: 0 %
HCT: 29.5 % — ABNORMAL LOW (ref 39.0–52.0)
Hemoglobin: 8.8 g/dL — ABNORMAL LOW (ref 13.0–17.0)
Immature Granulocytes: 1 %
Lymphocytes Relative: 6 %
Lymphs Abs: 0.2 10*3/uL — ABNORMAL LOW (ref 0.7–4.0)
MCH: 28.9 pg (ref 26.0–34.0)
MCHC: 29.8 g/dL — ABNORMAL LOW (ref 30.0–36.0)
MCV: 96.7 fL (ref 80.0–100.0)
Monocytes Absolute: 0.2 10*3/uL (ref 0.1–1.0)
Monocytes Relative: 6 %
Neutro Abs: 2.9 10*3/uL (ref 1.7–7.7)
Neutrophils Relative %: 87 %
Platelets: 114 10*3/uL — ABNORMAL LOW (ref 150–400)
RBC: 3.05 MIL/uL — ABNORMAL LOW (ref 4.22–5.81)
RDW: 16.6 % — ABNORMAL HIGH (ref 11.5–15.5)
WBC: 3.4 10*3/uL — ABNORMAL LOW (ref 4.0–10.5)
nRBC: 0 % (ref 0.0–0.2)

## 2019-04-15 LAB — COMPREHENSIVE METABOLIC PANEL
ALT: 26 U/L (ref 0–44)
AST: 25 U/L (ref 15–41)
Albumin: 3 g/dL — ABNORMAL LOW (ref 3.5–5.0)
Alkaline Phosphatase: 189 U/L — ABNORMAL HIGH (ref 38–126)
Anion gap: 14 (ref 5–15)
BUN: 38 mg/dL — ABNORMAL HIGH (ref 8–23)
CO2: 31 mmol/L (ref 22–32)
Calcium: 8.8 mg/dL — ABNORMAL LOW (ref 8.9–10.3)
Chloride: 94 mmol/L — ABNORMAL LOW (ref 98–111)
Creatinine, Ser: 1.8 mg/dL — ABNORMAL HIGH (ref 0.61–1.24)
GFR calc Af Amer: 41 mL/min — ABNORMAL LOW (ref >60)
GFR calc non Af Amer: 35 mL/min — ABNORMAL LOW (ref >60)
Glucose, Bld: 152 mg/dL — ABNORMAL HIGH (ref 70–99)
Potassium: 3.9 mmol/L (ref 3.5–5.1)
Sodium: 139 mmol/L (ref 135–145)
Total Bilirubin: 2.7 mg/dL — ABNORMAL HIGH (ref 0.3–1.2)
Total Protein: 6.5 g/dL (ref 6.5–8.1)

## 2019-04-15 LAB — GLUCOSE, CAPILLARY
Glucose-Capillary: 143 mg/dL — ABNORMAL HIGH (ref 70–99)
Glucose-Capillary: 170 mg/dL — ABNORMAL HIGH (ref 70–99)
Glucose-Capillary: 185 mg/dL — ABNORMAL HIGH (ref 70–99)
Glucose-Capillary: 145 mg/dL — ABNORMAL HIGH (ref 70–99)
Glucose-Capillary: 207 mg/dL — ABNORMAL HIGH (ref 70–99)

## 2019-04-15 LAB — MAGNESIUM: Magnesium: 2 mg/dL (ref 1.7–2.4)

## 2019-04-15 SURGERY — ESOPHAGOGASTRODUODENOSCOPY (EGD) WITH PROPOFOL
Anesthesia: General

## 2019-04-15 MED ORDER — LIDOCAINE VISCOUS HCL 2 % MT SOLN
OROMUCOSAL | Status: DC | PRN
  Administered 2019-04-15: 5 mL via OROMUCOSAL

## 2019-04-15 MED ORDER — LACTATED RINGERS IV SOLN: Freq: Once | INTRAVENOUS | Status: DC

## 2019-04-15 MED ORDER — PROPOFOL 500 MG/50ML IV EMUL
INTRAVENOUS | Status: DC | PRN
  Administered 2019-04-15: 150 ug/kg/min via INTRAVENOUS

## 2019-04-15 MED ORDER — PANTOPRAZOLE SODIUM 40 MG PO TBEC
40.0000 mg | DELAYED_RELEASE_TABLET | Freq: Two times a day (BID) | ORAL | Status: DC
  Administered 2019-04-15 – 2019-04-19 (×8): 40 mg via ORAL
  Filled 2019-04-15 (×8): qty 1

## 2019-04-15 MED ORDER — FUROSEMIDE 10 MG/ML IJ SOLN
40.0000 mg | Freq: Four times a day (QID) | INTRAMUSCULAR | Status: AC
  Administered 2019-04-15 (×2): 40 mg via INTRAVENOUS
  Filled 2019-04-15 (×2): qty 4

## 2019-04-15 MED ORDER — LIDOCAINE VISCOUS HCL 2 % MT SOLN
OROMUCOSAL | Status: AC
  Filled 2019-04-15: qty 15

## 2019-04-15 MED ORDER — STERILE WATER FOR IRRIGATION IR SOLN: Status: DC | PRN

## 2019-04-15 MED ORDER — PHENYLEPHRINE 40 MCG/ML (10ML) SYRINGE FOR IV PUSH (FOR BLOOD PRESSURE SUPPORT)
PREFILLED_SYRINGE | INTRAVENOUS | Status: AC
  Filled 2019-04-15: qty 10

## 2019-04-15 MED ORDER — PROPOFOL 10 MG/ML IV BOLUS
INTRAVENOUS | Status: AC
  Filled 2019-04-15: qty 40

## 2019-04-15 MED ORDER — LACTATED RINGERS IV SOLN: INTRAVENOUS | Status: DC | PRN

## 2019-04-15 MED ORDER — LIDOCAINE VISCOUS HCL 2 % MT SOLN: 5.0000 mL | Freq: Once | OROMUCOSAL | Status: DC

## 2019-04-15 MED ORDER — SODIUM CHLORIDE 0.9 % IV SOLN: INTRAVENOUS | Status: DC

## 2019-04-15 MED ORDER — KETAMINE HCL 10 MG/ML IJ SOLN
INTRAMUSCULAR | Status: DC | PRN
  Administered 2019-04-15: 30 mg via INTRAVENOUS

## 2019-04-15 MED ORDER — SODIUM CHLORIDE 0.9 % IV SOLN
INTRAVENOUS | Status: DC | PRN
  Administered 2019-04-15 (×4): 100 ug via INTRAVENOUS

## 2019-04-15 MED ORDER — KETAMINE HCL 50 MG/5ML IJ SOSY
PREFILLED_SYRINGE | INTRAMUSCULAR | Status: AC
  Filled 2019-04-15: qty 5

## 2019-04-15 NOTE — Progress Notes (Signed)
Progress Note  Patient Name: Matthew Campos Date of Encounter: 04/15/2019  Primary Cardiologist: [facility]  Subjective   No complaints  Inpatient Medications    Scheduled Meds: . Chlorhexidine Gluconate Cloth  6 each Topical Q0600  . citalopram  20 mg Oral Daily  . insulin aspart  0-5 Units Subcutaneous QHS  . insulin aspart  0-9 Units Subcutaneous TID WC  . insulin aspart  3 Units Subcutaneous TID WC  . levothyroxine  150 mcg Oral QAC breakfast  . lidocaine  5 mL Mouth/Throat Once  . mouth rinse  15 mL Mouth Rinse BID  . mometasone-formoterol  2 puff Inhalation BID  . multivitamin-lutein  1 capsule Oral Daily  . pantoprazole  40 mg Oral BID AC  . rosuvastatin  10 mg Oral q1800  . sodium chloride flush  3 mL Intravenous Q12H   Continuous Infusions: . sodium chloride    . sodium chloride     PRN Meds: sodium chloride, acetaminophen **OR** acetaminophen, LORazepam, morphine injection, nitroGLYCERIN, ondansetron **OR** ondansetron (ZOFRAN) IV, sodium chloride flush, traZODone   Vital Signs    Vitals:   04/15/19 1030 04/15/19 1057 04/15/19 1100 04/15/19 1147  BP: (!) 105/55     Pulse: 74 84 73   Resp: 12 13 14    Temp:    99.4 F (37.4 C)  TempSrc:    Oral  SpO2: 96%     Weight:      Height:        Intake/Output Summary (Last 24 hours) at 04/15/2019 1215 Last data filed at 04/15/2019 1010 Gross per 24 hour  Intake 200 ml  Output 1250 ml  Net -1050 ml   Last 3 Weights 04/15/2019 04/15/2019 04/14/2019  Weight (lbs) 221 lb 12.5 oz 221 lb 12.5 oz 220 lb 7.4 oz  Weight (kg) 100.6 kg 100.6 kg 100 kg      Telemetry    Rate controlled afib- Personally Reviewed  ECG    n/a - Personally Reviewed  Physical Exam   GEN: No acute distress.   Neck: No JVD Cardiac: irreg Respiratory: Clear to auscultation bilaterally. GI: Soft, nontender, non-distended  MS: 1+ bialteral LE e3dema; No deformity. Neuro:  Nonfocal  Psych: Normal affect   Labs    High  Sensitivity Troponin:   Recent Labs  Lab 04/13/19 0500 04/13/19 0727 04/14/19 0111 04/14/19 0527  TROPONINIHS 20* 19* 28* 31*      Chemistry Recent Labs  Lab 04/13/19 0500 04/14/19 0527 04/15/19 0442  NA 138 137 139  K 3.7 3.9 3.9  CL 94* 96* 94*  CO2 31 29 31   GLUCOSE 183* 190* 152*  BUN 42* 42* 38*  CREATININE 2.21* 2.04* 1.80*  CALCIUM 8.6* 8.4* 8.8*  PROT 6.8 6.7 6.5  ALBUMIN 3.3* 3.2* 3.0*  AST 37 39 25  ALT 18 35 26  ALKPHOS 171* 210* 189*  BILITOT 1.4* 2.9* 2.7*  GFRNONAA 28* 30* 35*  GFRAA 32* 35* 41*  ANIONGAP 13 12 14      Hematology Recent Labs  Lab 04/13/19 0500 04/14/19 0111 04/14/19 0527 04/15/19 0442  WBC 7.5  --  6.9 3.4*  RBC 2.64*  --  3.18* 3.05*  HGB 7.6* 9.5* 9.4* 8.8*  HCT 26.0* 31.2* 30.8* 29.5*  MCV 98.5  --  96.9 96.7  MCH 28.8  --  29.6 28.9  MCHC 29.2*  --  30.5 29.8*  RDW 16.6*  --  16.6* 16.6*  PLT 154  --  130* 114*  BNP Recent Labs  Lab 04/13/19 1426  BNP 657.0*     DDimer No results for input(s): DDIMER in the last 168 hours.   Radiology    No results found.  Cardiac Studies     Patient Profile     Matthew Campos a 78 y.o.malewith past medical history of CAD (s/p prior stenting of LAD and RCA, catheterization in 11/2018 showing patent stents and 30% OM3 stenosis, severe AS (s/p TAVR in 12/2018 with 26 mm Sapien valve), known sinus bradycardia, HTN, HLD, AAA (s/p endovascular repair in 04/11), chronic cholecystitis (s/p tube placement) and COPDwho is being seen today for the evaluation ofchest painat the request ofDr. Wynetta Emery.  Assessment & Plan    1. Atypical chest pain/CAD - he is s/p prior stenting of LAD and RCAwith recentcatheterization in 11/2018 showing patent stents and 30% OM3 stenosis  - mild nonspecific trop elevation - cath 11/2018 at [facility] without obstructive disease - chest pain has resolved this AM.  - no plans for ischemic testing at this time  2. History of aortic  stenosis s/p TAVR - normal valve function by echo at [facility] just last month  3. Acute on chronic diasotlic HF - severe LE edema on presentation. CXR with vascular congestion. BNP 657 - 02/2019 echo [facility] normal LVEF, normal AVR  - received IV lasix 40mg  bid yesterday, I/Os incomplete. Downtrend in Cr with diuresis consistent with venous congestion and CHF. Remains volume overloaded by exam, redose IV lasix 40mg  bid today and reasssess tomorrow.    4. Afib Thiswas a new diagnosis for the patient at the time of his follow-up in 02/2019. He denies any recent palpitations and is in atrial fibrillation by review of telemetry with rates variable from the high 30's to 70's. He does have pauses up to 2.14 seconds. By review of his merged chart, he was not on AV nodal blocking agents.A Zio Monitor had been ordered but results are not available for review. - holding eliquis given GI bleed  5. GI bleed - per GI. Admitted with Hgb 7.6, heme +stool. He reports black stools at home  6. AKI on CKD - labile Cr trends from reviewing care everywhere (he has 2 MRNs at [facility]), ranging from 1.16 to 2.45 - trending down with diuresis.    For questions or updates, please contact [place] Please consult [email or web address] for contact info under        Signed, Carlyle Dolly, MD  04/15/2019, 12:15 PM

## 2019-04-15 NOTE — Interval H&P Note (Signed)
History and Physical Interval Note:  DISCUSSED PROCEDURE, BENEFITS, & RISKS: < 1% chance of medication reaction, bleeding, perforation, or ASPIRATION.   04/15/2019 9:17 AM  Matthew Campos  has presented today for surgery, with the diagnosis of anemia, heme positive stool, melena.  The various methods of treatment have been discussed with the patient and family. After consideration of risks, benefits and other options for treatment, the patient has consented to  Procedure(s) with comments: ESOPHAGOGASTRODUODENOSCOPY (EGD) WITH PROPOFOL (N/A) - possible dilation as a surgical intervention.  The patient's history has been reviewed, patient examined, no change in status, stable for surgery.  I have reviewed the patient's chart and labs.  Questions were answered to the patient's satisfaction.     [employer]

## 2019-04-15 NOTE — Transfer of Care (Signed)
Immediate Anesthesia Transfer of Care Note  Patient: Matthew Campos  Procedure(s) Performed: ESOPHAGOGASTRODUODENOSCOPY (EGD) WITH PROPOFOL (N/A ) POLYPECTOMY  Patient Location: PACU  Anesthesia Type:MAC  Level of Consciousness: awake, alert  and oriented  Airway & Oxygen Therapy: Patient Spontanous Breathing and Patient connected to face mask oxygen  Post-op Assessment: Report given to RN and Post -op Vital signs reviewed and stable  Post vital signs: Reviewed and stable  Last Vitals:  Vitals Value Taken Time  BP 99/59 04/15/19 1018  Temp    Pulse 74 04/15/19 1022  Resp 13 04/15/19 1022  SpO2 98 % 04/15/19 1022  Vitals shown include unvalidated device data.  Last Pain:  Vitals:   04/15/19 1016  TempSrc:   PainSc: (P) Asleep      Patients Stated Pain Goal: 9 (14/70/92 9574)  Complications: No apparent anesthesia complications

## 2019-04-15 NOTE — Anesthesia Postprocedure Evaluation (Signed)
Anesthesia Post Note  Patient: Matthew Campos  Procedure(s) Performed: ESOPHAGOGASTRODUODENOSCOPY (EGD) WITH PROPOFOL (N/A ) POLYPECTOMY  Patient location during evaluation: PACU Anesthesia Type: MAC Level of consciousness: awake, sedated and patient cooperative Pain management: pain level controlled Vital Signs Assessment: post-procedure vital signs reviewed and stable Respiratory status: spontaneous breathing, respiratory function stable and nonlabored ventilation Cardiovascular status: stable Postop Assessment: no apparent nausea or vomiting Anesthetic complications: no     Last Vitals:  Vitals:   04/15/19 0851 04/15/19 1016  BP: (!) 99/44 (!) 99/59  Pulse: 74 90  Resp: 18 16  Temp: 36.8 C (!) 38.1 C  SpO2:  100%    Last Pain:  Vitals:   04/15/19 1016  TempSrc:   PainSc: Asleep                 Sevastian Witczak

## 2019-04-15 NOTE — Progress Notes (Addendum)
PROGRESS NOTE [facility]   NOAH QUINCEY  [id number]  DOB: 27-Mar-1941  DOA: 04/13/2019 PCP: System, Pcp Not In   Brief Admission Hx: 78 y.o. 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  MDM/Assessment & Plan:   1. Chest pain-RESOLVED NOW. Patient did have a significant chest pain episode when admitted but after taking nitroglycerin and aspirin his symptoms have completely resolved. With his significant cardiac history we asked for cardiology opinion and he has not required any further workup. 2. AKI on CKD stage 3 - reviewing old labs 01/2019 his creatinine was 1.4 and GFR was 45 at that time,  hold diuretics, hold losartan, follow renal function panel closely.  Renally dose medications. Baseline 1.5-2.  3. Paroxysmal atrial fibrillation-his rate is well controlled.  His apixaban which we are currently holding inside of GI bleeding and heme positive stool . 4. GERD-Protonix ordered for GI protection IV 40 mg twice daily. 5. Ascites - may need paracentesis, deferring to GI team.  6. Guaiac positive stools-he has been started on empiric IV Protonix as above.  Holding apixaban and aspirin and asked for GI consult.  He has been typed and screened and transfused 2 units packed red blood cells.  EGD planned for 12/31.  7. Chronic blood loss anemia-likely secondary to upper GI bleeding-transfused 2 units PRBCs as ordered. Hg up to 9.4, now 8.8.   8. Chronic cholecystitis-pt has a cholecystostomy tube in place which he says is indefinite.  He has follow up with [facility]. 9.  Hyperbilirubinemia - will follow.  10. COPD-stable resume home bronchodilators. 11. Hyperlipidemia-resume home rosuvastatin.  DVT prophylaxis: SCDs Code Status: Full Family Communication: updated wife who was at bedside following EGD Disposition Plan:   Consults called: GI, cardio Admission status: Patient   Consultants:  GI  cardiology  Procedures:  EGD pending 04/15/19  Antimicrobials:     Subjective: He says he feels stronger today.  He is agreeable to proceed with EGD.   Objective: Vitals:   04/15/19 0300 04/15/19 0400 04/15/19 0500 04/15/19 0736  BP: 119/61     Pulse: 73     Resp: 16     Temp:  99.1 F (37.3 C)  99.4 F (37.4 C)  TempSrc:  Oral  Oral  SpO2: (!) 89%     Weight:   100.6 kg   Height:        Intake/Output Summary (Last 24 hours) at 04/15/2019 [id number] Last data filed at 04/15/2019 0500 Gross per 24 hour  Intake -  Output 1250 ml  Net -1250 ml   Filed Weights   04/13/19 1632 04/14/19 0500 04/15/19 0500  Weight: 99.5 kg 100 kg 100.6 kg   REVIEW OF SYSTEMS  As per history otherwise all reviewed and reported negative  Exam:  General exam: chronically ill appearing male, NAD. Cooperative.  Respiratory system: Clear. No increased work of breathing. Cardiovascular system: S1 & S2 heard. No JVD, murmurs, gallops, clicks or pedal edema. Gastrointestinal system: Abdomen is distended with ascites, soft and nontender. Normal bowel sounds heard. Central nervous system: Alert and oriented. No focal neurological deficits. Extremities: 1+ edema BLE.  Data Reviewed: Basic Metabolic Panel: Recent  Labs  Lab 04/13/19 0500 04/14/19 0527 04/15/19 0442  NA 138 137 139  K 3.7 3.9 3.9  CL 94* 96* 94*  CO2 31 29 31   GLUCOSE 183* 190* 152*  BUN 42* 42* 38*  CREATININE 2.21* 2.04* 1.80*  CALCIUM 8.6* 8.4* 8.8*  MG  --  1.9 2.0   Liver Function Tests: Recent Labs  Lab 04/13/19 0500 04/14/19 0527 04/15/19 0442  AST 37 39 25  ALT 18 35 26   ALKPHOS 171* 210* 189*  BILITOT 1.4* 2.9* 2.7*  PROT 6.8 6.7 6.5  ALBUMIN 3.3* 3.2* 3.0*   Recent Labs  Lab 04/13/19 0500  LIPASE 29   No results for input(s): AMMONIA in the last 168 hours. CBC: Recent Labs  Lab 04/13/19 0500 04/14/19 0111 04/14/19 0527 04/15/19 0442  WBC 7.5  --  6.9 3.4*  NEUTROABS 6.4  --  6.1 2.9  HGB 7.6* 9.5* 9.4* 8.8*  HCT 26.0* 31.2* 30.8* 29.5*  MCV 98.5  --  96.9 96.7  PLT 154  --  130* 114*   Cardiac Enzymes: No results for input(s): CKTOTAL, CKMB, CKMBINDEX, TROPONINI in the last 168 hours. CBG (last 3)  Recent Labs    04/14/19 1604 04/14/19 2110 04/15/19 0738  GLUCAP 163* 145* 143*   Recent Results (from the past 240 hour(s))  SARS CORONAVIRUS 2 (TAT 6-24 HRS) Nasopharyngeal Nasopharyngeal Swab     Status: None   Collection Time: 04/13/19 10:12 AM   Specimen: Nasopharyngeal Swab  Result Value Ref Range Status   SARS Coronavirus 2 NEGATIVE NEGATIVE Final    Comment: (NOTE) SARS-CoV-2 target nucleic acids are NOT DETECTED. The SARS-CoV-2 RNA is generally detectable in upper and lower respiratory specimens during the acute phase of infection. Negative results do not preclude SARS-CoV-2 infection, do not rule out co-infections with other pathogens, and should not be used as the sole basis for treatment or other patient management decisions. Negative results must be combined with clinical observations, patient history, and epidemiological information. The expected result is Negative. Fact Sheet for Patients: SugarRoll.be Fact Sheet for Healthcare Providers: https://www.woods-mathews.com/ This test is not yet approved or cleared by the [place] FDA and  has been authorized for detection and/or diagnosis of SARS-CoV-2 by FDA under an Emergency Use Authorization (EUA). This EUA will remain  in effect (meaning this test can be used) for the duration of the COVID-19 declaration under Section  56 4(b)(1) of the Act, 21 U.S.C. section 360bbb-3(b)(1), unless the authorization is terminated or revoked sooner. Performed at [facility], [place] [address] [place], [place] [address]   MRSA PCR Screening     Status: None   Collection Time: 04/13/19  5:07 PM   Specimen: Nasal Mucosa; Nasopharyngeal  Result Value Ref Range Status   MRSA by PCR NEGATIVE NEGATIVE Final    Comment:        The GeneXpert MRSA Assay (FDA approved for NASAL specimens only), is one component of a comprehensive MRSA colonization surveillance program. It is not intended to diagnose MRSA infection nor to guide or monitor treatment for MRSA infections. Performed at [facility], [address] [place], [place] [address]      Studies: US Renal  Result Date: 04/13/2019 CLINICAL DATA:  Acute on chronic kidney injury. EXAM: RENAL / URINARY TRACT ULTRASOUND COMPLETE COMPARISON:  None FINDINGS: Right Kidney: Renal measurements: 10.5 x 5.0 x 5.2 cm = volume: 141.0 mL . Echogenicity within normal limits. No mass or hydronephrosis visualized. Left Kidney:  Renal measurements: 11.7 by 5.3 x 6.0 cm = volume: 194.1 mL. Echogenicity within normal limits. No mass or hydronephrosis visualized. Bladder: Not visualized Other: Mild to moderate volume of ascites identified within the right lower quadrant and left lower quadrant of the abdomen. IMPRESSION: Normal appearance of the kidneys.  No hydronephrosis or mass. Mild to moderate abdominal ascites Electronically Signed   By: Kerby Moors M.D.   On: 04/13/2019 10:54     Scheduled Meds: . Chlorhexidine Gluconate Cloth  6 each Topical Q0600  . citalopram  20 mg Oral Daily  . insulin aspart  0-5 Units Subcutaneous QHS  . insulin aspart  0-9 Units Subcutaneous TID WC  . insulin aspart  3 Units Subcutaneous TID WC  . levothyroxine  150 mcg Oral QAC breakfast  . mouth rinse  15 mL Mouth Rinse BID  . mometasone-formoterol  2 puff Inhalation BID  . multivitamin-lutein  1  capsule Oral Daily  . pantoprazole (PROTONIX) IV  40 mg Intravenous Q12H  . rosuvastatin  10 mg Oral q1800  . sodium chloride flush  3 mL Intravenous Q12H   Continuous Infusions: . sodium chloride      Active Problems:   UGI bleed   Chest pain   History of recent transcatheter aortic valve replacement (TAVR)   Cholecystostomy care (HCC)   Chronic blood loss anemia   COPD (chronic obstructive pulmonary disease) (HCC)   Coronary artery disease   Diabetes mellitus without complication (HCC)   Chronic ischemic heart disease   GERD (gastroesophageal reflux disease)   Paroxysmal atrial fibrillation (HCC)   Cholecystitis   OSA (obstructive sleep apnea)   Coagulopathy (HCC)   Chronic anticoagulation   Acute on chronic diastolic heart failure (HCC)   GI bleeding   AKI (acute kidney injury) ([place])   Anemia due to blood loss   Bradycardia   Renal insufficiency   Time spent:   Irwin Brakeman, MD [facility] 04/15/2019, 8:37 AM    LOS: 1 day  How to contact the [facility] Attending or Consulting provider [place] or covering provider during after hours [place], for this patient?  1. Check the care team in [facility] and look for a) attending/consulting TRH provider listed and b) the [facility] team listed 2. Log into [email or web address] and use [place] universal password to access. If you do not have the password, please contact the hospital operator. 3. Locate the [facility] provider you are looking for under [facility] and page to a number that you can be directly reached. 4. If you still have difficulty reaching the provider, please page the [facility] (Director on Call) for the Hospitalists listed on amion for assistance.

## 2019-04-15 NOTE — Anesthesia Preprocedure Evaluation (Addendum)
Anesthesia Evaluation  Patient identified by MRN, date of birth, ID band Patient awake    Reviewed: Allergy & Precautions, NPO status , Patient's Chart, lab work & pertinent test results, reviewed documented beta blocker date and time   Airway Mallampati: III  TM Distance: >3 FB Neck ROM: Full    Dental  (+) Missing, Dental Advisory Given   Pulmonary sleep apnea and Continuous Positive Airway Pressure Ventilation , COPD, former smoker,    breath sounds clear to auscultation       Cardiovascular hypertension, Pt. on medications and Pt. on home beta blockers + CAD, + Cardiac Stents and +CHF  + Valvular Problems/Murmurs AS  Rhythm:Irregular Rate:Normal     Neuro/Psych PSYCHIATRIC DISORDERS Anxiety Depression    GI/Hepatic GERD  Medicated,Upper GI bleeding   Endo/Other  diabetes, Well Controlled, Type 2Hypothyroidism   Renal/GU Renal InsufficiencyRenal disease     Musculoskeletal   Abdominal (+) + obese,   Peds  Hematology  (+) anemia ,   Anesthesia Other Findings Attending note-   78 yo male history of aortic stenosis s/p TAVR, chronic cholecystitis with cholecystostomy tube in place, chronic anemia, afib on eliquis, DM2, VKD 3, admitted with chest pain.  In ER found to be anemic Hgb 7.6, Heme +stool.   Please see extensive cardiac history and recent cardiac testing done at [facility] as reported above  K 3.7 BUN 42 Cr 2.21 WBC 7.5 Hgb 7.6 Plt 154  hstrop 20-->19 FOBT + CXR cardiomegaly, vascular congestion EKG afib, slow ventricular resposne  Patient presents with atypical chest pain. Trop is flat and nonspecific. Recent cath 11/2018 at [facility] with patent coronaries. In the absence of objective evidence of ischemia would not plan for repeat ischemic testing. Agree with GI evaluation given pain, anemia and heme + stools. He had an echo just last month at [facility] with normal functioning AVR, normal LVEF. Suspect  his LE edema is related to some diastolic dysfunction, agree with IV lasix today 40mg  bid. Hold ASA and eliquis in setting of GI bleed.    Carlyle Dolly MD  Reproductive/Obstetrics                            Anesthesia Physical Anesthesia Plan  ASA: IV  Anesthesia Plan: General   Post-op Pain Management:    Induction: Intravenous  PONV Risk Score and Plan: TIVA  Airway Management Planned: Nasal Cannula, Natural Airway and Simple Face Mask  Additional Equipment:   Intra-op Plan:   Post-operative Plan: Possible Post-op intubation/ventilation and Extubation in OR  Informed Consent: I have reviewed the patients History and Physical, chart, labs and discussed the procedure including the risks, benefits and alternatives for the proposed anesthesia with the patient or authorized representative who has indicated his/her understanding and acceptance.     Dental advisory given  Plan Discussed with: CRNA and Surgeon  Anesthesia Plan Comments: (Possible GA with ETT was explained)       Anesthesia Quick Evaluation

## 2019-04-15 NOTE — Plan of Care (Signed)
Called patient TO DISCUSS EGD & PT BEING SLEEPY AFTERWARDS/LOW OXYGEN. EXPLAINED TO GRAND-DAUGHTER: ERICA. CALLED WIFE'S CELL AT [phone]. VM NOT SETUP.

## 2019-04-15 NOTE — Op Note (Signed)
[facility] Patient Name: Matthew Campos Procedure Date: 04/15/2019 9:23 AM MRN: [id number] Date of Birth: 01-30-1941 Attending MD: Barney Drain MD, MD CSN: [id number] Age: 78 Admit Type: Outpatient Procedure:                Upper GI endoscopy WITH COLD SNARE POLYPECTOMY/COLD                            FORCEPS BIOPSY Indications:              Melena, Anemia Providers:                Barney Drain MD, MD, Charlsie Quest. Theda Sers RN, RN,                            Nelma Rothman, Technician Referring MD:             Connye Burkitt. Rakes, NP-c, Medicines:                Propofol per Anesthesia Complications:            treated with administration of oxygen, treated with                            stabilization and transfer to the ICU Estimated Blood Loss:     Estimated blood loss was minimal. Procedure:                Pre-Anesthesia Assessment:                           - Prior to the procedure, a History and Physical                            was performed, and patient medications and                            allergies were reviewed. The patient's tolerance of                            previous anesthesia was also reviewed. The risks                            and benefits of the procedure and the sedation                            options and risks were discussed with the patient.                            All questions were answered, and informed consent                            was obtained. Prior Anticoagulants: The patient has                            taken Eliquis (apixaban), last dose was 3 days  prior to procedure. ASA Grade Assessment: III - A                            patient with severe systemic disease. After                            reviewing the risks and benefits, the patient was                            deemed in satisfactory condition to undergo the                            procedure.                           After obtaining informed  consent, the endoscope was                            passed under direct vision. Throughout the                            procedure, the patient's blood pressure, pulse, and                            oxygen saturations were monitored continuously. The                            [id number]) scope was introduced through the                            mouth, and advanced to the second part of duodenum.                            The upper GI endoscopy was performed with moderate                            difficulty due to the patient's oxygen                            desaturation. Successful completion of the                            procedure was aided by withdrawing and reinserting                            the scope, performing chin lift and administering                            oxygen. The patient tolerated the procedure poorly                            due to the patient's respiratory instability                            (  hypoxia). Scope In: 9:39:04 AM Scope Out: 10:00:15 AM Total Procedure Duration: 0 hours 21 minutes 11 seconds  Findings:      The examined esophagus was normal.      Multiple small sessile polyps with no bleeding and no stigmata of recent       bleeding were found in the gastric fundus and in the gastric body.      Diffuse moderate inflammation characterized by congestion (edema),       erythema and friability was found in the gastric fundus, in the gastric       body and in the gastric antrum.      A single 6 mm sessile polyp with no bleeding was found in the duodenal       bulb. The polyp was removed with a cold snare. Resection and retrieval       were complete.      Normal mucosa was found in the duodenal bulb. Biopsies for histology       were taken with a cold forceps for evaluation of celiac disease.      Normal mucosa was found in the second portion of the duodenum. Biopsies       for histology were taken with a cold forceps for  evaluation of celiac       disease.      -NO OLD BLOOD OR FRESH BLODO IN LUMEN PRIOR TO POLYPECTOMY/BIOPSIES IN       DUODENUM. Impression:               - NO OBVIOUS SOURCE FOR MELENA IDENTIFIED IN UPPER                            GI TRACT. IT MAY BE DUE TO MODERATE                            GASTRITIS/GASTRIC POLYPS IN SETTING OF ELIQUIS.                           - FEW GASTRIC POLYPS                           - A single duodenal polyp. Resected and retrieved. Moderate Sedation:      Per Anesthesia Care Recommendation:           - Return patient to ICU for ongoing care.                           - Soft diet and diabetic (ADA) diet.                           - Continue present medications. HOLD ELIQUIS FOR AT                            LEAST 48 HOURS. CONSIDER BENEFITS V. RISKS OF                            RE-STARTING IN PT WITH PLT CT < 150K.                           -  Await pathology results. PT IS NOT A CANDIDATE                            FOR [facility] CAPSULE BECAUSE HE IS NOT A CANDIDATE                            FOR SURGERY DUE TO HIS MULTIPLE CO-MORBIDITIES.                           - IF PT CONTINUES WITH A TRANSFUSION DEPENDENT                            ANEMIA, HE SHOULD HAVE A repeat upper endoscopy AND                            CONSIDER A COLONOSCOPY WITHIN 1-3 months AT [facility]                            WHEN PT CAN BE INTUBATED FOR AIRWAY                            PROTECTION/MANAGE HYPOXIA. Procedure Code(s):        --- Professional ---                           [phone], Esophagogastroduodenoscopy, flexible,                            transoral; with removal of tumor(s), polyp(s), or                            other lesion(s) by snare technique                           43239, 74, Esophagogastroduodenoscopy, flexible,                            transoral; with biopsy, single or multiple Diagnosis Code(s):        --- Professional ---                           K31.7,  Polyp of stomach and duodenum                           K29.70, Gastritis, unspecified, without bleeding                           K92.1, Melena (includes Hematochezia)                           D64.9, Anemia, unspecified CPT copyright 2019 American Medical Association. All rights reserved. The codes documented in this report are preliminary and upon coder review may  be revised to meet current compliance requirements. Barney Drain, MD Barney Drain MD, MD 04/15/2019 10:32:34 AM This report has been signed electronically. Number of Addenda:  0 

## 2019-04-16 LAB — CBC WITH DIFFERENTIAL/PLATELET
Abs Immature Granulocytes: 0.02 10*3/uL (ref 0.00–0.07)
Basophils Absolute: 0 10*3/uL (ref 0.0–0.1)
Basophils Relative: 0 %
Eosinophils Absolute: 0 10*3/uL (ref 0.0–0.5)
Eosinophils Relative: 1 %
HCT: 29.3 % — ABNORMAL LOW (ref 39.0–52.0)
Hemoglobin: 8.7 g/dL — ABNORMAL LOW (ref 13.0–17.0)
Immature Granulocytes: 1 %
Lymphocytes Relative: 8 %
Lymphs Abs: 0.2 10*3/uL — ABNORMAL LOW (ref 0.7–4.0)
MCH: 28.8 pg (ref 26.0–34.0)
MCHC: 29.7 g/dL — ABNORMAL LOW (ref 30.0–36.0)
MCV: 97 fL (ref 80.0–100.0)
Monocytes Absolute: 0.2 10*3/uL (ref 0.1–1.0)
Monocytes Relative: 10 %
Neutro Abs: 1.9 10*3/uL (ref 1.7–7.7)
Neutrophils Relative %: 80 %
Platelets: 100 10*3/uL — ABNORMAL LOW (ref 150–400)
RBC: 3.02 MIL/uL — ABNORMAL LOW (ref 4.22–5.81)
RDW: 16.2 % — ABNORMAL HIGH (ref 11.5–15.5)
WBC: 2.3 10*3/uL — ABNORMAL LOW (ref 4.0–10.5)
nRBC: 0 % (ref 0.0–0.2)

## 2019-04-16 LAB — GLUCOSE, CAPILLARY
Glucose-Capillary: 185 mg/dL — ABNORMAL HIGH (ref 70–99)
Glucose-Capillary: 258 mg/dL — ABNORMAL HIGH (ref 70–99)

## 2019-04-16 LAB — BASIC METABOLIC PANEL
Anion gap: 11 (ref 5–15)
BUN: 35 mg/dL — ABNORMAL HIGH (ref 8–23)
CO2: 31 mmol/L (ref 22–32)
Calcium: 8.3 mg/dL — ABNORMAL LOW (ref 8.9–10.3)
Chloride: 91 mmol/L — ABNORMAL LOW (ref 98–111)
Creatinine, Ser: 1.75 mg/dL — ABNORMAL HIGH (ref 0.61–1.24)
GFR calc Af Amer: 42 mL/min — ABNORMAL LOW (ref >60)
GFR calc non Af Amer: 36 mL/min — ABNORMAL LOW (ref >60)
Glucose, Bld: 221 mg/dL — ABNORMAL HIGH (ref 70–99)
Potassium: 3.7 mmol/L (ref 3.5–5.1)
Sodium: 133 mmol/L — ABNORMAL LOW (ref 135–145)

## 2019-04-16 MED ORDER — ALBUTEROL SULFATE (2.5 MG/3ML) 0.083% IN NEBU
INHALATION_SOLUTION | RESPIRATORY_TRACT | Status: AC
  Administered 2019-04-16: 2.5 mg
  Filled 2019-04-16: qty 3

## 2019-04-16 MED ORDER — FUROSEMIDE 10 MG/ML IJ SOLN
40.0000 mg | Freq: Two times a day (BID) | INTRAMUSCULAR | Status: DC
  Administered 2019-04-16 – 2019-04-19 (×7): 40 mg via INTRAVENOUS
  Filled 2019-04-16 (×7): qty 4

## 2019-04-16 MED ORDER — IPRATROPIUM BROMIDE 0.02 % IN SOLN
RESPIRATORY_TRACT | Status: AC
  Administered 2019-04-16: 0.5 mg
  Filled 2019-04-16: qty 2.5

## 2019-04-16 NOTE — Plan of Care (Signed)
Pt difficult to arouse after ABORTED EGD. At bedside now conversant and interactive. No confusion present. Explained to pt what happened during EGD. PATIENT VOICED HIS UNDERSTANDING.

## 2019-04-16 NOTE — Progress Notes (Signed)
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  1/1: Patient underwent EGD on 12/31 and was noted to have moderate gastritis with gastric polyps that were biopsied.  No active source of bleeding noted.  His hemoglobin remained stable this morning along with his vitals.  He continues to have some low pulse oximetry readings on his usual 2 L home nasal cannula.  We will plan to continue ongoing diuresis and recheck labs.  Assessment & Plan:   Active Problems:   UGI bleed   Chest pain   History of recent transcatheter aortic valve replacement (TAVR)   Cholecystostomy care (HCC)   Chronic blood loss anemia   COPD (chronic obstructive pulmonary disease) (HCC)   Coronary artery disease   Diabetes mellitus without complication (HCC)   Chronic ischemic heart disease   GERD (gastroesophageal reflux disease)   Paroxysmal atrial fibrillation (HCC)   Cholecystitis   OSA (obstructive sleep apnea)   Coagulopathy (HCC)   Chronic anticoagulation   Acute on chronic diastolic heart failure (HCC)   GI bleeding   AKI (acute kidney injury) ([place])   Anemia due to blood loss   Bradycardia   Renal insufficiency   1. Atypical chest pain in the setting of CAD.  Patient had mild nonspecific troponin elevation with no plans for ischemic testing at this time.  Recent cardiac catheterization 11/2018 at [facility] without obstructive disease. 2. Acute on chronic  diastolic congestive heart failure exacerbation.  Continue IV Lasix today and monitor urine output.  Still has some mild hypoxemia that has been associated. 3. 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 Renally dose medications. Baseline 1.5-2.  4. Moderate gastritis with gastric polyp status post biopsy 12/31.  No active bleeding noted on EGD on 12/31.  Plans to hold Eliquis for 48 hours and resume on 1/3. 5. Paroxysmal atrial fibrillation-his rate is well controlled. His apixaban is currently on hold for 48 hours.. 6. GERD-Protonix per GI. 7. Ascites - may need paracentesis, deferring to GI team.  8. Chronic blood loss anemia-likely secondary to upper GI bleeding-transfused 2 units PRBCs as ordered. Hg up to 9.4, now 8.7 and remaining stable.  Hold Eliquis until 04/18/2019. 9. Chronic cholecystitis-pthas a cholecystostomy tube in place whichhe says is indefinite. He has follow up with [facility]. 10. Hyperbilirubinemia - will follow with a.m. CMP 11. COPD-stable resume home bronchodilators. 12. Hyperlipidemia-resume home rosuvastatin.   DVT prophylaxis:SCDs Code Status: Full Family Communication: None at bedside Disposition Plan: Continue ongoing IV diuresis and plan to discharge home in the next 1-2 days if stable from hypoxemia standpoint.  Wears 2 L nasal cannula at home chronically.   Consultants:   GI  Cardiology  Procedures:   EDG on 04/15/19  Antimicrobials:  Anti-infectives (From admission, onward)   None       Subjective: Patient seen and evaluated today with no new acute complaints or concerns. No acute concerns or events noted overnight.  He continues to have some ongoing hypoxemia despite use of his 2 L nasal cannula oxygen.  No respiratory distress noted.  Objective: Vitals:   04/16/19 0441 04/16/19 0500 04/16/19 0600 04/16/19 0800  BP:   (!) 112/57 (!) 117/56   Pulse:  70 65   Resp:  15 18   Temp: 98 F (36.7 C)   99.1 F (37.3 C)  TempSrc: Oral   Oral  SpO2:  93% 92%   Weight:      Height:        Intake/Output Summary (Last 24 hours) at 04/16/2019 1021 Last data filed at 04/16/2019 0446 Gross per 24 hour  Intake -  Output 1650 ml  Net -1650 ml   Filed Weights   04/14/19 0500 04/15/19 0500 04/15/19 0851  Weight: 100 kg 100.6 kg 100.6 kg    Examination:  General exam: Appears calm and comfortable  Respiratory system: Clear to auscultation. Respiratory effort normal.  On 2 L nasal cannula oxygen. Cardiovascular system: S1 & S2 heard, RRR. No JVD, murmurs, rubs, gallops or clicks.  1+ pedal edema noted bilaterally. Gastrointestinal system: Abdomen is nondistended, soft and nontender. No organomegaly or masses felt. Normal bowel sounds heard. Central nervous system: Alert and oriented. No focal neurological deficits. Extremities: Symmetric 5 x 5 power. Skin: No rashes, lesions or ulcers Psychiatry: Judgement and insight appear normal. Mood & affect appropriate.     Data Reviewed: I have personally reviewed following labs and imaging studies  CBC: Recent Labs  Lab 04/13/19 0500 04/14/19 0111 04/14/19 0527 04/15/19 0442 04/16/19 0504  WBC 7.5  --  6.9 3.4* 2.3*  NEUTROABS 6.4  --  6.1 2.9 1.9  HGB 7.6* 9.5* 9.4* 8.8* 8.7*  HCT 26.0* 31.2* 30.8* 29.5* 29.3*  MCV 98.5  --  96.9 96.7 97.0  PLT 154  --  130* 114* 123XX123*   Basic Metabolic Panel: Recent Labs  Lab 04/13/19 0500 04/14/19 0527 04/15/19 0442 04/16/19 0836  NA 138 137 139 133*  K 3.7 3.9 3.9 3.7  CL 94* 96* 94* 91*  CO2 31 29 31 31   GLUCOSE 183* 190* 152* 221*  BUN 42* 42* 38* 35*  CREATININE 2.21* 2.04* 1.80* 1.75*  CALCIUM 8.6* 8.4* 8.8* 8.3*  MG  --  1.9 2.0  --    GFR: Estimated Creatinine Clearance: 40 mL/min (A) (by C-G formula based on SCr of 1.75 mg/dL (H)). Liver Function Tests: Recent Labs  Lab 04/13/19 0500 04/14/19  0527 04/15/19 0442  AST 37 39 25  ALT 18 35 26  ALKPHOS 171* 210* 189*  BILITOT 1.4* 2.9* 2.7*  PROT 6.8 6.7 6.5  ALBUMIN 3.3* 3.2* 3.0*   Recent Labs  Lab 04/13/19 0500  LIPASE 29   No results for input(s): AMMONIA in the last 168 hours. Coagulation Profile: No results for input(s): INR, PROTIME in the last 168 hours. Cardiac Enzymes: No results for input(s): CKTOTAL, CKMB, CKMBINDEX, TROPONINI in the last 168 hours. BNP (last 3 results) No results for input(s): PROBNP in the last 8760 hours. HbA1C: No results for input(s): HGBA1C in the last 72 hours. CBG: Recent Labs  Lab 04/15/19 1021 04/15/19 1145 04/15/19 1712 04/15/19 2030 04/16/19 0748  GLUCAP 170* 185* 145* 207* 185*   Lipid Profile: Recent Labs    04/13/19 1722  CHOL 77  HDL 23*  LDLCALC 36  TRIG 90  CHOLHDL 3.3   Thyroid Function Tests: Recent Labs    04/13/19 1722 04/14/19 0111  TSH 15.910*  --  FREET4  --  0.53*   Anemia Panel: Recent Labs    04/13/19 1426  FERRITIN 43  TIBC 455*  IRON 23*   Sepsis Labs: No results for input(s): PROCALCITON, LATICACIDVEN in the last 168 hours.  Recent Results (from the past 240 hour(s))  SARS CORONAVIRUS 2 (TAT 6-24 HRS) Nasopharyngeal Nasopharyngeal Swab     Status: None   Collection Time: 04/13/19 10:12 AM   Specimen: Nasopharyngeal Swab  Result Value Ref Range Status   SARS Coronavirus 2 NEGATIVE NEGATIVE Final    Comment: (NOTE) SARS-CoV-2 target nucleic acids are NOT DETECTED. The SARS-CoV-2 RNA is generally detectable in upper and lower respiratory specimens during the acute phase of infection. Negative results do not preclude SARS-CoV-2 infection, do not rule out co-infections with other pathogens, and should not be used as the sole basis for treatment or other patient management decisions. Negative results must be combined with clinical observations, patient history, and epidemiological information. The expected result is Negative.  Fact Sheet for Patients: SugarRoll.be Fact Sheet for Healthcare Providers: https://www.woods-mathews.com/ This test is not yet approved or cleared by the [place] FDA and  has been authorized for detection and/or diagnosis of SARS-CoV-2 by FDA under an Emergency Use Authorization (EUA). This EUA will remain  in effect (meaning this test can be used) for the duration of the COVID-19 declaration under Section 56 4(b)(1) of the Act, 21 U.S.C. section 360bbb-3(b)(1), unless the authorization is terminated or revoked sooner. Performed at [facility], [place] [address], [place], [place] [address]   MRSA PCR Screening     Status: None   Collection Time: 04/13/19  5:07 PM   Specimen: Nasal Mucosa; Nasopharyngeal  Result Value Ref Range Status   MRSA by PCR NEGATIVE NEGATIVE Final    Comment:        The GeneXpert MRSA Assay (FDA approved for NASAL specimens only), is one component of a comprehensive MRSA colonization surveillance program. It is not intended to diagnose MRSA infection nor to guide or monitor treatment for MRSA infections. Performed at [facility], [address], [place], [place] [address]          Radiology Studies: No results found.      Scheduled Meds: . Chlorhexidine Gluconate Cloth  6 each Topical Q0600  . citalopram  20 mg Oral Daily  . furosemide  40 mg Intravenous Q12H  . insulin aspart  0-5 Units Subcutaneous QHS  . insulin aspart  0-9 Units Subcutaneous TID WC  . insulin aspart  3 Units Subcutaneous TID WC  . levothyroxine  150 mcg Oral QAC breakfast  . lidocaine  5 mL Mouth/Throat Once  . mouth rinse  15 mL Mouth Rinse BID  . mometasone-formoterol  2 puff Inhalation BID  . multivitamin-lutein  1 capsule Oral Daily  . pantoprazole  40 mg Oral BID AC  . rosuvastatin  10 mg Oral q1800  . sodium chloride flush  3 mL Intravenous Q12H   Continuous Infusions: . sodium chloride    . sodium  chloride       LOS: 2 days    Time spent: 30 minutes    Brennyn Ortlieb Darleen Crocker, DO [facility] Pager [phone]  If 7PM-7AM, please contact night-coverage [email or web address] Password [facility] 04/16/2019, 10:21 AM

## 2019-04-16 NOTE — Plan of Care (Signed)
  Problem: Acute Rehab PT Goals(only PT should resolve) Goal: Pt Will Go Sit To Supine/Side Outcome: Progressing Flowsheets (Taken 04/16/2019 1232) Pt will go Sit to Supine/Side: with supervision Goal: Patient Will Transfer Sit To/From Stand Outcome: Progressing [place] (Taken 04/16/2019 1232) Patient will transfer sit to/from stand: with min guard assist Goal: Pt Will Transfer Bed To Chair/Chair To Bed Outcome: Progressing Flowsheets (Taken 04/16/2019 1232) Pt will Transfer Bed to Chair/Chair to Bed: min guard assist Goal: Pt Will Ambulate Outcome: Progressing Flowsheets (Taken 04/16/2019 1232) Pt will Ambulate:  25 feet  with min guard assist  with rolling walker  12:33 PM, 04/16/19 Mearl Latin PT, DPT Physical Therapist at [facility]

## 2019-04-16 NOTE — Care Management Important Message (Signed)
Important Message  Patient Details  Name: Matthew Campos MRN: [id number] Date of Birth: 01-25-1941   Medicare Important Message Given:  Yes     Tommy Medal 04/16/2019, 12:07 PM

## 2019-04-16 NOTE — Progress Notes (Signed)
   Assessment: ADMITTED WITH [facility]LE[facility] ON ELIQUIS/ASA AND NO PPI. EGD DEC 31 COMPLICATED BY HYPOXEMIA/PROLONGED SEDATION. PT REMAINS MILDLY CONFUSED AND CHOKING WHILE EATING.  PLAN: 1.  WILL ARRANGE SPEECH PATHOLOGY CONSULT AS AN OUTPT. 2.  DISCUSSED CLINICAL SITUATION WITH PT'S WIFE. WIFE INTERESTED IN ADDITIONAL WORKUP FOR ANEMIA. WILL ARRANGE FOR GI OPV AT [facility] FOR DYSPHAGIA/ANEMIA-OBSCURE GI BLEED AND TO COMPLETE EGD/?TCS. 3. PROTONIX BID 4. CONSIDER RISK V. BENEFITS OF ELIQUIS.  NO GI COVERAGE FROM JAN 1 5P-Apr 18 728.   Subjective: Since I last evaluated the patient HE IS ALERT AND INTERACTIVE. HIS WIFE REPORTS HE IS INTERMITTENTLY CONFUSED. NO BM SINCE ADMISSION. TOLERATING POs BUT GETS CHOKED AND STARTS TO COUGH WHEN EATING.  Objective: Vital signs in last 24 hours: Vitals:   04/16/19 0600 04/16/19 0800  BP: (!) 117/56   Pulse: 65   Resp: 18   Temp:  99.1 F (37.3 C)  SpO2: 92%    General appearance: alert, cooperative and no distress Resp: clear to auscultation bilaterally Cardio: irregularly irregular rhythm GI: soft, non-tender; bowel sounds normal; Neuro: oriented to year, month, president, unable to recall city, place, or day of the week.   Lab Results:  K 3.7 Cr 1.75 Hb 8.7 PLT CT 100  Studies/Results: No results found.  Medications: I have reviewed the patient's current medications.

## 2019-04-16 NOTE — TOC Initial Note (Signed)
Transition of Care [facility]) - Initial/Assessment Note    Patient Details  Name: Matthew Campos MRN: [id number] Date of Birth: 08-03-1940  Transition of [facility]) CM/SW Contact:    Lenell Mcconnell, Chauncey [place], RN Phone Number: 04/16/2019, 2:24 PM  Clinical Narrative:  GI bleed. From home with wife, reportedly has all necessary DME. Recommended for home health PT, agreeable , no preference on agency. Referred to [place] for PT.   Patient has home oxygen. Anticipate DC 04/17/19.                  Expected Discharge Plan: [place] Barriers to Discharge: Continued Medical Work up   Patient Goals and CMS Choice Patient states their goals for this hospitalization and ongoing recovery are:: return home CMS Medicare.gov Compare Post Acute Care list provided to:: Patient Choice offered to / list presented to : Patient  Expected Discharge Plan and Services Expected Discharge Plan: [place]   Discharge Planning Services: CM Consult Post Acute Care Choice: [place] arrangements for the past 2 months: [place]: PT [place]: [place] ([place]) Date [place]: 04/16/19 Time [place]: [id number] Representative spoke with at [place]: Vaughan Basta  Prior Living Arrangements/Services Living arrangements for the past 2 months: [place] Lives with:: Spouse   Do you feel safe going back to the place where you live?: Yes          Current home services: DME    Activities of Daily Living Home Assistive Devices/Equipment: Cane (specify quad or straight), Walker (specify type), Blood pressure cuff, CBG Meter, CPAP ADL Screening (condition at time of admission) Patient's cognitive ability adequate to safely complete daily activities?: Yes Is the patient deaf or have difficulty hearing?: No Does the patient have difficulty seeing, even when wearing  glasses/contacts?: No Does the patient have difficulty concentrating, remembering, or making decisions?: No Patient able to express need for assistance with ADLs?: Yes Does the patient have difficulty dressing or bathing?: No Independently performs ADLs?: Yes (appropriate for developmental age) Does the patient have difficulty walking or climbing stairs?: No Weakness of Legs: None Weakness of Arms/Hands: None         Emotional Assessment     Affect (typically observed): Accepting Orientation: : Oriented to Self, Oriented to Place, Oriented to  Time Alcohol / Substance Use: Not Applicable Psych Involvement: No (comment)  Admission diagnosis:  Bradycardia [R00.1] UGI bleed [K92.2] Chronic anticoagulation [Z79.01] Renal insufficiency [N28.9] Anemia due to blood loss [D50.0] Serum total bilirubin elevated [R17] AKI (acute kidney injury) ([place]) [N17.9] Gastrointestinal hemorrhage, unspecified gastrointestinal hemorrhage type [K92.2] Chest pain, unspecified type [R07.9] GI bleeding [K92.2] Patient Active Problem List   Diagnosis Date Noted  . GI bleeding 04/14/2019  . Acute on chronic diastolic heart failure ([place])   . AKI (acute kidney injury) ([place])   . Anemia due to blood loss   . Bradycardia   . Renal insufficiency   . UGI bleed 04/13/2019  . Chest pain 04/13/2019  . 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/13/2019  . Cholecystostomy care ([place]) 04/13/2019  . 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  . COPD (chronic obstructive pulmonary disease) ([place])   . Coronary artery disease   . Diabetes mellitus without complication ([place])   .  Chronic ischemic heart disease   . GERD (gastroesophageal reflux disease)   . Paroxysmal atrial fibrillation (HCC)   . Cholecystitis   . OSA (obstructive sleep apnea)   . Coagulopathy ([place])   . Chronic anticoagulation   . Dysphagia   . Elevated alkaline phosphatase measurement   . Elevated bilirubin    PCP:  System,  Pcp Not In Pharmacy:   [place], [place] [address] Phone: [phone] Fax: [phone]     Social Determinants of Health (SDOH) Interventions    Readmission Risk Interventions No flowsheet data found.

## 2019-04-16 NOTE — Evaluation (Signed)
Physical Therapy Evaluation Patient Details Name: Matthew Campos MRN: [id number] DOB: [birth year] Today's Date: 04/16/2019   History of Present Illness  Matthew Campos is a 78 y.o. 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  He says he had chest pain severe in the center part of the chest that woke him up from sleeping around 2:30 AM.  He took 4 baby aspirin and 3 sublingual nitroglycerin prior to EMS arrival.  He says that the pain resolved after that point.  He says that nothing seemed to make the pain worse as it was pain at rest.  He says it felt similar to when he had a heart attack in the past.  He had no shortness of breath or diaphoresis or nausea associated with this chest pain episode.  He says that overall the pain lasted for approximately 1 hour and then it resolved.  He is currently chest pain-free.  He says that he has been receiving most of his care at [facility] where he had his valve replacement and cholecystostomy.  He says that the tube is in place for an indefinite amount of time.  It does not have a bowel drain on it.  He is fully anticoagulated on apixaban.  In the ED he was noted to be anemic and was heme positive.    Clinical Impression  Patient limited for functional mobility as stated below secondary to BLE weakness, fatigue and poor standing balance. Patient able to transition to standing with use of RW and min assist. Patient tolerates marching at bedside with RW but is only able to achieve about 2 inches of foot clearance despite verbal cueing to lift  feet higher. He is able to ambulate a few small steps to chair with use of RW and min assist for balance and safety. Patient left in chair with nursing present.  Patient will benefit from continued physical therapy in hospital and recommended venue below to increase strength, balance, endurance for safe ADLs and gait.     Follow Up Recommendations Home health PT    Equipment Recommendations  3in1 (PT)    Recommendations for Other Services       Precautions / Restrictions Precautions Precautions: Fall Restrictions Weight Bearing Restrictions: No      Mobility  Bed Mobility Overal bed mobility: Needs Assistance Bed Mobility: Supine to Sit     Supine to sit: Min assist     General bed mobility comments: to transition to seated EOB  Transfers Overall transfer level: Needs assistance Equipment used: Rolling walker (2 wheeled) Transfers: Sit to/from [facility] Sit to Stand: Min assist Stand pivot transfers: Min assist       General transfer comment: using RW, verbal cueing for hand placement/sequencing  Ambulation/Gait Ambulation/Gait assistance: Min assist Gait Distance (Feet): 3 Feet Assistive device: Rolling walker (2 wheeled) Gait Pattern/deviations: Decreased step length - right;Decreased step length - left;Shuffle Gait velocity: decreased   General Gait Details: Patient able to take small steps at bedside ambulate to chair using RW, slow, labored  Stairs            Wheelchair Mobility    Modified Rankin (Stroke Patients  Only)       Balance Overall balance assessment: Needs assistance Sitting-balance support: No upper extremity supported;Feet supported Sitting balance-Leahy Scale: Good Sitting balance - Comments: seated EOB   Standing balance support: Bilateral upper extremity supported Standing balance-Leahy Scale: Fair Standing balance comment: using RW                             Pertinent Vitals/Pain Pain  Assessment: Faces Faces Pain Scale: No hurt    Home Living Family/patient expects to be discharged to:: Private residence Living Arrangements: Spouse/significant other Available Help at Discharge: Family;Available 24 hours/day Type of Home: House         Home Equipment: Walker - 2 wheels;Walker - 4 wheels;Cane - quad;Cane - single point;Bedside commode      Prior Function Level of Independence: Needs assistance   Gait / Transfers Assistance Needed: limited household ambulator with spc or RW  ADL's / Homemaking Assistance Needed: wife assists        Hand Dominance        Extremity/Trunk Assessment   Upper Extremity Assessment Upper Extremity Assessment: Overall WFL for tasks assessed    Lower Extremity Assessment Lower Extremity Assessment: Generalized weakness       Communication   Communication: No difficulties  Cognition Arousal/Alertness: Awake/alert Behavior During Therapy: WFL for tasks assessed/performed Overall Cognitive Status: Within Functional Limits for tasks assessed                                        General Comments      Exercises General Exercises - Lower Extremity Hip Flexion/Marching: AROM;Both;Standing;10 reps(using RW)   Assessment/Plan    PT Assessment Patient needs continued PT services  PT Problem List Decreased strength;Decreased activity tolerance;Decreased balance;Decreased mobility;Cardiopulmonary status limiting activity;Obesity       PT Treatment Interventions DME instruction;Gait training;Stair training;Functional mobility training;Therapeutic activities;Therapeutic exercise;Balance training;Neuromuscular re-education;Patient/family education;Manual techniques;Modalities    PT Goals (Current goals can be found in the Care Plan section)  Acute Rehab PT Goals Patient Stated Goal: return home with wife PT Goal Formulation: With patient/family Time For Goal Achievement: 04/30/19 Potential to Achieve  Goals: Good    Frequency Min 3X/week   Barriers to discharge        Co-evaluation               AM-PAC PT "6 Clicks" Mobility  Outcome Measure Help needed turning from your back to your side while in a flat bed without using bedrails?: A Little Help needed moving from lying on your back to sitting on the side of a flat bed without using bedrails?: A Little Help needed moving to and from a bed to a chair (including a wheelchair)?: A Little Help needed standing up from a chair using your arms (e.g., wheelchair or bedside chair)?: A Little Help needed to walk in hospital room?: A Little Help needed climbing 3-5 steps with a railing? : A Lot 6 Click Score: 17    End of Session Equipment Utilized During Treatment: Gait belt Activity Tolerance: Patient tolerated treatment well;Patient limited by fatigue Patient left: in chair;with family/visitor present;with call bell/phone within reach Nurse Communication: Mobility status PT Visit Diagnosis: Unsteadiness on feet (R26.81);Other abnormalities of gait and mobility (R26.89);Muscle weakness (generalized) (M62.81)    Time: 1132-1206 PT Time Calculation (min) (ACUTE ONLY): 34 min   Charges:  PT Evaluation $PT Eval Moderate Complexity: 1 Mod PT Treatments $Therapeutic Activity: 23-37 mins        12:30 PM, 04/16/19 Mearl Latin PT, DPT Physical Therapist at [facility]

## 2019-04-16 NOTE — Evaluation (Signed)
Clinical/Bedside Swallow Evaluation Patient Details  Name: Matthew Campos MRN: [id number] Date of Birth: [birth year]  Today's Date: 04/16/2019 Time: SLP Start Time (ACUTE ONLY): [id number] SLP Stop Time (ACUTE ONLY): [id number] SLP Time Calculation (min) (ACUTE ONLY): 17 min  Past Medical History:  Past Medical History:  Diagnosis Date  . Abdominal aortic aneurysm without rupture ([place])   . Anemia   . Anxiety and depression   . 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[place])   . CHF (congestive heart failure) ([place])   . Cholecystitis   . Chronic ischemic heart disease   . COPD (chronic obstructive pulmonary disease) ([place])   . Coronary artery disease   . Diabetes mellitus without complication ([place])   . GERD (gastroesophageal reflux disease)   . Hypertension   . Neoplasm of uncertain behavior of bladder   . Obesity   . OSA (obstructive sleep apnea)   . Pancytopenia ([place])   . Paroxysmal atrial fibrillation (H[facility])   . S/P TAVR (transcatheter aortic valve replacement)   . Severe aortic stenosis   . Sinus bradycardia    Past Surgical History:  Past Surgical History:  Procedure Laterality Date  . ABDOMINAL AORTIC ANEURYSM REPAIR     per patient stents placed about 4-5 years ago  . AORTIC VALVE REPLACEMENT     stated it was done in November  . cholecystostomy tube    . CORONARY ANGIOPLASTY WITH STENT PLACEMENT     about 30 years ago per patient  . TONSILLECTOMY     HPI:  Matthew Campos is a 79 y.o. 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  He says he had chest pain severe in the center part of the  chest that woke him up from sleeping around 2:30 AM.  He took 4 baby aspirin and 3 sublingual nitroglycerin prior to EMS arrival.  He says that he has been receiving most of his care at [facility] where he had his valve replacement and cholecystostomy.  He says that the tube is in place for an indefinite amount of time.  It does not have a bowel drain on it.  He is fully anticoagulated on apixaban.  In the ED he was noted to be anemic and was heme positive. This am Pt had a "choking" episode while eating and arguing with his wife. This reportedly happens "a lot"; ST ordered to evaluate swallowing.   Assessment / Plan / Recommendation Clinical Impression  Pt was administered clinical swallowing evaluation while seated upright in bed. Pt demonstrated no overt s/sx of aspiration with any consistencies or textures presented. However, Pt reports he "gets choked" often at home. Pt reports he gets choked when he "drinks too fast" and he also reports he does not always sit upright for PO. SLP reviewed universal aspiration precautions including the recommendation for pt to sit upright at 90 degrees for all PO and to avoid dry textures that typically cause him difficulty (pt reports coughing with dry crackers, corn bread and textures that have small pieces). Pt was further educated to utilize a slow consumption rate and to alternate bites and sips to facilitate moisture of textures consumed. There are no further ST needs  noted at this time. ST to sign off. Thank you for this referral. SLP Visit Diagnosis: Dysphagia, unspecified (R13.10)    Aspiration Risk  Mild aspiration risk    Diet Recommendation Dysphagia 3 (Mech soft);Thin liquid   Liquid Administration via: Cup;Straw Medication Administration: Whole meds with liquid Supervision: Patient able to self feed;Intermittent supervision to cue for compensatory strategies Compensations: Minimize environmental distractions;Slow rate;Small sips/bites;Follow  solids with liquid Postural Changes: Seated upright at 90 degrees;Remain upright for at least 30 minutes after po intake    Other  Recommendations Oral Care Recommendations: Oral care BID   Follow up Recommendations None        Swallow Study   General Date of Onset: 04/13/19 HPI: Matthew Campos is a 79 y.o. 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  He says he had chest pain severe in the center part of the chest that woke him up from sleeping around 2:30 AM.  He took 4 baby aspirin and 3 sublingual nitroglycerin prior to EMS arrival.  He says that he has been receiving most of his care at [facility] where he had his valve replacement and cholecystostomy.  He says that the tube is in place for an indefinite amount of time.  It does not have a bowel drain on it.  He is fully anticoagulated on apixaban.  In the ED he was noted to be anemic and was heme positive. This am Pt had a "choking" episode while eating and arguing with his wife. This reportedly happens "a lot"; ST ordered to evaluate swallowing. Type of Study: Bedside Swallow Evaluation Previous Swallow Assessment: none in chart Diet Prior to this Study: Dysphagia 3 (soft);Thin liquids Temperature Spikes Noted: No Respiratory Status: Nasal cannula History of Recent Intubation: No Behavior/Cognition: Alert;Cooperative;Pleasant mood Oral Cavity Assessment: Within Functional Limits Oral Care Completed by SLP: Recent completion by staff Oral Cavity - Dentition: Missing dentition Vision: Functional for  self-feeding Self-Feeding Abilities: Able to feed self Patient Positioning: Upright in bed Baseline Vocal Quality: Normal Volitional Cough: Strong Volitional Swallow: Able to elicit    Oral/Motor/Sensory Function Overall Oral Motor/Sensory Function: Within functional limits   Ice Chips Ice chips: Within functional limits   Thin Liquid Thin Liquid: Within functional limits    Nectar Thick Nectar Thick Liquid: Not tested   Honey Thick Honey Thick Liquid: Not tested   Puree Puree: Within functional limits   Solid     Solid: Within functional limits     Enola Siebers H. Roddie Mc, [place] Speech Language Pathologist  Wende Bushy 04/16/2019,3:41 PM

## 2019-04-17 LAB — GLUCOSE, CAPILLARY
Glucose-Capillary: 239 mg/dL — ABNORMAL HIGH (ref 70–99)
Glucose-Capillary: 199 mg/dL — ABNORMAL HIGH (ref 70–99)
Glucose-Capillary: 148 mg/dL — ABNORMAL HIGH (ref 70–99)
Glucose-Capillary: 242 mg/dL — ABNORMAL HIGH (ref 70–99)
Glucose-Capillary: 227 mg/dL — ABNORMAL HIGH (ref 70–99)
Glucose-Capillary: 220 mg/dL — ABNORMAL HIGH (ref 70–99)

## 2019-04-17 LAB — CBC
HCT: 27.9 % — ABNORMAL LOW (ref 39.0–52.0)
Hemoglobin: 8.4 g/dL — ABNORMAL LOW (ref 13.0–17.0)
MCH: 29.1 pg (ref 26.0–34.0)
MCHC: 30.1 g/dL (ref 30.0–36.0)
MCV: 96.5 fL (ref 80.0–100.0)
Platelets: 97 10*3/uL — ABNORMAL LOW (ref 150–400)
RBC: 2.89 MIL/uL — ABNORMAL LOW (ref 4.22–5.81)
RDW: 16 % — ABNORMAL HIGH (ref 11.5–15.5)
WBC: 2.4 10*3/uL — ABNORMAL LOW (ref 4.0–10.5)
nRBC: 0 % (ref 0.0–0.2)

## 2019-04-17 LAB — BASIC METABOLIC PANEL
Anion gap: 12 (ref 5–15)
BUN: 30 mg/dL — ABNORMAL HIGH (ref 8–23)
CO2: 32 mmol/L (ref 22–32)
Calcium: 8.4 mg/dL — ABNORMAL LOW (ref 8.9–10.3)
Chloride: 92 mmol/L — ABNORMAL LOW (ref 98–111)
Creatinine, Ser: 1.57 mg/dL — ABNORMAL HIGH (ref 0.61–1.24)
GFR calc Af Amer: 48 mL/min — ABNORMAL LOW (ref >60)
GFR calc non Af Amer: 42 mL/min — ABNORMAL LOW (ref >60)
Glucose, Bld: 173 mg/dL — ABNORMAL HIGH (ref 70–99)
Potassium: 3.5 mmol/L (ref 3.5–5.1)
Sodium: 136 mmol/L (ref 135–145)

## 2019-04-17 MED ORDER — POTASSIUM CHLORIDE CRYS ER 20 MEQ PO TBCR
40.0000 meq | EXTENDED_RELEASE_TABLET | Freq: Once | ORAL | Status: AC
  Administered 2019-04-17: 40 meq via ORAL
  Filled 2019-04-17: qty 2

## 2019-04-17 MED ORDER — IPRATROPIUM-ALBUTEROL 0.5-2.5 (3) MG/3ML IN SOLN
3.0000 mL | Freq: Four times a day (QID) | RESPIRATORY_TRACT | Status: DC | PRN
  Administered 2019-04-17 – 2019-04-18 (×4): 3 mL via RESPIRATORY_TRACT
  Filled 2019-04-17 (×4): qty 3

## 2019-04-17 NOTE — Progress Notes (Signed)
Patient ambulated with walker on 2L O2 via nasal cannula with sats between 87-91%. Patient denied feeling short of breath, any chest pain or discomfort. Dr Manuella Ghazi made aware.

## 2019-04-17 NOTE — Progress Notes (Signed)
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  1/1: Patient underwent EGD on 12/31 and was noted to have moderate gastritis with gastric polyps that were biopsied.  No active source of bleeding noted.  His hemoglobin remained stable this morning along with his vitals.  He continues to have some low pulse oximetry readings on his usual 2 L home nasal cannula.  We will plan to continue ongoing diuresis and recheck labs.  1/2: Patient has been assessed by PT with need for home health physical therapy on discharge.  I recommended that he withhold Eliquis until further evaluation by his cardiologist given high bleeding risk.  He has had speech evaluation with recommendations for dysphagia 3 diet.  He continues to have good urine output with 1.7 L overnight net negative balance and continues to have improvement in oxygenation.  He will still require at least 1 more day of IV diuresis until ready for discharge.  He is still noted to have desaturation with ambulation.  Assessment & Plan:   Active Problems:   UGI bleed   Chest pain   History of recent transcatheter aortic valve replacement (TAVR)   Cholecystostomy care (HCC)   Chronic blood loss anemia   COPD (chronic obstructive pulmonary disease) (HCC)   Coronary artery disease   Diabetes mellitus without complication (HCC)   Chronic ischemic heart disease   GERD (gastroesophageal reflux disease)  Paroxysmal atrial fibrillation (HCC)   Cholecystitis   OSA (obstructive sleep apnea)   Coagulopathy (HCC)   Chronic anticoagulation   Acute on chronic diastolic heart failure (HCC)   GI bleeding   AKI (acute kidney injury) ([place])   Anemia due to blood loss   Bradycardia   Renal insufficiency   1. Atypical chest pain in the setting of CAD.  Patient had mild nonspecific troponin elevation with no plans for ischemic testing at this time.  Recent cardiac catheterization 11/2018 at [facility] without obstructive disease. 2. Acute on chronic diastolic congestive heart failure exacerbation.  Continue IV Lasix today and monitor urine output.  Still has some mild hypoxemia that has been associated. 3. AKIon CKD stage 6 - reviewing old labs 01/2019 his creatinine was 1.4 and GFR was 45 at that time,hold diuretics, hold losartan, follow renal function panel closely. Renally dose medications.Baseline 1.5-2. 4. Moderate gastritis with gastric polyp status post biopsy 12/31.  No active bleeding noted on EGD on 12/31.  Plans to hold Eliquis for now until seen by cardiologist. 5. Paroxysmal atrial fibrillation-his rate is well controlled. His apixaban is currently on hold indefinitely for now. 6. GERD-Protonix  twice daily per GI. 7. Ascites - may need paracentesis, deferring to GI team.  8. Chronic blood loss anemia-likely secondary to upper GI bleeding-transfused 2 units PRBCs as ordered. Hg up to 9.4, now 8.7 and remaining stable.  Hold Eliquis. 9. Chronic cholecystitis-pthas a cholecystostomy tube in place whichhe says is indefinite. He has follow up with [facility]. 10. Hyperbilirubinemia - will follow with a.m. CMP 11. COPD-stable resume home bronchodilators. 12. Hyperlipidemia-resume home rosuvastatin.   DVT  prophylaxis:SCDs Code Status: Full Family Communication: None at bedside Disposition Plan: Continue ongoing IV diuresis and plan to discharge home in the next 1-2 days if stable from  hypoxemia standpoint.  Wears 2 L nasal cannula at home chronically.   Consultants:   GI  Cardiology  Procedures:   EGD on 04/15/19  Antimicrobials:  Anti-infectives (From admission, onward)   None       Subjective: Patient seen and evaluated today with no new acute complaints or concerns. No acute concerns or events noted overnight.  He is doing well on his 2 L oxygen and having good urine output.  He is eager to go home when able.  He still desaturates with ambulation however.  Objective: Vitals:   04/17/19 0600 04/17/19 0700 04/17/19 0735 04/17/19 0748  BP: 100/61 (!) 100/55    Pulse: (!) 51 (!) 51 (!) 59   Resp: (!) 21 (!) 23 16   Temp:   98.3 F (36.8 C)   TempSrc:   Oral   SpO2: 97% 97% 94% 95%  Weight:      Height:        Intake/Output Summary (Last 24 hours) at 04/17/2019 1042 Last data filed at 04/17/2019 0943 Gross per 24 hour  Intake 3 ml  Output 1750 ml  Net -1747 ml   Filed Weights   04/14/19 0500 04/15/19 0500 04/15/19 0851  Weight: 100 kg 100.6 kg 100.6 kg    Examination:  General exam: Appears calm and comfortable  Respiratory system: Clear to auscultation. Respiratory effort normal.  Currently on 2 L nasal cannula oxygen. Cardiovascular system: S1 & S2 heard, RRR. No JVD, murmurs, rubs, gallops or clicks. No pedal edema. Gastrointestinal system: Abdomen is nondistended, soft and nontender. No organomegaly or masses felt. Normal bowel sounds heard. Central nervous system: Alert and oriented. No focal neurological deficits. Extremities: Symmetric 5 x 5 power. Skin: No rashes, lesions or ulcers Psychiatry: Judgement and insight appear normal. Mood & affect appropriate.     Data Reviewed: I have personally reviewed following labs and imaging studies  CBC: Recent Labs  Lab 04/13/19 0500 04/14/19 0111 04/14/19 0527 04/15/19 0442 04/16/19 0504 04/17/19 0439  WBC 7.5  --  6.9 3.4* 2.3* 2.4*  NEUTROABS 6.4  --  6.1 2.9 1.9  --   HGB  7.6* 9.5* 9.4* 8.8* 8.7* 8.4*  HCT 26.0* 31.2* 30.8* 29.5* 29.3* 27.9*  MCV 98.5  --  96.9 96.7 97.0 96.5  PLT 154  --  130* 114* 100* 97*   Basic Metabolic Panel: Recent Labs  Lab 04/13/19 0500 04/14/19 0527 04/15/19 0442 04/16/19 0836 04/17/19 0439  NA 138 137 139 133* 136  K 3.7 3.9 3.9 3.7 3.5  CL 94* 96* 94* 91* 92*  CO2 31 29 31 31  32  GLUCOSE 183* 190* 152* 221* 173*  BUN 42* 42* 38* 35* 30*  CREATININE 2.21* 2.04* 1.80* 1.75* 1.57*  CALCIUM 8.6* 8.4* 8.8* 8.3* 8.4*  MG  --  1.9 2.0  --   --    GFR: Estimated Creatinine Clearance: 44.6 mL/min (A) (by C-G formula based on SCr of 1.57 mg/dL (H)). Liver Function Tests: Recent Labs  Lab 04/13/19 0500 04/14/19 0527 04/15/19 0442  AST 37 39 25  ALT 18 35 26  ALKPHOS 171* 210* 189*  BILITOT 1.4* 2.9* 2.7*  PROT 6.8 6.7 6.5  ALBUMIN 3.3* 3.2* 3.0*   Recent Labs  Lab 04/13/19 0500  LIPASE 29   No results for input(s): AMMONIA in  the last 168 hours. Coagulation Profile: No results for input(s): INR, PROTIME in the last 168 hours. Cardiac Enzymes: No results for input(s): CKTOTAL, CKMB, CKMBINDEX, TROPONINI in the last 168 hours. BNP (last 3 results) No results for input(s): PROBNP in the last 8760 hours. HbA1C: No results for input(s): HGBA1C in the last 72 hours. CBG: Recent Labs  Lab 04/16/19 0748 04/16/19 1120 04/16/19 1620 04/16/19 2045 04/17/19 0731  GLUCAP 185* 258* 239* 199* 148*   Lipid Profile: No results for input(s): CHOL, HDL, LDLCALC, TRIG, CHOLHDL, LDLDIRECT in the last 72 hours. Thyroid Function Tests: No results for input(s): TSH, T4TOTAL, FREET4, T3FREE, THYROIDAB in the last 72 hours. Anemia Panel: No results for input(s): VITAMINB12, FOLATE, FERRITIN, TIBC, IRON, RETICCTPCT in the last 72 hours. Sepsis Labs: No results for input(s): PROCALCITON, LATICACIDVEN in the last 168 hours.  Recent Results (from the past 240 hour(s))  SARS CORONAVIRUS 2 (TAT 6-24 HRS) Nasopharyngeal  Nasopharyngeal Swab     Status: None   Collection Time: 04/13/19 10:12 AM   Specimen: Nasopharyngeal Swab  Result Value Ref Range Status   SARS Coronavirus 2 NEGATIVE NEGATIVE Final    Comment: (NOTE) SARS-CoV-2 target nucleic acids are NOT DETECTED. The SARS-CoV-2 RNA is generally detectable in upper and lower respiratory specimens during the acute phase of infection. Negative results do not preclude SARS-CoV-2 infection, do not rule out co-infections with other pathogens, and should not be used as the sole basis for treatment or other patient management decisions. Negative results must be combined with clinical observations, patient history, and epidemiological information. The expected result is Negative. Fact Sheet for Patients: SugarRoll.be Fact Sheet for Healthcare Providers: https://www.woods-mathews.com/ This test is not yet approved or cleared by the [place] FDA and  has been authorized for detection and/or diagnosis of SARS-CoV-2 by FDA under an Emergency Use Authorization (EUA). This EUA will remain  in effect (meaning this test can be used) for the duration of the COVID-19 declaration under Section 56 4(b)(1) of the Act, 21 U.S.C. section 360bbb-3(b)(1), unless the authorization is terminated or revoked sooner. Performed at [facility], [place] [address], [place], [place] [address]   MRSA PCR Screening     Status: None   Collection Time: 04/13/19  5:07 PM   Specimen: Nasal Mucosa; Nasopharyngeal  Result Value Ref Range Status   MRSA by PCR NEGATIVE NEGATIVE Final    Comment:        The GeneXpert MRSA Assay (FDA approved for NASAL specimens only), is one component of a comprehensive MRSA colonization surveillance program. It is not intended to diagnose MRSA infection nor to guide or monitor treatment for MRSA infections. Performed at [facility], [address] [place], [place] [address]           Radiology Studies: No results found.      Scheduled Meds: . Chlorhexidine Gluconate Cloth  6 each Topical Q0600  . citalopram  20 mg Oral Daily  . furosemide  40 mg Intravenous Q12H  . insulin aspart  0-5 Units Subcutaneous QHS  . insulin aspart  0-9 Units Subcutaneous TID WC  . insulin aspart  3 Units Subcutaneous TID WC  . levothyroxine  150 mcg Oral QAC breakfast  . lidocaine  5 mL Mouth/Throat Once  . mouth rinse  15 mL Mouth Rinse BID  . mometasone-formoterol  2 puff Inhalation BID  . multivitamin-lutein  1 capsule Oral Daily  . pantoprazole  40 mg Oral BID AC  . potassium chloride  40 mEq Oral Once  . rosuvastatin  10 mg Oral q1800  . sodium chloride flush  3 mL Intravenous Q12H   Continuous Infusions: . sodium chloride       LOS: 3 days    Time spent: 30 minutes    Pratik Darleen Crocker, DO [facility] Pager [phone]  If 7PM-7AM, please contact night-coverage [email or web address] Password [facility] 04/17/2019, 10:42 AM

## 2019-04-18 LAB — CBC
HCT: 28.9 % — ABNORMAL LOW (ref 39.0–52.0)
Hemoglobin: 8.6 g/dL — ABNORMAL LOW (ref 13.0–17.0)
MCH: 28.5 pg (ref 26.0–34.0)
MCHC: 29.8 g/dL — ABNORMAL LOW (ref 30.0–36.0)
MCV: 95.7 fL (ref 80.0–100.0)
Platelets: 105 10*3/uL — ABNORMAL LOW (ref 150–400)
RBC: 3.02 MIL/uL — ABNORMAL LOW (ref 4.22–5.81)
RDW: 16 % — ABNORMAL HIGH (ref 11.5–15.5)
WBC: 2.9 10*3/uL — ABNORMAL LOW (ref 4.0–10.5)
nRBC: 0 % (ref 0.0–0.2)

## 2019-04-18 LAB — BASIC METABOLIC PANEL
Anion gap: 10 (ref 5–15)
BUN: 28 mg/dL — ABNORMAL HIGH (ref 8–23)
CO2: 35 mmol/L — ABNORMAL HIGH (ref 22–32)
Calcium: 8.7 mg/dL — ABNORMAL LOW (ref 8.9–10.3)
Chloride: 93 mmol/L — ABNORMAL LOW (ref 98–111)
Creatinine, Ser: 1.32 mg/dL — ABNORMAL HIGH (ref 0.61–1.24)
GFR calc Af Amer: 59 mL/min — ABNORMAL LOW (ref >60)
GFR calc non Af Amer: 51 mL/min — ABNORMAL LOW (ref >60)
Glucose, Bld: 151 mg/dL — ABNORMAL HIGH (ref 70–99)
Potassium: 3.6 mmol/L (ref 3.5–5.1)
Sodium: 138 mmol/L (ref 135–145)

## 2019-04-18 LAB — GLUCOSE, CAPILLARY
Glucose-Capillary: 194 mg/dL — ABNORMAL HIGH (ref 70–99)
Glucose-Capillary: 363 mg/dL — ABNORMAL HIGH (ref 70–99)
Glucose-Capillary: 190 mg/dL — ABNORMAL HIGH (ref 70–99)
Glucose-Capillary: 124 mg/dL — ABNORMAL HIGH (ref 70–99)

## 2019-04-18 LAB — MAGNESIUM: Magnesium: 2 mg/dL (ref 1.7–2.4)

## 2019-04-18 MED ORDER — ALUM & MAG HYDROXIDE-SIMETH 200-200-20 MG/5ML PO SUSP
30.0000 mL | ORAL | Status: DC | PRN
  Administered 2019-04-18: 30 mL via ORAL
  Filled 2019-04-18: qty 30

## 2019-04-18 MED ORDER — SODIUM CHLORIDE 0.9 % IV SOLN
1.0000 g | INTRAVENOUS | Status: DC
  Administered 2019-04-18: 1 g via INTRAVENOUS
  Filled 2019-04-18: qty 10

## 2019-04-18 MED ORDER — POTASSIUM CHLORIDE CRYS ER 20 MEQ PO TBCR
40.0000 meq | EXTENDED_RELEASE_TABLET | Freq: Once | ORAL | Status: AC
  Administered 2019-04-18: 40 meq via ORAL
  Filled 2019-04-18: qty 2

## 2019-04-18 NOTE — Progress Notes (Signed)
SATURATION QUALIFICATIONS: (This note is used to comply with regulatory documentation for home oxygen)  Patient Saturations on 2 Liters of oxygen at Rest = 94%  Patient Saturations on 2 Liters of oxygen while Ambulating = 90%  (ambulated 20 ft.)  Please briefly explain why patient needs home oxygen:  Pt on chronic oxygen, desats with mild exertion.

## 2019-04-18 NOTE — Progress Notes (Signed)
PROGRESS NOTE    Matthew Campos  [id number] DOB: 23-Jan-1941 DOA: 04/13/2019 PCP: System, Pcp Not In   Brief Narrative:  79 y.o.malewith medical history significant ofcongestive heart failure, coronary artery disease, severe aortic stenosiss/p recent TAVR,chronic cholecystitis with cholecystostomy tube in place, chronic anemia, chronic anticoagulation on apixaban for paroxysmal atrial fibrillation, OSA, pancytopenia, type 2 diabetes mellitus, hypertension who was brought into the emergency department by EMS planing of chest pain that started approximately 1 hour prior to arrival.  1/1:Patient underwent EGD on 12/31 and was noted to have moderate gastritis with gastric polyps that were biopsied. No active source of bleeding noted. His hemoglobin remained stable this morning along with his vitals. He continues to have some low pulse oximetry readings on his usual 2 L home nasal cannula. We will plan to continue ongoing diuresis and recheck labs.  1/2: Patient has been assessed by PT with need for home health physical therapy on discharge.  I recommended that he withhold Eliquis until further evaluation by his cardiologist given high bleeding risk.  He has had speech evaluation with recommendations for dysphagia 3 diet.  He continues to have good urine output with 1.7 L overnight net negative balance and continues to have improvement in oxygenation.  He will still require at least 1 more day of IV diuresis until ready for discharge.  He is still noted to have desaturation with ambulation.  1/3: Patient was eager and anticipating discharge this morning, but he continues to diurese quite well with negative fluid balance of 1.86 L overnight.  His creatinine is downtrending as well.  He has no further bleeding noted and will remain off Eliquis until further seen by his cardiologist.  He is having some leakage of cholecystostomy tube with some swelling underneath the area.  Will obtain  abdominal ultrasound to further assess that region for any fluid collection and consult with general surgery as needed.  He also appears to have some local cellulitis for which Rocephin will be initiated.  Assessment & Plan:   Active Problems:   UGI bleed   Chest pain   History of recent transcatheter aortic valve replacement (TAVR)   Cholecystostomy care (HCC)   Chronic blood loss anemia   COPD (chronic obstructive pulmonary disease) (HCC)   Coronary artery disease   Diabetes mellitus without complication (HCC)   Chronic ischemic heart disease   GERD (gastroesophageal reflux disease)   Paroxysmal atrial fibrillation (HCC)   Cholecystitis   OSA (obstructive sleep apnea)   Coagulopathy (HCC)   Chronic anticoagulation   Acute on chronic diastolic heart failure (HCC)   GI bleeding   AKI (acute kidney injury) ([place])   Anemia due to blood loss   Bradycardia   Renal insufficiency   1. Atypical chest pain in the setting of CAD. Patient had mild nonspecific troponin elevation with no plans for ischemic testing at this time. Recent cardiac catheterization 11/2018 at [facility] without obstructive disease. 2. Acute on chronic diastolic congestive heart failure exacerbation. Continue IV Lasix today and monitor urine output. Still has some mild hypoxemia that has been associated, but is currently back to baseline. 3. Cellulitis at cholecystostomy tube site with drainage.  Evaluate further with abdominal ultrasound to the area and start Rocephin empirically for treatment.  Continue diuresis which should help his abdominal distention as well. 4. 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  Renally dose medications.Baseline 1.5-2. 5. Moderate gastritis with gastric polyp status post biopsy 12/31. No active bleeding noted on EGD on 12/31. Plans to hold Eliquis for now  until seen by cardiologist at [facility]. 6. Paroxysmal atrial fibrillation-his rate is well controlled. His apixabanis currently on hold indefinitely for now. 7. GERD-Protonix twice daily per GI. 8. Ascites - may need paracentesis, deferring to GI team.  9. Chronic blood loss anemia-likely secondary to upper GI bleeding-transfused 2 units PRBCs as ordered. Hg up to 9.4, now 8.7and remaining stable. Hold Eliquis indefinitely for now. 10. Chronic cholecystitis-pthas a cholecystostomy tube in place whichhe says is indefinite. He has follow up with [facility]. 11. Hyperbilirubinemia - will followwith a.m. CMP 12. COPD-stable resume home bronchodilators. 13. Hyperlipidemia-resume home rosuvastatin.   DVT prophylaxis:SCDs Code Status:Full Family Communication:None at bedside Disposition Plan:Continue ongoing IV diuresis and check ultrasound of abdomen around cholecystostomy tube site.  Start Rocephin empirically.  Currently on 2 L nasal cannula which is his usual baseline.   Consultants:  GI  Cardiology  Procedures:  EGD on 04/15/19  Antimicrobials:  Anti-infectives (From admission, onward)   Start     Dose/Rate Route Frequency Ordered Stop   04/18/19 1215  cefTRIAXone (ROCEPHIN) 1 g in sodium chloride 0.9 % 100 mL IVPB     1 g 200 mL/hr over 30 Minutes Intravenous Every 24 hours 04/18/19 1214         Subjective: Patient seen and evaluated today with no new acute complaints or concerns. No acute concerns or events noted overnight.  He is eager to go home and remains on his usual 2 L nasal cannula oxygen which is at baseline for him.  He is noted to have some foul drainage around cholecystostomy site.  Objective: Vitals:   04/18/19 0500 04/18/19 0750 04/18/19 0843 04/18/19 1141  BP:      Pulse:  61    Resp:  (!) 22    Temp: 98.1 F (36.7 C) 98 F (36.7 C)  98 F (36.7 C)  TempSrc: Oral Oral  Oral  SpO2:  96% 94%   Weight: 98.2 kg     Height:         Intake/Output Summary (Last 24 hours) at 04/18/2019 1215 Last data filed at 04/18/2019 1200 Gross per 24 hour  Intake 483 ml  Output 3077 ml  Net -2594 ml   Filed Weights   04/15/19 0500 04/15/19 0851 04/18/19 0500  Weight: 100.6 kg 100.6 kg 98.2 kg    Examination:  General exam: Appears calm and comfortable  Respiratory system: Clear to auscultation. Respiratory effort normal.  Currently on 2 L nasal cannula oxygen. Cardiovascular system: S1 & S2 heard, RRR. No JVD, murmurs, rubs, gallops or clicks. No pedal edema. Gastrointestinal system: Abdomen is nondistended, soft and nontender. No organomegaly or masses felt. Normal bowel sounds heard.  Cholecystostomy site with some mild erythema, no tenderness and minimal warmth noted.  Foul-smelling discharge noted earlier. Central nervous system: Alert and oriented. No focal neurological deficits. Extremities: Symmetric 5 x 5 power. Skin: No rashes, lesions or ulcers Psychiatry: Judgement and insight appear normal. Mood & affect appropriate.     Data Reviewed: I have personally reviewed following labs and imaging studies  CBC: Recent Labs  Lab 04/13/19 0500 04/14/19 0527 04/15/19 0442 04/16/19 0504 04/17/19 0439 04/18/19 0449  WBC 7.5 6.9 3.4* 2.3* 2.4* 2.9*  NEUTROABS 6.4 6.1 2.9 1.9  --   --   HGB 7.6* 9.4* 8.8* 8.7* 8.4* 8.6*  HCT 26.0* 30.8* 29.5* 29.3* 27.9* 28.9*  MCV 98.5 96.9 96.7 97.0 96.5 95.7  PLT 154 130* 114* 100* 97* [id number]*   Basic Metabolic Panel: Recent Labs  Lab 04/14/19 0527 04/15/19 0442 04/16/19 0836 04/17/19 0439 04/18/19 0449  NA 137 139 133* 136 138  K 3.9 3.9 3.7 3.5 3.6  CL 96* 94* 91* 92* 93*  CO2 29 31 31  32 35*  GLUCOSE 190* 152* 221* 173* 151*  BUN 42* 38* 35* 30* 28*  CREATININE 2.04* 1.80* 1.75* 1.57* 1.32*  CALCIUM 8.4* 8.8* 8.3* 8.4* 8.7*  MG 1.9 2.0  --   --  2.0   GFR: Estimated Creatinine Clearance: 52.4 mL/min (A) (by C-G formula based on SCr of 1.32 mg/dL (H)). Liver  Function Tests: Recent Labs  Lab 04/13/19 0500 04/14/19 0527 04/15/19 0442  AST 37 39 25  ALT 18 35 26  ALKPHOS 171* 210* 189*  BILITOT 1.4* 2.9* 2.7*  PROT 6.8 6.7 6.5  ALBUMIN 3.3* 3.2* 3.0*   Recent Labs  Lab 04/13/19 0500  LIPASE 29   No results for input(s): AMMONIA in the last 168 hours. Coagulation Profile: No results for input(s): INR, PROTIME in the last 168 hours. Cardiac Enzymes: No results for input(s): CKTOTAL, CKMB, CKMBINDEX, TROPONINI in the last 168 hours. BNP (last 3 results) No results for input(s): PROBNP in the last 8760 hours. HbA1C: No results for input(s): HGBA1C in the last 72 hours. CBG: Recent Labs  Lab 04/17/19 1131 04/17/19 1638 04/17/19 2113 04/18/19 0751 04/18/19 1140  GLUCAP 242* 227* 220* 194* 363*   Lipid Profile: No results for input(s): CHOL, HDL, LDLCALC, TRIG, CHOLHDL, LDLDIRECT in the last 72 hours. Thyroid Function Tests: No results for input(s): TSH, T4TOTAL, FREET4, T3FREE, THYROIDAB in the last 72 hours. Anemia Panel: No results for input(s): VITAMINB12, FOLATE, FERRITIN, TIBC, IRON, RETICCTPCT in the last 72 hours. Sepsis Labs: No results for input(s): PROCALCITON, LATICACIDVEN in the last 168 hours.  Recent Results (from the past 240 hour(s))  SARS CORONAVIRUS 2 (TAT 6-24 HRS) Nasopharyngeal Nasopharyngeal Swab     Status: None   Collection Time: 04/13/19 10:12 AM   Specimen: Nasopharyngeal Swab  Result Value Ref Range Status   SARS Coronavirus 2 NEGATIVE NEGATIVE Final    Comment: (NOTE) SARS-CoV-2 target nucleic acids are NOT DETECTED. The SARS-CoV-2 RNA is generally detectable in upper and lower respiratory specimens during the acute phase of infection. Negative results do not preclude SARS-CoV-2 infection, do not rule out co-infections with other pathogens, and should not be used as the sole basis for treatment or other patient management decisions. Negative results must be combined with clinical  observations, patient history, and epidemiological information. The expected result is Negative. Fact Sheet for Patients: SugarRoll.be Fact Sheet for Healthcare Providers: https://www.woods-mathews.com/ This test is not yet approved or cleared by the [place] FDA and  has been authorized for detection and/or diagnosis of SARS-CoV-2 by FDA under an Emergency Use Authorization (EUA). This EUA will remain  in effect (meaning this test can be used) for the duration of the COVID-19 declaration under Section 56 4(b)(1) of the Act, 21 U.S.C. section 360bbb-3(b)(1), unless the authorization is terminated or revoked sooner. Performed at [facility], [place] [address], [place], [place] [address]   MRSA PCR Screening     Status: None   Collection Time: 04/13/19  5:07 PM   Specimen: Nasal Mucosa; Nasopharyngeal  Result Value Ref Range Status   MRSA by PCR NEGATIVE NEGATIVE Final  Comment:        The GeneXpert MRSA Assay (FDA approved for NASAL specimens only), is one component of a comprehensive MRSA colonization surveillance program. It is not intended to diagnose MRSA infection nor to guide or monitor treatment for MRSA infections. Performed at [facility], [address] [place], [place] [address]          Radiology Studies: No results found.      Scheduled Meds: . Chlorhexidine Gluconate Cloth  6 each Topical Q0600  . citalopram  20 mg Oral Daily  . furosemide  40 mg Intravenous Q12H  . insulin aspart  0-5 Units Subcutaneous QHS  . insulin aspart  0-9 Units Subcutaneous TID WC  . insulin aspart  3 Units Subcutaneous TID WC  . levothyroxine  150 mcg Oral QAC breakfast  . lidocaine  5 mL Mouth/Throat Once  . mouth rinse  15 mL Mouth Rinse BID  . mometasone-formoterol  2 puff Inhalation BID  . multivitamin-lutein  1 capsule Oral Daily  . pantoprazole  40 mg Oral BID AC  . potassium chloride  40 mEq Oral Once  .  rosuvastatin  10 mg Oral q1800  . sodium chloride flush  3 mL Intravenous Q12H   Continuous Infusions: . sodium chloride    . cefTRIAXone (ROCEPHIN)  IV       LOS: 4 days    Time spent: 30 minutes    Jjesus Dingley Darleen Crocker, DO [facility] Pager [phone]  If 7PM-7AM, please contact night-coverage [email or web address] Password [facility] 04/18/2019, 12:15 PM

## 2019-04-19 ENCOUNTER — Telehealth: Payer: Self-pay | Admitting: Gastroenterology

## 2019-04-19 ENCOUNTER — Inpatient Hospital Stay (HOSPITAL_COMMUNITY): Payer: Medicare HMO

## 2019-04-19 DIAGNOSIS — K922 Gastrointestinal hemorrhage, unspecified: Secondary | ICD-10-CM

## 2019-04-19 DIAGNOSIS — R1310 Dysphagia, unspecified: Secondary | ICD-10-CM

## 2019-04-19 LAB — CBC
HCT: 29.7 % — ABNORMAL LOW (ref 39.0–52.0)
Hemoglobin: 8.9 g/dL — ABNORMAL LOW (ref 13.0–17.0)
MCH: 28.8 pg (ref 26.0–34.0)
MCHC: 30 g/dL (ref 30.0–36.0)
MCV: 96.1 fL (ref 80.0–100.0)
Platelets: 138 10*3/uL — ABNORMAL LOW (ref 150–400)
RBC: 3.09 MIL/uL — ABNORMAL LOW (ref 4.22–5.81)
RDW: 16.3 % — ABNORMAL HIGH (ref 11.5–15.5)
WBC: 3.6 10*3/uL — ABNORMAL LOW (ref 4.0–10.5)
nRBC: 0 % (ref 0.0–0.2)

## 2019-04-19 LAB — BASIC METABOLIC PANEL
Anion gap: 12 (ref 5–15)
BUN: 25 mg/dL — ABNORMAL HIGH (ref 8–23)
CO2: 35 mmol/L — ABNORMAL HIGH (ref 22–32)
Calcium: 8.7 mg/dL — ABNORMAL LOW (ref 8.9–10.3)
Chloride: 93 mmol/L — ABNORMAL LOW (ref 98–111)
Creatinine, Ser: 1.24 mg/dL (ref 0.61–1.24)
GFR calc Af Amer: >60 mL/min (ref >60)
GFR calc non Af Amer: 55 mL/min — ABNORMAL LOW (ref >60)
Glucose, Bld: 164 mg/dL — ABNORMAL HIGH (ref 70–99)
Potassium: 3.6 mmol/L (ref 3.5–5.1)
Sodium: 140 mmol/L (ref 135–145)

## 2019-04-19 LAB — MAGNESIUM: Magnesium: 2 mg/dL (ref 1.7–2.4)

## 2019-04-19 LAB — GLUCOSE, CAPILLARY: Glucose-Capillary: 241 mg/dL — ABNORMAL HIGH (ref 70–99)

## 2019-04-19 MED ORDER — PANTOPRAZOLE SODIUM 40 MG PO TBEC: 40.0000 mg | DELAYED_RELEASE_TABLET | Freq: Two times a day (BID) | ORAL | 0 refills | Status: DC

## 2019-04-19 MED ORDER — CEPHALEXIN 500 MG PO CAPS: 500.0000 mg | ORAL_CAPSULE | Freq: Four times a day (QID) | ORAL | 0 refills | Status: AC

## 2019-04-19 MED ORDER — LEVOTHYROXINE SODIUM 150 MCG PO TABS: 150.0000 ug | ORAL_TABLET | Freq: Every day | ORAL | 3 refills | Status: DC

## 2019-04-19 NOTE — Discharge Summary (Signed)
Physician Discharge Summary  EWEL IKNER [id number] DOB: Jul 24, 1940 DOA: 04/13/2019  PCP: System, Pcp Not In  Admit date: 04/13/2019  Discharge date: 04/19/2019  Admitted From:Home  Disposition:  Home  Recommendations for Outpatient Follow-up:  1. Follow up with PCP in 1-2 weeks 2. Follow-up with providers at [facility] with close follow-up to cardiology as well as the surgeons already scheduled 3. Follow-up with GI at [facility] which will be scheduled as well 4. Continue on medications as noted below and complete course of Keflex for cellulitis of abdomen 5. 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  1/1:Patient underwent EGD on 12/31 and was noted to have moderate gastritis with gastric polyps that were biopsied. No active source of bleeding noted. His hemoglobin remained stable this morning along with his vitals. He continues to have some low pulse oximetry readings on his usual 2 L home nasal cannula. We will plan to continue ongoing diuresis and recheck labs.  1/2:Patient has been assessed by PT with need for home health physical therapy on discharge. I recommended that he withhold  Eliquis until further evaluation by his cardiologist given high bleeding risk. He has had speech evaluation with recommendations for dysphagia 3 diet. He continues to have good urine output with 1.7 L overnight net negative balance and continues to have improvement in oxygenation. He will still require at least 1 more day of IV diuresis until ready for discharge. He is still noted to have desaturation with ambulation.  1/3: Patient was eager and anticipating discharge this morning, but he continues to diurese quite well with negative fluid balance of 1.86 L overnight.  His creatinine is downtrending as well.  He has no further bleeding noted and will remain off Eliquis until further seen by his cardiologist.  He is having some leakage of cholecystostomy tube with some swelling underneath the area.  Will obtain abdominal ultrasound to further assess that region for any fluid collection and consult with general surgery as needed.  He also appears to have some local cellulitis for which Rocephin will be initiated.  1/4: Patient is ready for discharge today.  Ultrasound of the abdomen around cholecystostomy site demonstrates that the drain is still in the gallbladder and that there is no fluid collection around the site.  No further drainage noted.  He will be discharged on Keflex to complete course of treatment over the next 10 days.  He is also been advised to remain off Eliquis for now until further discussion with his cardiologist and to remain on Protonix twice daily as otherwise prescribed.  He will be discharged to home health with physical therapy and has close follow-up with his physicians at [facility].  No other acute events noted throughout the course of this admission.  Discharge Diagnoses:  Active Problems:   UGI bleed   Chest pain   History  of recent transcatheter aortic valve replacement (TAVR)   Cholecystostomy care ([facility])   Chronic blood loss anemia   COPD (chronic obstructive  pulmonary disease) (HCC)   Coronary artery disease   Diabetes mellitus without complication (HCC)   Chronic ischemic heart disease   GERD (gastroesophageal reflux disease)   Paroxysmal atrial fibrillation (HCC)   Cholecystitis   OSA (obstructive sleep apnea)   Coagulopathy (HCC)   Chronic anticoagulation   Acute on chronic diastolic heart failure (HCC)   GI bleeding   AKI (acute kidney injury) ([place])   Anemia due to blood loss   Bradycardia   Renal insufficiency  Principal discharge diagnosis: Atypical chest pain with history of CAD in the setting of acute on chronic diastolic congestive heart failure exacerbation along with AKI on CKD stage III.  He was also noted to have moderate gastritis for which Eliquis has now been held.  Discharge Instructions  Discharge Instructions    Diet - low sodium heart healthy   Complete by: As directed    Increase activity slowly   Complete by: As directed      Allergies as of 04/19/2019      Reactions   Codeine    Lisinopril       Medication List    STOP taking these medications   Eliquis 5 MG Tabs tablet Generic drug: apixaban   losartan 100 MG tablet Commonly known as: COZAAR   metoprolol tartrate 50 MG tablet Commonly known as: LOPRESSOR   omeprazole 40 MG capsule Commonly known as: PRILOSEC     TAKE these medications   albuterol (2.5 MG/3ML) 0.083% nebulizer solution Commonly known as: PROVENTIL Take 2.5 mg by nebulization every 6 (six) hours as needed for wheezing or shortness of breath.   albuterol 108 (90 Base) MCG/ACT inhaler Commonly known as: VENTOLIN HFA Inhale 2 puffs into the lungs every 6 (six) hours as needed. For shortness of breath.   aspirin EC 81 MG tablet Take 81 mg by mouth daily.   Calcium Antacid 500 MG chewable tablet Generic drug: calcium carbonate Chew 1 tablet by mouth 3 (three) times daily as needed.   cephALEXin 500 MG capsule Commonly known as: KEFLEX Take 1 capsule (500 mg total) by  mouth 4 (four) times daily for 10 days.   escitalopram 10 MG tablet Commonly known as: LEXAPRO Take 10 mg by mouth daily.   levothyroxine 150 MCG tablet Commonly known as: SYNTHROID Take 1 tablet (150 mcg total) by mouth daily before breakfast. Start taking on: April 20, 2019   LORazepam 0.5 MG tablet Commonly known as: ATIVAN Take 1 tablet by mouth at bedtime as needed.   multivitamin capsule Take 1 capsule by mouth daily.   nitroGLYCERIN 0.4 MG SL tablet Commonly known as: NITROSTAT Place 0.4 mg under the tongue every 5 (five) minutes as needed for chest pain.   ondansetron 4 MG disintegrating tablet Commonly known as: ZOFRAN-ODT Take 1 tablet by mouth daily as needed.   pantoprazole 40 MG tablet Commonly known as: PROTONIX Take 1 tablet (40 mg total) by mouth 2 (two) times daily before a meal.   rosuvastatin 10 MG tablet Commonly known as: CRESTOR Take 10 mg by mouth daily.   sitaGLIPtin 25 MG tablet Commonly known as: JANUVIA Take 25 mg by mouth 2 (two) times daily.   Symbicort 80-4.5 MCG/ACT inhaler Generic drug: budesonide-formoterol Inhale 2 puffs into the lungs 2 (two) times daily as needed.   Systane Balance 0.6 % Soln Generic drug: Propylene  Glycol Apply 1 drop to eye daily as needed (1 GTT OU QD PRN FOR ALLERGIES.).   torsemide 20 MG tablet Commonly known as: DEMADEX Take 1 tablet by mouth 3 (three) times daily. TAKE 2 TABS PO EACH AM, 1 TAB PO EACH PM.            Durable Medical Equipment  (From admission, onward)         Start     Ordered   04/19/19 1007  DME 3-in-1  Once     04/19/19 1006          Allergies  Allergen Reactions  . Codeine   . Lisinopril     Consultations:  GI  Cardiology   Procedures/Studies: US Renal  Result Date: 04/13/2019 CLINICAL DATA:  Acute on chronic kidney injury. EXAM: RENAL / URINARY TRACT ULTRASOUND COMPLETE COMPARISON:  None FINDINGS: Right Kidney: Renal measurements: 10.5 x 5.0 x 5.2 cm =  volume: 141.0 mL . Echogenicity within normal limits. No mass or hydronephrosis visualized. Left Kidney: Renal measurements: 11.7 by 5.3 x 6.0 cm = volume: 194.1 mL. Echogenicity within normal limits. No mass or hydronephrosis visualized. Bladder: Not visualized Other: Mild to moderate volume of ascites identified within the right lower quadrant and left lower quadrant of the abdomen. IMPRESSION: Normal appearance of the kidneys.  No hydronephrosis or mass. Mild to moderate abdominal ascites Electronically Signed   By: Kerby Moors M.D.   On: 04/13/2019 10:54   US Abdomen Limited  Result Date: 04/19/2019 CLINICAL DATA:  Cellulitis at cholecystostomy site EXAM: ULTRASOUND ABDOMEN LIMITED RIGHT UPPER QUADRANT COMPARISON:  None. FINDINGS: Gallbladder: Catheter is present within the gallbladder. Mild gallbladder wall thickening measuring 5 mm. No sonographic Percell Miller sign reported. Common bile duct: Diameter: 4 mm Liver: No focal lesion identified. Parenchymal echogenicity is within normal limits. Portal vein is patent on color Doppler imaging with normal direction of blood flow towards the liver. Other: There is right pleural and right perihepatic fluid. There is no subcutaneous fluid collection about the drainage catheter. IMPRESSION: No fluid collection about the cholecystostomy catheter. Right pleural and perihepatic fluid. Electronically Signed   By: Macy Mis M.D.   On: 04/19/2019 09:20   DG Chest Port 1 View  Result Date: 04/13/2019 CLINICAL DATA:  Awoke from sleep with chest pain. EXAM: PORTABLE CHEST 1 VIEW COMPARISON:  None. FINDINGS: The heart is enlarged. Prosthetic aortic valve. Vascular congestion with perivascular haziness. Suspected small bilateral pleural effusions. No pneumothorax. No acute osseous abnormalities are seen. Symmetric widening of the acromioclavicular joints. IMPRESSION: Cardiomegaly with vascular congestion and perivascular haziness, suspicious for pulmonary edema. Suspected  small bilateral pleural effusions. Overall findings suspicious for CHF. Electronically Signed   By: Keith Rake M.D.   On: 04/13/2019 04:58     Discharge Exam: Vitals:   04/19/19 0550 04/19/19 0551  BP: (!) 100/49   Pulse: 72   Resp: 18 18  Temp: 98.2 F (36.8 C)   SpO2: 98%    Vitals:   04/18/19 2200 04/18/19 2300 04/19/19 0550 04/19/19 0551  BP:   (!) 100/49   Pulse: 61  72   Resp: (!) 21  18 18   Temp:  99 F (37.2 C) 98.2 F (36.8 C)   TempSrc:  Oral Oral   SpO2: 94%  98%   Weight:   98.1 kg   Height:        General: Pt is alert, awake, not in acute distress Cardiovascular: RRR, S1/S2 +, no rubs, no  gallops Respiratory: CTA bilaterally, no wheezing, no rhonchi, currently on 2 L nasal cannula Abdominal: Soft, NT, ND, bowel sounds + cholecystostomy drain without any further drainage around site and no further erythema noted. Extremities: no edema, no cyanosis    The results of significant diagnostics from this hospitalization (including imaging, microbiology, ancillary and laboratory) are listed below for reference.     Microbiology: Recent Results (from the past 240 hour(s))  SARS CORONAVIRUS 2 (TAT 6-24 HRS) Nasopharyngeal Nasopharyngeal Swab     Status: None   Collection Time: 04/13/19 10:12 AM   Specimen: Nasopharyngeal Swab  Result Value Ref Range Status   SARS Coronavirus 2 NEGATIVE NEGATIVE Final    Comment: (NOTE) SARS-CoV-2 target nucleic acids are NOT DETECTED. The SARS-CoV-2 RNA is generally detectable in upper and lower respiratory specimens during the acute phase of infection. Negative results do not preclude SARS-CoV-2 infection, do not rule out co-infections with other pathogens, and should not be used as the sole basis for treatment or other patient management decisions. Negative results must be combined with clinical observations, patient history, and epidemiological information. The expected result is Negative. Fact Sheet for  Patients: SugarRoll.be Fact Sheet for Healthcare Providers: https://www.woods-mathews.com/ This test is not yet approved or cleared by the [place] FDA and  has been authorized for detection and/or diagnosis of SARS-CoV-2 by FDA under an Emergency Use Authorization (EUA). This EUA will remain  in effect (meaning this test can be used) for the duration of the COVID-19 declaration under Section 56 4(b)(1) of the Act, 21 U.S.C. section 360bbb-3(b)(1), unless the authorization is terminated or revoked sooner. Performed at [facility], [place] [address], [place], [place] [address]   MRSA PCR Screening     Status: None   Collection Time: 04/13/19  5:07 PM   Specimen: Nasal Mucosa; Nasopharyngeal  Result Value Ref Range Status   MRSA by PCR NEGATIVE NEGATIVE Final    Comment:        The GeneXpert MRSA Assay (FDA approved for NASAL specimens only), is one component of a comprehensive MRSA colonization surveillance program. It is not intended to diagnose MRSA infection nor to guide or monitor treatment for MRSA infections. Performed at [facility], [address], [place], [place] [address]      Labs: BNP (last 3 results) Recent Labs    04/13/19 1426  BNP [id number]*   Basic Metabolic Panel: Recent Labs  Lab 04/14/19 0527 04/15/19 0442 04/16/19 0836 04/17/19 0439 04/18/19 0449 04/19/19 0519  NA 137 139 133* 136 138 140  K 3.9 3.9 3.7 3.5 3.6 3.6  CL 96* 94* 91* 92* 93* 93*  CO2 29 31 31  32 35* 35*  GLUCOSE 190* 152* 221* 173* 151* 164*  BUN 42* 38* 35* 30* 28* 25*  CREATININE 2.04* 1.80* 1.75* 1.57* 1.32* 1.24  CALCIUM 8.4* 8.8* 8.3* 8.4* 8.7* 8.7*  MG 1.9 2.0  --   --  2.0 2.0   Liver Function Tests: Recent Labs  Lab 04/13/19 0500 04/14/19 0527 04/15/19 0442  AST 37 39 25  ALT 18 35 26  ALKPHOS 171* 210* 189*  BILITOT 1.4* 2.9* 2.7*  PROT 6.8 6.7 6.5  ALBUMIN 3.3* 3.2* 3.0*   Recent Labs  Lab 04/13/19 0500   LIPASE 29   No results for input(s): AMMONIA in the last 168 hours. CBC: Recent Labs  Lab 04/13/19 0500 04/14/19 0527 04/15/19 0442 04/16/19 0504 04/17/19 0439 04/18/19 0449 04/19/19 0519  WBC 7.5 6.9 3.4* 2.3* 2.4*  2.9* 3.6*  NEUTROABS 6.4 6.1 2.9 1.9  --   --   --   HGB 7.6* 9.4* 8.8* 8.7* 8.4* 8.6* 8.9*  HCT 26.0* 30.8* 29.5* 29.3* 27.9* 28.9* 29.7*  MCV 98.5 96.9 96.7 97.0 96.5 95.7 96.1  PLT 154 130* 114* 100* 97* 105* 138*   Cardiac Enzymes: No results for input(s): CKTOTAL, CKMB, CKMBINDEX, TROPONINI in the last 168 hours. BNP: Invalid input(s): POCBNP CBG: Recent Labs  Lab 04/18/19 0751 04/18/19 1140 04/18/19 1657 04/18/19 2107 04/19/19 0845  GLUCAP 194* 363* 190* 124* 241*   D-Dimer No results for input(s): DDIMER in the last 72 hours. Hgb A1c No results for input(s): HGBA1C in the last 72 hours. Lipid Profile No results for input(s): CHOL, HDL, LDLCALC, TRIG, CHOLHDL, LDLDIRECT in the last 72 hours. Thyroid function studies No results for input(s): TSH, T4TOTAL, T3FREE, THYROIDAB in the last 72 hours.  Invalid input(s): FREET3 Anemia work up No results for input(s): VITAMINB12, FOLATE, FERRITIN, TIBC, IRON, RETICCTPCT in the last 72 hours. Urinalysis No results found for: COLORURINE, APPEARANCEUR, [place], [place], [place], [place], [place], [place], PROTEINUR, UROBILINOGEN, NITRITE, LEUKOCYTESUR Sepsis Labs Invalid input(s): PROCALCITONIN,  WBC,  LACTICIDVEN Microbiology Recent Results (from the past 240 hour(s))  SARS CORONAVIRUS 2 (TAT 6-24 HRS) Nasopharyngeal Nasopharyngeal Swab     Status: None   Collection Time: 04/13/19 10:12 AM   Specimen: Nasopharyngeal Swab  Result Value Ref Range Status   SARS Coronavirus 2 NEGATIVE NEGATIVE Final    Comment: (NOTE) SARS-CoV-2 target nucleic acids are NOT DETECTED. The SARS-CoV-2 RNA is generally detectable in upper and lower respiratory specimens during the acute phase of infection.  Negative results do not preclude SARS-CoV-2 infection, do not rule out co-infections with other pathogens, and should not be used as the sole basis for treatment or other patient management decisions. Negative results must be combined with clinical observations, patient history, and epidemiological information. The expected result is Negative. Fact Sheet for Patients: SugarRoll.be Fact Sheet for Healthcare Providers: https://www.woods-mathews.com/ This test is not yet approved or cleared by the [place] FDA and  has been authorized for detection and/or diagnosis of SARS-CoV-2 by FDA under an Emergency Use Authorization (EUA). This EUA will remain  in effect (meaning this test can be used) for the duration of the COVID-19 declaration under Section 56 4(b)(1) of the Act, 21 U.S.C. section 360bbb-3(b)(1), unless the authorization is terminated or revoked sooner. Performed at [facility], [place] [address], [place], [place] [address]   MRSA PCR Screening     Status: None   Collection Time: 04/13/19  5:07 PM   Specimen: Nasal Mucosa; Nasopharyngeal  Result Value Ref Range Status   MRSA by PCR NEGATIVE NEGATIVE Final    Comment:        The GeneXpert MRSA Assay (FDA approved for NASAL specimens only), is one component of a comprehensive MRSA colonization surveillance program. It is not intended to diagnose MRSA infection nor to guide or monitor treatment for MRSA infections. Performed at [facility], [address], [place], [place] [address]      Time coordinating discharge: 35 minutes  SIGNED:   Rodena Goldmann, DO [facility] 04/19/2019, 10:07 AM  If 7PM-7AM, please contact night-coverage [email or web address]

## 2019-04-19 NOTE — Clinical Social Work Note (Addendum)
3n1 ordered through Blake Divine with [employer].   Patient referred to [facility] on Friday 04/16/19. Linda with [facility] advised of discharge today.  TOC signing off.    Eleazar Kimmey, Clydene Pugh, LCSW

## 2019-04-19 NOTE — Telephone Encounter (Signed)
Please arrange for outpatient visit with [facility]:   Was inpatient at [facility] with GI bleed. Needs office visit at [facility] due to dysphagia, anemia with obscure GI bleed, EGD, +/- colonoscopy.

## 2019-04-19 NOTE — Telephone Encounter (Signed)
Patient appt scheduled for 3/5 at 1:00pm (1st available) with Dr. Lynita Lombard. They will mail patient new packet information with appt details/date/time/location. Records faxed

## 2019-04-19 NOTE — Addendum Note (Signed)
Addended by: Cheron Every on: 04/19/2019 11:31 AM   Modules accepted: Orders

## 2019-04-19 NOTE — Progress Notes (Signed)
Physical Therapy Treatment Patient Details Name: Matthew Campos MRN: [id number] DOB: [birth year] Today's Date: 04/19/2019    History of Present Illness Matthew Campos is a 79 y.o. 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  He says he had chest pain severe in the center part of the chest that woke him up from sleeping around 2:30 AM.  He took 4 baby aspirin and 3 sublingual nitroglycerin prior to EMS arrival.  He says that the pain resolved after that point.  He says that nothing seemed to make the pain worse as it was pain at rest.  He says it felt similar to when he had a heart attack in the past.  He had no shortness of breath or diaphoresis or nausea associated with this chest pain episode.  He says that overall the pain lasted for approximately 1 hour and then it resolved.  He is currently chest pain-free.  He says that he has been receiving most of his care at [facility] where he had his valve replacement and cholecystostomy.  He says that the tube is in place for an indefinite amount of time.  It does not have a bowel drain on it.  He is fully anticoagulated on apixaban.  In the ED he was noted to be anemic and was heme positive.    PT Comments    Pt pleasant and agreeable to therapy. Pt ambulates to toilet in room then back to bedside chair. Initial steps unsteady, but improves with distance. Education on hand placement with STS transfers improving ability to rise from seated surfaces. Pt tolerates therapeutic exercise and able to continue conversation throughout, with  decrease in O2 sat to 86% on 3LPM O2, but rebounded to 93% with rest and pursed lip breathing. Pt demonstrates mild SOB with mobility, but denies dizziness, fatigue, or lightheadedness. Pt will benefit from continued physical therapy in hospital and recommended venue below to increase strength, balance, endurance for safe ADLs and gait.    Follow Up Recommendations  Home health PT     Equipment Recommendations  3in1 (PT)    Recommendations for Other Services       Precautions / Restrictions Precautions Precautions: Fall Restrictions Weight Bearing Restrictions: No    Mobility  Bed Mobility Overal bed mobility: Needs Assistance Bed Mobility: Supine to Sit     Supine to sit: Min guard     General bed mobility comments: verbal cues for hand placement and sequencing, use of bedrail to upright trunk  Transfers Overall transfer level: Needs assistance Equipment used: Rolling walker (2 wheeled) Transfers: Sit to/from Stand Sit to Stand: Min guard         General transfer comment: verbal cues for hand placement, pushing through bil flat feet and using armrests to assist in rising  Ambulation/Gait Ambulation/Gait assistance: Min guard Gait Distance (Feet): 10 Feet Assistive device: Rolling walker (2 wheeled) Gait Pattern/deviations: Decreased step length - right;Decreased step length - left;Decreased stride length Gait velocity: decreased   General Gait Details: short, shuffled steps to toilet and back to bedside chair in room, unsteadiness initially improving with distance, no loss of balance and good RW management  Stairs             Wheelchair Mobility    Modified Rankin (Stroke Patients Only)       Balance Overall balance assessment: Needs assistance Sitting-balance support: No upper extremity supported;Feet supported Sitting balance-Leahy Scale: Good Sitting balance - Comments: seated EOB   Standing balance support: Bilateral upper extremity  supported;During functional activity Standing balance-Leahy Scale: Fair Standing balance comment: with RW                            Cognition Arousal/Alertness: Awake/alert Behavior During Therapy: WFL for tasks assessed/performed Overall Cognitive Status: Within Functional Limits for tasks assessed                                        Exercises General Exercises - Lower Extremity Long Arc Quad: Seated;Both;10 reps Hip Flexion/Marching: Seated;Both;10 reps Mini-Sqauts: 5 reps(STS with RW for support)    General Comments General comments (skin integrity, edema, etc.): on 3LPM upon arrival with O2 sat 86%, improving to 93% with pursed lip breathing verbal cues, desat to 86% with ambulation in room and therapeutic exercise, again increased to 93% with rest and pursed lip breathing verbal cues      Pertinent Vitals/Pain Pain Assessment: No/denies pain    Home Living                      Prior Function            PT Goals (current goals can now be found in the care plan section) Acute Rehab PT Goals Patient Stated Goal: return home with wife PT Goal Formulation: With patient/family Time For Goal Achievement: 04/30/19 Potential to Achieve Goals: Good Progress towards PT goals: Progressing toward goals    Frequency    Min 3X/week      PT Plan Current plan remains appropriate    Co-evaluation              AM-PAC PT "6 Clicks" Mobility   Outcome Measure  Help needed turning from your back to your side while in a flat bed without using bedrails?: None Help needed moving from lying on your back to sitting on the side of a flat bed without using bedrails?: A Little Help needed moving to and from a bed to a chair (including a wheelchair)?: A Little Help needed standing up from a chair using your arms (e.g., wheelchair or bedside chair)?: A Little Help needed to walk in hospital room?: A Little Help needed climbing 3-5 steps  with a railing? : A Lot 6 Click Score: 18    End of Session Equipment Utilized During Treatment: Gait belt;Oxygen Activity Tolerance: Patient tolerated treatment well;Patient limited by fatigue Patient left: in chair;with call bell/phone within reach Nurse Communication: Mobility status;Other (comment)(O2 sat with mobility) PT Visit Diagnosis: Unsteadiness on feet (R26.81);Other abnormalities of gait and mobility (R26.89);Muscle weakness (generalized) (M62.81)     Time: [id number] PT Time Calculation (min) (ACUTE ONLY): 27 min  Charges:  $Gait Training: 8-22 mins $Therapeutic Exercise: 8-22 mins                      Tori Daris Harkins PT, DPT 04/19/19, 9:46 AM [phone]

## 2019-04-20 DIAGNOSIS — K2970 Gastritis, unspecified, without bleeding: Secondary | ICD-10-CM | POA: Diagnosis not present

## 2019-04-20 DIAGNOSIS — R262 Difficulty in walking, not elsewhere classified: Secondary | ICD-10-CM | POA: Diagnosis not present

## 2019-04-20 DIAGNOSIS — I2510 Atherosclerotic heart disease of native coronary artery without angina pectoris: Secondary | ICD-10-CM | POA: Diagnosis not present

## 2019-04-20 DIAGNOSIS — I5031 Acute diastolic (congestive) heart failure: Secondary | ICD-10-CM | POA: Diagnosis not present

## 2019-04-20 DIAGNOSIS — K921 Melena: Secondary | ICD-10-CM | POA: Diagnosis not present

## 2019-04-20 DIAGNOSIS — I5023 Acute on chronic systolic (congestive) heart failure: Secondary | ICD-10-CM | POA: Diagnosis not present

## 2019-04-20 DIAGNOSIS — I130 Hypertensive heart and chronic kidney disease with heart failure and stage 1 through stage 4 chronic kidney disease, or unspecified chronic kidney disease: Secondary | ICD-10-CM | POA: Diagnosis not present

## 2019-04-20 DIAGNOSIS — K811 Chronic cholecystitis: Secondary | ICD-10-CM | POA: Diagnosis not present

## 2019-04-20 DIAGNOSIS — I480 Paroxysmal atrial fibrillation: Secondary | ICD-10-CM | POA: Diagnosis not present

## 2019-04-20 DIAGNOSIS — I110 Hypertensive heart disease with heart failure: Secondary | ICD-10-CM | POA: Diagnosis not present

## 2019-04-20 DIAGNOSIS — E1122 Type 2 diabetes mellitus with diabetic chronic kidney disease: Secondary | ICD-10-CM | POA: Diagnosis not present

## 2019-04-20 DIAGNOSIS — N183 Chronic kidney disease, stage 3 unspecified: Secondary | ICD-10-CM | POA: Diagnosis not present

## 2019-04-20 DIAGNOSIS — D500 Iron deficiency anemia secondary to blood loss (chronic): Secondary | ICD-10-CM | POA: Diagnosis not present

## 2019-04-20 DIAGNOSIS — K2270 Barrett's esophagus without dysplasia: Secondary | ICD-10-CM | POA: Diagnosis not present

## 2019-04-20 DIAGNOSIS — N179 Acute kidney failure, unspecified: Secondary | ICD-10-CM | POA: Diagnosis not present

## 2019-04-21 ENCOUNTER — Other Ambulatory Visit: Payer: Self-pay

## 2019-04-22 ENCOUNTER — Ambulatory Visit (INDEPENDENT_AMBULATORY_CARE_PROVIDER_SITE_OTHER): Payer: Medicare HMO | Admitting: Family Medicine

## 2019-04-22 ENCOUNTER — Telehealth: Payer: Self-pay | Admitting: Gastroenterology

## 2019-04-22 ENCOUNTER — Encounter: Payer: Self-pay | Admitting: *Deleted

## 2019-04-22 VITALS — BP 113/63 | HR 66 | Temp 99.3°F

## 2019-04-22 DIAGNOSIS — D649 Anemia, unspecified: Secondary | ICD-10-CM

## 2019-04-22 DIAGNOSIS — E119 Type 2 diabetes mellitus without complications: Secondary | ICD-10-CM

## 2019-04-22 DIAGNOSIS — Z09 Encounter for follow-up examination after completed treatment for conditions other than malignant neoplasm: Secondary | ICD-10-CM

## 2019-04-22 DIAGNOSIS — Z7409 Other reduced mobility: Secondary | ICD-10-CM | POA: Diagnosis not present

## 2019-04-22 DIAGNOSIS — Z9981 Dependence on supplemental oxygen: Secondary | ICD-10-CM

## 2019-04-22 DIAGNOSIS — R7989 Other specified abnormal findings of blood chemistry: Secondary | ICD-10-CM

## 2019-04-22 LAB — SURGICAL PATHOLOGY

## 2019-04-22 LAB — BAYER DCA HB A1C WAIVED: HB A1C (BAYER DCA - WAIVED): 7 % — ABNORMAL HIGH (ref <7.0)

## 2019-04-22 NOTE — Progress Notes (Signed)
Subjective: CC: Hospital discharge follow-up PCP: [name], DO TMH:DQQI Matthew Campos is a 79 y.o. male presenting to clinic today for:  1.  Hospital discharge follow-up Patient was hospitalized for chest pain.  At presentation to the emergency department chest pain had resolved but he was noted to be hypotensive to systolics of 29N.  Additionally, he had heme positive stool.  He was bolused and this improved blood pressures.  He ultimately did require 2 units of packed red blood cells.  He underwent EGD which did not demonstrate any etiology of the bleed.  Colonoscopy was not performed.  He was recommended to follow-up with his gastroenterologist at [facility] to have this performed.  However, he was told to discontinue the blood thinner until he was evaluated by his specialist on the outpatient setting.  Additionally, he notes that his tube for the gallbladder had somewhat come out.  An ultrasound was performed which noted that the tube was still within the gallbladder.  He was instructed to follow-up with his outpatient providers for this.  He was discharged home with supplemental oxygen, as he was desaturating on ambulation.  He has home health that is supposed to be coming to the house and notes that physical therapy came over yesterday.  Home health nurse has not yet come.  His wife notes that she is very interested in getting him some additional help at home and to and from appointments.  She was told that there is something called aging and disability that might be helpful.  Patient overall states that he is feeling okay.  He does admit that he has been quite jittery, anxious, and had difficulty falling asleep since discharge from the hospital.  Of note, he was found to have a TSH of greater than 15 and was placed on Synthroid 150 mcg daily.  He has been compliant with this but never had a known thyroid disorder.   ROS: Per HPI  Allergies  Allergen Reactions    . Codeine Nausea And Vomiting  . Latex Hives and Itching  . Lisinopril Cough   Past Medical History:  Diagnosis Date  . AAA (abdominal aortic aneurysm) without rupture ([place])    repaired  . Aortic stenosis   . CHF (congestive heart failure) ([place])   . Complication of anesthesia    hard to be put to sleep  . COPD (chronic obstructive pulmonary disease) ([place])   . Coronary artery disease   . Diabetes mellitus without complication ([place])    takes Januvia and Metformin daily  . GERD (gastroesophageal reflux disease)    takes omeprazole daily  . Headache(784.0)    sinus  . History of bronchitis    last time >30yr ago  . Hyperlipidemia    takes Lipitor daily  . Hypertension    takes Metoprolol daily  . Joint pain   . Myocardial infarction ([place]    x 3;last one about [id number]  . Pneumonia    last itme about [id number]    Current Outpatient Medications:  .  albuterol (PROVENTIL) (2.5 MG/3ML) 0.083% nebulizer solution, Take 2.5 mg by nebulization every 6 (six) hours as needed for wheezing or shortness of breath., Disp: , Rfl:  .  albuterol (VENTOLIN HFA) 108 (90 Base) MCG/ACT inhaler, Inhale 2 puffs into the lungs every 6 (six) hours as needed for wheezing or shortness of breath., Disp: 18 g, Rfl: 3 .  aspirin 81 MG chewable tablet, Chew 81 mg by mouth daily.,  Disp: , Rfl:  .  budesonide-formoterol (SYMBICORT) 80-4.5 MCG/ACT inhaler, Inhale 2 puffs into the lungs 2 (two) times daily., Disp: 3 Inhaler, Rfl: 1 .  Calcium Carbonate-Simethicone 750-80 MG CHEW, Chew 1 tablet by mouth as needed., Disp: , Rfl:  .  cephALEXin (KEFLEX) 500 MG capsule, Take 500 mg by mouth 4 (four) times daily., Disp: , Rfl:  .  escitalopram (LEXAPRO) 10 MG tablet, TAKE ONE (1) TABLET EACH DAY, Disp: 90 tablet, Rfl: 0 .  glucose blood (ONETOUCH VERIO) test strip, TID Dx E11.9, Disp: , Rfl:  .  levothyroxine (SYNTHROID) 150 MCG tablet, Take 150 mcg by mouth daily before breakfast., Disp: , Rfl:  .  Multiple  Vitamin (MULTIVITAMIN WITH MINERALS) TABS, Take 1 tablet by mouth daily., Disp: , Rfl:  .  pantoprazole (PROTONIX) 20 MG tablet, Take 20 mg by mouth 2 (two) times daily., Disp: , Rfl:  .  rosuvastatin (CRESTOR) 20 MG tablet, Take 1 tablet (20 mg total) by mouth daily., Disp: 90 tablet, Rfl: 1 .  sertraline (ZOLOFT) 25 MG tablet, Take 1 tablet (25 mg total) by mouth daily., Disp: 30 tablet, Rfl: 2 .  sitaGLIPtin (JANUVIA) 50 MG tablet, Take 1 tablet (50 mg total) by mouth 2 (two) times a day., Disp: 180 tablet, Rfl: 1 .  torsemide (DEMADEX) 20 MG tablet, Take 2 tablets (40 mg total) by mouth 2 (two) times daily., Disp: 180 tablet, Rfl: 1 .  Blood Glucose Monitoring Suppl (ONETOUCH VERIO) w/Device KIT, TID, Disp: , Rfl:  .  clopidogrel (PLAVIX) 75 MG tablet, Take 1 tablet (75 mg total) by mouth daily. (Patient not taking: Reported on 04/22/2019), Disp: 90 tablet, Rfl: 1 .  glipiZIDE (GLUCOTROL XL) 10 MG 24 hr tablet, , Disp: , Rfl:  .  metFORMIN (GLUCOPHAGE XR) 500 MG 24 hr tablet, Take 2 tablets (1,000 mg total) by mouth 2 (two) times daily. (Patient not taking: Reported on 04/22/2019), Disp: 360 tablet, Rfl: 3 .  metoprolol tartrate (LOPRESSOR) 50 MG tablet, Take 1 tablet (50 mg total) by mouth 2 (two) times daily. (Patient not taking: Reported on 04/22/2019), Disp: 180 tablet, Rfl: 1 .  nitroGLYCERIN (NITROSTAT) 0.4 MG SL tablet, Place 0.4 mg under the tongue every 5 (five) minutes as needed. For chest pain, Disp: , Rfl:  .  ofloxacin (OCUFLOX) 0.3 % ophthalmic solution, every morning., Disp: , Rfl:  .  omeprazole (PRILOSEC) 40 MG capsule, TAKE ONE (1) CAPSULE EACH DAY (Patient not taking: Reported on 04/22/2019), Disp: 90 capsule, Rfl: 0 .  ondansetron (ZOFRAN-ODT) 4 MG disintegrating tablet, TAKE 1 TABLET EVERY 8 HOURS AS NEEDED FOR NAUSEA AND VOMITING, Disp: 20 tablet, Rfl: 0 Social History   Socioeconomic History  . Marital status: Married    Spouse name: Not on file  . Number of children: Not on  file  . Years of education: Not on file  . Highest education level: Not on file  Occupational History  . Not on file  Tobacco Use  . Smoking status: Former Research scientist (life sciences)  . Smokeless tobacco: Never Used  . Tobacco comment: quit smoking 20+yrs ago  Substance and Sexual Activity  . Alcohol use: No  . Drug use: No  . Sexual activity: Yes  Other Topics Concern  . Not on file  Social History Narrative  . Not on file   Social Determinants of Health   Financial Resource Strain:   . Difficulty of Paying Living Expenses: Not on file  Food Insecurity:   . Worried About  Running Out of Food in the Last Year: Not on file  . Ran Out of Food in the Last Year: Not on file  Transportation Needs:   . Lack of Transportation (Medical): Not on file  . Lack of Transportation (Non-Medical): Not on file  Physical Activity:   . Days of Exercise per Week: Not on file  . Minutes of Exercise per Session: Not on file  Stress:   . Feeling of Stress : Not on file  Social Connections:   . Frequency of Communication with Friends and Family: Not on file  . Frequency of Social Gatherings with Friends and Family: Not on file  . Attends Religious Services: Not on file  . Active Member of Clubs or Organizations: Not on file  . Attends Archivist Meetings: Not on file  . Marital Status: Not on file  Intimate Partner Violence:   . Fear of Current or Ex-Partner: Not on file  . Emotionally Abused: Not on file  . Physically Abused: Not on file  . Sexually Abused: Not on file   Family History  Problem Relation Age of Onset  . Cancer Mother        Unknown type  . Heart disease Father   . Diabetes Father   . Heart disease Sister   . Seizures Brother   . Diabetes Daughter   . Diabetes Daughter   . Heart attack Son     Objective: Office vital signs reviewed. BP 113/63   Pulse 66   Temp 99.3 F (37.4 C) (Temporal)   SpO2 90%   Physical Examination:  General: Awake, alert, chronically ill  appearing, No acute distress HEENT: Normal.  No goiter, no exophthalmos Cardio: regular rate and rhythm, S1S2 heard  Pulm: Globally decreased breath sounds.  No appreciable rales or rhonchi.  He has normal work of breathing on supplemental oxygen. GI: Protuberant.  Nontender.  Right upper quadrant with tube in place.  No surrounding erythema.  Scant drainage on bandage. Extremities: warm, well perfused MSK: Arrives in wheelchair Skin: dry; intact; no rashes or lesions Neuro: Patient is appropriate and interactive.  Assessment/ Plan: 79 y.o. male   1. Hospital discharge follow-up I reviewed his discharge summary and recommendations.  It appears he has 2 charts which will require merging.  Labs that were collected show acute renal injury.  Discharge creatinine was 1.29.  His hemoglobin however has remained stable.  I suspect renal injury may be related to recent diuresis.  He has close follow-up with cardiology soon so will await their recommendations before making any adjustments. - Basic Metabolic Panel - CBC - CCM Nurse  2. Diabetes mellitus without complication ([place]) Blood sugar stable.  A1c at 7.0.  Medications were adjusted in the hospital.  We will continue to monitor closely and if blood pressures arise may need to consider adding insulin - hgba1c - Microalbumin / creatinine urine ratio - Basic Metabolic Panel - CCM Nurse  3. Anemia, unspecified type Stable from discharge from hospital.  Work-up ongoing - Basic Metabolic Panel - CBC - CCM Nurse  4. Abnormal TSH Because he is having significant side effects from the Synthroid, I am going to recommend that we hold off on ongoing treatment for this isolated elevation in TSH.  While I do agree that typical TSH is above 7 warrant treatment, TSH abnormality may be related to acute illness.  Plan for recheck in 4 weeks.  The patient will reach out to me sooner if he starts feeling  sluggish, having constipation, cold intolerance or  other signs and symptoms of hypothyroidism.  At that point, would consider half dose, 75 mcg, daily.  5. Requires oxygen therapy Continue oxygenation. - CCM Nurse  6. Limited mobility I have reached out to CCM to see if perhaps they can assist with this.  I wonder if patient would be eligible for pace of the triad? - CCM Nurse   Orders Placed This Encounter  Procedures  . hgba1c  . Microalbumin / creatinine urine ratio  . Basic Metabolic Panel  . CBC   No orders of the defined types were placed in this encounter.    [name], DO [place] [phone]

## 2019-04-22 NOTE — Telephone Encounter (Signed)
Pt and spouse notified of results. Pt has a 1 month supple of Pantoprazole 40 mg bid with no refills. SLF, pt will need a RX sent to his pharmacy with refills.   Please arrange ref with [facility] for 4-6 weeks

## 2019-04-22 NOTE — Telephone Encounter (Signed)
Patient already has pending appt with [facility]. He will be added to wait list for sooner appt

## 2019-04-22 NOTE — Telephone Encounter (Addendum)
PLEASE CALL PT. HIS BIOPSIES SHOW MILD DUODENITIS AND A SIMPLE ADENOMA WAS REMOVED FROM HIS SMALL BOWEL.  HE NEEDS AN OPV WITH [facility] WITHIN THE NEXT 4-6 WEEKS, Dx: OBSCURE GI BLEED. CONTINUE PROTONIX.  TAKE 30 MINUTES PRIOR TO YOUR MEALS TWICE DAILY FOR ONE MONTH THEN ONCE DAILY FOREVER.

## 2019-04-23 DIAGNOSIS — E1122 Type 2 diabetes mellitus with diabetic chronic kidney disease: Secondary | ICD-10-CM | POA: Diagnosis not present

## 2019-04-23 DIAGNOSIS — I110 Hypertensive heart disease with heart failure: Secondary | ICD-10-CM | POA: Diagnosis not present

## 2019-04-23 DIAGNOSIS — K811 Chronic cholecystitis: Secondary | ICD-10-CM | POA: Diagnosis not present

## 2019-04-23 DIAGNOSIS — N183 Chronic kidney disease, stage 3 unspecified: Secondary | ICD-10-CM | POA: Diagnosis not present

## 2019-04-23 DIAGNOSIS — R262 Difficulty in walking, not elsewhere classified: Secondary | ICD-10-CM | POA: Diagnosis not present

## 2019-04-23 DIAGNOSIS — N179 Acute kidney failure, unspecified: Secondary | ICD-10-CM | POA: Diagnosis not present

## 2019-04-23 DIAGNOSIS — I5023 Acute on chronic systolic (congestive) heart failure: Secondary | ICD-10-CM | POA: Diagnosis not present

## 2019-04-23 DIAGNOSIS — I2510 Atherosclerotic heart disease of native coronary artery without angina pectoris: Secondary | ICD-10-CM | POA: Diagnosis not present

## 2019-04-23 DIAGNOSIS — K921 Melena: Secondary | ICD-10-CM | POA: Diagnosis not present

## 2019-04-23 DIAGNOSIS — D500 Iron deficiency anemia secondary to blood loss (chronic): Secondary | ICD-10-CM | POA: Diagnosis not present

## 2019-04-23 LAB — BASIC METABOLIC PANEL
BUN/Creatinine Ratio: 14 (ref 10–24)
BUN: 21 mg/dL (ref 8–27)
CO2: 32 mmol/L — ABNORMAL HIGH (ref 20–29)
Calcium: 8.6 mg/dL (ref 8.6–10.2)
Chloride: 92 mmol/L — ABNORMAL LOW (ref 96–106)
Creatinine, Ser: 1.49 mg/dL — ABNORMAL HIGH (ref 0.76–1.27)
GFR calc Af Amer: 51 mL/min/{1.73_m2} — ABNORMAL LOW (ref >59)
GFR calc non Af Amer: 44 mL/min/{1.73_m2} — ABNORMAL LOW (ref >59)
Glucose: 187 mg/dL — ABNORMAL HIGH (ref 65–99)
Potassium: 4.1 mmol/L (ref 3.5–5.2)
Sodium: 140 mmol/L (ref 134–144)

## 2019-04-23 LAB — CBC
Hematocrit: 29 % — ABNORMAL LOW (ref 37.5–51.0)
Hemoglobin: 9.2 g/dL — ABNORMAL LOW (ref 13.0–17.7)
MCH: 29.3 pg (ref 26.6–33.0)
MCHC: 31.7 g/dL (ref 31.5–35.7)
MCV: 92 fL (ref 79–97)
Platelets: 223 10*3/uL (ref 150–450)
RBC: 3.14 x10E6/uL — ABNORMAL LOW (ref 4.14–5.80)
RDW: 15.2 % (ref 11.6–15.4)
WBC: 6.5 10*3/uL (ref 3.4–10.8)

## 2019-04-23 NOTE — Telephone Encounter (Signed)
REVIEWED-NO ADDITIONAL RECOMMENDATIONS. 

## 2019-04-28 ENCOUNTER — Ambulatory Visit: Payer: Medicare HMO | Admitting: *Deleted

## 2019-04-28 ENCOUNTER — Telehealth: Payer: Self-pay | Admitting: Family Medicine

## 2019-04-28 DIAGNOSIS — R262 Difficulty in walking, not elsewhere classified: Secondary | ICD-10-CM | POA: Diagnosis not present

## 2019-04-28 DIAGNOSIS — I10 Essential (primary) hypertension: Secondary | ICD-10-CM

## 2019-04-28 DIAGNOSIS — Z9981 Dependence on supplemental oxygen: Secondary | ICD-10-CM

## 2019-04-28 DIAGNOSIS — I110 Hypertensive heart disease with heart failure: Secondary | ICD-10-CM | POA: Diagnosis not present

## 2019-04-28 DIAGNOSIS — K811 Chronic cholecystitis: Secondary | ICD-10-CM | POA: Diagnosis not present

## 2019-04-28 DIAGNOSIS — N183 Chronic kidney disease, stage 3 unspecified: Secondary | ICD-10-CM | POA: Diagnosis not present

## 2019-04-28 DIAGNOSIS — E119 Type 2 diabetes mellitus without complications: Secondary | ICD-10-CM

## 2019-04-28 DIAGNOSIS — J449 Chronic obstructive pulmonary disease, unspecified: Secondary | ICD-10-CM

## 2019-04-28 DIAGNOSIS — D500 Iron deficiency anemia secondary to blood loss (chronic): Secondary | ICD-10-CM | POA: Diagnosis not present

## 2019-04-28 DIAGNOSIS — N179 Acute kidney failure, unspecified: Secondary | ICD-10-CM | POA: Diagnosis not present

## 2019-04-28 DIAGNOSIS — I509 Heart failure, unspecified: Secondary | ICD-10-CM

## 2019-04-28 DIAGNOSIS — Z9181 History of falling: Secondary | ICD-10-CM

## 2019-04-28 DIAGNOSIS — I2510 Atherosclerotic heart disease of native coronary artery without angina pectoris: Secondary | ICD-10-CM | POA: Diagnosis not present

## 2019-04-28 DIAGNOSIS — E1122 Type 2 diabetes mellitus with diabetic chronic kidney disease: Secondary | ICD-10-CM | POA: Diagnosis not present

## 2019-04-28 DIAGNOSIS — I5023 Acute on chronic systolic (congestive) heart failure: Secondary | ICD-10-CM | POA: Diagnosis not present

## 2019-04-28 DIAGNOSIS — K921 Melena: Secondary | ICD-10-CM | POA: Diagnosis not present

## 2019-04-28 NOTE — Chronic Care Management (AMB) (Signed)
Chronic Care Management   Initial Visit Note  04/28/2019 Name: Salem D Gathers MRN: [id number] DOB: 03/21/1941  Referred by: Gottschalk, Ashly M, DO Reason for referral : Chronic Care Management (RN Initial Outreach)   Yoniel D Mraz is a 79 y.o. year old male who is a primary care patient of Gottschalk, Ashly M, DO. The CCM team was consulted for assistance with chronic disease management and care coordination needs related to HTN, CAD, AAA, CHF, COPD, OSA, DM, hyperlipidemia, anxiety.  Review of patient status, including review of consultants reports, relevant laboratory and other test results, and collaboration with appropriate care team members and the patient's provider was performed as part of comprehensive patient evaluation and provision of chronic care management services.    Subjective: I reached out to Mr Uriostegui by telephone today and spoke with his wife. He needs transportation assistance to his appointments in [place] and general CCM assistance with his chronic medical conditions.   Objective: Outpatient Encounter Medications as of 04/28/2019  Medication Sig  . albuterol (PROVENTIL) (2.5 MG/3ML) 0.083% nebulizer solution Take 2.5 mg by nebulization every 6 (six) hours as needed for wheezing or shortness of breath.  . albuterol (VENTOLIN HFA) 108 (90 Base) MCG/ACT inhaler Inhale 2 puffs into the lungs every 6 (six) hours as needed for wheezing or shortness of breath.  . aspirin 81 MG chewable tablet Chew 81 mg by mouth daily.  . Blood Glucose Monitoring Suppl (ONETOUCH VERIO) w/Device KIT TID  . budesonide-formoterol (SYMBICORT) 80-4.5 MCG/ACT inhaler Inhale 2 puffs into the lungs 2 (two) times daily.  . Calcium Carbonate-Simethicone 750-80 MG CHEW Chew 1 tablet by mouth as needed.  . cephALEXin (KEFLEX) 500 MG capsule Take 500 mg by mouth 4 (four) times daily.  . clopidogrel (PLAVIX) 75 MG tablet Take 1 tablet (75 mg total) by mouth daily. (Patient not taking:  Reported on 04/22/2019)  . escitalopram (LEXAPRO) 10 MG tablet TAKE ONE (1) TABLET EACH DAY  . glipiZIDE (GLUCOTROL XL) 10 MG 24 hr tablet   . glucose blood (ONETOUCH VERIO) test strip TID Dx E11.9  . levothyroxine (SYNTHROID) 150 MCG tablet Take 150 mcg by mouth daily before breakfast.  . metFORMIN (GLUCOPHAGE XR) 500 MG 24 hr tablet Take 2 tablets (1,000 mg total) by mouth 2 (two) times daily. (Patient not taking: Reported on 04/22/2019)  . metoprolol tartrate (LOPRESSOR) 50 MG tablet Take 1 tablet (50 mg total) by mouth 2 (two) times daily. (Patient not taking: Reported on 04/22/2019)  . Multiple Vitamin (MULTIVITAMIN WITH MINERALS) TABS Take 1 tablet by mouth daily.  . nitroGLYCERIN (NITROSTAT) 0.4 MG SL tablet Place 0.4 mg under the tongue every 5 (five) minutes as needed. For chest pain  . ofloxacin (OCUFLOX) 0.3 % ophthalmic solution every morning.  . omeprazole (PRILOSEC) 40 MG capsule TAKE ONE (1) CAPSULE EACH DAY (Patient not taking: Reported on 04/22/2019)  . ondansetron (ZOFRAN-ODT) 4 MG disintegrating tablet TAKE 1 TABLET EVERY 8 HOURS AS NEEDED FOR NAUSEA AND VOMITING  . pantoprazole (PROTONIX) 20 MG tablet Take 20 mg by mouth 2 (two) times daily.  . rosuvastatin (CRESTOR) 20 MG tablet Take 1 tablet (20 mg total) by mouth daily.  . sertraline (ZOLOFT) 25 MG tablet Take 1 tablet (25 mg total) by mouth daily.  . sitaGLIPtin (JANUVIA) 50 MG tablet Take 1 tablet (50 mg total) by mouth 2 (two) times a day.  . torsemide (DEMADEX) 20 MG tablet Take 2 tablets (40 mg total) by mouth 2 (two) times daily.     No facility-administered encounter medications on file as of 04/28/2019.    RN Assessment & Care Plan   . Assistance with Transportation       Current Barriers:  . Knowledge Deficits related to transportation assistance  Nurse Case Manager Clinical Goal(s):  . Over the next 7 days, patient/wife will verbalize understanding of available transportation assistance  Interventions:  . Discussed  [place] [facility] . RN will contact [facility] ([phone]) to see if they transport to [place] and if they are able to assist patient to get him out of the home and into the van o They have a ramp but it's too steep for the wife to maneuver the wheelchair safely . Discussed upcoming appointments next week on the 19th and 20th  Patient Self Care Activities:  . Self administers medications as prescribed . Calls provider office for new concerns or questions . Wife provides most care. Patient is wheelchair bound . Unable to independently walk   Initial goal documentation       . Chronic Disease Management Needs       Current Barriers:  . Chronic Disease Management support, education, and care coordination needs related to HTN, CAD, AAA, CHF, COPD, OSA, DM, hyperlipidemia, anxiety  Clinical Goal(s) related to HTN, CAD, AAA, CHF, COPD, OSA, DM, hyperlipidemia, anxiety:  Over the next 30 days, patient will:  . Work with the care management team to address educational, disease management, and care coordination needs  . Begin or continue self health monitoring activities as directed today Measure and record weight daily . Call provider office for new or worsened signs and symptoms Weight outside established parameters . Call care management team with questions or concerns . Verbalize basic understanding of patient centered plan of care established today  Interventions related to HTN, CAD, AAA, CHF, COPD, OSA, DM, hyperlipidemia, anxiety:  . Evaluation of current treatment plans and patient's adherence to plan as established by provider . Assessed patient understanding of disease states . Assessed patient's education and care coordination needs . Provided disease specific education to patient  . Collaborated with appropriate clinical care team members regarding patient needs  Patient Self Care Activities related to HTN, CAD, AAA, CHF, COPD, OSA, DM, hyperlipidemia, anxiety:   . Patient is unable to independently self-manage chronic health conditions  Initial goal documentation     . High Risk for Falls       Current Barriers:  . Knowledge Deficits related to fall precautions . Decreased adherence to prescribed treatment for fall prevention . Wife is much smaller and isn't able to help him transfer well  Nurse Case Manager Clinical Goal(s):  . Over the next 30 days, patient will demonstrate improved adherence to prescribed treatment plan for decreasing falls as evidenced by patient reporting and review of EMR . Over the next 30 days, patient will verbalize using fall risk reduction strategies discussed . Over the next 90 days, patient will not experience additional falls . Over the next 30 days, patient will continue to work with PT to build up strength and stability  Interventions:  . Provided written and verbal education re: Potential causes of falls and Fall prevention strategies . Reviewed medications and discussed potential side effects of medications such as dizziness and frequent urination . Assessed for s/s of orthostatic hypotension . Assessed for falls since last encounter. . Assessed patients knowledge of fall risk prevention secondary to previously provided education.  Patient Self Care Activities:  . Utilize wheelchair (assistive device) appropriately with all ambulation .   De-clutter walkways . Change positions slowly . Wear secure fitting shoes at all times with ambulation . Utilize home lighting for dim lit areas . Have self and pet awareness at all times  Plan: . CCM RN CM will follow up in 30   Initial goal documentation     . Oxygen Therapy       Current Barriers:  . Need for increased oxygen therapy  Nurse Case Manager Clinical Goal(s):  [name] Over the next 24 hours, patient will increase oxygen to 3L/min . Over the next 7 days, patient will see improvement in oxygen levels and shortness of breath  Interventions:  . Chart  reviewed . Advised that order for increased oxygen was sent to [facility] . Evaluation of current treatment plan related to oxygen therapy and patient's adherence to plan as established by provider. . Advised patient to increase oxygen to 3L/min per Dr Marjean Donna instructions . Discussed plans with patient for ongoing care management follow up and provided patient with direct contact information for care management team  Patient Self Care Activities:  . Self administers medications as prescribed . Calls provider office for new concerns or questions . Unable to independently walk or perform most ADLs  Initial goal documentation       Mr. Markey was given information about Chronic Care Management services today including:  1. CCM service includes personalized support from designated clinical staff supervised by his physician, including individualized plan of care and coordination with other care providers 2. 24/7 contact phone numbers for assistance for urgent and routine care needs. 3. Service will only be billed when office clinical staff spend 20 minutes or more in a month to coordinate care. 4. Only one practitioner may furnish and bill the service in a calendar month. 5. The patient may stop CCM services at any time (effective at the end of the month) by phone call to the office staff. 6. The patient will be responsible for cost sharing (co-pay) of up to 20% of the service fee (after annual deductible is met).  Patient agreed to services and verbal consent obtained.   Plan:    The care management team will reach out to the patient again over the next 1 days.    Patient/wife provided with RN CCM direct contact information  Chong Sicilian, BSN, RN-BC [place] / [place] Management Direct Dial: [phone]

## 2019-04-28 NOTE — Patient Instructions (Signed)
Visit Information  Goals Addressed            This Visit's Progress     Patient Stated   . Assistance with Transportation (pt-stated)       Current Barriers:  [name] Knowledge Deficits related to transportation assistance  Nurse Case Manager Clinical Goal(s):  [name] Over the next 7 days, patient/wife will verbalize understanding of available transportation assistance  Interventions:  . Discussed [facility] . RN will contact [facility] [phone]) to see if they transport to [facility] and if they are able to assist patient to get him out of the home and into the [place] o They have a ramp but it's too steep for the wife to maneuver the wheelchair safely . Discussed upcoming appointments next week on the 19th and 20th  Patient Self Care Activities:  . Self administers medications as prescribed . Calls provider office for new concerns or questions . Wife provides most care. Patient is wheelchair bound . Unable to independently walk   Initial goal documentation       Other   . Chronic Disease Management Needs       Current Barriers:  . Chronic Disease Management support, education, and care coordination needs related to HTN, CAD, AAA, CHF, COPD, OSA, DM, hyperlipidemia, anxiety  Clinical Goal(s) related to HTN, CAD, AAA, CHF, COPD, OSA, DM, hyperlipidemia, anxiety:  Over the next 30 days, patient will:  . Work with the care management team to address educational, disease management, and care coordination needs  . Begin or continue self health monitoring activities as directed today Measure and record weight daily . Call provider office for new or worsened signs and symptoms Weight outside established parameters . Call care management team with questions or concerns . Verbalize basic understanding of patient centered plan of care established today  Interventions related to HTN, CAD, AAA, CHF, COPD, OSA, DM, hyperlipidemia, anxiety:  . Evaluation of current treatment plans  and patient's adherence to plan as established by provider . Assessed patient understanding of disease states . Assessed patient's education and care coordination needs . Provided disease specific education to patient  . Collaborated with appropriate clinical care team members regarding patient needs  Patient Self Care Activities related to HTN, CAD, AAA, CHF, COPD, OSA, DM, hyperlipidemia, anxiety:  . Patient is unable to independently self-manage chronic health conditions  Initial goal documentation     . High Risk for Falls       Current Barriers:  [name] Knowledge Deficits related to fall precautions . Decreased adherence to prescribed treatment for fall prevention . Wife is much smaller and isn't able to help him transfer well  Nurse Case Manager Clinical Goal(s):  [name] Over the next 30 days, patient will demonstrate improved adherence to prescribed treatment plan for decreasing falls as evidenced by patient reporting and review of EMR . Over the next 30 days, patient will verbalize using fall risk reduction strategies discussed . Over the next 90 days, patient will not experience additional falls . Over the next 30 days, patient will continue to work with PT to build up strength and stability  Interventions:  . Provided written and verbal education re: Potential causes of falls and Fall prevention strategies . Reviewed medications and discussed potential side effects of medications such as dizziness and frequent urination . Assessed for s/s of orthostatic hypotension . Assessed for falls since last encounter. . Assessed patients knowledge of fall risk prevention secondary to previously provided education.  Patient Self Care  Activities:  . Utilize wheelchair (assistive device) appropriately with all ambulation . De-clutter walkways . Change positions slowly . Wear secure fitting shoes at all times with ambulation . Utilize home lighting for dim lit areas . Have self and pet  awareness at all times  Plan: . CCM RN CM will follow up in 30   Initial goal documentation     . Oxygen Therapy       Current Barriers:  . Need for increased oxygen therapy  Nurse Case Manager Clinical Goal(s):  [name] Over the next 24 hours, patient will increase oxygen to 3L/min . Over the next 7 days, patient will see improvement in oxygen levels and shortness of breath  Interventions:  . Chart reviewed . Advised that order for increased oxygen was sent to [facility] . Evaluation of current treatment plan related to oxygen therapy and patient's adherence to plan as established by provider. . Advised patient to increase oxygen to 3L/min per Dr Marjean Donna instructions . Discussed plans with patient for ongoing care management follow up and provided patient with direct contact information for care management team  Patient Self Care Activities:  . Self administers medications as prescribed . Calls provider office for new concerns or questions . Unable to independently walk or perform most ADLs  Initial goal documentation       The care management team will reach out to the patient again over the next 1 days.   Mr. Urbas was given information about Chronic Care Management services today including:  1. CCM service includes personalized support from designated clinical staff supervised by his physician, including individualized plan of care and coordination with other care providers 2. 24/7 contact phone numbers for assistance for urgent and routine care needs. 3. Service will only be billed when office clinical staff spend 20 minutes or more in a month to coordinate care. 4. Only one practitioner may furnish and bill the service in a calendar month. 5. The patient may stop CCM services at any time (effective at the end of the month) by phone call to the office staff. 6. The patient will be responsible for cost sharing (co-pay) of up to 20% of the service fee (after  annual deductible is met).  Patient agreed to services and verbal consent obtained.   Chong Sicilian, BSN, RN-BC Embedded Chronic Care Manager [facility] / [place] Management Direct Dial: [phone]   The patient verbalized understanding of instructions provided today and declined a print copy of patient instruction materials.

## 2019-04-28 NOTE — Telephone Encounter (Signed)
Yes, please give verbal for 3L.  Keep close eye on weights given recent fluid overload.  May need to see lung specialist at some point.

## 2019-04-29 ENCOUNTER — Ambulatory Visit: Payer: Medicare HMO | Admitting: *Deleted

## 2019-04-29 DIAGNOSIS — I5030 Unspecified diastolic (congestive) heart failure: Secondary | ICD-10-CM | POA: Diagnosis not present

## 2019-04-29 DIAGNOSIS — D124 Benign neoplasm of descending colon: Secondary | ICD-10-CM | POA: Diagnosis not present

## 2019-04-29 DIAGNOSIS — K2270 Barrett's esophagus without dysplasia: Secondary | ICD-10-CM | POA: Diagnosis not present

## 2019-04-29 DIAGNOSIS — Z8719 Personal history of other diseases of the digestive system: Secondary | ICD-10-CM | POA: Diagnosis not present

## 2019-04-29 DIAGNOSIS — I10 Essential (primary) hypertension: Secondary | ICD-10-CM | POA: Diagnosis not present

## 2019-04-29 DIAGNOSIS — I959 Hypotension, unspecified: Secondary | ICD-10-CM | POA: Diagnosis not present

## 2019-04-29 DIAGNOSIS — I130 Hypertensive heart and chronic kidney disease with heart failure and stage 1 through stage 4 chronic kidney disease, or unspecified chronic kidney disease: Secondary | ICD-10-CM | POA: Diagnosis not present

## 2019-04-29 DIAGNOSIS — R042 Hemoptysis: Secondary | ICD-10-CM | POA: Diagnosis not present

## 2019-04-29 DIAGNOSIS — I2510 Atherosclerotic heart disease of native coronary artery without angina pectoris: Secondary | ICD-10-CM | POA: Diagnosis not present

## 2019-04-29 DIAGNOSIS — Y848 Other medical procedures as the cause of abnormal reaction of the patient, or of later complication, without mention of misadventure at the time of the procedure: Secondary | ICD-10-CM | POA: Diagnosis not present

## 2019-04-29 DIAGNOSIS — K5730 Diverticulosis of large intestine without perforation or abscess without bleeding: Secondary | ICD-10-CM | POA: Diagnosis not present

## 2019-04-29 DIAGNOSIS — D62 Acute posthemorrhagic anemia: Secondary | ICD-10-CM | POA: Diagnosis not present

## 2019-04-29 DIAGNOSIS — J9611 Chronic respiratory failure with hypoxia: Secondary | ICD-10-CM | POA: Diagnosis not present

## 2019-04-29 DIAGNOSIS — K208 Other esophagitis without bleeding: Secondary | ICD-10-CM | POA: Diagnosis not present

## 2019-04-29 DIAGNOSIS — K529 Noninfective gastroenteritis and colitis, unspecified: Secondary | ICD-10-CM | POA: Diagnosis not present

## 2019-04-29 DIAGNOSIS — K922 Gastrointestinal hemorrhage, unspecified: Secondary | ICD-10-CM | POA: Diagnosis not present

## 2019-04-29 DIAGNOSIS — I5023 Acute on chronic systolic (congestive) heart failure: Secondary | ICD-10-CM | POA: Diagnosis not present

## 2019-04-29 DIAGNOSIS — Z7901 Long term (current) use of anticoagulants: Secondary | ICD-10-CM | POA: Diagnosis not present

## 2019-04-29 DIAGNOSIS — I5031 Acute diastolic (congestive) heart failure: Secondary | ICD-10-CM | POA: Diagnosis not present

## 2019-04-29 DIAGNOSIS — J449 Chronic obstructive pulmonary disease, unspecified: Secondary | ICD-10-CM

## 2019-04-29 DIAGNOSIS — Z4682 Encounter for fitting and adjustment of non-vascular catheter: Secondary | ICD-10-CM | POA: Diagnosis not present

## 2019-04-29 DIAGNOSIS — Z7409 Other reduced mobility: Secondary | ICD-10-CM

## 2019-04-29 DIAGNOSIS — I5033 Acute on chronic diastolic (congestive) heart failure: Secondary | ICD-10-CM | POA: Diagnosis not present

## 2019-04-29 DIAGNOSIS — J9621 Acute and chronic respiratory failure with hypoxia: Secondary | ICD-10-CM | POA: Diagnosis not present

## 2019-04-29 DIAGNOSIS — I480 Paroxysmal atrial fibrillation: Secondary | ICD-10-CM | POA: Diagnosis not present

## 2019-04-29 DIAGNOSIS — I493 Ventricular premature depolarization: Secondary | ICD-10-CM | POA: Diagnosis not present

## 2019-04-29 DIAGNOSIS — Z9861 Coronary angioplasty status: Secondary | ICD-10-CM | POA: Diagnosis not present

## 2019-04-29 DIAGNOSIS — Z955 Presence of coronary angioplasty implant and graft: Secondary | ICD-10-CM | POA: Diagnosis not present

## 2019-04-29 DIAGNOSIS — E782 Mixed hyperlipidemia: Secondary | ICD-10-CM | POA: Diagnosis not present

## 2019-04-29 DIAGNOSIS — Z87891 Personal history of nicotine dependence: Secondary | ICD-10-CM | POA: Diagnosis not present

## 2019-04-29 DIAGNOSIS — J811 Chronic pulmonary edema: Secondary | ICD-10-CM | POA: Diagnosis not present

## 2019-04-29 DIAGNOSIS — I272 Pulmonary hypertension, unspecified: Secondary | ICD-10-CM | POA: Diagnosis not present

## 2019-04-29 DIAGNOSIS — R918 Other nonspecific abnormal finding of lung field: Secondary | ICD-10-CM | POA: Diagnosis not present

## 2019-04-29 DIAGNOSIS — R195 Other fecal abnormalities: Secondary | ICD-10-CM | POA: Diagnosis not present

## 2019-04-29 DIAGNOSIS — K635 Polyp of colon: Secondary | ICD-10-CM | POA: Diagnosis not present

## 2019-04-29 DIAGNOSIS — R0602 Shortness of breath: Secondary | ICD-10-CM | POA: Diagnosis not present

## 2019-04-29 DIAGNOSIS — K2971 Gastritis, unspecified, with bleeding: Secondary | ICD-10-CM | POA: Diagnosis not present

## 2019-04-29 DIAGNOSIS — Z9981 Dependence on supplemental oxygen: Secondary | ICD-10-CM | POA: Diagnosis not present

## 2019-04-29 DIAGNOSIS — J90 Pleural effusion, not elsewhere classified: Secondary | ICD-10-CM | POA: Diagnosis not present

## 2019-04-29 DIAGNOSIS — K2970 Gastritis, unspecified, without bleeding: Secondary | ICD-10-CM | POA: Diagnosis not present

## 2019-04-29 DIAGNOSIS — K317 Polyp of stomach and duodenum: Secondary | ICD-10-CM | POA: Diagnosis not present

## 2019-04-29 DIAGNOSIS — K5731 Diverticulosis of large intestine without perforation or abscess with bleeding: Secondary | ICD-10-CM | POA: Diagnosis not present

## 2019-04-29 DIAGNOSIS — D122 Benign neoplasm of ascending colon: Secondary | ICD-10-CM | POA: Diagnosis not present

## 2019-04-29 DIAGNOSIS — D500 Iron deficiency anemia secondary to blood loss (chronic): Secondary | ICD-10-CM | POA: Diagnosis not present

## 2019-04-29 DIAGNOSIS — K819 Cholecystitis, unspecified: Secondary | ICD-10-CM | POA: Diagnosis not present

## 2019-04-29 DIAGNOSIS — F322 Major depressive disorder, single episode, severe without psychotic features: Secondary | ICD-10-CM | POA: Diagnosis not present

## 2019-04-29 DIAGNOSIS — K921 Melena: Secondary | ICD-10-CM | POA: Diagnosis not present

## 2019-04-29 DIAGNOSIS — D132 Benign neoplasm of duodenum: Secondary | ICD-10-CM | POA: Diagnosis not present

## 2019-04-29 DIAGNOSIS — Z952 Presence of prosthetic heart valve: Secondary | ICD-10-CM | POA: Diagnosis not present

## 2019-04-29 DIAGNOSIS — D649 Anemia, unspecified: Secondary | ICD-10-CM | POA: Diagnosis not present

## 2019-04-29 DIAGNOSIS — Z794 Long term (current) use of insulin: Secondary | ICD-10-CM | POA: Diagnosis not present

## 2019-04-29 DIAGNOSIS — R0902 Hypoxemia: Secondary | ICD-10-CM | POA: Diagnosis not present

## 2019-04-29 DIAGNOSIS — E119 Type 2 diabetes mellitus without complications: Secondary | ICD-10-CM

## 2019-04-29 DIAGNOSIS — I5022 Chronic systolic (congestive) heart failure: Secondary | ICD-10-CM | POA: Diagnosis not present

## 2019-04-29 DIAGNOSIS — I509 Heart failure, unspecified: Secondary | ICD-10-CM | POA: Diagnosis not present

## 2019-04-29 DIAGNOSIS — I4891 Unspecified atrial fibrillation: Secondary | ICD-10-CM | POA: Diagnosis not present

## 2019-04-29 DIAGNOSIS — D120 Benign neoplasm of cecum: Secondary | ICD-10-CM | POA: Diagnosis not present

## 2019-04-29 DIAGNOSIS — E873 Alkalosis: Secondary | ICD-10-CM | POA: Diagnosis not present

## 2019-04-29 DIAGNOSIS — R531 Weakness: Secondary | ICD-10-CM | POA: Diagnosis not present

## 2019-04-29 DIAGNOSIS — I5032 Chronic diastolic (congestive) heart failure: Secondary | ICD-10-CM | POA: Diagnosis not present

## 2019-04-29 DIAGNOSIS — I2722 Pulmonary hypertension due to left heart disease: Secondary | ICD-10-CM | POA: Diagnosis not present

## 2019-04-29 DIAGNOSIS — D123 Benign neoplasm of transverse colon: Secondary | ICD-10-CM | POA: Diagnosis not present

## 2019-04-29 DIAGNOSIS — Z748 Other problems related to care provider dependency: Secondary | ICD-10-CM

## 2019-04-29 DIAGNOSIS — T85638A Leakage of other specified internal prosthetic devices, implants and grafts, initial encounter: Secondary | ICD-10-CM | POA: Diagnosis not present

## 2019-04-29 DIAGNOSIS — I5043 Acute on chronic combined systolic (congestive) and diastolic (congestive) heart failure: Secondary | ICD-10-CM | POA: Diagnosis not present

## 2019-04-29 DIAGNOSIS — E1165 Type 2 diabetes mellitus with hyperglycemia: Secondary | ICD-10-CM | POA: Diagnosis not present

## 2019-04-29 DIAGNOSIS — Z20822 Contact with and (suspected) exposure to covid-19: Secondary | ICD-10-CM | POA: Diagnosis not present

## 2019-04-29 DIAGNOSIS — Z934 Other artificial openings of gastrointestinal tract status: Secondary | ICD-10-CM | POA: Diagnosis not present

## 2019-04-29 DIAGNOSIS — K2980 Duodenitis without bleeding: Secondary | ICD-10-CM | POA: Diagnosis not present

## 2019-04-29 DIAGNOSIS — I350 Nonrheumatic aortic (valve) stenosis: Secondary | ICD-10-CM | POA: Diagnosis not present

## 2019-04-29 DIAGNOSIS — R69 Illness, unspecified: Secondary | ICD-10-CM | POA: Diagnosis not present

## 2019-04-29 NOTE — Chronic Care Management (AMB) (Signed)
  Chronic Care Management   Follow Up Note   04/29/2019 Name: Matthew Campos MRN: [id number] DOB: 1940/05/29  Referred by: [name], DO Reason for referral : Chronic Care Management (Care Coordination)   ADDENDUM: RN Care Plan   . Assistance with Transportation (pt-stated)       Transportation assistance needs in patient with CHF, COPD, and DM.  Current Barriers:  [name] Knowledge Deficits related to transportation assistance  Nurse Case Manager Clinical Goal(s):  [name] Over the next 7 days, patient/wife will verbalize understanding of available transportation assistance  Interventions:  . Connected Care referral cancelled . Patient provided with [employer] contact information [phone]. They are partnered with [facility] and offer 24 rides per year up to 60 miles per ride at no additional charge.  . Explained to patient/wife and asked that they give them a call today to setup transportation to doctor's appointments next week  Patient Self Care Activities:  . Self administers medications as prescribed . Calls provider office for new concerns or questions . Wife provides most care. Patient is wheelchair bound . Unable to independently walk   Please see past updates related to this goal by clicking on the "Past Updates" button in the selected goal         Follow-up Plan The care management team will reach out to the patient again over the next 7 days.   Matthew Campos, BSN, RN-BC Embedded Chronic Care Manager [facility] / [place] Management Direct Dial: [phone]

## 2019-04-29 NOTE — Patient Instructions (Signed)
Visit Information  Goals Addressed            This Visit's Progress     Patient Stated   . Assistance with Transportation (pt-stated)       Transportation assistance needs in patient with CHF, COPD, and DM.  Current Barriers:  [name] Knowledge Deficits related to transportation assistance  Nurse Case Manager Clinical Goal(s):  [name] Over the next 7 days, patient/wife will verbalize understanding of available transportation assistance  Interventions:  . Researched [facility] and they only transport within [facility] . Referral placed to Connected Care Dept for assistance with finding transportation from [place] to [facility] for appointments next week  Patient Self Care Activities:  . Self administers medications as prescribed . Calls provider office for new concerns or questions . Wife provides most care. Patient is wheelchair bound . Unable to independently walk   Please see past updates related to this goal by clicking on the "Past Updates" button in the selected goal       The care management team will reach out to the patient again over the next 2 days.    Chong Sicilian, BSN, RN-BC Embedded Chronic Care Manager [facility] / [place] Management Direct Dial: [phone]   The patient verbalized understanding of instructions provided today and declined a print copy of patient instruction materials.

## 2019-04-29 NOTE — Telephone Encounter (Signed)
LMOVM to Remo Lipps w/ [facility] to give 3L of O2

## 2019-04-29 NOTE — Chronic Care Management (AMB) (Signed)
  Chronic Care Management   Follow Up Note   04/29/2019 Name: Matthew Campos MRN: [id number] DOB: 12-31-1940  Referred by: [name], DO Reason for referral : Chronic Care Management (Care Coordination)   Matthew Campos is a 79 y.o. year old male who is a primary care patient of [name], DO. The CCM team was consulted for assistance with chronic disease management and care coordination needs.    Review of patient status, including review of consultants reports, relevant laboratory and other test results, and collaboration with appropriate care team members and the patient's provider was performed as part of comprehensive patient evaluation and provision of chronic care management services.      RN Assessment & Plan   . Assistance with Transportation (pt-stated)       Transportation assistance needs in patient with CHF, COPD, and DM.  Current Barriers:  [name] Knowledge Deficits related to transportation assistance  Nurse Case Manager Clinical Goal(s):  [name] Over the next 7 days, patient/wife will verbalize understanding of available transportation assistance  Interventions:  . Researched [facility] and they only transport within [facility] . Urgent referral placed to [place] for assistance with finding transportation from [place] to [facility] for appointments next week  Patient Self Care Activities:  . Self administers medications as prescribed . Calls provider office for new concerns or questions . Wife provides most care. Patient is wheelchair bound . Unable to independently walk   Please see past updates related to this goal by clicking on the "Past Updates" button in the selected goal         Follow-up Plan The care management team will reach out to the patient again over the next 2 days.    Chong Sicilian, BSN, RN-BC Embedded Chronic Care Manager [facility] / [place] Management Direct Dial: [phone]

## 2019-05-03 ENCOUNTER — Ambulatory Visit: Payer: Medicare HMO | Admitting: *Deleted

## 2019-05-03 NOTE — Chronic Care Management (AMB) (Signed)
  Chronic Care Management   Outreach Note  05/03/2019 Name: Matthew Campos MRN: [id number] DOB: Jun 28, 1940  Referred by: [name], DO Reason for referral : Chronic Care Management (RN f/u on transportation)   An unsuccessful telephone follow-up was attempted today regarding transportation assistance. The patient was referred to the case management team by for assistance with care management and care coordination.   Follow Up Plan: The care management team will reach out to the patient again over the next 2 days.   Chong Sicilian, BSN, RN-BC Embedded Chronic Care Manager [facility] / [place] Management Direct Dial: [phone]

## 2019-05-04 ENCOUNTER — Ambulatory Visit: Payer: Medicare HMO | Admitting: *Deleted

## 2019-05-04 DIAGNOSIS — K819 Cholecystitis, unspecified: Secondary | ICD-10-CM | POA: Diagnosis not present

## 2019-05-04 DIAGNOSIS — Z4682 Encounter for fitting and adjustment of non-vascular catheter: Secondary | ICD-10-CM | POA: Diagnosis not present

## 2019-05-04 NOTE — Chronic Care Management (AMB) (Signed)
  Chronic Care Management   Outreach Note  05/04/2019 Name: Matthew Campos MRN: [id number] DOB: Oct 26, 1940  Referred by: [name], DO Reason for referral : Chronic Care Management (RN follow-up on Transportation Assistance)   An unsuccessful telephone follow-up was attempted today. I would like to discuss transportation assistance and verify that they were able to arrange transportation to his medical appointments through [place]. The patient was referred to the case management team by for assistance with care management and care coordination.   Follow Up Plan: The care management team will reach out to the patient again over the next 2 days.   Chong Sicilian, BSN, RN-BC Embedded Chronic Care Manager [facility] / [place] Management Direct Dial: [phone]

## 2019-05-05 ENCOUNTER — Ambulatory Visit (INDEPENDENT_AMBULATORY_CARE_PROVIDER_SITE_OTHER): Payer: Medicare HMO | Admitting: *Deleted

## 2019-05-05 DIAGNOSIS — I509 Heart failure, unspecified: Secondary | ICD-10-CM

## 2019-05-05 DIAGNOSIS — E119 Type 2 diabetes mellitus without complications: Secondary | ICD-10-CM

## 2019-05-05 DIAGNOSIS — J449 Chronic obstructive pulmonary disease, unspecified: Secondary | ICD-10-CM

## 2019-05-05 DIAGNOSIS — Z748 Other problems related to care provider dependency: Secondary | ICD-10-CM

## 2019-05-05 NOTE — Patient Instructions (Signed)
Visit Information  Goals Addressed            This Visit's Progress     Patient Stated   . Assistance with Transportation (pt-stated)       Transportation assistance needs in patient with CHF, COPD, and DM.  Current Barriers:  [name] Knowledge Deficits related to transportation assistance  Nurse Case Manager Clinical Goal(s):  [name] Over the next 7 days, patient/wife will verbalize understanding of available transportation assistance  Interventions:  . Spoke with wife about transportation assistance o She was able to get help through [place] . Connected Care referral placed for assistance with transportation to appointments in [facility]  Patient Self Care Activities:  . Self administers medications as prescribed . Calls provider office for new concerns or questions . Wife provides most care. Patient is wheelchair bound . Unable to independently walk   Please see past updates related to this goal by clicking on the "Past Updates" button in the selected goal        Other   . Caregiver Strain       Current Barriers:  . Lacks caregiver support.  . Film/video editor.  . Transportation barriers  Nurse Case Manager Clinical Goal(s):  [name] Over the next 30 days, wife will talk with LCSW regarding caregiver strain  Interventions:  . Discussed current stress . Recommended that she talk with Theadore Nan, LCSW. She will consider it.   Initial goal documentation        The care management team will reach out to the patient again over the next 7 days.   Chong Sicilian, BSN, RN-BC Embedded Chronic Care Manager [facility] [facility] Direct Dial: [phone]   The patient verbalized understanding of instructions provided today and declined a print copy of patient instruction materials.    [name] was given information about Chronic Care Management services today including:  1. CCM service includes personalized support from designated  clinical staff supervised by his physician, including individualized plan of care and coordination with other care providers 2. 24/7 contact phone numbers for assistance for urgent and routine care needs. 3. Service will only be billed when office clinical staff spend 20 minutes or more in a month to coordinate care. 4. Only one practitioner may furnish and bill the service in a calendar month. 5. The patient may stop CCM services at any time (effective at the end of the month) by phone call to the office staff. 6. The patient will be responsible for cost sharing (co-pay) of up to 20% of the service fee (after annual deductible is met).  Patient agreed to services and verbal consent obtained.

## 2019-05-05 NOTE — Chronic Care Management (AMB) (Signed)
Chronic Care Management   Follow Up Note   05/05/2019 Name: Matthew Campos MRN: [id number] DOB: November 24, 1940  Referred by: [name], DO Reason for referral : Chronic Care Management (Transportation Assistance)   Matthew Campos is a 79 y.o. year old male who is a primary care patient of [name], DO. The CCM team was consulted for assistance with chronic disease management and care coordination needs.    Review of patient status, including review of consultants reports, relevant laboratory and other test results, and collaboration with appropriate care team members and the patient's provider was performed as part of comprehensive patient evaluation and provision of chronic care management services.    SDOH (Social Determinants of Health) screening performed today: Transportation Stress Physical Activity. See Care Plan for related entries.   Outpatient Encounter Medications as of 05/05/2019  Medication Sig  . albuterol (PROVENTIL) (2.5 MG/3ML) 0.083% nebulizer solution Take 2.5 mg by nebulization every 6 (six) hours as needed for wheezing or shortness of breath.  [name] albuterol (VENTOLIN HFA) 108 (90 Base) MCG/ACT inhaler Inhale 2 puffs into the lungs every 6 (six) hours as needed for wheezing or shortness of breath.  [name] aspirin 81 MG chewable tablet Chew 81 mg by mouth daily.  . Blood Glucose Monitoring Suppl (ONETOUCH VERIO) w/Device KIT TID  . budesonide-formoterol (SYMBICORT) 80-4.5 MCG/ACT inhaler Inhale 2 puffs into the lungs 2 (two) times daily.  . Calcium Carbonate-Simethicone 750-80 MG CHEW Chew 1 tablet by mouth as needed.  . cephALEXin (KEFLEX) 500 MG capsule Take 500 mg by mouth 4 (four) times daily.  . clopidogrel (PLAVIX) 75 MG tablet Take 1 tablet (75 mg total) by mouth daily. (Patient not taking: Reported on 04/22/2019)  . escitalopram (LEXAPRO) 10 MG tablet TAKE ONE (1) TABLET EACH DAY  . glipiZIDE (GLUCOTROL XL) 10 MG 24 hr tablet   . glucose blood  (ONETOUCH VERIO) test strip TID Dx E11.9  . levothyroxine (SYNTHROID) 150 MCG tablet Take 150 mcg by mouth daily before breakfast.  . metFORMIN (GLUCOPHAGE XR) 500 MG 24 hr tablet Take 2 tablets (1,000 mg total) by mouth 2 (two) times daily. (Patient not taking: Reported on 04/22/2019)  . metoprolol tartrate (LOPRESSOR) 50 MG tablet Take 1 tablet (50 mg total) by mouth 2 (two) times daily. (Patient not taking: Reported on 04/22/2019)  . Multiple Vitamin (MULTIVITAMIN WITH MINERALS) TABS Take 1 tablet by mouth daily.  . nitroGLYCERIN (NITROSTAT) 0.4 MG SL tablet Place 0.4 mg under the tongue every 5 (five) minutes as needed. For chest pain  . ofloxacin (OCUFLOX) 0.3 % ophthalmic solution every morning.  [name] omeprazole (PRILOSEC) 40 MG capsule TAKE ONE (1) CAPSULE EACH DAY (Patient not taking: Reported on 04/22/2019)  . ondansetron (ZOFRAN-ODT) 4 MG disintegrating tablet TAKE 1 TABLET EVERY 8 HOURS AS NEEDED FOR NAUSEA AND VOMITING  . pantoprazole (PROTONIX) 20 MG tablet Take 20 mg by mouth 2 (two) times daily.  . rosuvastatin (CRESTOR) 20 MG tablet Take 1 tablet (20 mg total) by mouth daily.  . sertraline (ZOLOFT) 25 MG tablet Take 1 tablet (25 mg total) by mouth daily.  . sitaGLIPtin (JANUVIA) 50 MG tablet Take 1 tablet (50 mg total) by mouth 2 (two) times a day.  . torsemide (DEMADEX) 20 MG tablet Take 2 tablets (40 mg total) by mouth 2 (two) times daily.   No facility-administered encounter medications on file as of 05/05/2019.     RN Care Plan   . Assistance with Transportation (pt-stated)  Transportation assistance needs in patient with CHF, COPD, and DM.  Current Barriers:  [name] Knowledge Deficits related to transportation assistance  Nurse Case Manager Clinical Goal(s):  [name] Over the next 7 days, patient/wife will verbalize understanding of available transportation assistance  Interventions:  . Spoke with wife about transportation assistance o She was able to get help through  [place] . Connected Care referral placed for assistance with transportation to appointments in [facility]  Patient Self Care Activities:  . Self administers medications as prescribed . Calls provider office for new concerns or questions . Wife provides most care. Patient is wheelchair bound . Unable to independently walk   Please see past updates related to this goal by clicking on the "Past Updates" button in the selected goal        . Caregiver Strain       Current Barriers:  . Lacks caregiver support.  . Film/video editor.  . Transportation barriers  Nurse Case Manager Clinical Goal(s):  [name] Over the next 30 days, wife will talk with LCSW regarding caregiver strain  Interventions:  . Discussed current stress . Recommended that she talk with Theadore Nan, LCSW. She will consider it.   Initial goal documentation         Plan:  The care management team will reach out to the patient again over the next 7 days.    Chong Sicilian, BSN, RN-BC Embedded Chronic Care Manager [facility] Direct Dial: [phone]

## 2019-05-10 ENCOUNTER — Telehealth: Payer: Self-pay

## 2019-05-10 NOTE — Telephone Encounter (Signed)
05/10/2019 Spoke with patient's wife  about [employer] transportation, patient is in the hospital.  Let her know I have contacted [employer] and am waiting for them to return my call. [phone]

## 2019-05-13 ENCOUNTER — Ambulatory Visit: Payer: Self-pay | Admitting: Licensed Clinical Social Worker

## 2019-05-13 DIAGNOSIS — E119 Type 2 diabetes mellitus without complications: Secondary | ICD-10-CM | POA: Diagnosis not present

## 2019-05-13 DIAGNOSIS — F322 Major depressive disorder, single episode, severe without psychotic features: Secondary | ICD-10-CM

## 2019-05-13 DIAGNOSIS — I10 Essential (primary) hypertension: Secondary | ICD-10-CM

## 2019-05-13 DIAGNOSIS — I2510 Atherosclerotic heart disease of native coronary artery without angina pectoris: Secondary | ICD-10-CM

## 2019-05-13 DIAGNOSIS — I509 Heart failure, unspecified: Secondary | ICD-10-CM | POA: Diagnosis not present

## 2019-05-13 DIAGNOSIS — E782 Mixed hyperlipidemia: Secondary | ICD-10-CM

## 2019-05-13 DIAGNOSIS — J449 Chronic obstructive pulmonary disease, unspecified: Secondary | ICD-10-CM

## 2019-05-13 DIAGNOSIS — K219 Gastro-esophageal reflux disease without esophagitis: Secondary | ICD-10-CM

## 2019-05-13 DIAGNOSIS — R69 Illness, unspecified: Secondary | ICD-10-CM | POA: Diagnosis not present

## 2019-05-13 NOTE — Chronic Care Management (AMB) (Signed)
Care Management Note   Matthew Campos is a 79 y.o. year old male who is a primary care patient of [name], DO. The CM team was consulted for assistance with chronic disease management and care coordination.   I reached out to Genevie Ann, spouse, by phone today.   Review of patient status, including review of consultants reports, relevant laboratory and other test results, and collaboration with appropriate care team members and the patient's provider was performed as part of comprehensive patient evaluation and provision of chronic care management services.   Social determinants of health: risk of social Isolation; risk of tobacco use; risk of depression; risk of stress; risk of physical inactivity; risk of transport needs    Chronic Care Management from 05/13/2019 in [facility]  PHQ-9 Total Score  6     GAD 7 : Generalized Anxiety Score 05/13/2019 10/28/2018  Nervous, Anxious, on Edge 1 3  Control/stop worrying 1 3  Worry too much - different things 1 3  Trouble relaxing 1 0  Restless 1 3  Easily annoyed or irritable 0 3  Afraid - awful might happen 0 0  Total GAD 7 Score 5 15  Anxiety Difficulty Somewhat difficult Not difficult at all   Medications   (very important)  New medications from outside sources are available for reconciliation  albuterol (PROVENTIL) (2.5 MG/3ML) 0.083% nebulizer solution albuterol (VENTOLIN HFA) 108 (90 Base) MCG/ACT inhaler aspirin 81 MG chewable tablet Blood Glucose Monitoring Suppl (ONETOUCH VERIO) w/Device KIT budesonide-formoterol (SYMBICORT) 80-4.5 MCG/ACT inhaler Calcium Carbonate-Simethicone 750-80 MG CHEW cephALEXin (KEFLEX) 500 MG capsule clopidogrel (PLAVIX) 75 MG tablet escitalopram (LEXAPRO) 10 MG tablet glipiZIDE (GLUCOTROL XL) 10 MG 24 hr tablet glucose blood (ONETOUCH VERIO) test strip levothyroxine (SYNTHROID) 150 MCG tablet metFORMIN (GLUCOPHAGE XR) 500 MG 24 hr  tablet metoprolol tartrate (LOPRESSOR) 50 MG tablet Multiple Vitamin (MULTIVITAMIN WITH MINERALS) TABS nitroGLYCERIN (NITROSTAT) 0.4 MG SL tablet ofloxacin (OCUFLOX) 0.3 % ophthalmic solution omeprazole (PRILOSEC) 40 MG capsule ondansetron (ZOFRAN-ODT) 4 MG disintegrating tablet pantoprazole (PROTONIX) 20 MG tablet rosuvastatin (CRESTOR) 20 MG tablet sertraline (ZOLOFT) 25 MG tablet sitaGLIPtin (JANUVIA) 50 MG tablet torsemide (DEMADEX) 20 MG tablet  Goals Addressed            This Visit's Progress   . Client will talk with LCSW in next 30 days to discuss depression symptoms of client (pt-stated)       Current Barriers:  . Breathing challenges of client . Transportation challenges in client with Chronic Diagnoses of COPD, CAD, GERD, HLD, HTN, MDD, DM . Mobility challenges  Clinical Social Work Clinical Goal(s):  [name] LCSW will talk with client in next 4 weeks to discuss client management of depression symptoms faced  Interventions: . Talked with Lubertha Sayres, spouse, about transport needs of client . Talked with Apolonio Schneiders about current client needs (client is currently a patient at [facility] . Talked with [name] about RNCM support and LCSW support . Talked with [name] about Medicaid application process for [employer] . Talked with [place] about financial needs of client . Talked with [name] about DME that client already has at his home (has 3 in 1 BSC, lift chair, bed that elevates head and feet, walker, cane, wheelchair, nebulizer, oxygen concentrator) . Talked with [place] about physical therapy participation of client  Patient Self Care Activities:  . Enjoys speaking via phone with his wife  . CCM SELF CARE DEFICITS:       Needs  help with daily ADLs       Needs help with medication administration  Initial Care Plan documentation     Follow Up Plan:  LCSW to call client/spouse in next 4 weeks to talk with client/spouse about client  management of depression symptoms faced.   [name].Jahmeek Shirk MSW, LCSW Licensed Clinical Social Worker [facility] [phone]

## 2019-05-13 NOTE — Patient Instructions (Addendum)
Licensed Clinical Social Worker Visit Information  Goals we discussed today:  Goals Addressed            This Visit's Progress   . Client will talk with LCSW in next 30 days to discuss depression symptoms of client (pt-stated)       Current Barriers:  . Breathing challenges of client . Transportation challenges in client with Chronic Diagnoses of COPD, CAD, GERD, HLD, HTN, MDD, DM . Mobility challenges  Clinical Social Work Clinical Goal(s):  [name] LCSW will talk with client in next 4 weeks to discuss client management of depression symptoms faced  Interventions: . Talked with Lubertha Sayres, spouse, about transport needs of client . Talked with Apolonio Schneiders about current client needs (client is currently a patient at [facility] . Talked with [name] about RNCM support and LCSW support . Talked with [name] about Medicaid application process for [employer] . Talked with [name] about financial needs of client  Talked with [name] about DME that client already has at his home (has 3 in 1 BSC, lift chair, bed that elevates head and feet, walker, cane, wheelchair, nebulizer, oxygen concentrator)  Talked with [place] about physical therapy participation of client  Patient Self Care Activities:  . {CCM SELF CARE ACTIVITIES  . Enjoys speaking via phone with his wife  . {CCM SELF CARE DEFICITS:       Needs help with daily ADLs       Needs help with medication administration  Initial Care Plan documentation      Materials Provided: No    Follow Up Plan:  LCSW to call client/spouse in next 4 weeks to talk with client/spouse about client management of depression symptoms faced.   The patient Matthew Campos, spouse, verbalized understanding of instructions provided today and declined a print copy of patient instruction materials.   [name].Heinrich Fertig MSW, LCSW Licensed Clinical Social Worker [facility] Care  Management [phone]

## 2019-05-16 DIAGNOSIS — I4891 Unspecified atrial fibrillation: Secondary | ICD-10-CM | POA: Diagnosis not present

## 2019-05-16 DIAGNOSIS — I498 Other specified cardiac arrhythmias: Secondary | ICD-10-CM | POA: Diagnosis not present

## 2019-05-17 ENCOUNTER — Other Ambulatory Visit: Payer: Self-pay | Admitting: Family Medicine

## 2019-05-17 DIAGNOSIS — I5023 Acute on chronic systolic (congestive) heart failure: Secondary | ICD-10-CM | POA: Diagnosis not present

## 2019-05-17 DIAGNOSIS — I110 Hypertensive heart disease with heart failure: Secondary | ICD-10-CM | POA: Diagnosis not present

## 2019-05-17 DIAGNOSIS — D500 Iron deficiency anemia secondary to blood loss (chronic): Secondary | ICD-10-CM | POA: Diagnosis not present

## 2019-05-17 DIAGNOSIS — I2510 Atherosclerotic heart disease of native coronary artery without angina pectoris: Secondary | ICD-10-CM | POA: Diagnosis not present

## 2019-05-17 DIAGNOSIS — N183 Chronic kidney disease, stage 3 unspecified: Secondary | ICD-10-CM | POA: Diagnosis not present

## 2019-05-17 DIAGNOSIS — E1122 Type 2 diabetes mellitus with diabetic chronic kidney disease: Secondary | ICD-10-CM | POA: Diagnosis not present

## 2019-05-17 DIAGNOSIS — K921 Melena: Secondary | ICD-10-CM | POA: Diagnosis not present

## 2019-05-17 DIAGNOSIS — R262 Difficulty in walking, not elsewhere classified: Secondary | ICD-10-CM | POA: Diagnosis not present

## 2019-05-17 DIAGNOSIS — K811 Chronic cholecystitis: Secondary | ICD-10-CM | POA: Diagnosis not present

## 2019-05-17 DIAGNOSIS — N179 Acute kidney failure, unspecified: Secondary | ICD-10-CM | POA: Diagnosis not present

## 2019-05-19 DIAGNOSIS — R262 Difficulty in walking, not elsewhere classified: Secondary | ICD-10-CM | POA: Diagnosis not present

## 2019-05-19 DIAGNOSIS — I2510 Atherosclerotic heart disease of native coronary artery without angina pectoris: Secondary | ICD-10-CM | POA: Diagnosis not present

## 2019-05-19 DIAGNOSIS — K921 Melena: Secondary | ICD-10-CM | POA: Diagnosis not present

## 2019-05-19 DIAGNOSIS — I110 Hypertensive heart disease with heart failure: Secondary | ICD-10-CM | POA: Diagnosis not present

## 2019-05-19 DIAGNOSIS — E1122 Type 2 diabetes mellitus with diabetic chronic kidney disease: Secondary | ICD-10-CM | POA: Diagnosis not present

## 2019-05-19 DIAGNOSIS — K811 Chronic cholecystitis: Secondary | ICD-10-CM | POA: Diagnosis not present

## 2019-05-19 DIAGNOSIS — I5023 Acute on chronic systolic (congestive) heart failure: Secondary | ICD-10-CM | POA: Diagnosis not present

## 2019-05-19 DIAGNOSIS — D500 Iron deficiency anemia secondary to blood loss (chronic): Secondary | ICD-10-CM | POA: Diagnosis not present

## 2019-05-19 DIAGNOSIS — N183 Chronic kidney disease, stage 3 unspecified: Secondary | ICD-10-CM | POA: Diagnosis not present

## 2019-05-19 DIAGNOSIS — N179 Acute kidney failure, unspecified: Secondary | ICD-10-CM | POA: Diagnosis not present

## 2019-05-20 DIAGNOSIS — K921 Melena: Secondary | ICD-10-CM | POA: Diagnosis not present

## 2019-05-20 DIAGNOSIS — N183 Chronic kidney disease, stage 3 unspecified: Secondary | ICD-10-CM | POA: Diagnosis not present

## 2019-05-20 DIAGNOSIS — D500 Iron deficiency anemia secondary to blood loss (chronic): Secondary | ICD-10-CM | POA: Diagnosis not present

## 2019-05-20 DIAGNOSIS — I110 Hypertensive heart disease with heart failure: Secondary | ICD-10-CM | POA: Diagnosis not present

## 2019-05-20 DIAGNOSIS — I2510 Atherosclerotic heart disease of native coronary artery without angina pectoris: Secondary | ICD-10-CM | POA: Diagnosis not present

## 2019-05-20 DIAGNOSIS — I5023 Acute on chronic systolic (congestive) heart failure: Secondary | ICD-10-CM | POA: Diagnosis not present

## 2019-05-20 DIAGNOSIS — R262 Difficulty in walking, not elsewhere classified: Secondary | ICD-10-CM | POA: Diagnosis not present

## 2019-05-20 DIAGNOSIS — N179 Acute kidney failure, unspecified: Secondary | ICD-10-CM | POA: Diagnosis not present

## 2019-05-20 DIAGNOSIS — E1122 Type 2 diabetes mellitus with diabetic chronic kidney disease: Secondary | ICD-10-CM | POA: Diagnosis not present

## 2019-05-20 DIAGNOSIS — K811 Chronic cholecystitis: Secondary | ICD-10-CM | POA: Diagnosis not present

## 2019-05-24 ENCOUNTER — Other Ambulatory Visit: Payer: Self-pay

## 2019-05-25 ENCOUNTER — Other Ambulatory Visit: Payer: Self-pay

## 2019-05-25 ENCOUNTER — Ambulatory Visit (INDEPENDENT_AMBULATORY_CARE_PROVIDER_SITE_OTHER): Payer: Medicare HMO | Admitting: Family Medicine

## 2019-05-25 VITALS — BP 135/60 | HR 59 | Temp 99.0°F | Ht 68.0 in | Wt 171.0 lb

## 2019-05-25 DIAGNOSIS — I2510 Atherosclerotic heart disease of native coronary artery without angina pectoris: Secondary | ICD-10-CM | POA: Diagnosis not present

## 2019-05-25 DIAGNOSIS — R7989 Other specified abnormal findings of blood chemistry: Secondary | ICD-10-CM

## 2019-05-25 DIAGNOSIS — N179 Acute kidney failure, unspecified: Secondary | ICD-10-CM | POA: Diagnosis not present

## 2019-05-25 DIAGNOSIS — E1122 Type 2 diabetes mellitus with diabetic chronic kidney disease: Secondary | ICD-10-CM | POA: Diagnosis not present

## 2019-05-25 DIAGNOSIS — K811 Chronic cholecystitis: Secondary | ICD-10-CM | POA: Diagnosis not present

## 2019-05-25 DIAGNOSIS — I110 Hypertensive heart disease with heart failure: Secondary | ICD-10-CM | POA: Diagnosis not present

## 2019-05-25 DIAGNOSIS — R5381 Other malaise: Secondary | ICD-10-CM | POA: Diagnosis not present

## 2019-05-25 DIAGNOSIS — Z9181 History of falling: Secondary | ICD-10-CM | POA: Diagnosis not present

## 2019-05-25 DIAGNOSIS — Z7409 Other reduced mobility: Secondary | ICD-10-CM | POA: Diagnosis not present

## 2019-05-25 DIAGNOSIS — K921 Melena: Secondary | ICD-10-CM | POA: Diagnosis not present

## 2019-05-25 DIAGNOSIS — Z09 Encounter for follow-up examination after completed treatment for conditions other than malignant neoplasm: Secondary | ICD-10-CM | POA: Diagnosis not present

## 2019-05-25 DIAGNOSIS — D500 Iron deficiency anemia secondary to blood loss (chronic): Secondary | ICD-10-CM

## 2019-05-25 DIAGNOSIS — I5023 Acute on chronic systolic (congestive) heart failure: Secondary | ICD-10-CM | POA: Diagnosis not present

## 2019-05-25 DIAGNOSIS — N183 Chronic kidney disease, stage 3 unspecified: Secondary | ICD-10-CM | POA: Diagnosis not present

## 2019-05-25 DIAGNOSIS — I509 Heart failure, unspecified: Secondary | ICD-10-CM | POA: Diagnosis not present

## 2019-05-25 DIAGNOSIS — R262 Difficulty in walking, not elsewhere classified: Secondary | ICD-10-CM | POA: Diagnosis not present

## 2019-05-25 MED ORDER — PANTOPRAZOLE SODIUM 40 MG PO TBEC: 40.0000 mg | DELAYED_RELEASE_TABLET | Freq: Every day | ORAL | 3 refills | Status: DC

## 2019-05-25 NOTE — Progress Notes (Addendum)
Subjective: CC: Hospital discharge follow up PCP: [name], DO Matthew Campos is a 79 y.o. male presenting to clinic today for:  1.  Hospital discharge follow-up Patient was admitted to [facility] medical on 04/29/2019 and discharged on 05/14/2019 for acute on chronic hypoxic respiratory failure in the setting of acute decompensated heart failure.  While his weight was within normal range upon arrival, he was certainly fluid overloaded.  They diuresed him about 18 L and he was discharged at about 174 pounds.  He was noted to be anemic and was transfused a total of 3 units, 2 these units being done [facility] and 1 unit being done at [facility].  He had a colonoscopy which showed several polyps, a total of 17 were removed with 3 smaller ones not removed.  He was taken off of his anticoagulation and discharged home on an increased dose of the Protonix daily.  He is to follow-up with gastroenterology on an outpatient basis and cardiology in 2 to 4 weeks.  His wife notes that this is scheduled.  He will also be following up with his outpatient surgeons for his drain.  At discharge his creatinine was 1.43.  All of his blood sugar medications except for the Januvia were discontinued.  Torsemide was switched back to Lasix.  His ARB was held at discharge due to soft blood pressures.  Colonoscopy was recommended to be repeated in 1 year.  He denies any hematochezia or melena.  He is working with home physical therapy.  His wife notes that they have not heard back about transportation assistance.  Today, patient notes that he has been breathing well without supplemental oxygen most times.  He has had no lower extremity edema since discharge.  He denies any dry mouth.  He continues to be somewhat weak and needs assistance with home physical therapy.  His wife notes that he tends to roll out of the bed often as well.  They have a lift bed currently but she would  like to see if he can get a hospital bed with rails to prevent further falls.  They have not taken the Synthroid because they wanted to wait for repeat blood work to make sure that the elevated TSH was not due to acute illness.  He does not report any hypothyroid symptoms.  ROS: Per HPI  Allergies  Allergen Reactions  . Codeine Nausea And Vomiting  . Latex Hives and Itching  . Lisinopril Cough   Past Medical History:  Diagnosis Date  . 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 Aortic stenosis   . CHF (congestive heart failure) ([place])   . Complication of anesthesia    hard to be put to sleep  . COPD (chronic obstructive pulmonary disease) ([place])   . Coronary artery disease   . Diabetes mellitus without complication ([place])    takes Januvia and Metformin daily  . GERD (gastroesophageal reflux disease)    takes omeprazole daily  . Headache(784.0)    sinus  . History of bronchitis    last time >32yr ago  . Hyperlipidemia    takes Lipitor daily  . Hypertension    takes Metoprolol daily  . Joint pain   . [place]    x 3;last one about [id number]  . Pneumonia    last itme about [id number]    Current Outpatient Medications:  .  acetaZOLAMIDE (DIAMOX) 250 MG  tablet, Take by mouth., Disp: , Rfl:  .  albuterol (PROVENTIL) (2.5 MG/3ML) 0.083% nebulizer solution, Take 2.5 mg by nebulization every 6 (six) hours as needed for wheezing or shortness of breath., Disp: , Rfl:  .  albuterol (VENTOLIN HFA) 108 (90 Base) MCG/ACT inhaler, Inhale 2 puffs into the lungs every 6 (six) hours as needed for wheezing or shortness of breath., Disp: 18 g, Rfl: 3 .  aspirin 81 MG chewable tablet, Chew 81 mg by mouth daily., Disp: , Rfl:  .  Blood Glucose Monitoring Suppl (ONETOUCH VERIO) w/Device KIT, TID, Disp: , Rfl:  .  budesonide-formoterol (SYMBICORT) 80-4.5 MCG/ACT inhaler, Inhale 2 puffs into the lungs 2 (two) times daily., Disp: 3 Inhaler, Rfl: 1 .   Calcium Carbonate-Simethicone 750-80 MG CHEW, Chew 1 tablet by mouth as needed., Disp: , Rfl:  .  escitalopram (LEXAPRO) 10 MG tablet, TAKE ONE (1) TABLET EACH DAY, Disp: 90 tablet, Rfl: 0 .  furosemide (LASIX) 80 MG tablet, Take by mouth., Disp: , Rfl:  .  glucose blood (ONETOUCH VERIO) test strip, TID Dx E11.9, Disp: , Rfl:  .  Multiple Vitamin (MULTIVITAMIN WITH MINERALS) TABS, Take 1 tablet by mouth daily., Disp: , Rfl:  .  nitroGLYCERIN (NITROSTAT) 0.4 MG SL tablet, Place 0.4 mg under the tongue every 5 (five) minutes as needed. For chest pain, Disp: , Rfl:  .  ondansetron (ZOFRAN-ODT) 4 MG disintegrating tablet, TAKE 1 TABLET EVERY 8 HOURS AS NEEDED FOR NAUSEA AND VOMITING, Disp: 20 tablet, Rfl: 0 .  pantoprazole (PROTONIX) 20 MG tablet, Take 20 mg by mouth 2 (two) times daily., Disp: , Rfl:  .  rosuvastatin (CRESTOR) 20 MG tablet, Take 1 tablet (20 mg total) by mouth daily., Disp: 90 tablet, Rfl: 1 .  sitaGLIPtin (JANUVIA) 50 MG tablet, Take 1 tablet (50 mg total) by mouth 2 (two) times a day., Disp: 180 tablet, Rfl: 1 .  spironolactone (ALDACTONE) 25 MG tablet, Take by mouth., Disp: , Rfl:  .  levothyroxine (SYNTHROID) 150 MCG tablet, Take 150 mcg by mouth daily before breakfast., Disp: , Rfl:  .  ofloxacin (OCUFLOX) 0.3 % ophthalmic solution, every morning., Disp: , Rfl:  Social History   Socioeconomic History  . Marital status: Married    Spouse name: Not on file  . Number of children: Not on file  . Years of education: Not on file  . Highest education level: Not on file  Occupational History  . Not on file  Tobacco Use  . Smoking status: Former Research scientist (life sciences)  . Smokeless tobacco: Never Used  . Tobacco comment: quit smoking 20+yrs ago  Substance and Sexual Activity  . Alcohol use: No  . Drug use: No  . Sexual activity: Yes  Other Topics Concern  . Not on file  Social History Narrative  . Not on file   Social Determinants of Health   Financial Resource Strain: Low Risk   .  Difficulty of Paying Living Expenses: Not very hard  Food Insecurity: No Food Insecurity  . Worried About Charity fundraiser in the Last Year: Never true  . Ran Out of Food in the Last Year: Never true  Transportation Needs: Unmet Transportation Needs  . Lack of Transportation (Medical): No  . Lack of Transportation (Non-Medical): Yes  Physical Activity: Inactive  . Days of Exercise per Week: 0 days  . Minutes of Exercise per Session: 0 min  Stress: Stress Concern Present  . Feeling of Stress : To some extent  Social Connections: Somewhat Isolated  . Frequency of Communication with Friends and Family: Twice a week  . Frequency of Social Gatherings with Friends and Family: Twice a week  . Attends Religious Services: Never  . Active Member of Clubs or Organizations: No  . Attends Archivist Meetings: Never  . Marital Status: Married  Human resources officer Violence: Not At Risk  . Fear of Current or Ex-Partner: No  . Emotionally Abused: No  . Physically Abused: No  . Sexually Abused: No   Family History  Problem Relation Age of Onset  . Cancer Mother        Unknown type  . Heart disease Father   . Diabetes Father   . Heart disease Sister   . Seizures Brother   . Diabetes Daughter   . Diabetes Daughter   . Heart attack Son     Objective: Office vital signs reviewed. BP 135/60   Pulse (!) 59   Temp 99 F (37.2 C) (Temporal)   Ht '5\' 8"'$  (1.727 m)   Wt 171 lb (77.6 kg)   SpO2 98%   BMI 26.00 kg/m   Physical Examination:  General: Awake, alert, chronically ill appearing, No acute distress HEENT: Normal, MMM, no JVD Cardio: regular rate and rhythm, S1S2 heard  Pulm: clear to auscultation bilaterally, no wheezes, rhonchi or rales; normal work of breathing on room air (does come with O2 but has nasal cannula off) Extremities: warm, well perfused, No edema, cyanosis or clubbing; +2 pulses bilaterally MSK: slow gait and hunched station; requires assistance for  ambulation Skin: bruising of UEs noted  Assessment/ Plan: 79 y.o. male   1. Hospital discharge follow-up I reviewed his hospital course and discharge recommendations.  Repeat labs as below.  Given normal blood pressure, I would not restart the ARB at this time.  2. Congestive heart failure, unspecified HF chronicity, unspecified heart failure type (HCC) No evidence of fluid overload today.  He does not appear to be dry from a mucous membranes standpoint but his weight is certainly less than what he was discharged from the hospital. - Basic Metabolic Panel - For home use only DME Hospital bed  3. Blood loss anemia Check CBC.  I have written a prescription for Protonix as his wife said that this was not ready for pickup - CBC - For home use only DME Hospital bed - pantoprazole (PROTONIX) 40 MG tablet; Take 1 tablet (40 mg total) by mouth daily.  Dispense: 90 tablet; Refill: 3  4. Abnormal TSH Check thyroid panel.  Low threshold to start thyroid medication if TSH is still elevated.  Would plan for 1-2 mcg/kg/day - Thyroid Panel With TSH  5. At risk for falls DME for hospital bed sent to [place].  He frequently rolls and needs the bed rails so he does not fall out of bed. - For home use only DME Hospital bed  6. Physical deconditioning Continue home physical therapy.  Primarily mediated by his congestive heart failure.  Patient requires semielectric hospital bed with mattress and rails.  Elevation per patient's comfort. - For home use only DME Hospital bed  7. Impaired mobility and endurance - For home use only DME Hospital bed  I would like to see him back in about 6 weeks for recheck given frequent hospitalizations. No orders of the defined types were placed in this encounter.  No orders of the defined types were placed in this encounter.    [name], DO [facility] [phone]

## 2019-05-25 NOTE — Patient Instructions (Signed)
Ok to stay off Losartan until you see the heart doctor.  Keep an eye on blood pressures.  Goal <140/90.  You had labs performed today.  You will be contacted with the results of the labs once they are available, usually in the next 3 business days for routine lab work.  If you have an active my chart account, they will be released to your MyChart.  If you prefer to have these labs released to you via telephone, please let us know.  If you had a pap smear or biopsy performed, expect to be contacted in about 7-10 days.

## 2019-05-26 ENCOUNTER — Ambulatory Visit: Payer: Medicare HMO | Admitting: *Deleted

## 2019-05-26 ENCOUNTER — Other Ambulatory Visit: Payer: Self-pay | Admitting: Family Medicine

## 2019-05-26 DIAGNOSIS — R7989 Other specified abnormal findings of blood chemistry: Secondary | ICD-10-CM

## 2019-05-26 DIAGNOSIS — J449 Chronic obstructive pulmonary disease, unspecified: Secondary | ICD-10-CM

## 2019-05-26 DIAGNOSIS — D500 Iron deficiency anemia secondary to blood loss (chronic): Secondary | ICD-10-CM | POA: Diagnosis not present

## 2019-05-26 DIAGNOSIS — I110 Hypertensive heart disease with heart failure: Secondary | ICD-10-CM | POA: Diagnosis not present

## 2019-05-26 DIAGNOSIS — I2510 Atherosclerotic heart disease of native coronary artery without angina pectoris: Secondary | ICD-10-CM | POA: Diagnosis not present

## 2019-05-26 DIAGNOSIS — N183 Chronic kidney disease, stage 3 unspecified: Secondary | ICD-10-CM | POA: Diagnosis not present

## 2019-05-26 DIAGNOSIS — I5023 Acute on chronic systolic (congestive) heart failure: Secondary | ICD-10-CM | POA: Diagnosis not present

## 2019-05-26 DIAGNOSIS — R262 Difficulty in walking, not elsewhere classified: Secondary | ICD-10-CM | POA: Diagnosis not present

## 2019-05-26 DIAGNOSIS — K921 Melena: Secondary | ICD-10-CM | POA: Diagnosis not present

## 2019-05-26 DIAGNOSIS — N179 Acute kidney failure, unspecified: Secondary | ICD-10-CM | POA: Diagnosis not present

## 2019-05-26 DIAGNOSIS — I509 Heart failure, unspecified: Secondary | ICD-10-CM

## 2019-05-26 DIAGNOSIS — E1122 Type 2 diabetes mellitus with diabetic chronic kidney disease: Secondary | ICD-10-CM | POA: Diagnosis not present

## 2019-05-26 DIAGNOSIS — K811 Chronic cholecystitis: Secondary | ICD-10-CM | POA: Diagnosis not present

## 2019-05-26 LAB — BASIC METABOLIC PANEL
BUN/Creatinine Ratio: 21 (ref 10–24)
BUN: 35 mg/dL — ABNORMAL HIGH (ref 8–27)
CO2: 22 mmol/L (ref 20–29)
Calcium: 9.8 mg/dL (ref 8.6–10.2)
Chloride: 92 mmol/L — ABNORMAL LOW (ref 96–106)
Creatinine, Ser: 1.7 mg/dL — ABNORMAL HIGH (ref 0.76–1.27)
GFR calc Af Amer: 44 mL/min/{1.73_m2} — ABNORMAL LOW (ref >59)
GFR calc non Af Amer: 38 mL/min/{1.73_m2} — ABNORMAL LOW (ref >59)
Glucose: 133 mg/dL — ABNORMAL HIGH (ref 65–99)
Potassium: 4.2 mmol/L (ref 3.5–5.2)
Sodium: 133 mmol/L — ABNORMAL LOW (ref 134–144)

## 2019-05-26 LAB — CBC
Hematocrit: 32.5 % — ABNORMAL LOW (ref 37.5–51.0)
Hemoglobin: 9.9 g/dL — ABNORMAL LOW (ref 13.0–17.7)
MCH: 27.5 pg (ref 26.6–33.0)
MCHC: 30.5 g/dL — ABNORMAL LOW (ref 31.5–35.7)
MCV: 90 fL (ref 79–97)
Platelets: 228 10*3/uL (ref 150–450)
RBC: 3.6 x10E6/uL — ABNORMAL LOW (ref 4.14–5.80)
RDW: 17.7 % — ABNORMAL HIGH (ref 11.6–15.4)
WBC: 8.9 10*3/uL (ref 3.4–10.8)

## 2019-05-26 LAB — THYROID PANEL WITH TSH
Free Thyroxine Index: 0.8 — ABNORMAL LOW (ref 1.2–4.9)
T3 Uptake Ratio: 23 % — ABNORMAL LOW (ref 24–39)
T4, Total: 3.3 ug/dL — ABNORMAL LOW (ref 4.5–12.0)
TSH: 58.5 u[IU]/mL — ABNORMAL HIGH (ref 0.450–4.500)

## 2019-05-26 MED ORDER — LEVOTHYROXINE SODIUM 100 MCG PO TABS: 100.0000 ug | ORAL_TABLET | Freq: Every day | ORAL | 0 refills | Status: DC

## 2019-05-26 NOTE — Patient Instructions (Signed)
Visit Information  Goals Addressed            This Visit's Progress     Patient Stated   . Assistance with Transportation (pt-stated)       Transportation assistance needs in patient with CHF, COPD, and DM.  Current Barriers:  [name] Knowledge Deficits related to transportation assistance  Nurse Case Manager Clinical Goal(s):  [name] Over the next 7 days, patient/wife will work with CCM team regarding transportation assistance   Interventions:  . Outreach from PCP regarding ongoing transportation needs . Chart reviewed . Reached out to Care Guide for assistance with transportation needs. Prior referral reviewed and new notes documented for Care Guide's information.  . RN will discuss further with Care Guide after she has more information . RN will f/u with patient/family after more information has been provided on transportation services  Patient Self Care Activities:  . Self administers medications as prescribed . Calls provider office for new concerns or questions . Wife provides most care. Patient is wheelchair bound . Unable to independently walk   Please see past updates related to this goal by clicking on the "Past Updates" button in the selected goal        Chong Sicilian, BSN, RN-BC [place] [facility] [place] [facility] Direct Dial: [phone]

## 2019-05-26 NOTE — Chronic Care Management (AMB) (Signed)
  Chronic Care Management   Care Coordination Note   05/26/2019 Name: Matthew Campos MRN: [id number] DOB: May 09, 1940  Referred by: [name], DO Reason for referral : Chronic Care Management (care coordination)   Matthew Campos is a 79 y.o. year old male who is a primary care patient of [name], DO. The CCM team was consulted for assistance with chronic disease management and care coordination needs.    Review of patient status, including review of consultants reports, relevant laboratory and other test results, and collaboration with appropriate care team members and the patient's provider was performed as part of comprehensive patient evaluation and provision of chronic care management services.    RN Care Plan   . Assistance with Transportation       Transportation assistance needs in patient with CHF, COPD, and DM.  Current Barriers:  [name] Knowledge Deficits related to transportation assistance  Nurse Case Manager Clinical Goal(s):  [name] Over the next 7 days, patient/wife will work with CCM team regarding transportation assistance   Interventions:  . Outreach from PCP regarding ongoing transportation needs . Chart reviewed . Reached out to Care Guide for assistance with transportation needs. Prior referral reviewed and new notes documented for Care Guide's information.  . RN will discuss further with Care Guide after she has more information . RN will f/u with patient/family after more information has been provided on transportation services  Patient Self Care Activities:  . Self administers medications as prescribed . Calls provider office for new concerns or questions . Wife provides most care. Patient is wheelchair bound . Unable to independently walk   Please see past updates related to this goal by clicking on the "Past Updates" button in the selected goal          Plan:   The care management team will reach out to the patient again over the  next 15 days.    Chong Sicilian, BSN, RN-BC Embedded Chronic Care Manager [facility] [place] [facility] Direct Dial: [phone]

## 2019-05-27 ENCOUNTER — Telehealth: Payer: Self-pay

## 2019-05-27 ENCOUNTER — Telehealth: Payer: Self-pay | Admitting: Family Medicine

## 2019-05-27 DIAGNOSIS — I110 Hypertensive heart disease with heart failure: Secondary | ICD-10-CM | POA: Diagnosis not present

## 2019-05-27 DIAGNOSIS — K921 Melena: Secondary | ICD-10-CM | POA: Diagnosis not present

## 2019-05-27 DIAGNOSIS — E1122 Type 2 diabetes mellitus with diabetic chronic kidney disease: Secondary | ICD-10-CM | POA: Diagnosis not present

## 2019-05-27 DIAGNOSIS — N179 Acute kidney failure, unspecified: Secondary | ICD-10-CM | POA: Diagnosis not present

## 2019-05-27 DIAGNOSIS — N183 Chronic kidney disease, stage 3 unspecified: Secondary | ICD-10-CM | POA: Diagnosis not present

## 2019-05-27 DIAGNOSIS — I2510 Atherosclerotic heart disease of native coronary artery without angina pectoris: Secondary | ICD-10-CM | POA: Diagnosis not present

## 2019-05-27 DIAGNOSIS — K811 Chronic cholecystitis: Secondary | ICD-10-CM | POA: Diagnosis not present

## 2019-05-27 DIAGNOSIS — I5023 Acute on chronic systolic (congestive) heart failure: Secondary | ICD-10-CM | POA: Diagnosis not present

## 2019-05-27 DIAGNOSIS — R262 Difficulty in walking, not elsewhere classified: Secondary | ICD-10-CM | POA: Diagnosis not present

## 2019-05-27 DIAGNOSIS — D500 Iron deficiency anemia secondary to blood loss (chronic): Secondary | ICD-10-CM | POA: Diagnosis not present

## 2019-05-27 NOTE — Telephone Encounter (Signed)
05/27/2019 Spoke with patient's wife to obtain permission to create [facility] referral.  Referral placed to [facility] for transportation and assistance with paying utilities. Will check daily on referral status and update patient's wife as I receive information.  Ambrose Mantle [phone]

## 2019-05-27 NOTE — Telephone Encounter (Signed)
Aware.  Form was faxed to [place].

## 2019-05-31 DIAGNOSIS — R531 Weakness: Secondary | ICD-10-CM | POA: Diagnosis not present

## 2019-05-31 DIAGNOSIS — Z7409 Other reduced mobility: Secondary | ICD-10-CM | POA: Diagnosis not present

## 2019-06-01 DIAGNOSIS — E1122 Type 2 diabetes mellitus with diabetic chronic kidney disease: Secondary | ICD-10-CM | POA: Diagnosis not present

## 2019-06-01 DIAGNOSIS — K811 Chronic cholecystitis: Secondary | ICD-10-CM | POA: Diagnosis not present

## 2019-06-01 DIAGNOSIS — I110 Hypertensive heart disease with heart failure: Secondary | ICD-10-CM | POA: Diagnosis not present

## 2019-06-01 DIAGNOSIS — K921 Melena: Secondary | ICD-10-CM | POA: Diagnosis not present

## 2019-06-01 DIAGNOSIS — I5023 Acute on chronic systolic (congestive) heart failure: Secondary | ICD-10-CM | POA: Diagnosis not present

## 2019-06-01 DIAGNOSIS — I2510 Atherosclerotic heart disease of native coronary artery without angina pectoris: Secondary | ICD-10-CM | POA: Diagnosis not present

## 2019-06-01 DIAGNOSIS — D500 Iron deficiency anemia secondary to blood loss (chronic): Secondary | ICD-10-CM | POA: Diagnosis not present

## 2019-06-01 DIAGNOSIS — N183 Chronic kidney disease, stage 3 unspecified: Secondary | ICD-10-CM | POA: Diagnosis not present

## 2019-06-01 DIAGNOSIS — N179 Acute kidney failure, unspecified: Secondary | ICD-10-CM | POA: Diagnosis not present

## 2019-06-01 DIAGNOSIS — R262 Difficulty in walking, not elsewhere classified: Secondary | ICD-10-CM | POA: Diagnosis not present

## 2019-06-02 ENCOUNTER — Telehealth: Payer: Self-pay

## 2019-06-02 DIAGNOSIS — N179 Acute kidney failure, unspecified: Secondary | ICD-10-CM | POA: Diagnosis not present

## 2019-06-02 DIAGNOSIS — N183 Chronic kidney disease, stage 3 unspecified: Secondary | ICD-10-CM | POA: Diagnosis not present

## 2019-06-02 DIAGNOSIS — K811 Chronic cholecystitis: Secondary | ICD-10-CM | POA: Diagnosis not present

## 2019-06-02 DIAGNOSIS — K921 Melena: Secondary | ICD-10-CM | POA: Diagnosis not present

## 2019-06-02 DIAGNOSIS — I2510 Atherosclerotic heart disease of native coronary artery without angina pectoris: Secondary | ICD-10-CM | POA: Diagnosis not present

## 2019-06-02 DIAGNOSIS — R262 Difficulty in walking, not elsewhere classified: Secondary | ICD-10-CM | POA: Diagnosis not present

## 2019-06-02 DIAGNOSIS — I110 Hypertensive heart disease with heart failure: Secondary | ICD-10-CM | POA: Diagnosis not present

## 2019-06-02 DIAGNOSIS — E1122 Type 2 diabetes mellitus with diabetic chronic kidney disease: Secondary | ICD-10-CM | POA: Diagnosis not present

## 2019-06-02 DIAGNOSIS — D500 Iron deficiency anemia secondary to blood loss (chronic): Secondary | ICD-10-CM | POA: Diagnosis not present

## 2019-06-02 DIAGNOSIS — I5023 Acute on chronic systolic (congestive) heart failure: Secondary | ICD-10-CM | POA: Diagnosis not present

## 2019-06-02 NOTE — Telephone Encounter (Signed)
06/02/2019 Left message for patient's wife [place] to return my call regarding the status of the [facility] referrals placed. Ambrose Mantle [phone]

## 2019-06-04 ENCOUNTER — Ambulatory Visit: Payer: Medicare HMO

## 2019-06-04 DIAGNOSIS — I5023 Acute on chronic systolic (congestive) heart failure: Secondary | ICD-10-CM | POA: Diagnosis not present

## 2019-06-04 DIAGNOSIS — J449 Chronic obstructive pulmonary disease, unspecified: Secondary | ICD-10-CM | POA: Diagnosis not present

## 2019-06-04 DIAGNOSIS — E1122 Type 2 diabetes mellitus with diabetic chronic kidney disease: Secondary | ICD-10-CM | POA: Diagnosis not present

## 2019-06-04 DIAGNOSIS — N183 Chronic kidney disease, stage 3 unspecified: Secondary | ICD-10-CM | POA: Diagnosis not present

## 2019-06-04 DIAGNOSIS — R262 Difficulty in walking, not elsewhere classified: Secondary | ICD-10-CM | POA: Diagnosis not present

## 2019-06-04 DIAGNOSIS — D500 Iron deficiency anemia secondary to blood loss (chronic): Secondary | ICD-10-CM | POA: Diagnosis not present

## 2019-06-04 DIAGNOSIS — N179 Acute kidney failure, unspecified: Secondary | ICD-10-CM | POA: Diagnosis not present

## 2019-06-04 DIAGNOSIS — I2510 Atherosclerotic heart disease of native coronary artery without angina pectoris: Secondary | ICD-10-CM | POA: Diagnosis not present

## 2019-06-04 DIAGNOSIS — I110 Hypertensive heart disease with heart failure: Secondary | ICD-10-CM | POA: Diagnosis not present

## 2019-06-04 DIAGNOSIS — K921 Melena: Secondary | ICD-10-CM | POA: Diagnosis not present

## 2019-06-04 DIAGNOSIS — K811 Chronic cholecystitis: Secondary | ICD-10-CM | POA: Diagnosis not present

## 2019-06-04 NOTE — Chronic Care Management (AMB) (Signed)
  Chronic Care Management   Outreach Note  06/04/2019 Name: Matthew Campos MRN: [id number] DOB: 07-25-1940  Referred by: [name], DO Reason for referral : Chronic Care Management (RN follow up)   An unsuccessful telephone follow-up was attempted today. The patient was referred to the case management team for assistance with care management and care coordination. Care guide, Ambrose Mantle, has reached out to patient/wife regarding transportation assistance.   Follow Up Plan: The care management team will reach out to the patient again over the next 7 days.   Chong Sicilian, BSN, RN-BC Embedded Chronic Care Manager [facility] / [place] [facility] Direct Dial: [phone]

## 2019-06-07 ENCOUNTER — Other Ambulatory Visit: Payer: Self-pay | Admitting: Family Medicine

## 2019-06-10 ENCOUNTER — Telehealth: Payer: Self-pay

## 2019-06-10 ENCOUNTER — Ambulatory Visit (INDEPENDENT_AMBULATORY_CARE_PROVIDER_SITE_OTHER): Payer: Medicare HMO | Admitting: Licensed Clinical Social Worker

## 2019-06-10 DIAGNOSIS — I2510 Atherosclerotic heart disease of native coronary artery without angina pectoris: Secondary | ICD-10-CM

## 2019-06-10 DIAGNOSIS — F322 Major depressive disorder, single episode, severe without psychotic features: Secondary | ICD-10-CM

## 2019-06-10 DIAGNOSIS — J449 Chronic obstructive pulmonary disease, unspecified: Secondary | ICD-10-CM

## 2019-06-10 DIAGNOSIS — I10 Essential (primary) hypertension: Secondary | ICD-10-CM

## 2019-06-10 DIAGNOSIS — E119 Type 2 diabetes mellitus without complications: Secondary | ICD-10-CM | POA: Diagnosis not present

## 2019-06-10 DIAGNOSIS — K219 Gastro-esophageal reflux disease without esophagitis: Secondary | ICD-10-CM

## 2019-06-10 DIAGNOSIS — E782 Mixed hyperlipidemia: Secondary | ICD-10-CM | POA: Diagnosis not present

## 2019-06-10 DIAGNOSIS — I509 Heart failure, unspecified: Secondary | ICD-10-CM

## 2019-06-10 DIAGNOSIS — R69 Illness, unspecified: Secondary | ICD-10-CM | POA: Diagnosis not present

## 2019-06-10 NOTE — Chronic Care Management (AMB) (Signed)
Care Management Note   Matthew Campos is a 79 y.o. year old male who is a primary care patient of [name], DO. The CM team was consulted for assistance with chronic disease management and care coordination.   I reached out to [name] by phone today.     Review of patient status, including review of consultants reports, relevant laboratory and other test results, and collaboration with appropriate care team members and the patient's provider was performed as part of comprehensive patient evaluation and provision of chronic care management services.   Social determinants of health: risk of social isolation; risk of tobacco use; risk of stress; risk of physical inactivity; risk of transport needs    Office Visit from 05/25/2019 in [place]  PHQ-9 Total Score  0     GAD 7 : Generalized Anxiety Score 05/13/2019 10/28/2018  Nervous, Anxious, on Edge 1 3  Control/stop worrying 1 3  Worry too much - different things 1 3  Trouble relaxing 1 0  Restless 1 3  Easily annoyed or irritable 0 3  Afraid - awful might happen 0 0  Total GAD 7 Score 5 15  Anxiety Difficulty Somewhat difficult Not difficult at all   Medications   sitaGLIPtin (JANUVIA) 50 MG tablet acetaZOLAMIDE (DIAMOX) 250 MG tablet albuterol (PROVENTIL) (2.5 MG/3ML) 0.083% nebulizer solution albuterol (VENTOLIN HFA) 108 (90 Base) MCG/ACT inhaler aspirin 81 MG chewable tablet Blood Glucose Monitoring Suppl (ONETOUCH VERIO) w/Device KIT budesonide-formoterol (SYMBICORT) 80-4.5 MCG/ACT inhaler Calcium Carbonate-Simethicone 750-80 MG CHEW escitalopram (LEXAPRO) 10 MG tablet furosemide (LASIX) 80 MG tablet glucose blood (ONETOUCH VERIO) test strip levothyroxine (SYNTHROID) 100 MCG tablet Multiple Vitamin (MULTIVITAMIN WITH MINERALS) TABS nitroGLYCERIN (NITROSTAT) 0.4 MG SL tablet ofloxacin (OCUFLOX) 0.3 % ophthalmic solution ondansetron (ZOFRAN-ODT) 4 MG disintegrating  tablet pantoprazole (PROTONIX) 40 MG tablet rosuvastatin (CRESTOR) 20 MG tablet spironolactone (ALDACTONE) 25 MG tablet  Goals        . Client will talk with LCSW in next 30 days to discuss depression symptoms of client (pt-stated)     Current Barriers:  . Breathing challenges of client . Transportation challenges in client with Chronic Diagnoses of COPD, CAD, GERD, HLD, HTN, MDD, DM . Mobility challenges  Clinical Social Work Clinical Goal(s):  [name] LCSW will talk with client in next 4 weeks to discuss client management of depression symptoms faced  Interventions:  Talked with Matthew Campos, spouse, about transport needs of client  Talked with Matthew Campos about RNCM support and LCSW support  Talked with Matthew Campos about Medicaid application process for Matthew Campos  Talked with Matthew Campos about financial needs of client  Talked with Matthew Campos about DME that client already has at his home (has 3 in 1 BSC, lift chair, bed that elevates head and feet, walker, cane, wheelchair, nebulizer, oxygen concentrator)  Talked with Matthew Campos about physical therapy participation of client (she said physical therapy sessions had now been completed for client)  Talked with Matthew Campos about her sending client's unmet medical expenses for past 24 months to DSS as part of client's Medicaid application  Patient Self Care Activities:  . Enjoys speaking via phone with his wife   SELF CARE DEFICITS:       Needs help with daily ADLs       Needs help with medication administration  Initial Care Plan documentation      Follow Up Plan:  LCSW to call client/spouse in next 4 weeks to talk with client/spouse about client management of depression  symptoms faced.  [name].Charles Niese MSW, LCSW Licensed Clinical Social Worker [facility] [phone]

## 2019-06-10 NOTE — Telephone Encounter (Signed)
06/10/2019 Left message for patient's wife to return my call regarding the status of [id number] referral placed for transportation.  Ambrose Mantle [phone]

## 2019-06-10 NOTE — Patient Instructions (Addendum)
Licensed Clinical Social Worker Visit Information  Goals we discussed today:  Goals        . Client will talk with LCSW in next 30 days to discuss depression symptoms of client (pt-stated)     Current Barriers:  . Breathing challenges of client . Transportation challenges in client with Chronic Diagnoses of COPD, CAD, GERD, HLD, HTN, MDD, DM . Mobility challenges   Clinical Social Work Clinical Goal(s):  [name] LCSW will talk with client in next 4 weeks to discuss client management of depression symptoms faced  Interventions:  Talked with Lubertha Sayres, spouse, about transport needs of client  Talked with [name] about RNCM support and LCSW support  Talked with [name] about Medicaid application process for Millage Pace  Talked with [place] about financial needs of client  Talked with [name] about DME that client already has at his home(has 3 in 1 BSC, lift chair, bed that elevates head and feet, walker, cane, wheelchair, nebulizer, oxygen concentrator)  Talked with [name] about physical therapy participation of client (she said physical therapy sessions had now been completed for client)  Talked with [name] about her sending client's unmet medical expenses for past 24 months to DSS as part of client's Medicaid application  Patient Self Care Activities:  . Enjoys speaking via phone with his wife  SELF CARE DEFICITS:       Needs help with daily ADLs       Needs help with medication administration  Initial Care Plan documentation       Materials Provided: No  Follow Up Plan:LCSW to call client/spouse in next 4 weeks to talk with client/spouse about client management of depression symptoms faced  The patient /Matthew Campos, spouse, verbalized understanding of instructions provided today and declined a print copy of patient instruction materials.   [name].Tiena Manansala MSW, LCSW Licensed Clinical Social Worker [facility]  Management [phone]

## 2019-06-11 DIAGNOSIS — Z4682 Encounter for fitting and adjustment of non-vascular catheter: Secondary | ICD-10-CM | POA: Diagnosis not present

## 2019-06-11 DIAGNOSIS — K819 Cholecystitis, unspecified: Secondary | ICD-10-CM | POA: Diagnosis not present

## 2019-06-11 DIAGNOSIS — Z4803 Encounter for change or removal of drains: Secondary | ICD-10-CM | POA: Diagnosis not present

## 2019-06-12 DIAGNOSIS — I2510 Atherosclerotic heart disease of native coronary artery without angina pectoris: Secondary | ICD-10-CM | POA: Diagnosis not present

## 2019-06-12 DIAGNOSIS — K811 Chronic cholecystitis: Secondary | ICD-10-CM | POA: Diagnosis not present

## 2019-06-12 DIAGNOSIS — I110 Hypertensive heart disease with heart failure: Secondary | ICD-10-CM | POA: Diagnosis not present

## 2019-06-12 DIAGNOSIS — R262 Difficulty in walking, not elsewhere classified: Secondary | ICD-10-CM | POA: Diagnosis not present

## 2019-06-12 DIAGNOSIS — E1122 Type 2 diabetes mellitus with diabetic chronic kidney disease: Secondary | ICD-10-CM | POA: Diagnosis not present

## 2019-06-12 DIAGNOSIS — D500 Iron deficiency anemia secondary to blood loss (chronic): Secondary | ICD-10-CM | POA: Diagnosis not present

## 2019-06-12 DIAGNOSIS — N179 Acute kidney failure, unspecified: Secondary | ICD-10-CM | POA: Diagnosis not present

## 2019-06-12 DIAGNOSIS — I5023 Acute on chronic systolic (congestive) heart failure: Secondary | ICD-10-CM | POA: Diagnosis not present

## 2019-06-12 DIAGNOSIS — K921 Melena: Secondary | ICD-10-CM | POA: Diagnosis not present

## 2019-06-12 DIAGNOSIS — N183 Chronic kidney disease, stage 3 unspecified: Secondary | ICD-10-CM | POA: Diagnosis not present

## 2019-06-15 ENCOUNTER — Telehealth: Payer: Self-pay

## 2019-06-15 DIAGNOSIS — N179 Acute kidney failure, unspecified: Secondary | ICD-10-CM | POA: Diagnosis not present

## 2019-06-15 DIAGNOSIS — I5023 Acute on chronic systolic (congestive) heart failure: Secondary | ICD-10-CM | POA: Diagnosis not present

## 2019-06-15 DIAGNOSIS — I2510 Atherosclerotic heart disease of native coronary artery without angina pectoris: Secondary | ICD-10-CM | POA: Diagnosis not present

## 2019-06-15 DIAGNOSIS — D500 Iron deficiency anemia secondary to blood loss (chronic): Secondary | ICD-10-CM | POA: Diagnosis not present

## 2019-06-15 DIAGNOSIS — K811 Chronic cholecystitis: Secondary | ICD-10-CM | POA: Diagnosis not present

## 2019-06-15 DIAGNOSIS — I110 Hypertensive heart disease with heart failure: Secondary | ICD-10-CM | POA: Diagnosis not present

## 2019-06-15 DIAGNOSIS — K921 Melena: Secondary | ICD-10-CM | POA: Diagnosis not present

## 2019-06-15 DIAGNOSIS — E1122 Type 2 diabetes mellitus with diabetic chronic kidney disease: Secondary | ICD-10-CM | POA: Diagnosis not present

## 2019-06-15 DIAGNOSIS — N183 Chronic kidney disease, stage 3 unspecified: Secondary | ICD-10-CM | POA: Diagnosis not present

## 2019-06-15 DIAGNOSIS — R262 Difficulty in walking, not elsewhere classified: Secondary | ICD-10-CM | POA: Diagnosis not present

## 2019-06-15 NOTE — Telephone Encounter (Signed)
06/15/2019 Left message for patient's wife to return my call regarding the status of [id number] referral placed for transportation.  Ambrose Mantle [phone]

## 2019-06-15 NOTE — Telephone Encounter (Signed)
06/15/2019 Returning call. Left message on voicemail for patient's wife to return my call regarding transportation. Ambrose Mantle [phone]

## 2019-06-16 ENCOUNTER — Telehealth: Payer: Self-pay

## 2019-06-16 NOTE — Telephone Encounter (Signed)
06/16/2019 Spoke with patient about [place] [facility] a caregiver support program and [facility] a non-profit in-home aide charity. Ambrose Mantle [phone]

## 2019-06-18 DIAGNOSIS — K819 Cholecystitis, unspecified: Secondary | ICD-10-CM | POA: Diagnosis not present

## 2019-06-18 DIAGNOSIS — Z87891 Personal history of nicotine dependence: Secondary | ICD-10-CM | POA: Diagnosis not present

## 2019-06-18 DIAGNOSIS — K635 Polyp of colon: Secondary | ICD-10-CM | POA: Diagnosis not present

## 2019-06-18 DIAGNOSIS — D500 Iron deficiency anemia secondary to blood loss (chronic): Secondary | ICD-10-CM | POA: Diagnosis not present

## 2019-06-18 DIAGNOSIS — Z9689 Presence of other specified functional implants: Secondary | ICD-10-CM | POA: Diagnosis not present

## 2019-06-18 DIAGNOSIS — K2270 Barrett's esophagus without dysplasia: Secondary | ICD-10-CM | POA: Diagnosis not present

## 2019-06-18 DIAGNOSIS — J449 Chronic obstructive pulmonary disease, unspecified: Secondary | ICD-10-CM | POA: Diagnosis not present

## 2019-06-18 DIAGNOSIS — D509 Iron deficiency anemia, unspecified: Secondary | ICD-10-CM | POA: Diagnosis not present

## 2019-06-20 ENCOUNTER — Emergency Department (HOSPITAL_COMMUNITY)
Admission: EM | Admit: 2019-06-20 | Discharge: 2019-06-20 | Disposition: A | Discharge status: Home / Self Care | Payer: Medicare HMO | Attending: Emergency Medicine | Admitting: Emergency Medicine

## 2019-06-20 ENCOUNTER — Encounter (HOSPITAL_COMMUNITY): Payer: Self-pay

## 2019-06-20 ENCOUNTER — Emergency Department (HOSPITAL_COMMUNITY): Payer: Medicare HMO

## 2019-06-20 ENCOUNTER — Other Ambulatory Visit: Payer: Self-pay

## 2019-06-20 DIAGNOSIS — R58 Hemorrhage, not elsewhere classified: Secondary | ICD-10-CM | POA: Diagnosis not present

## 2019-06-20 DIAGNOSIS — Z7984 Long term (current) use of oral hypoglycemic drugs: Secondary | ICD-10-CM | POA: Diagnosis not present

## 2019-06-20 DIAGNOSIS — I2510 Atherosclerotic heart disease of native coronary artery without angina pectoris: Secondary | ICD-10-CM | POA: Diagnosis not present

## 2019-06-20 DIAGNOSIS — I509 Heart failure, unspecified: Secondary | ICD-10-CM | POA: Insufficient documentation

## 2019-06-20 DIAGNOSIS — S0990XA Unspecified injury of head, initial encounter: Secondary | ICD-10-CM | POA: Diagnosis not present

## 2019-06-20 DIAGNOSIS — I959 Hypotension, unspecified: Secondary | ICD-10-CM | POA: Diagnosis not present

## 2019-06-20 DIAGNOSIS — Z952 Presence of prosthetic heart valve: Secondary | ICD-10-CM | POA: Diagnosis not present

## 2019-06-20 DIAGNOSIS — Y9389 Activity, other specified: Secondary | ICD-10-CM | POA: Diagnosis not present

## 2019-06-20 DIAGNOSIS — E119 Type 2 diabetes mellitus without complications: Secondary | ICD-10-CM | POA: Insufficient documentation

## 2019-06-20 DIAGNOSIS — W19XXXA Unspecified fall, initial encounter: Secondary | ICD-10-CM | POA: Diagnosis not present

## 2019-06-20 DIAGNOSIS — I110 Hypertensive heart disease with heart failure: Secondary | ICD-10-CM | POA: Insufficient documentation

## 2019-06-20 DIAGNOSIS — Y999 Unspecified external cause status: Secondary | ICD-10-CM | POA: Insufficient documentation

## 2019-06-20 DIAGNOSIS — R0689 Other abnormalities of breathing: Secondary | ICD-10-CM | POA: Diagnosis not present

## 2019-06-20 DIAGNOSIS — S60511A Abrasion of right hand, initial encounter: Secondary | ICD-10-CM | POA: Insufficient documentation

## 2019-06-20 DIAGNOSIS — S0003XA Contusion of scalp, initial encounter: Secondary | ICD-10-CM | POA: Insufficient documentation

## 2019-06-20 DIAGNOSIS — S0001XA Abrasion of scalp, initial encounter: Secondary | ICD-10-CM | POA: Diagnosis not present

## 2019-06-20 DIAGNOSIS — S199XXA Unspecified injury of neck, initial encounter: Secondary | ICD-10-CM | POA: Diagnosis not present

## 2019-06-20 DIAGNOSIS — Y92512 Supermarket, store or market as the place of occurrence of the external cause: Secondary | ICD-10-CM | POA: Diagnosis not present

## 2019-06-20 DIAGNOSIS — I10 Essential (primary) hypertension: Secondary | ICD-10-CM | POA: Diagnosis not present

## 2019-06-20 DIAGNOSIS — R220 Localized swelling, mass and lump, head: Secondary | ICD-10-CM | POA: Diagnosis not present

## 2019-06-20 DIAGNOSIS — T148XXA Other injury of unspecified body region, initial encounter: Secondary | ICD-10-CM

## 2019-06-20 MED ORDER — BACITRACIN ZINC 500 UNIT/GM EX OINT
TOPICAL_OINTMENT | Freq: Once | CUTANEOUS | Status: AC
  Filled 2019-06-20: qty 0.9

## 2019-06-20 NOTE — ED Notes (Signed)
Pt transported to CT ?

## 2019-06-20 NOTE — ED Provider Notes (Signed)
Medical screening examination/treatment/procedure(s) were conducted as a shared visit with non-physician practitioner(s) and myself.  I personally evaluated the patient during the encounter.  EKG Interpretation  Date/Time:  Sunday June 20 2019 18:12:53 EST Ventricular Rate:  59 PR Interval:    QRS Duration: 97 QT Interval:  448 QTC Calculation: 444 R Axis:   13 Text Interpretation: Atrial fibrillation Confirmed by Fredia Sorrow [id number]) on 06/20/2019 6:29:53 PM   Results for orders placed or performed in visit on 05/25/19  Thyroid Panel With TSH  Result Value Ref Range   TSH 58.500 (H) 0.450 - 4.500 uIU/mL   T4, Total 3.3 (L) 4.5 - 12.0 ug/dL   T3 Uptake Ratio 23 (L) 24 - 39 %   Free Thyroxine Index 0.8 (L) 1.2 - 4.9  CBC  Result Value Ref Range   WBC 8.9 3.4 - 10.8 x10E3/uL   RBC 3.60 (L) 4.14 - 5.80 x10E6/uL   Hemoglobin 9.9 (L) 13.0 - 17.7 g/dL   Hematocrit 32.5 (L) 37.5 - 51.0 %   MCV 90 79 - 97 fL   MCH 27.5 26.6 - 33.0 pg   MCHC 30.5 (L) 31.5 - 35.7 g/dL   RDW 17.7 (H) 11.6 - 15.4 %   Platelets 228 150 - 450 [id number]  Basic Metabolic Panel  Result Value Ref Range   Glucose 133 (H) 65 - 99 mg/dL   BUN 35 (H) 8 - 27 mg/dL   Creatinine, Ser 1.70 (H) 0.76 - 1.27 mg/dL   GFR calc non Af Amer 38 (L) >59 mL/min/1.73   GFR calc Af Amer 44 (L) >59 mL/min/1.73   BUN/Creatinine Ratio 21 10 - 24   Sodium 133 (L) 134 - 144 mmol/L   Potassium 4.2 3.5 - 5.2 mmol/L   Chloride 92 (L) 96 - 106 mmol/L   CO2 22 20 - 29 mmol/L   Calcium 9.8 8.6 - 10.2 mg/dL   CT Head Wo Contrast  Result Date: 06/20/2019 CLINICAL DATA:  Fall, posterior head strike EXAM: CT HEAD WITHOUT CONTRAST CT CERVICAL SPINE WITHOUT CONTRAST TECHNIQUE: Multidetector CT imaging of the head and cervical spine was performed following the standard protocol without intravenous contrast. Multiplanar CT image reconstructions of the cervical spine were also generated. COMPARISON:  None. FINDINGS: CT HEAD FINDINGS Brain:  No evidence of acute infarction, hemorrhage, hydrocephalus, extra-axial collection or mass lesion/mass effect. Symmetric prominence of the ventricles, cisterns and sulci compatible with parenchymal volume loss. Patchy areas of white matter hypoattenuation are most compatible with chronic microvascular angiopathy. Vascular: Atherosclerotic calcification of the carotid siphons. No hyperdense vessel. Skull: Left parietal and occipital scalp swelling and thickening without subjacent calvarial fracture. No acute or suspicious osseous lesions. Sinuses/Orbits: Paranasal sinuses and mastoid air cells are predominantly clear. Included orbital structures are unremarkable. Other: None CT CERVICAL SPINE FINDINGS Alignment: None slight reversal the normal cervical lordosis. Mild retrolisthesis C3 on 4 is on a likely degenerative basis given the extent of spondylitic change at this level. No abnormally widened, jumped or perched facets. Craniocervical and atlantoaxial articulations are normally aligned accounting for mild leftward rotation of the patient head. Skull base and vertebrae: No acute fracture. No primary bone lesion or focal pathologic process. Soft tissues and spinal canal: No pre or paravertebral fluid or swelling. No visible canal hematoma. Disc levels: Multilevel intervertebral disc height loss with spondylitic endplate changes most pronounced at C3-4 but without significant canal stenosis. Additional uncinate hypertrophy and facet hypertrophic changes at this level result in mild-to-moderate bilateral foraminal narrowing. Upper  chest: No acute abnormality in the upper chest or imaged lung apices. Other: Normal thyroid. Cervical carotid atherosclerosis. IMPRESSION: 1. Left parietal and occipital scalp swelling and thickening without subjacent calvarial fracture. 2. No acute intracranial findings. Chronic microvascular angiopathy and parenchymal volume loss. 3. No evidence of acute fracture or traumatic malalignment  within the cervical spine. 4. Multilevel spondylitic and facet degenerative changes most pronounced at C3-4. 5. Cervical and intracranial atherosclerosis. Electronically Signed   By: Lovena Le M.D.   On: 06/20/2019 19:50   CT Cervical Spine Wo Contrast  Result Date: 06/20/2019 CLINICAL DATA:  Fall, posterior head strike EXAM: CT HEAD WITHOUT CONTRAST CT CERVICAL SPINE WITHOUT CONTRAST TECHNIQUE: Multidetector CT imaging of the head and cervical spine was performed following the standard protocol without intravenous contrast. Multiplanar CT image reconstructions of the cervical spine were also generated. COMPARISON:  None. FINDINGS: CT HEAD FINDINGS Brain: No evidence of acute infarction, hemorrhage, hydrocephalus, extra-axial collection or mass lesion/mass effect. Symmetric prominence of the ventricles, cisterns and sulci compatible with parenchymal volume loss. Patchy areas of white matter hypoattenuation are most compatible with chronic microvascular angiopathy. Vascular: Atherosclerotic calcification of the carotid siphons. No hyperdense vessel. Skull: Left parietal and occipital scalp swelling and thickening without subjacent calvarial fracture. No acute or suspicious osseous lesions. Sinuses/Orbits: Paranasal sinuses and mastoid air cells are predominantly clear. Included orbital structures are unremarkable. Other: None CT CERVICAL SPINE FINDINGS Alignment: None slight reversal the normal cervical lordosis. Mild retrolisthesis C3 on 4 is on a likely degenerative basis given the extent of spondylitic change at this level. No abnormally widened, jumped or perched facets. Craniocervical and atlantoaxial articulations are normally aligned accounting for mild leftward rotation of the patient head. Skull base and vertebrae: No acute fracture. No primary bone lesion or focal pathologic process. Soft tissues and spinal canal: No pre or paravertebral fluid or swelling. No visible canal hematoma. Disc levels:  Multilevel intervertebral disc height loss with spondylitic endplate changes most pronounced at C3-4 but without significant canal stenosis. Additional uncinate hypertrophy and facet hypertrophic changes at this level result in mild-to-moderate bilateral foraminal narrowing. Upper chest: No acute abnormality in the upper chest or imaged lung apices. Other: Normal thyroid. Cervical carotid atherosclerosis. IMPRESSION: 1. Left parietal and occipital scalp swelling and thickening without subjacent calvarial fracture. 2. No acute intracranial findings. Chronic microvascular angiopathy and parenchymal volume loss. 3. No evidence of acute fracture or traumatic malalignment within the cervical spine. 4. Multilevel spondylitic and facet degenerative changes most pronounced at C3-4. 5. Cervical and intracranial atherosclerosis. Electronically Signed   By: Lovena Le M.D.   On: 06/20/2019 19:50   Patient with a fall out of a wheelchair scooter type chart cart at [place].  Patient hit the back of his head has a soft tissue hematoma left occiput area.  CT head neck without any acute findings.  Also has skin tears to his right hand.  Patient without any other specific complaints.  Patient stable for discharge home.  Patient seen by me along with the physician assistant.   Fredia Sorrow, MD 06/20/19 1958
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
## 2019-06-20 NOTE — ED Notes (Signed)
Pt alert and oriented, sitting on side of bed for comfort. Pt states he is ready to go.

## 2019-06-20 NOTE — Discharge Instructions (Signed)
Your CT looked reassuring with no evidence of acute abnormalities.  He can apply ice to the hematoma on her head to help with pain and swelling.  Make sure you are keeping her wounds clean and dry.  You can apply bacitracin or Neosporin.  Return the emergency department for any difficulty walking, vomiting, difficulty moving arms or legs or any other worsening concerning symptoms.

## 2019-06-20 NOTE — ED Notes (Signed)
Skin tear on R hand cleaned, loose skin removed, Bacitracin applied and covered with non-stick dressing then wrapped in gauze.
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
## 2019-06-22 ENCOUNTER — Ambulatory Visit (INDEPENDENT_AMBULATORY_CARE_PROVIDER_SITE_OTHER): Payer: Medicare HMO

## 2019-06-22 ENCOUNTER — Other Ambulatory Visit: Payer: Self-pay

## 2019-06-22 DIAGNOSIS — Z7982 Long term (current) use of aspirin: Secondary | ICD-10-CM

## 2019-06-22 DIAGNOSIS — I480 Paroxysmal atrial fibrillation: Secondary | ICD-10-CM

## 2019-06-22 DIAGNOSIS — N183 Chronic kidney disease, stage 3 unspecified: Secondary | ICD-10-CM

## 2019-06-22 DIAGNOSIS — I130 Hypertensive heart and chronic kidney disease with heart failure and stage 1 through stage 4 chronic kidney disease, or unspecified chronic kidney disease: Secondary | ICD-10-CM

## 2019-06-22 DIAGNOSIS — K2970 Gastritis, unspecified, without bleeding: Secondary | ICD-10-CM | POA: Diagnosis not present

## 2019-06-22 DIAGNOSIS — Z7951 Long term (current) use of inhaled steroids: Secondary | ICD-10-CM

## 2019-06-22 DIAGNOSIS — K2270 Barrett's esophagus without dysplasia: Secondary | ICD-10-CM

## 2019-06-22 DIAGNOSIS — I2510 Atherosclerotic heart disease of native coronary artery without angina pectoris: Secondary | ICD-10-CM

## 2019-06-22 DIAGNOSIS — I714 Abdominal aortic aneurysm, without rupture: Secondary | ICD-10-CM

## 2019-06-22 DIAGNOSIS — E1122 Type 2 diabetes mellitus with diabetic chronic kidney disease: Secondary | ICD-10-CM

## 2019-06-22 DIAGNOSIS — K811 Chronic cholecystitis: Secondary | ICD-10-CM

## 2019-06-22 DIAGNOSIS — Z9981 Dependence on supplemental oxygen: Secondary | ICD-10-CM

## 2019-06-22 DIAGNOSIS — D61818 Other pancytopenia: Secondary | ICD-10-CM

## 2019-06-22 DIAGNOSIS — D414 Neoplasm of uncertain behavior of bladder: Secondary | ICD-10-CM

## 2019-06-22 DIAGNOSIS — Z7984 Long term (current) use of oral hypoglycemic drugs: Secondary | ICD-10-CM

## 2019-06-22 DIAGNOSIS — K635 Polyp of colon: Secondary | ICD-10-CM

## 2019-06-22 DIAGNOSIS — I5031 Acute diastolic (congestive) heart failure: Secondary | ICD-10-CM

## 2019-06-22 DIAGNOSIS — I5023 Acute on chronic systolic (congestive) heart failure: Secondary | ICD-10-CM

## 2019-06-22 DIAGNOSIS — Z9181 History of falling: Secondary | ICD-10-CM

## 2019-06-23 DIAGNOSIS — K2270 Barrett's esophagus without dysplasia: Secondary | ICD-10-CM | POA: Diagnosis not present

## 2019-06-23 DIAGNOSIS — E1122 Type 2 diabetes mellitus with diabetic chronic kidney disease: Secondary | ICD-10-CM | POA: Diagnosis not present

## 2019-06-23 DIAGNOSIS — I5023 Acute on chronic systolic (congestive) heart failure: Secondary | ICD-10-CM | POA: Diagnosis not present

## 2019-06-23 DIAGNOSIS — K811 Chronic cholecystitis: Secondary | ICD-10-CM | POA: Diagnosis not present

## 2019-06-23 DIAGNOSIS — I2510 Atherosclerotic heart disease of native coronary artery without angina pectoris: Secondary | ICD-10-CM | POA: Diagnosis not present

## 2019-06-23 DIAGNOSIS — N183 Chronic kidney disease, stage 3 unspecified: Secondary | ICD-10-CM | POA: Diagnosis not present

## 2019-06-23 DIAGNOSIS — I130 Hypertensive heart and chronic kidney disease with heart failure and stage 1 through stage 4 chronic kidney disease, or unspecified chronic kidney disease: Secondary | ICD-10-CM | POA: Diagnosis not present

## 2019-06-23 DIAGNOSIS — I480 Paroxysmal atrial fibrillation: Secondary | ICD-10-CM | POA: Diagnosis not present

## 2019-06-23 DIAGNOSIS — K2970 Gastritis, unspecified, without bleeding: Secondary | ICD-10-CM | POA: Diagnosis not present

## 2019-06-23 DIAGNOSIS — I5031 Acute diastolic (congestive) heart failure: Secondary | ICD-10-CM | POA: Diagnosis not present

## 2019-06-28 DIAGNOSIS — R531 Weakness: Secondary | ICD-10-CM | POA: Diagnosis not present

## 2019-06-28 DIAGNOSIS — K811 Chronic cholecystitis: Secondary | ICD-10-CM | POA: Diagnosis not present

## 2019-06-28 DIAGNOSIS — E1122 Type 2 diabetes mellitus with diabetic chronic kidney disease: Secondary | ICD-10-CM | POA: Diagnosis not present

## 2019-06-28 DIAGNOSIS — I2510 Atherosclerotic heart disease of native coronary artery without angina pectoris: Secondary | ICD-10-CM | POA: Diagnosis not present

## 2019-06-28 DIAGNOSIS — N183 Chronic kidney disease, stage 3 unspecified: Secondary | ICD-10-CM | POA: Diagnosis not present

## 2019-06-28 DIAGNOSIS — I130 Hypertensive heart and chronic kidney disease with heart failure and stage 1 through stage 4 chronic kidney disease, or unspecified chronic kidney disease: Secondary | ICD-10-CM | POA: Diagnosis not present

## 2019-06-28 DIAGNOSIS — I5031 Acute diastolic (congestive) heart failure: Secondary | ICD-10-CM | POA: Diagnosis not present

## 2019-06-28 DIAGNOSIS — I5023 Acute on chronic systolic (congestive) heart failure: Secondary | ICD-10-CM | POA: Diagnosis not present

## 2019-06-28 DIAGNOSIS — K2270 Barrett's esophagus without dysplasia: Secondary | ICD-10-CM | POA: Diagnosis not present

## 2019-06-28 DIAGNOSIS — Z7409 Other reduced mobility: Secondary | ICD-10-CM | POA: Diagnosis not present

## 2019-06-28 DIAGNOSIS — K2970 Gastritis, unspecified, without bleeding: Secondary | ICD-10-CM | POA: Diagnosis not present

## 2019-06-28 DIAGNOSIS — I480 Paroxysmal atrial fibrillation: Secondary | ICD-10-CM | POA: Diagnosis not present

## 2019-06-29 DIAGNOSIS — I4891 Unspecified atrial fibrillation: Secondary | ICD-10-CM | POA: Diagnosis not present

## 2019-06-29 DIAGNOSIS — I714 Abdominal aortic aneurysm, without rupture: Secondary | ICD-10-CM | POA: Diagnosis not present

## 2019-06-29 DIAGNOSIS — Z952 Presence of prosthetic heart valve: Secondary | ICD-10-CM | POA: Diagnosis not present

## 2019-07-02 DIAGNOSIS — J449 Chronic obstructive pulmonary disease, unspecified: Secondary | ICD-10-CM | POA: Diagnosis not present

## 2019-07-06 ENCOUNTER — Other Ambulatory Visit: Payer: Self-pay

## 2019-07-06 ENCOUNTER — Other Ambulatory Visit: Payer: Medicare HMO

## 2019-07-06 DIAGNOSIS — R7989 Other specified abnormal findings of blood chemistry: Secondary | ICD-10-CM

## 2019-07-06 DIAGNOSIS — I10 Essential (primary) hypertension: Secondary | ICD-10-CM

## 2019-07-06 DIAGNOSIS — I2510 Atherosclerotic heart disease of native coronary artery without angina pectoris: Secondary | ICD-10-CM

## 2019-07-06 DIAGNOSIS — I509 Heart failure, unspecified: Secondary | ICD-10-CM | POA: Diagnosis not present

## 2019-07-06 DIAGNOSIS — Z09 Encounter for follow-up examination after completed treatment for conditions other than malignant neoplasm: Secondary | ICD-10-CM

## 2019-07-07 DIAGNOSIS — I5031 Acute diastolic (congestive) heart failure: Secondary | ICD-10-CM | POA: Diagnosis not present

## 2019-07-07 DIAGNOSIS — E1122 Type 2 diabetes mellitus with diabetic chronic kidney disease: Secondary | ICD-10-CM | POA: Diagnosis not present

## 2019-07-07 DIAGNOSIS — I2510 Atherosclerotic heart disease of native coronary artery without angina pectoris: Secondary | ICD-10-CM | POA: Diagnosis not present

## 2019-07-07 DIAGNOSIS — N183 Chronic kidney disease, stage 3 unspecified: Secondary | ICD-10-CM | POA: Diagnosis not present

## 2019-07-07 DIAGNOSIS — K811 Chronic cholecystitis: Secondary | ICD-10-CM | POA: Diagnosis not present

## 2019-07-07 DIAGNOSIS — K2270 Barrett's esophagus without dysplasia: Secondary | ICD-10-CM | POA: Diagnosis not present

## 2019-07-07 DIAGNOSIS — I130 Hypertensive heart and chronic kidney disease with heart failure and stage 1 through stage 4 chronic kidney disease, or unspecified chronic kidney disease: Secondary | ICD-10-CM | POA: Diagnosis not present

## 2019-07-07 DIAGNOSIS — I5023 Acute on chronic systolic (congestive) heart failure: Secondary | ICD-10-CM | POA: Diagnosis not present

## 2019-07-07 DIAGNOSIS — I480 Paroxysmal atrial fibrillation: Secondary | ICD-10-CM | POA: Diagnosis not present

## 2019-07-07 DIAGNOSIS — K2970 Gastritis, unspecified, without bleeding: Secondary | ICD-10-CM | POA: Diagnosis not present

## 2019-07-07 LAB — CBC WITH DIFFERENTIAL/PLATELET
Basophils Absolute: 0.1 10*3/uL (ref 0.0–0.2)
Basos: 1 %
EOS (ABSOLUTE): 0.1 10*3/uL (ref 0.0–0.4)
Eos: 1 %
Hematocrit: 28 % — ABNORMAL LOW (ref 37.5–51.0)
Hemoglobin: 8.7 g/dL — CL (ref 13.0–17.7)
Immature Grans (Abs): 0.2 10*3/uL — ABNORMAL HIGH (ref 0.0–0.1)
Immature Granulocytes: 2 %
Lymphocytes Absolute: 1 10*3/uL (ref 0.7–3.1)
Lymphs: 11 %
MCH: 28.4 pg (ref 26.6–33.0)
MCHC: 31.1 g/dL — ABNORMAL LOW (ref 31.5–35.7)
MCV: 92 fL (ref 79–97)
Monocytes Absolute: 0.6 10*3/uL (ref 0.1–0.9)
Monocytes: 7 %
Neutrophils Absolute: 6.7 10*3/uL (ref 1.4–7.0)
Neutrophils: 78 %
Platelets: 248 10*3/uL (ref 150–450)
RBC: 3.06 x10E6/uL — ABNORMAL LOW (ref 4.14–5.80)
RDW: 18.4 % — ABNORMAL HIGH (ref 11.6–15.4)
WBC: 8.5 10*3/uL (ref 3.4–10.8)

## 2019-07-07 LAB — THYROID PANEL WITH TSH
Free Thyroxine Index: 1.6 (ref 1.2–4.9)
T3 Uptake Ratio: 29 % (ref 24–39)
T4, Total: 5.4 ug/dL (ref 4.5–12.0)
TSH: 4.04 u[IU]/mL (ref 0.450–4.500)

## 2019-07-08 ENCOUNTER — Ambulatory Visit (INDEPENDENT_AMBULATORY_CARE_PROVIDER_SITE_OTHER): Payer: Medicare HMO | Admitting: Licensed Clinical Social Worker

## 2019-07-08 ENCOUNTER — Other Ambulatory Visit: Payer: Self-pay | Admitting: Family Medicine

## 2019-07-08 ENCOUNTER — Ambulatory Visit: Payer: Medicare HMO | Admitting: *Deleted

## 2019-07-08 DIAGNOSIS — R69 Illness, unspecified: Secondary | ICD-10-CM | POA: Diagnosis not present

## 2019-07-08 DIAGNOSIS — K219 Gastro-esophageal reflux disease without esophagitis: Secondary | ICD-10-CM

## 2019-07-08 DIAGNOSIS — E119 Type 2 diabetes mellitus without complications: Secondary | ICD-10-CM | POA: Diagnosis not present

## 2019-07-08 DIAGNOSIS — J449 Chronic obstructive pulmonary disease, unspecified: Secondary | ICD-10-CM

## 2019-07-08 DIAGNOSIS — I10 Essential (primary) hypertension: Secondary | ICD-10-CM

## 2019-07-08 DIAGNOSIS — I509 Heart failure, unspecified: Secondary | ICD-10-CM

## 2019-07-08 DIAGNOSIS — I2510 Atherosclerotic heart disease of native coronary artery without angina pectoris: Secondary | ICD-10-CM

## 2019-07-08 DIAGNOSIS — F322 Major depressive disorder, single episode, severe without psychotic features: Secondary | ICD-10-CM

## 2019-07-08 DIAGNOSIS — R112 Nausea with vomiting, unspecified: Secondary | ICD-10-CM

## 2019-07-08 DIAGNOSIS — E782 Mixed hyperlipidemia: Secondary | ICD-10-CM

## 2019-07-08 DIAGNOSIS — D414 Neoplasm of uncertain behavior of bladder: Secondary | ICD-10-CM

## 2019-07-08 NOTE — Patient Instructions (Signed)
  RN Care Plan   . Need for Hospital Bed       CARE PLAN ENTRY (see longtitudinal plan of care for additional care plan information)  Need for hospital bed in patient with DM, CHF, COPD, and bladder cancer  Current Barriers:  [name] Knowledge Deficits related to process for obtaining hospital bed . Decreased mobility  Nurse Case Manager Clinical Goal(s):  [name] Over the next 15 days, patient will work with DME supplier to obtain hospital bed  Interventions:  . Consulted by Theadore Nan, LCSW who spoke with patient and his wife today o They explained that an order was sent from [facility] to [place] but that [name] doesn't supply hospital beds o Requesting that it be sent somewhere else, like [employer] . Communicated to [name], LPN at [facility] that patient would like order sent to [place]  Patient Self Care Activities:  . Has assistance from wife with ADLs  Initial goal documentation          Plan:  CCM team will follow-up on order for hospital bed over the next 7 days   Chong Sicilian, BSN, RN-BC [place] / [place] Management Direct Dial: [phone]

## 2019-07-08 NOTE — Telephone Encounter (Signed)
Last office visit 05/25/2019 Last refill 03/15/2019, #20, no refills

## 2019-07-08 NOTE — Patient Instructions (Addendum)
Licensed Clinical Social Worker Visit Information  Goals we discussed today:  Goals        . Client will talk with LCSW in next 30 days to discuss depression symptoms of client (pt-stated)     Current Barriers:  . Breathing challenges of client . Transportation challenges in client with Chronic Diagnoses of COPD, CAD, GERD, HLD, HTN, MDD, DM . Mobility challenges   Clinical Social Work Clinical Goal(s):  [name] LCSW will talk with client in next 4 weeks to discuss client management of depression symptoms faced  Interventions: Talked with Lubertha Sayres, spouse, about transport needs of client  Talked with [name] about RNCM support and LCSW support  Previously talked with [name] about Medicaid application process for [place]  Previously talked with [name] about financial needs of client  Talked with [name] about DME that client already has at his home (has 3 in 1 BSC, lift chair, bed that elevates head and feet, walker, cane, wheelchair, nebulizer, oxygen concentrator)  Talked with [name] about physical therapy participation of client (she said physical therapy sessions had now been completed for client)  Previously talked with [name] about her sending client's unmet medical expenses for past 24 months to DSS as part of client's Medicaid application  Talked with [name] about client's recent appointments  Talked with [name] about client's decreased energy  Talked with Apolonio Schneiders about sleeping challenges of client  Collaborated with RNCM regarding nursing needs of client  Patient Self Care Activities:  . {CCM SELF CARE ACTIVITIES  . Enjoys speaking via phone with his wife  . {CCM SELF CARE DEFICITS:       Needs help with daily ADLs       Needs help with medication administration  Initial Care Plan documentation      Materials Provided: No  Follow Up Plan:LCSW to call client/spouse in next 4 weeks to talk with client/spouse about client management of  depression symptoms faced.  The patient /Virginia Sestito, spouse,14verbalized understanding of instructions provided today and declined a print copy of patient instruction materials.   [name].Malayja Freund MSW, LCSW Licensed Clinical Social Worker [facility] Care Management [phone]

## 2019-07-08 NOTE — Chronic Care Management (AMB) (Signed)
  Chronic Care Management   Follow Up Note   07/08/2019 Name: Matthew Campos MRN: [id number] DOB: [birth year]  Referred by: [name], DO Reason for referral : Chronic Care Management (Care coordination)   Matthew Campos is a 79 y.o. year old male who is a primary care patient of [name], DO. The CCM team was consulted for assistance with chronic disease management and care coordination needs.    Review of patient status, including review of consultants reports, relevant laboratory and other test results, and collaboration with appropriate care team members and the patient's provider was performed as part of comprehensive patient evaluation and provision of chronic care management services.    RN Care Plan   . Need for Hospital Bed       CARE PLAN ENTRY (see longtitudinal plan of care for additional care plan information)  Need for hospital bed in patient with DM, CHF, COPD, and bladder cancer  Current Barriers:  [name] Knowledge Deficits related to process for obtaining hospital bed . Decreased mobility  Nurse Case Manager Clinical Goal(s):  [name] Over the next 15 days, patient will work with DME supplier to obtain hospital bed  Interventions:  . Consulted by Theadore Nan, LCSW who spoke with patient and his wife today o They explained that an order was sent from [facility] to [place] but that [name] doesn't supply hospital beds o Requesting that it be sent somewhere else, like [employer] . Communicated to [name], LPN at [facility] that patient would like order sent to [place]  Patient Self Care Activities:  . Has assistance from wife with ADLs  Initial goal documentation          Plan:  CCM team will follow-up on order for hospital bed over the next 7 days   Chong Sicilian, BSN, RN-BC [name] / [place] Management Direct Dial: [phone]

## 2019-07-08 NOTE — Chronic Care Management (AMB) (Signed)
Care Management Note   Matthew Campos is a 79 y.o. year old male who is a primary care patient of Matthew Norlander, DO. The CM team was consulted for assistance with chronic disease management and care coordination.   I reached out to Matthew Campos by phone today.   Review of patient status, including review of consultants reports, relevant laboratory and other test results, and collaboration with appropriate care team members and the patient's provider was performed as part of comprehensive patient evaluation and provision of chronic care management services.   Social determinants of health: risk of social isolation; risk of tobacco use; risk of stress; risk of physical inactivity; risk of transport needs    Office Visit from 05/25/2019 in [place]  PHQ-9 Total Score  0     GAD 7 : Generalized Anxiety Score 05/13/2019 10/28/2018  Nervous, Anxious, on Edge 1 3  Control/stop worrying 1 3  Worry too much - different things 1 3  Trouble relaxing 1 0  Restless 1 3  Easily annoyed or irritable 0 3  Afraid - awful might happen 0 0  Total GAD 7 Score 5 15  Anxiety Difficulty Somewhat difficult Not difficult at all   Medications   (very important)  New medications from outside sources are available for reconciliation  sitaGLIPtin (JANUVIA) 50 MG tablet acetaZOLAMIDE (DIAMOX) 250 MG tablet(Expired) albuterol (PROVENTIL) (2.5 MG/3ML) 0.083% nebulizer solution albuterol (VENTOLIN HFA) 108 (90 Base) MCG/ACT inhaler aspirin 81 MG chewable tablet Blood Glucose Monitoring Suppl (ONETOUCH VERIO) w/Device KIT budesonide-formoterol (SYMBICORT) 80-4.5 MCG/ACT inhaler Calcium Carbonate-Simethicone 750-80 MG CHEW escitalopram (LEXAPRO) 10 MG tablet furosemide (LASIX) 80 MG tablet glucose blood (ONETOUCH VERIO) test strip levothyroxine (SYNTHROID) 100 MCG tablet Multiple Vitamin (MULTIVITAMIN WITH MINERALS) TABS nitroGLYCERIN (NITROSTAT) 0.4 MG SL  tablet ofloxacin (OCUFLOX) 0.3 % ophthalmic solution ondansetron (ZOFRAN-ODT) 4 MG disintegrating tablet pantoprazole (PROTONIX) 40 MG tablet rosuvastatin (CRESTOR) 20 MG tablet spironolactone (ALDACTONE) 25 MG tablet  Goals        . Client will talk with LCSW in next 30 days to discuss depression symptoms of client (pt-stated)     Current Barriers:  . Breathing challenges of client . Transportation challenges in client with Chronic Diagnoses of COPD, CAD, GERD, HLD, HTN, MDD, DM . Mobility challenges   Clinical Social Work Clinical Goal(s):  [name] LCSW will talk with client in next 4 weeks to discuss client management of depression symptoms faced  Interventions:  Talked with Matthew Campos, spouse, about transport needs of client  Talked with Matthew Campos about RNCM support and LCSW support  Previously talked with Matthew Campos about Medicaid application process for [place]  Previously talked with Matthew Campos about financial needs of client  Talked with Matthew Campos about DME that client already has at his home(has 3 in 1 BSC, lift chair, bed that elevates head and feet, walker, cane, wheelchair, nebulizer, oxygen concentrator)  Talked with Matthew Campos about physical therapy participation of client (she said physical therapy sessions had now been completed for client)  Previously talked with Matthew Campos about her sending client's unmet medical expenses for past 24 months to DSS as part of client's Medicaid application  Talked with Matthew Campos about client's recent appointments  Talked with Matthew Campos about client's decreased energy  Talked with Matthew Campos about sleeping challenges of client  Collaborated with RNCM regarding nursing needs of client  Patient Self Care Activities:  . {CCM SELF CARE ACTIVITIES  . Enjoys speaking via phone with his wife  . {CCM SELF  CARE DEFICITS:       Needs help with daily ADLs       Needs help with medication administration  Initial Care Plan  documentation      Follow Up Plan:LCSW to call client/spouse in next 4 weeks to talk with client/spouse about client management of depression symptoms faced.  Matthew Campos.Matthew Campos MSW, LCSW Licensed Clinical Social Worker [facility] [phone]

## 2019-07-09 ENCOUNTER — Encounter: Payer: Self-pay | Admitting: *Deleted

## 2019-07-19 ENCOUNTER — Other Ambulatory Visit: Payer: Self-pay | Admitting: Family Medicine

## 2019-07-19 ENCOUNTER — Encounter (HOSPITAL_COMMUNITY): Payer: Self-pay

## 2019-07-19 DIAGNOSIS — R112 Nausea with vomiting, unspecified: Secondary | ICD-10-CM

## 2019-07-20 DIAGNOSIS — K819 Cholecystitis, unspecified: Secondary | ICD-10-CM | POA: Diagnosis not present

## 2019-07-23 ENCOUNTER — Encounter: Payer: Self-pay | Admitting: Family Medicine

## 2019-07-23 ENCOUNTER — Ambulatory Visit (INDEPENDENT_AMBULATORY_CARE_PROVIDER_SITE_OTHER): Payer: Medicare Other | Admitting: Family Medicine

## 2019-07-23 ENCOUNTER — Other Ambulatory Visit: Payer: Self-pay

## 2019-07-23 VITALS — BP 126/65 | HR 61 | Temp 98.6°F | Resp 22 | Ht 68.0 in | Wt 188.8 lb

## 2019-07-23 DIAGNOSIS — D500 Iron deficiency anemia secondary to blood loss (chronic): Secondary | ICD-10-CM | POA: Diagnosis not present

## 2019-07-23 DIAGNOSIS — F5101 Primary insomnia: Secondary | ICD-10-CM

## 2019-07-23 DIAGNOSIS — I509 Heart failure, unspecified: Secondary | ICD-10-CM

## 2019-07-23 DIAGNOSIS — K5909 Other constipation: Secondary | ICD-10-CM

## 2019-07-23 DIAGNOSIS — R7989 Other specified abnormal findings of blood chemistry: Secondary | ICD-10-CM

## 2019-07-23 LAB — HEMOGLOBIN, FINGERSTICK: Hemoglobin: 8.9 g/dL — ABNORMAL LOW (ref 12.6–17.7)

## 2019-07-23 MED ORDER — TRAZODONE HCL 50 MG PO TABS: 25.0000 mg | ORAL_TABLET | Freq: Every evening | ORAL | 3 refills | Status: DC | PRN

## 2019-07-23 MED ORDER — LUBIPROSTONE 24 MCG PO CAPS: 24.0000 ug | ORAL_CAPSULE | Freq: Two times a day (BID) | ORAL | 2 refills | Status: DC

## 2019-07-23 NOTE — Progress Notes (Signed)
Subjective: CC: Follow-up heart failure, anemia PCP: [name], DO DGL:OVFI Matthew Campos is a 79 y.o. male presenting to clinic today for:  1.  Heart failure Patient reports compliance with Lasix 80 mg twice daily.  In fact his wife notes that he is been using 1/3 pill at bedtime because he has been gaining a little bit of fluid.  This was mentioned to his cardiologist about 2 weeks ago but no other changes were made.  His wife notes that he actually responded better to torsemide that he does Lasix and she wonders if he can go back to that regimen.  His breathing has been baseline.  He has had some lower extremity edema.  2.  Anemia Patient reports having had his drain replaced about a week and a half ago because it come out again.  His hemoglobin was 8.7 at last check and they would like to get this rechecked today given his edema.  3.  Insomnia Patient reports ongoing insomnia.  He was prescribed trazodone in the hospital and wants to know if we can get this outpatient.  It did help with sleep.  He also has been a little bit more on edge lately.  His wife wonders if he should have his medications adjusted.  He takes Lexapro daily and as needed Ativan.  4.  Thyroid disorder Compliant with thyroid medication.  He has had some constipation that is chronic for him.  He takes MiraLAX but this often does not help and if it does it takes a few days before it happens.  No nausea or vomiting.  No blood in stool.   ROS: Per HPI  Allergies  Allergen Reactions  . Codeine Nausea And Vomiting  . Codeine   . Latex Hives and Itching  . Lisinopril   . Lisinopril Cough   Past Medical History:  Diagnosis Date  . AAA (abdominal aortic aneurysm) without rupture ([place])    repaired  . Abdominal aortic aneurysm without rupture ([place])   . Anemia   . Anxiety and depression   . Aortic stenosis   . 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[place])   . CHF (congestive heart failure) ([place])   . Cholecystitis   .  Chronic ischemic heart disease   . Complication of anesthesia    hard to be put to sleep  . COPD (chronic obstructive pulmonary disease) ([place])   . Coronary artery disease   . Diabetes mellitus without complication ([place])   . Diabetes mellitus without complication ([place])    takes Januvia and Metformin daily  . GERD (gastroesophageal reflux disease)   . GERD (gastroesophageal reflux disease)    takes omeprazole daily  . Headache(784.0)    sinus  . History of bronchitis    last time >17yr ago  . Hyperlipidemia    takes Lipitor daily  . Hypertension   . Hypertension    takes Metoprolol daily  . Joint pain   . Myocardial infarction ([place]    x 3;last one about 3-455yrago  . Neoplasm of uncertain behavior of bladder   . Obesity   . OSA (obstructive sleep apnea)   . Pancytopenia ([facility]  . Paroxysmal atrial fibrillation ([facility])   . Pneumonia    last itme about 9y69yrgo  . S/P TAVR (transcatheter aortic valve replacement)   . Severe aortic stenosis   . Sinus bradycardia     Current Outpatient Medications:  .  albuterol (PROVENTIL) (2.5 MG/3ML) 0.083% nebulizer solution, Take 2.5 mg by  nebulization every 6 (six) hours as needed for wheezing or shortness of breath., Disp: , Rfl:  .  albuterol (PROVENTIL) (2.5 MG/3ML) 0.083% nebulizer solution, Take 2.5 mg by nebulization every 6 (six) hours as needed for wheezing or shortness of breath., Disp: , Rfl:  .  albuterol (VENTOLIN HFA) 108 (90 Base) MCG/ACT inhaler, Inhale 2 puffs into the lungs every 6 (six) hours as needed for wheezing or shortness of breath., Disp: 18 g, Rfl: 3 .  albuterol (VENTOLIN HFA) 108 (90 Base) MCG/ACT inhaler, Inhale 2 puffs into the lungs every 6 (six) hours as needed. For shortness of breath., Disp: , Rfl:  .  aspirin 81 MG chewable tablet, Chew 81 mg by mouth daily., Disp: , Rfl:  .  aspirin EC 81 MG tablet, Take 81 mg by mouth daily., Disp: , Rfl:  .  Blood Glucose Monitoring Suppl (ONETOUCH VERIO) w/Device KIT,  TID, Disp: , Rfl:  .  budesonide-formoterol (SYMBICORT) 80-4.5 MCG/ACT inhaler, Inhale 2 puffs into the lungs 2 (two) times daily., Disp: 3 Inhaler, Rfl: 1 .  CALCIUM ANTACID 500 MG chewable tablet, Chew 1 tablet by mouth 3 (three) times daily as needed., Disp: , Rfl:  .  Calcium Carbonate-Simethicone 750-80 MG CHEW, Chew 1 tablet by mouth as needed., Disp: , Rfl:  .  escitalopram (LEXAPRO) 10 MG tablet, TAKE ONE (1) TABLET EACH DAY, Disp: 90 tablet, Rfl: 0 .  escitalopram (LEXAPRO) 10 MG tablet, Take 10 mg by mouth daily., Disp: , Rfl:  .  ferrous sulfate 325 (65 FE) MG EC tablet, Take by mouth., Disp: , Rfl:  .  furosemide (LASIX) 80 MG tablet, Take by mouth., Disp: , Rfl:  .  glucose blood (ONETOUCH VERIO) test strip, TID Dx E11.9, Disp: , Rfl:  .  levothyroxine (SYNTHROID) 100 MCG tablet, Take 1 tablet (100 mcg total) by mouth daily before breakfast., Disp: 90 tablet, Rfl: 0 .  LORazepam (ATIVAN) 0.5 MG tablet, Take 1 tablet by mouth at bedtime as needed., Disp: , Rfl:  .  Multiple Vitamin (MULTIVITAMIN WITH MINERALS) TABS, Take 1 tablet by mouth daily., Disp: , Rfl:  .  Multiple Vitamin (MULTIVITAMIN) capsule, Take 1 capsule by mouth daily., Disp: , Rfl:  .  nitroGLYCERIN (NITROSTAT) 0.4 MG SL tablet, Place 0.4 mg under the tongue every 5 (five) minutes as needed. For chest pain, Disp: , Rfl:  .  nitroGLYCERIN (NITROSTAT) 0.4 MG SL tablet, Place 0.4 mg under the tongue every 5 (five) minutes as needed for chest pain., Disp: , Rfl:  .  ofloxacin (OCUFLOX) 0.3 % ophthalmic solution, every morning., Disp: , Rfl:  .  ondansetron (ZOFRAN-ODT) 4 MG disintegrating tablet, TAKE 1 TABLET EVERY 8 HOURS AS NEEDED FOR NAUSEA AND VOMITING, Disp: 20 tablet, Rfl: 0 .  pantoprazole (PROTONIX) 40 MG tablet, Take 1 tablet (40 mg total) by mouth daily., Disp: 90 tablet, Rfl: 3 .  Propylene Glycol (SYSTANE BALANCE) 0.6 % SOLN, Apply 1 drop to eye daily as needed (1 GTT OU QD PRN FOR ALLERGIES.)., Disp: , Rfl:  .   rosuvastatin (CRESTOR) 10 MG tablet, Take 10 mg by mouth daily., Disp: , Rfl:  .  rosuvastatin (CRESTOR) 20 MG tablet, Take 1 tablet (20 mg total) by mouth daily., Disp: 90 tablet, Rfl: 1 .  sitaGLIPtin (JANUVIA) 25 MG tablet, Take 25 mg by mouth 2 (two) times daily. , Disp: , Rfl:  .  sitaGLIPtin (JANUVIA) 50 MG tablet, Take 1 tablet (50 mg total) by mouth daily. (renally  dosed) Please make sure patient aware of change, Disp: 90 tablet, Rfl: 0 .  spironolactone (ALDACTONE) 25 MG tablet, Take by mouth., Disp: , Rfl:  .  SYMBICORT 80-4.5 MCG/ACT inhaler, Inhale 2 puffs into the lungs 2 (two) times daily as needed., Disp: , Rfl:  .  torsemide (DEMADEX) 20 MG tablet, Take 1 tablet by mouth 3 (three) times daily. TAKE 2 TABS PO EACH AM, 1 TAB PO EACH PM., Disp: , Rfl:  .  acetaZOLAMIDE (DIAMOX) 250 MG tablet, Take by mouth., Disp: , Rfl:  .  levothyroxine (SYNTHROID) 150 MCG tablet, Take 1 tablet (150 mcg total) by mouth daily before breakfast., Disp: 30 tablet, Rfl: 3 .  pantoprazole (PROTONIX) 40 MG tablet, Take 1 tablet (40 mg total) by mouth 2 (two) times daily before a meal., Disp: 60 tablet, Rfl: 0 Social History   Socioeconomic History  . Marital status: Married    Spouse name: Not on file  . Number of children: Not on file  . Years of education: Not on file  . Highest education level: Not on file  Occupational History  . Not on file  Tobacco Use  . Smoking status: Former Research scientist (life sciences)  . Smokeless tobacco: Never Used  . Tobacco comment: quit smoking 20+yrs ago  Substance and Sexual Activity  . Alcohol use: No    Comment: last drank about 2-3 years ago  . Drug use: No  . Sexual activity: Yes  Other Topics Concern  . Not on file  Social History Narrative   ** Merged History Encounter **       Social Determinants of Health   Financial Resource Strain: Low Risk   . Difficulty of Paying Living Expenses: Not very hard  Food Insecurity: No Food Insecurity  . Worried About Ship broker in the Last Year: Never true  . Ran Out of Food in the Last Year: Never true  Transportation Needs: Unmet Transportation Needs  . Lack of Transportation (Medical): No  . Lack of Transportation (Non-Medical): Yes  Physical Activity: Inactive  . Days of Exercise per Week: 0 days  . Minutes of Exercise per Session: 0 min  Stress: Stress Concern Present  . Feeling of Stress : To some extent  Social Connections: Somewhat Isolated  . Frequency of Communication with Friends and Family: Twice a week  . Frequency of Social Gatherings with Friends and Family: Twice a week  . Attends Religious Services: Never  . Active Member of Clubs or Organizations: No  . Attends Archivist Meetings: Never  . Marital Status: Married  Human resources officer Violence: Not At Risk  . Fear of Current or Ex-Partner: No  . Emotionally Abused: No  . Physically Abused: No  . Sexually Abused: No   Family History  Problem Relation Age of Onset  . Cancer Mother        Unknown type  . Heart disease Father   . Diabetes Father   . Heart disease Sister   . Seizures Brother   . Diabetes Daughter   . Diabetes Daughter   . Heart attack Son   . Colon cancer Mother        in her 54s.   . Stomach cancer Neg Hx   . Esophageal cancer Neg Hx     Objective: Office vital signs reviewed. BP 126/65   Pulse 61   Temp 98.6 F (37 C) (Temporal)   Resp (!) 22   Ht 5' 8" (1.727 m)  Wt 188 lb 12.8 oz (85.6 kg)   SpO2 97% Comment: 2L O2  BMI 28.71 kg/m   Physical Examination:  General: Awake, alert, chronically ill-appearing.  No acute distress HEENT: Normal, sclera white Cardio: Irregularly irregular. S1S2 heard, no murmurs appreciated Pulm: Mild Rales noted at the bases bilaterally.; normal work of breathing on supplemental oxygen Extremities: warm, well perfused, +1 edema to mid shins bilaterally.  No cyanosis or clubbing; +2 pulses bilaterally GI: Protuberant.  He has his gallbladder tube in  place. MSK: Unsteady gait.  Utilizing walker for ambulation.  Assessment/ Plan: 79 y.o. male   1. Congestive heart failure, unspecified HF chronicity, unspecified heart failure type ([place]) Appears little fluid overloaded today. Recommend continuing the extra lasix.  I also recommended that they reach out to the cardiologist about this since he does not seem be responding as well to the Lasix as he was the torsemide.  Wonder if he can go back to the other one. - Basic Metabolic Panel  2. Blood loss anemia Last hemoglobin was 8.7.  Will recheck. - Hemoglobin, fingerstick  3. Abnormal TSH Last panel was normal.  Continue current regimen.  Follow-up in 2 months with repeat.  4. Chronic constipation Trial of Amitiza - lubiprostone (AMITIZA) 24 MCG capsule; Take 1 capsule (24 mcg total) by mouth 2 (two) times daily with a meal. For constipation  Dispense: 60 capsule; Refill: 2  5. Primary insomnia Trial of trazodone - traZODone (DESYREL) 50 MG tablet; Take 0.5-1 tablets (25-50 mg total) by mouth at bedtime as needed for sleep.  Dispense: 30 tablet; Refill: 3   Orders Placed This Encounter  Procedures  . Hemoglobin, fingerstick  . Basic Metabolic Panel   No orders of the defined types were placed in this encounter.    [name], DO [place] [phone]

## 2019-07-24 LAB — BASIC METABOLIC PANEL
BUN/Creatinine Ratio: 17 (ref 10–24)
BUN: 26 mg/dL (ref 8–27)
CO2: 28 mmol/L (ref 20–29)
Calcium: 9.3 mg/dL (ref 8.6–10.2)
Chloride: 93 mmol/L — ABNORMAL LOW (ref 96–106)
Creatinine, Ser: 1.55 mg/dL — ABNORMAL HIGH (ref 0.76–1.27)
GFR calc Af Amer: 49 mL/min/{1.73_m2} — ABNORMAL LOW (ref >59)
GFR calc non Af Amer: 42 mL/min/{1.73_m2} — ABNORMAL LOW (ref >59)
Glucose: 147 mg/dL — ABNORMAL HIGH (ref 65–99)
Potassium: 4.4 mmol/L (ref 3.5–5.2)
Sodium: 137 mmol/L (ref 134–144)

## 2019-07-26 ENCOUNTER — Other Ambulatory Visit: Payer: Self-pay | Admitting: Family Medicine

## 2019-07-26 ENCOUNTER — Ambulatory Visit (INDEPENDENT_AMBULATORY_CARE_PROVIDER_SITE_OTHER): Payer: Medicare Other

## 2019-07-26 VITALS — BP 158/88 | Wt 188.0 lb

## 2019-07-26 DIAGNOSIS — Z0000 Encounter for general adult medical examination without abnormal findings: Secondary | ICD-10-CM

## 2019-07-26 NOTE — Progress Notes (Signed)
MEDICARE ANNUAL WELLNESS VISIT  07/26/2019  Telephone Visit Disclaimer This Medicare AWV was conducted by telephone due to national recommendations for restrictions regarding the COVID-19 Pandemic (e.g. social distancing).  I verified, using two identifiers, that I am speaking with Matthew Campos or their authorized healthcare agent. I discussed the limitations, risks, security, and privacy concerns of performing an evaluation and management service by telephone and the potential availability of an in-person appointment in the future. The patient expressed understanding and agreed to proceed.   Subjective:  Matthew Campos is a 79 y.o. male patient of Matthew Norlander, DO who had a Medicare Annual Wellness Visit today via telephone. Matthew Campos is Retired and lives with their spouse. he reports that he is not socially active and does interact with friends/family regularly. he is not physically active and enjoys sitting outside and spending time with his pet dog and [place] pig.  Patient Care Team: Matthew Norlander, DO as PCP - General (Family Medicine) Matthew Rim, MD as Referring Physician (Internal Medicine) Matthew Norlander, DO (Family Medicine) Matthew Evans [name], LCSW as Social Worker (Licensed Clinical Social Worker)  Advanced Directives 07/26/2019 04/17/2019 04/17/2019 04/15/2019 04/13/2019 04/13/2019 01/29/2019  Does Patient Have a Medical Advance Directive? Yes No Yes Yes No No No  Type of Advance Directive Living will - - - - - -  Does patient want to make changes to medical advance directive? No - Guardian declined No - Patient declined No - Patient declined - - - -  Would patient like information on creating a medical advance directive? - No - Patient declined No - Patient declined - No - Patient declined - No - Patient declined  Pre-existing out of facility DNR order (yellow form or pink MOST form) - - - - - - -    Hospital Utilization Over the Past 12 Months: # of  hospitalizations or ER visits: 3 # of surgeries: 2  Review of Systems    Patient reports that his overall health is unchanged compared to last year.  Patient's wife also his caregiver states that patient has had watery brown stools for days. Wife will decrease Amitiza to qod for now on her own. Accompanied with abdominal pain. Denies bleeding. Believes abdominal pain is from swelling in the abdomen. This is putting pressure on his cholecystostomy tube. Per wife the site is not red/irritated. No fever. Pt is taking Lasix and Spironolactone.  Wife also concerned about patient continuing to want to drive. He has become lost at one point with 38 year old grandson in the vehicle. Suggested visiting the [facility] website to have licensed revoked due to safety concerns. She does not have Internet access so forms printed in the office and given to Matthew Campos for patient's visit in June. Advised wife to discuss this with patient prior to appointment. She understood.    Patient Reported Readings (BP- 158/88 weight-188 pounds, BS- 179 on 07/25/2019  Pain Assessment Pain : 0-10 Pain Score: 3  Pain Type: Acute pain Pain Location: Abdomen Pain Descriptors / Indicators: Discomfort Pain Onset: In the past 7 days Pain Frequency: Intermittent     Current Medications & Allergies (verified) Allergies as of 07/26/2019      Reactions   Codeine Nausea And Vomiting   Codeine    Latex Hives, Itching   Lisinopril    Lisinopril Cough      Medication List       Accurate as of July 26, 2019 11:05 AM.  If you have any questions, ask your nurse or doctor.        STOP taking these medications   Calcium Antacid 500 MG chewable tablet Generic drug: calcium carbonate   Calcium Carbonate-Simethicone 750-80 MG Chew   multivitamin capsule   ofloxacin 0.3 % ophthalmic solution Commonly known as: OCUFLOX   torsemide 20 MG tablet Commonly known as: DEMADEX     TAKE these medications     acetaZOLAMIDE 250 MG tablet Commonly known as: DIAMOX Take by mouth.   albuterol (2.5 MG/3ML) 0.083% nebulizer solution Commonly known as: PROVENTIL Take 2.5 mg by nebulization every 6 (six) hours as needed for wheezing or shortness of breath.   albuterol (2.5 MG/3ML) 0.083% nebulizer solution Commonly known as: PROVENTIL Take 2.5 mg by nebulization every 6 (six) hours as needed for wheezing or shortness of breath.   albuterol 108 (90 Base) MCG/ACT inhaler Commonly known as: VENTOLIN HFA Inhale 2 puffs into the lungs every 6 (six) hours as needed for wheezing or shortness of breath.   albuterol 108 (90 Base) MCG/ACT inhaler Commonly known as: VENTOLIN HFA Inhale 2 puffs into the lungs every 6 (six) hours as needed. For shortness of breath.   aspirin EC 81 MG tablet Take 81 mg by mouth daily. What changed: Another medication with the same name was removed. Continue taking this medication, and follow the directions you see here.   budesonide-formoterol 80-4.5 MCG/ACT inhaler Commonly known as: SYMBICORT Inhale 2 puffs into the lungs 2 (two) times daily.   Symbicort 80-4.5 MCG/ACT inhaler Generic drug: budesonide-formoterol Inhale 2 puffs into the lungs 2 (two) times daily as needed.   escitalopram 10 MG tablet Commonly known as: LEXAPRO TAKE ONE (1) TABLET EACH DAY   ferrous sulfate 325 (65 FE) MG EC tablet Take by mouth.   furosemide 80 MG tablet Commonly known as: LASIX Take by mouth.   levothyroxine 150 MCG tablet Commonly known as: SYNTHROID Take 1 tablet (150 mcg total) by mouth daily before breakfast. What changed: Another medication with the same name was removed. Continue taking this medication, and follow the directions you see here.   LORazepam 0.5 MG tablet Commonly known as: ATIVAN Take 1 tablet by mouth at bedtime as needed.   lubiprostone 24 MCG capsule Commonly known as: Amitiza Take 1 capsule (24 mcg total) by mouth 2 (two) times daily with a  meal. For constipation   multivitamin with minerals Tabs tablet Take 1 tablet by mouth daily.   nitroGLYCERIN 0.4 MG SL tablet Commonly known as: NITROSTAT Place 0.4 mg under the tongue every 5 (five) minutes as needed. For chest pain What changed: Another medication with the same name was removed. Continue taking this medication, and follow the directions you see here.   ondansetron 4 MG disintegrating tablet Commonly known as: ZOFRAN-ODT TAKE 1 TABLET EVERY 8 HOURS AS NEEDED FOR NAUSEA AND VOMITING   OneTouch Verio test strip Generic drug: glucose blood TID Dx E11.9   OneTouch Verio w/Device Kit TID   pantoprazole 40 MG tablet Commonly known as: PROTONIX Take 1 tablet (40 mg total) by mouth daily. What changed: Another medication with the same name was removed. Continue taking this medication, and follow the directions you see here.   rosuvastatin 10 MG tablet Commonly known as: CRESTOR Take 10 mg by mouth daily. What changed: Another medication with the same name was removed. Continue taking this medication, and follow the directions you see here.   sitaGLIPtin 50 MG tablet Commonly known as:  Januvia Take 1 tablet (50 mg total) by mouth daily. (renally dosed) Please make sure patient aware of change What changed: Another medication with the same name was removed. Continue taking this medication, and follow the directions you see here.   spironolactone 25 MG tablet Commonly known as: ALDACTONE Take by mouth.   Systane Balance 0.6 % Soln Generic drug: Propylene Glycol Apply 1 drop to eye daily as needed (1 GTT OU QD PRN FOR ALLERGIES.).   traZODone 50 MG tablet Commonly known as: DESYREL Take 0.5-1 tablets (25-50 mg total) by mouth at bedtime as needed for sleep.       History (reviewed): Past Medical History:  Diagnosis Date  . AAA (abdominal aortic aneurysm) without rupture ([place])    repaired  . Abdominal aortic aneurysm without rupture ([place])   . Anemia   .  Anxiety and depression   . Aortic stenosis   . 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[place])   . CHF (congestive heart failure) ([place])   . Cholecystitis   . Chronic ischemic heart disease   . Complication of anesthesia    hard to be put to sleep  . COPD (chronic obstructive pulmonary disease) ([place])   . Coronary artery disease   . Diabetes mellitus without complication ([place])   . Diabetes mellitus without complication ([place])    takes Januvia and Metformin daily  . GERD (gastroesophageal reflux disease)   . GERD (gastroesophageal reflux disease)    takes omeprazole daily  . Headache(784.0)    sinus  . History of bronchitis    last time >4yr ago  . Hyperlipidemia    takes Lipitor daily  . Hypertension   . Hypertension    takes Metoprolol daily  . Joint pain   . [place]ocardial infarction ([place] [place]    x 3;last one about 3-473yrago  . Neoplasm of uncertain behavior of bladder   . Obesity   . OSA (obstructive sleep apnea)   . Pancytopenia ([facility]  . Paroxysmal atrial fibrillation ([facility])   . Pneumonia    last itme about 9y4yrgo  . S/P TAVR (transcatheter aortic valve replacement)   . Severe aortic stenosis   . Sinus bradycardia    Past Surgical History:  Procedure Laterality Date  . abdominal aneurysm stenting    . ABDOMINAL AORTIC ANEURYSM REPAIR     per patient stents placed about 4-5 years ago  . AORTIC VALVE REPLACEMENT     stated it was done in November  . cholecystostomy tube    . CORONARY ANGIOPLASTY  2010  . CORONARY ANGIOPLASTY WITH STENT PLACEMENT     about 30 years ago per patient  . cyst removed from left wrist    . ESOPHAGOGASTRODUODENOSCOPY (EGD) WITH PROPOFOL N/A 04/15/2019   Procedure: ESOPHAGOGASTRODUODENOSCOPY (EGD) WITH PROPOFOL;  Surgeon: FieDanie BinderD;  [facility];  Service: Endoscopy;  Laterality: N/A;  possible dilation  . HERNIA REPAIR    . left shoulder surgery    . POLYPECTOMY  04/15/2019   Procedure: POLYPECTOMY;  Surgeon: FieDanie BinderD;   [facility];  Service: Endoscopy;;  duodenal   . SHOULDER ARTHROSCOPY WITH ROTATOR CUFF REPAIR AND SUBACROMIAL DECOMPRESSION  04/17/2012   Procedure: SHOULDER ARTHROSCOPY WITH ROTATOR CUFF REPAIR AND SUBACROMIAL DECOMPRESSION;  Surgeon: W DYvette RackMD;  [facility];  Laterality: Right;  RIGHT SHOULDER ROTATOR CUFF REPAIR INCLUDING ACROMIOPLASTY CHRONIC, ARTHROSCOPY SHOULDER DEBRIDEMENT EXTENSIVE  . TONSILLECTOMY    . VALVE  REPLACEMENT     Family History  Problem Relation Age of Onset  . Cancer Mother        Unknown type  . Heart disease Father   . Diabetes Father   . Heart disease Sister   . Seizures Brother   . Diabetes Daughter   . Diabetes Daughter   . Heart attack Son   . Colon cancer Mother        in her 63s.   . Stomach cancer Neg Hx   . Esophageal cancer Neg Hx    Social History   Socioeconomic History  . Marital status: Married    Spouse name: Not on file  . Number of children: Not on file  . Years of education: Not on file  . Highest education level: Not on file  Occupational History  . Not on file  Tobacco Use  . Smoking status: Former Research scientist (life sciences)  . Smokeless tobacco: Never Used  . Tobacco comment: quit smoking 20+yrs ago  Substance and Sexual Activity  . Alcohol use: No    Comment: last drank about 2-3 years ago  . Drug use: No  . Sexual activity: Yes  Other Topics Concern  . Not on file  Social History Narrative   ** Merged History Encounter **       Social Determinants of Health   Financial Resource Strain: Low Risk   . Difficulty of Paying Living Expenses: Not very hard  Food Insecurity: No Food Insecurity  . Worried About Charity fundraiser in the Last Year: Never true  . Ran Out of Food in the Last Year: Never true  Transportation Needs: Unmet Transportation Needs  . Lack of Transportation (Medical): No  . Lack of Transportation (Non-Medical): Yes  Physical Activity: Inactive  . Days of Exercise per  Week: 0 days  . Minutes of Exercise per Session: 0 min  Stress: Stress Concern Present  . Feeling of Stress : To some extent  Social Connections: Somewhat Isolated  . Frequency of Communication with Friends and Family: Twice a week  . Frequency of Social Gatherings with Friends and Family: Twice a week  . Attends Religious Services: Never  . Active Member of Clubs or Organizations: No  . Attends Archivist Meetings: Never  . Marital Status: Married    Activities of Daily Living In your present state of health, do you have any difficulty performing the following activities: 07/26/2019 04/13/2019  Hearing? Y N  Vision? Y N  Difficulty concentrating or making decisions? Y N  Walking or climbing stairs? Y N  Dressing or bathing? Y N  Doing errands, shopping? Tempie Donning  Preparing Food and eating ? Y -  Using the Toilet? Y -  In the past six months, have you accidently leaked urine? N -  Do you have problems with loss of bowel control? Y -  Managing your Medications? Y -  Managing your Finances? Y -  Housekeeping or managing your Housekeeping? Y -  Some recent data might be hidden    Patient Education/ Literacy How often do you need to have someone help you when you read instructions, pamphlets, or other written materials from your doctor or pharmacy?: 3 - Sometimes What is the last grade level you completed in school?: HS- not sure what level he completed  Exercise Current Exercise Habits: The patient does not participate in regular exercise at present, Exercise limited by: cardiac condition(s);neurologic condition(s);orthopedic condition(s)  Diet Patient reports consuming 2 meals a  day and 2 snack(s) a day Patient reports that his primary diet is: Low fat Patient reports that she does have regular access to food.   Depression Screen PHQ 2/9 Scores 07/26/2019 07/26/2019 07/23/2019 05/25/2019 05/13/2019 01/20/2019 10/28/2018  PHQ - 2 Score 1 0 0 0 2 0 1  PHQ- 9 Score - - - 0 6 0 -       Fall Risk Fall Risk  07/26/2019 07/23/2019 05/25/2019 01/20/2019  Falls in the past year? - 1 - 1  Number falls in past yr: 1 1 0 1  Injury with Fall? 1 0 0 -  Comment Skin tears/ abrasions - - -  Risk for fall due to : History of fall(s);Impaired balance/gait;Impaired mobility History of fall(s);Impaired balance/gait;Impaired mobility - -  Follow up Falls evaluation completed Falls evaluation completed - -     Objective:  Matthew Campos seemed alert and oriented and he participated appropriately during our telephone visit.  Blood Pressure Weight BMI  BP Readings from Last 3 Encounters:  07/26/19 (!) 158/88  07/23/19 126/65  06/20/19 102/60   Wt Readings from Last 3 Encounters:  07/26/19 188 lb (85.3 kg)  07/23/19 188 lb 12.8 oz (85.6 kg)  06/20/19 174 lb 2.6 oz (79 kg)   BMI Readings from Last 1 Encounters:  07/26/19 28.59 kg/m    *Unable to obtain current vital signs, weight, and BMI due to telephone visit type  Hearing/Vision  . Rhyker did  seem to have difficulty with hearing/understanding during the telephone conversation . Reports that he has had a formal eye exam by an eye care professional within the past year . Reports that he has not had a formal hearing evaluation within the past year *Unable to fully assess hearing and vision during telephone visit type  Cognitive Function: 6CIT Screen 07/26/2019  What Year? 4 points  What month? 3 points  What time? 3 points  Count back from 20 0 points  Months in reverse 4 points  Repeat phrase 8 points  Total Score 22   (Normal:0-7, Significant for Dysfunction: >8)  Normal Cognitive Function Screening: No: 22   Immunization & Health Maintenance Record Immunization History  Administered Date(s) Administered  . Influenza,inj,Quad PF,6+ Mos 01/01/2019  . Influenza-Unspecified 02/03/2014, 02/29/2016, 01/20/2017, 02/10/2018  . Pneumococcal Conjugate-13 11/25/2014, 11/25/2014  . Pneumococcal Polysaccharide-23  05/30/2016, 05/30/2016, 01/01/2019  . Tdap 11/25/2014, 11/25/2014    Health Maintenance  Topic Date Due  . FOOT EXAM  Never done  . URINE MICROALBUMIN  12/11/2018  . HEMOGLOBIN A1C  10/20/2019  . INFLUENZA VACCINE  11/14/2019  . OPHTHALMOLOGY EXAM  02/02/2020  . TETANUS/TDAP  11/24/2024  . PNA vac Low Risk Adult  Completed       Assessment  This is a routine wellness examination for Matthew Campos.  Health Maintenance: Due or Overdue Health Maintenance Due  Topic Date Due  . FOOT EXAM  Never done  . URINE MICROALBUMIN  12/11/2018    Matthew Campos does not need a referral for Community Assistance: Care Management:   no Social Work:    no Prescription Assistance:  no Nutrition/Diabetes Education:  no   Plan:  Personalized Goals  Fall prevention  Monitor Blood pressure, weight, swelling  Discuss not driving  Eating three balanced meals per day  Exercise as tolerated   Personalized Health Maintenance & Screening Recommendations   Lung Cancer Screening Recommended: no (Low Dose CT Chest recommended if Age 56-80 years, 30 pack-year currently smoking OR have  quit w/in past 15 years) Hepatitis C Screening recommended: no HIV Screening recommended: no  Advanced Directives: Written information was not prepared per patient's request.  Referrals & Orders No orders of the defined types were placed in this encounter.   Follow-up Plan . Follow-up with Matthew Norlander, DO as planned . Schedule 09/24/2019   I have personally reviewed and noted the following in the patient's chart:   . Medical and social history . Use of alcohol, tobacco or illicit drugs  . Current medications and supplements . Functional ability and status . Nutritional status . Physical activity . Advanced directives . List of other physicians . Hospitalizations, surgeries, and ER visits in previous 12 months . Vitals . Screenings to include cognitive, depression, and  falls . Referrals and appointments  In addition, I have reviewed and discussed with Matthew Campos certain preventive protocols, quality metrics, and best practice recommendations. A written personalized care plan for preventive services as well as general preventive health recommendations is available and can be mailed to the patient at his request.      Alphonzo Dublin  07/26/2019

## 2019-07-28 NOTE — Patient Instructions (Addendum)
  [place] Maintenance Summary and Written Plan of Care  [name] ,  Thank you for allowing me to perform your Medicare Annual Wellness Visit and for your ongoing commitment to your health.   Health Maintenance & Immunization History Health Maintenance  Topic Date Due  . FOOT EXAM  Never done  . URINE MICROALBUMIN  12/11/2018  . HEMOGLOBIN A1C  10/20/2019  . INFLUENZA VACCINE  11/14/2019  . OPHTHALMOLOGY EXAM  02/02/2020  . TETANUS/TDAP  11/24/2024  . PNA vac Low Risk Adult  Completed   Immunization History  Administered Date(s) Administered  . Influenza,inj,Quad PF,6+ Mos 01/01/2019  . Influenza-Unspecified 02/03/2014, 02/29/2016, 01/20/2017, 02/10/2018  . Pneumococcal Conjugate-13 11/25/2014, 11/25/2014  . Pneumococcal Polysaccharide-23 05/30/2016, 05/30/2016, 01/01/2019  . Tdap 11/25/2014, 11/25/2014    These are the patient goals that we discussed:   Fall prevention  Monitoring blood pressure, weight, swelling  Eat three balanced meals per day  Exercise as tolerated  Discuss no longer driving  This is a list of Health Maintenance Items that are overdue or due now: Health Maintenance Due  Topic Date Due  . FOOT EXAM  Never done  . URINE MICROALBUMIN  12/11/2018     Orders/Referrals Placed Today: No orders of the defined types were placed in this encounter.  (Contact our referral department at [phone] if you have not spoken with someone about your referral appointment within the next 5 days)    Follow-up Plan  With Dr. Lajuana Ripple on 09/24/2019

## 2019-07-29 DIAGNOSIS — I509 Heart failure, unspecified: Secondary | ICD-10-CM | POA: Diagnosis not present

## 2019-08-03 DIAGNOSIS — K810 Acute cholecystitis: Secondary | ICD-10-CM | POA: Diagnosis not present

## 2019-08-03 DIAGNOSIS — K819 Cholecystitis, unspecified: Secondary | ICD-10-CM | POA: Diagnosis not present

## 2019-08-06 ENCOUNTER — Ambulatory Visit (INDEPENDENT_AMBULATORY_CARE_PROVIDER_SITE_OTHER): Payer: Medicare Other | Admitting: Licensed Clinical Social Worker

## 2019-08-06 ENCOUNTER — Other Ambulatory Visit: Payer: Self-pay | Admitting: Family Medicine

## 2019-08-06 DIAGNOSIS — F322 Major depressive disorder, single episode, severe without psychotic features: Secondary | ICD-10-CM

## 2019-08-06 DIAGNOSIS — I10 Essential (primary) hypertension: Secondary | ICD-10-CM | POA: Diagnosis not present

## 2019-08-06 DIAGNOSIS — J449 Chronic obstructive pulmonary disease, unspecified: Secondary | ICD-10-CM

## 2019-08-06 DIAGNOSIS — K219 Gastro-esophageal reflux disease without esophagitis: Secondary | ICD-10-CM

## 2019-08-06 DIAGNOSIS — I2510 Atherosclerotic heart disease of native coronary artery without angina pectoris: Secondary | ICD-10-CM

## 2019-08-06 DIAGNOSIS — E119 Type 2 diabetes mellitus without complications: Secondary | ICD-10-CM

## 2019-08-06 DIAGNOSIS — E782 Mixed hyperlipidemia: Secondary | ICD-10-CM

## 2019-08-06 NOTE — Chronic Care Management (AMB) (Signed)
Chronic Care Management    Clinical Social Work Follow Up Note  08/06/2019 Name: Matthew Campos MRN: [id number] DOB: 1940-12-12  Matthew Campos is a 79 y.o. year old male who is a primary care patient of Matthew Norlander, DO. The CCM team was consulted for assistance with [employer] .   Review of patient status, including review of consultants reports, other relevant assessments, and collaboration with appropriate care team members and the patient's provider was performed as part of comprehensive patient evaluation and provision of chronic care management services.    SDOH (Social Determinants of Health) assessments performed: Yes; risk for social isolation; risk for tobacco use; risk for stress; risk for physical inactivity; risk for transport needs    Office Visit from 05/25/2019 in [facility]  PHQ-9 Total Score  0     GAD 7 : Generalized Anxiety Score 05/13/2019 10/28/2018  Nervous, Anxious, on Edge 1 3  Control/stop worrying 1 3  Worry too much - different things 1 3  Trouble relaxing 1 0  Restless 1 3  Easily annoyed or irritable 0 3  Afraid - awful might happen 0 0  Total GAD 7 Score 5 15  Anxiety Difficulty Somewhat difficult Not difficult at all    Outpatient Encounter Medications as of 08/06/2019  Medication Sig Note  . acetaZOLAMIDE (DIAMOX) 250 MG tablet Take by mouth.   [name] albuterol (PROVENTIL) (2.5 MG/3ML) 0.083% nebulizer solution Take 2.5 mg by nebulization every 6 (six) hours as needed for wheezing or shortness of breath.   [name] albuterol (PROVENTIL) (2.5 MG/3ML) 0.083% nebulizer solution Take 2.5 mg by nebulization every 6 (six) hours as needed for wheezing or shortness of breath.   [name] albuterol (VENTOLIN HFA) 108 (90 Base) MCG/ACT inhaler Inhale 2 puffs into the lungs every 6 (six) hours as needed for wheezing or shortness of breath.   [name] albuterol (VENTOLIN HFA) 108 (90 Base) MCG/ACT inhaler Inhale 2 puffs into the lungs every 6 (six)  hours as needed. For shortness of breath.   [name] aspirin EC 81 MG tablet Take 81 mg by mouth daily. 04/13/2019: TOOK 4 TABS 04/13/19 0300 BEFORE PARAMEDICS ARRIVED.  [name] Blood Glucose Monitoring Suppl (ONETOUCH VERIO) w/Device KIT TID   . budesonide-formoterol (SYMBICORT) 80-4.5 MCG/ACT inhaler Inhale 2 puffs into the lungs 2 (two) times daily.   [name] escitalopram (LEXAPRO) 10 MG tablet TAKE ONE (1) TABLET EACH DAY   . ferrous sulfate 325 (65 FE) MG EC tablet Take by mouth.   . furosemide (LASIX) 80 MG tablet Take by mouth.   [name] glucose blood (ONETOUCH VERIO) test strip TID Dx E11.9   . levothyroxine (SYNTHROID) 150 MCG tablet Take 1 tablet (150 mcg total) by mouth daily before breakfast.   . LORazepam (ATIVAN) 0.5 MG tablet Take 1 tablet by mouth at bedtime as needed.   . lubiprostone (AMITIZA) 24 MCG capsule Take 1 capsule (24 mcg total) by mouth 2 (two) times daily with a meal. For constipation   . Multiple Vitamin (MULTIVITAMIN WITH MINERALS) TABS Take 1 tablet by mouth daily.   . nitroGLYCERIN (NITROSTAT) 0.4 MG SL tablet Place 0.4 mg under the tongue every 5 (five) minutes as needed. For chest pain   . ondansetron (ZOFRAN-ODT) 4 MG disintegrating tablet TAKE 1 TABLET EVERY 8 HOURS AS NEEDED FOR NAUSEA AND VOMITING   . pantoprazole (PROTONIX) 40 MG tablet Take 1 tablet (40 mg total) by mouth daily.   [name] Propylene Glycol (SYSTANE BALANCE) 0.6 %  SOLN Apply 1 drop to eye daily as needed (1 GTT OU QD PRN FOR ALLERGIES.).   [name] rosuvastatin (CRESTOR) 10 MG tablet Take 10 mg by mouth daily.   . sitaGLIPtin (JANUVIA) 50 MG tablet Take 1 tablet (50 mg total) by mouth daily. (renally dosed) Please make sure patient aware of change   . spironolactone (ALDACTONE) 25 MG tablet Take by mouth.   . SYMBICORT 80-4.5 MCG/ACT inhaler Inhale 2 puffs into the lungs 2 (two) times daily as needed.   . traZODone (DESYREL) 50 MG tablet Take 0.5-1 tablets (25-50 mg total) by mouth at bedtime as needed for sleep.    No  facility-administered encounter medications on file as of 08/06/2019.    Goals        . Client will talk with LCSW in next 30 days to discuss depression symptoms of client (pt-stated)     Current Barriers:  . Breathing challenges of client . Transportation challenges in client with Chronic Diagnoses of COPD, CAD, GERD, HLD, HTN, MDD, DM . Mobility challenges   Clinical Social Work Clinical Goal(s):  [name] LCSW will talk with client in next 4 weeks to discuss client management of depression symptoms faced  Interventions:   Talked with Matthew Campos about RNCM support and LCSW support  Talked with Matthew Campos about Medicaid application process for Matthew Campos  Talked with Matthew Campos about DME that client already has at his home(has 3 in 1 BSC, lift chair, bed that elevates head and feet, walker, cane, wheelchair, nebulizer, oxygen concentrator)  Talked with Matthew Campos about client's decreased energy  Talked with Matthew Campos about client use of hospital bed  Talked with Matthew Campos about fluid issues of client (Matthew Campos said she gives client diuretic as prescribed)  Talked with Matthew Campos about client completion of ADLs  Patient Self Care Activities:  . Enjoys speaking via phone with his wife  Patient Self Care Deficits:       Needs help with daily ADLs       Needs help with medication administration  Initial Care Plan documentation      Follow Up Plan:LCSW to call client/spouse in next 4 weeks to talk with client/spouse about client management of depression symptoms faced.  Matthew Campos.Matthew Campos MSW, LCSW Licensed Clinical Social Worker [facility] Care Management [phone]

## 2019-08-06 NOTE — Patient Instructions (Addendum)
Licensed Clinical Social Worker Visit Information  Goals we discussed today:  Goals        . Client will talk with LCSW in next 30 days to discuss depression symptoms of client (pt-stated)     Current Barriers:  . Breathing challenges of client . Transportation challenges in client with Chronic Diagnoses of COPD, CAD, GERD, HLD, HTN, MDD, DM . Mobility challenges   Clinical Social Work Clinical Goal(s):  [name] LCSW will talk with client in next 4 weeks to discuss client management of depression symptoms faced  Interventions:  Talked with [name] about RNCM support and LCSW support  Talked with [name] about Medicaid application process for Kebin Merrick  Talked with [name] about DME that client already has at his home(has 3 in 1 BSC, lift chair, bed that elevates head and feet, walker, cane, wheelchair, nebulizer, oxygen concentrator)  Talked with [name] about client's decreased energy  Talked with [name] about client use of hospital bed  Talked with [name] about fluid issues of client ([name] said she gives client diuretic as prescribed)  Talked with [name] about client completion of ADLs  Patient Self Care Activities:  . Enjoys speaking via phone with his wife  Patient Self Care Deficits:       Needs help with daily ADLs       Needs help with medication administration  Initial Care Plan documentation     Materials Provided: No  Follow Up Plan:LCSW to call client/spouse in next 4 weeks to talk with client/spouse about client management of depression symptoms faced.  The patient/[place]ia Adriance, spouse,  verbalized understanding of instructions provided today and declined a print copy of patient instruction materials.   [name].Deadrian Toya MSW, LCSW Licensed Clinical Social Worker [facility] Care [facility] [phone]

## 2019-08-09 ENCOUNTER — Telehealth: Payer: Self-pay | Admitting: Family Medicine

## 2019-08-09 MED ORDER — FUROSEMIDE 80 MG PO TABS: 80.0000 mg | ORAL_TABLET | Freq: Two times a day (BID) | ORAL | 2 refills | Status: DC

## 2019-08-09 NOTE — Telephone Encounter (Signed)
Dr. Darnell Level,  Are you ok with refilling Lasix?  Per last note you wanted pt to follow up with cardio to possible switch to Torsemide since Lasix not helping as much. Looks like pt did contact cardio but pt unable to be seen in the clinic right away.  There are some notes that the pt's swelling is getting worse.

## 2019-08-09 NOTE — Telephone Encounter (Signed)
Refill sent. Wife aware

## 2019-08-09 NOTE — Telephone Encounter (Signed)
Ok to refill.  Patient has appt with cardiology at 215 tomorrow.

## 2019-08-09 NOTE — Telephone Encounter (Signed)
  Prescription Request  08/09/2019  What is the name of the medication or equipment? furosemide (LASIX) 80 MG tablet  Have you contacted your pharmacy to request a refill? (if applicable) yes needs new rx given at [place] would you like this sent to[facility]   Patient notified that their request is being sent to the clinical staff for review and that they should receive a response within 2 business days.

## 2019-08-10 DIAGNOSIS — I517 Cardiomegaly: Secondary | ICD-10-CM | POA: Diagnosis not present

## 2019-08-10 DIAGNOSIS — R0600 Dyspnea, unspecified: Secondary | ICD-10-CM | POA: Diagnosis not present

## 2019-08-10 DIAGNOSIS — J810 Acute pulmonary edema: Secondary | ICD-10-CM | POA: Diagnosis not present

## 2019-08-10 DIAGNOSIS — I5032 Chronic diastolic (congestive) heart failure: Secondary | ICD-10-CM | POA: Diagnosis not present

## 2019-08-11 DIAGNOSIS — Z952 Presence of prosthetic heart valve: Secondary | ICD-10-CM | POA: Diagnosis not present

## 2019-08-11 DIAGNOSIS — I5030 Unspecified diastolic (congestive) heart failure: Secondary | ICD-10-CM | POA: Diagnosis not present

## 2019-08-11 DIAGNOSIS — I259 Chronic ischemic heart disease, unspecified: Secondary | ICD-10-CM | POA: Diagnosis not present

## 2019-08-11 DIAGNOSIS — I4891 Unspecified atrial fibrillation: Secondary | ICD-10-CM | POA: Diagnosis not present

## 2019-08-11 DIAGNOSIS — G4733 Obstructive sleep apnea (adult) (pediatric): Secondary | ICD-10-CM | POA: Diagnosis not present

## 2019-08-11 DIAGNOSIS — I5033 Acute on chronic diastolic (congestive) heart failure: Secondary | ICD-10-CM | POA: Diagnosis not present

## 2019-08-11 DIAGNOSIS — I2722 Pulmonary hypertension due to left heart disease: Secondary | ICD-10-CM | POA: Diagnosis not present

## 2019-08-11 DIAGNOSIS — I2510 Atherosclerotic heart disease of native coronary artery without angina pectoris: Secondary | ICD-10-CM | POA: Diagnosis not present

## 2019-08-11 DIAGNOSIS — R001 Bradycardia, unspecified: Secondary | ICD-10-CM | POA: Diagnosis not present

## 2019-08-11 DIAGNOSIS — I4819 Other persistent atrial fibrillation: Secondary | ICD-10-CM | POA: Diagnosis not present

## 2019-08-12 DIAGNOSIS — G4733 Obstructive sleep apnea (adult) (pediatric): Secondary | ICD-10-CM | POA: Diagnosis not present

## 2019-08-12 DIAGNOSIS — Z95818 Presence of other cardiac implants and grafts: Secondary | ICD-10-CM | POA: Diagnosis not present

## 2019-08-12 DIAGNOSIS — I5030 Unspecified diastolic (congestive) heart failure: Secondary | ICD-10-CM | POA: Diagnosis not present

## 2019-08-12 DIAGNOSIS — I2722 Pulmonary hypertension due to left heart disease: Secondary | ICD-10-CM | POA: Diagnosis not present

## 2019-08-12 DIAGNOSIS — I2510 Atherosclerotic heart disease of native coronary artery without angina pectoris: Secondary | ICD-10-CM | POA: Diagnosis not present

## 2019-08-12 DIAGNOSIS — Z952 Presence of prosthetic heart valve: Secondary | ICD-10-CM | POA: Diagnosis not present

## 2019-08-12 DIAGNOSIS — I480 Paroxysmal atrial fibrillation: Secondary | ICD-10-CM | POA: Diagnosis not present

## 2019-08-12 DIAGNOSIS — Z955 Presence of coronary angioplasty implant and graft: Secondary | ICD-10-CM | POA: Diagnosis not present

## 2019-08-12 DIAGNOSIS — I5033 Acute on chronic diastolic (congestive) heart failure: Secondary | ICD-10-CM | POA: Diagnosis not present

## 2019-08-12 DIAGNOSIS — I4819 Other persistent atrial fibrillation: Secondary | ICD-10-CM | POA: Diagnosis not present

## 2019-08-13 DIAGNOSIS — Z95818 Presence of other cardiac implants and grafts: Secondary | ICD-10-CM | POA: Diagnosis not present

## 2019-08-13 DIAGNOSIS — I2510 Atherosclerotic heart disease of native coronary artery without angina pectoris: Secondary | ICD-10-CM | POA: Diagnosis not present

## 2019-08-13 DIAGNOSIS — I5033 Acute on chronic diastolic (congestive) heart failure: Secondary | ICD-10-CM | POA: Diagnosis not present

## 2019-08-13 DIAGNOSIS — Z955 Presence of coronary angioplasty implant and graft: Secondary | ICD-10-CM | POA: Diagnosis not present

## 2019-08-13 DIAGNOSIS — Z952 Presence of prosthetic heart valve: Secondary | ICD-10-CM | POA: Diagnosis not present

## 2019-08-14 DIAGNOSIS — Z952 Presence of prosthetic heart valve: Secondary | ICD-10-CM | POA: Diagnosis not present

## 2019-08-14 DIAGNOSIS — I2510 Atherosclerotic heart disease of native coronary artery without angina pectoris: Secondary | ICD-10-CM | POA: Diagnosis not present

## 2019-08-14 DIAGNOSIS — Z955 Presence of coronary angioplasty implant and graft: Secondary | ICD-10-CM | POA: Diagnosis not present

## 2019-08-14 DIAGNOSIS — Z95818 Presence of other cardiac implants and grafts: Secondary | ICD-10-CM | POA: Diagnosis not present

## 2019-08-14 DIAGNOSIS — I5033 Acute on chronic diastolic (congestive) heart failure: Secondary | ICD-10-CM | POA: Diagnosis not present

## 2019-08-15 DIAGNOSIS — I5033 Acute on chronic diastolic (congestive) heart failure: Secondary | ICD-10-CM | POA: Diagnosis not present

## 2019-08-15 DIAGNOSIS — Z952 Presence of prosthetic heart valve: Secondary | ICD-10-CM | POA: Diagnosis not present

## 2019-08-15 DIAGNOSIS — I2510 Atherosclerotic heart disease of native coronary artery without angina pectoris: Secondary | ICD-10-CM | POA: Diagnosis not present

## 2019-08-15 DIAGNOSIS — Z95818 Presence of other cardiac implants and grafts: Secondary | ICD-10-CM | POA: Diagnosis not present

## 2019-08-15 DIAGNOSIS — Z955 Presence of coronary angioplasty implant and graft: Secondary | ICD-10-CM | POA: Diagnosis not present

## 2019-08-16 ENCOUNTER — Ambulatory Visit: Payer: Medicare Other | Admitting: *Deleted

## 2019-08-16 DIAGNOSIS — Z934 Other artificial openings of gastrointestinal tract status: Secondary | ICD-10-CM | POA: Diagnosis not present

## 2019-08-16 DIAGNOSIS — I509 Heart failure, unspecified: Secondary | ICD-10-CM

## 2019-08-16 DIAGNOSIS — I482 Chronic atrial fibrillation, unspecified: Secondary | ICD-10-CM | POA: Diagnosis not present

## 2019-08-16 DIAGNOSIS — I2510 Atherosclerotic heart disease of native coronary artery without angina pectoris: Secondary | ICD-10-CM

## 2019-08-16 DIAGNOSIS — I4891 Unspecified atrial fibrillation: Secondary | ICD-10-CM | POA: Diagnosis not present

## 2019-08-16 DIAGNOSIS — D649 Anemia, unspecified: Secondary | ICD-10-CM | POA: Diagnosis not present

## 2019-08-16 DIAGNOSIS — Z952 Presence of prosthetic heart valve: Secondary | ICD-10-CM | POA: Diagnosis not present

## 2019-08-16 DIAGNOSIS — I5030 Unspecified diastolic (congestive) heart failure: Secondary | ICD-10-CM | POA: Diagnosis not present

## 2019-08-16 DIAGNOSIS — R001 Bradycardia, unspecified: Secondary | ICD-10-CM | POA: Diagnosis not present

## 2019-08-16 MED ORDER — PANTOPRAZOLE SODIUM 40 MG PO TBEC
40.00 | DELAYED_RELEASE_TABLET | ORAL | Status: DC
Start: 2019-08-17 — End: 2019-08-16

## 2019-08-16 MED ORDER — DEXTROSE 10 % IV SOLN
125.00 | INTRAVENOUS | Status: DC
Start: ? — End: 2019-08-16

## 2019-08-16 MED ORDER — POLYETHYLENE GLYCOL 3350 17 GM/SCOOP PO POWD
17.00 | ORAL | Status: DC
Start: 2019-08-18 — End: 2019-08-16

## 2019-08-16 MED ORDER — ALBUTEROL SULFATE (2.5 MG/3ML) 0.083% IN NEBU
2.50 | INHALATION_SOLUTION | RESPIRATORY_TRACT | Status: DC
Start: ? — End: 2019-08-16

## 2019-08-16 MED ORDER — MELATONIN 3 MG PO TABS
3.00 | ORAL_TABLET | ORAL | Status: DC
Start: ? — End: 2019-08-16

## 2019-08-16 MED ORDER — ROSUVASTATIN CALCIUM 5 MG PO TABS
10.00 | ORAL_TABLET | ORAL | Status: DC
Start: 2019-08-18 — End: 2019-08-16

## 2019-08-16 MED ORDER — ASPIRIN 81 MG PO TBEC
81.00 | DELAYED_RELEASE_TABLET | ORAL | Status: DC
Start: 2019-08-18 — End: 2019-08-16

## 2019-08-16 MED ORDER — BUPIVACAINE HCL (PF) 0.25 % IJ SOLN
INTRAMUSCULAR | Status: DC
Start: ? — End: 2019-08-16

## 2019-08-16 MED ORDER — LORAZEPAM 0.5 MG PO TABS
0.50 | ORAL_TABLET | ORAL | Status: DC
Start: ? — End: 2019-08-16

## 2019-08-16 MED ORDER — GENERIC EXTERNAL MEDICATION
5.00 | Status: DC
Start: ? — End: 2019-08-16

## 2019-08-16 MED ORDER — SPIRONOLACTONE 25 MG PO TABS
25.00 | ORAL_TABLET | ORAL | Status: DC
Start: 2019-08-18 — End: 2019-08-16

## 2019-08-16 MED ORDER — ESCITALOPRAM OXALATE 10 MG PO TABS
10.00 | ORAL_TABLET | ORAL | Status: DC
Start: 2019-08-18 — End: 2019-08-16

## 2019-08-16 MED ORDER — GLUCOSE 40 % PO GEL
15.00 | ORAL | Status: DC
Start: ? — End: 2019-08-16

## 2019-08-16 MED ORDER — INSULIN LISPRO 100 UNIT/ML ~~LOC~~ SOLN
1.00 | SUBCUTANEOUS | Status: DC
Start: 2019-08-18 — End: 2019-08-16

## 2019-08-16 MED ORDER — ONDANSETRON HCL 4 MG PO TABS
4.00 | ORAL_TABLET | ORAL | Status: DC
Start: ? — End: 2019-08-16

## 2019-08-16 MED ORDER — ENOXAPARIN SODIUM 40 MG/0.4ML ~~LOC~~ SOLN
40.00 | SUBCUTANEOUS | Status: DC
Start: 2019-08-18 — End: 2019-08-16

## 2019-08-16 MED ORDER — FERROUS SULFATE 325 (65 FE) MG PO TABS
325.00 | ORAL_TABLET | ORAL | Status: DC
Start: 2019-08-18 — End: 2019-08-16

## 2019-08-16 MED ORDER — BUDESONIDE-FORMOTEROL FUMARATE 80-4.5 MCG/ACT IN AERO
2.00 | INHALATION_SPRAY | RESPIRATORY_TRACT | Status: DC
Start: 2019-08-17 — End: 2019-08-16

## 2019-08-16 NOTE — Chronic Care Management (AMB) (Signed)
  Chronic Care Management   Outreach Note  08/16/2019 Name: Matthew Campos MRN: [id number] DOB: [birth year]  Referred by: [name], DO Reason for referral : Chronic Care Management (RN follow up)   An unsuccessful telephone follow-up was attempted today. The patient was referred to the case management team for assistance with care management and care coordination.    Matthew Campos is currently inpatient at [facility] and is undergoing a pacemaker implant today.   Follow Up Plan: The care management team will reach out to the patient again over the next 10 days.   Chong Sicilian, BSN, RN-BC Embedded Chronic Care Manager [facility] / [place] [facility] Direct Dial: [phone]

## 2019-08-17 ENCOUNTER — Other Ambulatory Visit: Payer: Self-pay | Admitting: Family Medicine

## 2019-08-17 ENCOUNTER — Telehealth: Payer: Self-pay | Admitting: *Deleted

## 2019-08-17 DIAGNOSIS — R7989 Other specified abnormal findings of blood chemistry: Secondary | ICD-10-CM

## 2019-08-17 DIAGNOSIS — K819 Cholecystitis, unspecified: Secondary | ICD-10-CM | POA: Diagnosis not present

## 2019-08-17 DIAGNOSIS — Z4682 Encounter for fitting and adjustment of non-vascular catheter: Secondary | ICD-10-CM | POA: Diagnosis not present

## 2019-08-17 DIAGNOSIS — I4891 Unspecified atrial fibrillation: Secondary | ICD-10-CM | POA: Diagnosis not present

## 2019-08-17 DIAGNOSIS — D649 Anemia, unspecified: Secondary | ICD-10-CM | POA: Diagnosis not present

## 2019-08-17 DIAGNOSIS — Z934 Other artificial openings of gastrointestinal tract status: Secondary | ICD-10-CM | POA: Diagnosis not present

## 2019-08-17 DIAGNOSIS — R001 Bradycardia, unspecified: Secondary | ICD-10-CM | POA: Diagnosis not present

## 2019-08-17 DIAGNOSIS — I5030 Unspecified diastolic (congestive) heart failure: Secondary | ICD-10-CM | POA: Diagnosis not present

## 2019-08-17 DIAGNOSIS — R112 Nausea with vomiting, unspecified: Secondary | ICD-10-CM

## 2019-08-17 NOTE — Telephone Encounter (Signed)
The pt will be discharged later today and needed follow up appt scheduled next week. PCP is on vacation so scheduled TOC appt with [facility] on 08/27/19 at 11:10-just needs the [facility] call once discharged.

## 2019-08-18 ENCOUNTER — Other Ambulatory Visit: Payer: Self-pay | Admitting: Family Medicine

## 2019-08-18 ENCOUNTER — Telehealth: Payer: Self-pay | Admitting: *Deleted

## 2019-08-18 MED ORDER — LEVOTHYROXINE SODIUM 100 MCG PO TABS: 100.0000 ug | ORAL_TABLET | Freq: Every day | ORAL | 3 refills | Status: DC

## 2019-08-18 NOTE — Telephone Encounter (Signed)
    Transitional Care Management  Contact Attempt Attempt Date:08/18/2019 Attempted By: Lynnea Ferrier, LPN  1st unsuccessful TCM contact attempt.   I reached out to Matthew Campos on his preferred telephone number to discuss Transitional Care Management, medication reconciliation, and to schedule a TCM hospital follow-up with his PCP at [facility].  Discharge Date: 08/17/2019 Location: [facility]  Discharge Dx: Heart Failure  Recommendations for Outpatient Follow-up:  Follow up with your doctor within 7 days and as directed: You may need to return for other tests. You may need home health care. A healthcare provider will monitor your vital signs, weight, and make sure your medicines are working. Write down your questions so you remember to ask them during your visits. Join a support group: Heart failure can be difficult to manage. It may be helpful to talk with others who have heart failure. You may learn how to better manage your condition or get emotional support. For more information:  [facility] [address] [place] [address]  Phone: [phone] Web Address: [email or web address].org      Plan I was unable to leave a  HIPPA compliant message for him to return my call do to no voicemail.  Will attempt to contact again within the 2 business day post discharge window if he does not return my call.

## 2019-08-18 NOTE — Telephone Encounter (Signed)
He is on the 158mcg, which was sent by me earlier this year.  Not sure how the 150 keeps getting put back on his list.  I have confirmed that wife is giving him the 179mcg and have renewed that rx.

## 2019-08-19 DIAGNOSIS — Z48812 Encounter for surgical aftercare following surgery on the circulatory system: Secondary | ICD-10-CM | POA: Diagnosis not present

## 2019-08-19 DIAGNOSIS — J9611 Chronic respiratory failure with hypoxia: Secondary | ICD-10-CM | POA: Diagnosis not present

## 2019-08-19 DIAGNOSIS — E119 Type 2 diabetes mellitus without complications: Secondary | ICD-10-CM | POA: Diagnosis not present

## 2019-08-19 DIAGNOSIS — I4891 Unspecified atrial fibrillation: Secondary | ICD-10-CM | POA: Diagnosis not present

## 2019-08-19 DIAGNOSIS — Z950 Presence of cardiac pacemaker: Secondary | ICD-10-CM | POA: Diagnosis not present

## 2019-08-19 DIAGNOSIS — I5030 Unspecified diastolic (congestive) heart failure: Secondary | ICD-10-CM | POA: Diagnosis not present

## 2019-08-19 DIAGNOSIS — Z934 Other artificial openings of gastrointestinal tract status: Secondary | ICD-10-CM | POA: Diagnosis not present

## 2019-08-19 DIAGNOSIS — I714 Abdominal aortic aneurysm, without rupture: Secondary | ICD-10-CM | POA: Diagnosis not present

## 2019-08-19 DIAGNOSIS — I493 Ventricular premature depolarization: Secondary | ICD-10-CM | POA: Diagnosis not present

## 2019-08-19 DIAGNOSIS — I2510 Atherosclerotic heart disease of native coronary artery without angina pectoris: Secondary | ICD-10-CM | POA: Diagnosis not present

## 2019-08-19 DIAGNOSIS — M6281 Muscle weakness (generalized): Secondary | ICD-10-CM | POA: Diagnosis not present

## 2019-08-19 DIAGNOSIS — D649 Anemia, unspecified: Secondary | ICD-10-CM | POA: Diagnosis not present

## 2019-08-19 DIAGNOSIS — K819 Cholecystitis, unspecified: Secondary | ICD-10-CM | POA: Diagnosis not present

## 2019-08-19 DIAGNOSIS — J449 Chronic obstructive pulmonary disease, unspecified: Secondary | ICD-10-CM | POA: Diagnosis not present

## 2019-08-20 NOTE — Telephone Encounter (Signed)
TRANSITIONAL CARE MANAGEMENT TELEPHONE OUTREACH NOTE   Contact Date: 08/20/2019 Contacted By: Lynnea Ferrier, LPN   DISCHARGE INFORMATION Date of Discharge:08/17/19 Discharge Facility: [facility]  Principal Discharge Diagnosis:Atrial fibrillation with SVR, POA, active #Bradycardia, POA, active Pt bradycardic overnight since admission but asymptomatic, on EKG was noted to be in A. fib with SVR. Zio patch from 02/25/2019 showed continuous A. Fib, concern for contribution to worsening heart failure with frequent exacerbations. TSH WNL.     Outpatient Follow Up Recommendations (copied from discharge summary) Waiting on discharge Summary from [place] is a male primary care patient of [name], DO. An outgoing telephone call was made today and I spoke with [place] patients wife.  Mr. Hincapie condition(s) and treatment(s) were discussed. An opportunity to ask questions was provided and all were answered or forwarded as appropriate.    ACTIVITIES OF DAILY LIVING  FALLON HOWERTER lives with their spouse and he cannot perform ADLs independently. his primary caregiver is [place]. he is able to depend on his primary caregiver(s) for consistent help. Transportation to appointments, to pick up medications, and to run errands is not a problem.  (Consider referral to [facility] CCM if transportation or a consistent caregiver is a problem)   Fall Risk Fall Risk  07/26/2019 07/23/2019  Falls in the past year? - 1  Number falls in past yr: 1 1  Injury with Fall? 1 0  Comment Skin tears/ abrasions -  Risk for fall due to : History of fall(s);Impaired balance/gait;Impaired mobility History of fall(s);Impaired balance/gait;Impaired mobility  Follow up Falls evaluation completed Falls evaluation completed    high Jenkins Modifications/Assistive Devices Wheelchair: Yes Cane: No Ramp: Yes Bedside Toilet: Yes Hospital Bed:  Yes Other:  Walker at times   [employer] he is receiving home health Physical therapy and nursing services.     MEDICATION RECONCILIATION  Mr. Sorbo has been able to pick-up all prescribed discharge medications from the pharmacy.   A post discharge medication reconciliation was performed and the complete medication list was reviewed with the patient/caregiver and is current as of 08/20/2019. Changes highlighted below.  Discontinued Medications Furosemide  Current Medication List Patient was started on  torsemide (DEMADEX) 20 MG tablet  Take 2 tablets (40 mg total) by mouth in the morning and at bedtime. Start taking on 08/18/19. 120 tablet  1 08/17/2019     Allergies as of 08/18/2019      Reactions   Codeine Nausea And Vomiting   Codeine    Latex Hives, Itching   Lisinopril    Lisinopril Cough      Medication List       Accurate as of Aug 18, 2019 11:59 PM. If you have any questions, ask your nurse or doctor.        acetaZOLAMIDE 250 MG tablet Commonly known as: DIAMOX Take by mouth.   albuterol (2.5 MG/3ML) 0.083% nebulizer solution Commonly known as: PROVENTIL Take 2.5 mg by nebulization every 6 (six) hours as needed for wheezing or shortness of breath.   albuterol (2.5 MG/3ML) 0.083% nebulizer solution Commonly known as: PROVENTIL Take 2.5 mg by nebulization every 6 (six) hours as needed for wheezing or shortness of breath.   albuterol 108 (90 Base) MCG/ACT inhaler Commonly known as: VENTOLIN HFA Inhale 2 puffs into the lungs every 6 (six) hours as needed for wheezing or shortness of breath.   albuterol 108 (90 Base) MCG/ACT  inhaler Commonly known as: VENTOLIN HFA Inhale 2 puffs into the lungs every 6 (six) hours as needed. For shortness of breath.   aspirin EC 81 MG tablet Take 81 mg by mouth daily.   budesonide-formoterol 80-4.5 MCG/ACT inhaler Commonly known as: SYMBICORT Inhale 2 puffs into the lungs 2 (two) times daily.   Symbicort 80-4.5 MCG/ACT  inhaler Generic drug: budesonide-formoterol Inhale 2 puffs into the lungs 2 (two) times daily as needed.   escitalopram 10 MG tablet Commonly known as: LEXAPRO TAKE ONE (1) TABLET EACH DAY   ferrous sulfate 325 (65 FE) MG EC tablet Take by mouth.   furosemide 80 MG tablet Commonly known as: LASIX Take 1 tablet (80 mg total) by mouth 2 (two) times daily.   levothyroxine 100 MCG tablet Commonly known as: SYNTHROID Take 1 tablet (100 mcg total) by mouth daily. What changed:   medication strength  how much to take  when to take this Changed by: Ronnie Doss, DO   LORazepam 0.5 MG tablet Commonly known as: ATIVAN Take 1 tablet by mouth at bedtime as needed.   lubiprostone 24 MCG capsule Commonly known as: Amitiza Take 1 capsule (24 mcg total) by mouth 2 (two) times daily with a meal. For constipation   multivitamin with minerals Tabs tablet Take 1 tablet by mouth daily.   nitroGLYCERIN 0.4 MG SL tablet Commonly known as: NITROSTAT Place 0.4 mg under the tongue every 5 (five) minutes as needed. For chest pain   ondansetron 4 MG disintegrating tablet Commonly known as: ZOFRAN-ODT TAKE 1 TABLET EVERY 8 HOURS AS NEEDED FOR NAUSEA AND VOMITING   OneTouch Verio test strip Generic drug: glucose blood TID Dx E11.9   OneTouch Verio w/Device Kit TID   pantoprazole 40 MG tablet Commonly known as: PROTONIX Take 1 tablet (40 mg total) by mouth daily.   rosuvastatin 10 MG tablet Commonly known as: CRESTOR Take 10 mg by mouth daily.   sitaGLIPtin 50 MG tablet Commonly known as: Januvia Take 1 tablet (50 mg total) by mouth daily. (renally dosed) Please make sure patient aware of change   spironolactone 25 MG tablet Commonly known as: ALDACTONE Take by mouth.   Systane Balance 0.6 % Soln Generic drug: Propylene Glycol Apply 1 drop to eye daily as needed (1 GTT OU QD PRN FOR ALLERGIES.).   traZODone 50 MG tablet Commonly known as: DESYREL Take 0.5-1 tablets  (25-50 mg total) by mouth at bedtime as needed for sleep.        PATIENT EDUCATION & FOLLOW-UP PLAN  An appointment for Transitional Care Management is scheduled with Evelina Dun, FNP on 08/27/19 at 11:10am.  Take all medications as prescribed  Contact our office by calling [phone] if you have any questions or concerns.

## 2019-08-23 DIAGNOSIS — J9611 Chronic respiratory failure with hypoxia: Secondary | ICD-10-CM | POA: Diagnosis not present

## 2019-08-23 DIAGNOSIS — D649 Anemia, unspecified: Secondary | ICD-10-CM | POA: Diagnosis not present

## 2019-08-23 DIAGNOSIS — I5030 Unspecified diastolic (congestive) heart failure: Secondary | ICD-10-CM | POA: Diagnosis not present

## 2019-08-23 DIAGNOSIS — Z48812 Encounter for surgical aftercare following surgery on the circulatory system: Secondary | ICD-10-CM | POA: Diagnosis not present

## 2019-08-23 DIAGNOSIS — Z934 Other artificial openings of gastrointestinal tract status: Secondary | ICD-10-CM | POA: Diagnosis not present

## 2019-08-23 DIAGNOSIS — K819 Cholecystitis, unspecified: Secondary | ICD-10-CM | POA: Diagnosis not present

## 2019-08-23 DIAGNOSIS — J449 Chronic obstructive pulmonary disease, unspecified: Secondary | ICD-10-CM | POA: Diagnosis not present

## 2019-08-23 DIAGNOSIS — I714 Abdominal aortic aneurysm, without rupture: Secondary | ICD-10-CM | POA: Diagnosis not present

## 2019-08-23 DIAGNOSIS — I2510 Atherosclerotic heart disease of native coronary artery without angina pectoris: Secondary | ICD-10-CM | POA: Diagnosis not present

## 2019-08-23 DIAGNOSIS — M6281 Muscle weakness (generalized): Secondary | ICD-10-CM | POA: Diagnosis not present

## 2019-08-23 DIAGNOSIS — Z950 Presence of cardiac pacemaker: Secondary | ICD-10-CM | POA: Diagnosis not present

## 2019-08-23 DIAGNOSIS — I4891 Unspecified atrial fibrillation: Secondary | ICD-10-CM | POA: Diagnosis not present

## 2019-08-23 DIAGNOSIS — E119 Type 2 diabetes mellitus without complications: Secondary | ICD-10-CM | POA: Diagnosis not present

## 2019-08-24 ENCOUNTER — Other Ambulatory Visit: Payer: Self-pay | Admitting: Family Medicine

## 2019-08-24 DIAGNOSIS — K819 Cholecystitis, unspecified: Secondary | ICD-10-CM | POA: Diagnosis not present

## 2019-08-24 DIAGNOSIS — J9611 Chronic respiratory failure with hypoxia: Secondary | ICD-10-CM | POA: Diagnosis not present

## 2019-08-24 DIAGNOSIS — Z934 Other artificial openings of gastrointestinal tract status: Secondary | ICD-10-CM | POA: Diagnosis not present

## 2019-08-24 DIAGNOSIS — I4891 Unspecified atrial fibrillation: Secondary | ICD-10-CM | POA: Diagnosis not present

## 2019-08-24 DIAGNOSIS — I2510 Atherosclerotic heart disease of native coronary artery without angina pectoris: Secondary | ICD-10-CM | POA: Diagnosis not present

## 2019-08-24 DIAGNOSIS — D649 Anemia, unspecified: Secondary | ICD-10-CM | POA: Diagnosis not present

## 2019-08-24 DIAGNOSIS — I714 Abdominal aortic aneurysm, without rupture: Secondary | ICD-10-CM | POA: Diagnosis not present

## 2019-08-24 DIAGNOSIS — I5030 Unspecified diastolic (congestive) heart failure: Secondary | ICD-10-CM | POA: Diagnosis not present

## 2019-08-24 DIAGNOSIS — M6281 Muscle weakness (generalized): Secondary | ICD-10-CM | POA: Diagnosis not present

## 2019-08-24 DIAGNOSIS — Z950 Presence of cardiac pacemaker: Secondary | ICD-10-CM | POA: Diagnosis not present

## 2019-08-24 DIAGNOSIS — Z48812 Encounter for surgical aftercare following surgery on the circulatory system: Secondary | ICD-10-CM | POA: Diagnosis not present

## 2019-08-24 DIAGNOSIS — E119 Type 2 diabetes mellitus without complications: Secondary | ICD-10-CM | POA: Diagnosis not present

## 2019-08-24 DIAGNOSIS — J449 Chronic obstructive pulmonary disease, unspecified: Secondary | ICD-10-CM | POA: Diagnosis not present

## 2019-08-26 DIAGNOSIS — J449 Chronic obstructive pulmonary disease, unspecified: Secondary | ICD-10-CM | POA: Diagnosis not present

## 2019-08-26 DIAGNOSIS — J9611 Chronic respiratory failure with hypoxia: Secondary | ICD-10-CM | POA: Diagnosis not present

## 2019-08-26 DIAGNOSIS — K819 Cholecystitis, unspecified: Secondary | ICD-10-CM | POA: Diagnosis not present

## 2019-08-26 DIAGNOSIS — D649 Anemia, unspecified: Secondary | ICD-10-CM | POA: Diagnosis not present

## 2019-08-26 DIAGNOSIS — I714 Abdominal aortic aneurysm, without rupture: Secondary | ICD-10-CM | POA: Diagnosis not present

## 2019-08-26 DIAGNOSIS — Z950 Presence of cardiac pacemaker: Secondary | ICD-10-CM | POA: Diagnosis not present

## 2019-08-26 DIAGNOSIS — Z934 Other artificial openings of gastrointestinal tract status: Secondary | ICD-10-CM | POA: Diagnosis not present

## 2019-08-26 DIAGNOSIS — I2510 Atherosclerotic heart disease of native coronary artery without angina pectoris: Secondary | ICD-10-CM | POA: Diagnosis not present

## 2019-08-26 DIAGNOSIS — M6281 Muscle weakness (generalized): Secondary | ICD-10-CM | POA: Diagnosis not present

## 2019-08-26 DIAGNOSIS — Z48812 Encounter for surgical aftercare following surgery on the circulatory system: Secondary | ICD-10-CM | POA: Diagnosis not present

## 2019-08-26 DIAGNOSIS — E119 Type 2 diabetes mellitus without complications: Secondary | ICD-10-CM | POA: Diagnosis not present

## 2019-08-26 DIAGNOSIS — I5030 Unspecified diastolic (congestive) heart failure: Secondary | ICD-10-CM | POA: Diagnosis not present

## 2019-08-26 DIAGNOSIS — I4891 Unspecified atrial fibrillation: Secondary | ICD-10-CM | POA: Diagnosis not present

## 2019-08-27 ENCOUNTER — Other Ambulatory Visit: Payer: Self-pay

## 2019-08-27 ENCOUNTER — Ambulatory Visit (INDEPENDENT_AMBULATORY_CARE_PROVIDER_SITE_OTHER): Payer: Medicare Other | Admitting: Family

## 2019-08-27 ENCOUNTER — Encounter: Payer: Self-pay | Admitting: Family

## 2019-08-27 VITALS — BP 108/60 | HR 70 | Temp 97.9°F | Ht 68.0 in | Wt 179.4 lb

## 2019-08-27 DIAGNOSIS — D649 Anemia, unspecified: Secondary | ICD-10-CM | POA: Diagnosis not present

## 2019-08-27 DIAGNOSIS — E119 Type 2 diabetes mellitus without complications: Secondary | ICD-10-CM | POA: Diagnosis not present

## 2019-08-27 DIAGNOSIS — J449 Chronic obstructive pulmonary disease, unspecified: Secondary | ICD-10-CM | POA: Diagnosis not present

## 2019-08-27 DIAGNOSIS — I5033 Acute on chronic diastolic (congestive) heart failure: Secondary | ICD-10-CM | POA: Diagnosis not present

## 2019-08-27 DIAGNOSIS — K819 Cholecystitis, unspecified: Secondary | ICD-10-CM | POA: Diagnosis not present

## 2019-08-27 DIAGNOSIS — I714 Abdominal aortic aneurysm, without rupture: Secondary | ICD-10-CM | POA: Diagnosis not present

## 2019-08-27 DIAGNOSIS — I4891 Unspecified atrial fibrillation: Secondary | ICD-10-CM | POA: Diagnosis not present

## 2019-08-27 DIAGNOSIS — M6281 Muscle weakness (generalized): Secondary | ICD-10-CM | POA: Diagnosis not present

## 2019-08-27 DIAGNOSIS — Z48812 Encounter for surgical aftercare following surgery on the circulatory system: Secondary | ICD-10-CM | POA: Diagnosis not present

## 2019-08-27 DIAGNOSIS — Z09 Encounter for follow-up examination after completed treatment for conditions other than malignant neoplasm: Secondary | ICD-10-CM | POA: Diagnosis not present

## 2019-08-27 DIAGNOSIS — J9611 Chronic respiratory failure with hypoxia: Secondary | ICD-10-CM | POA: Diagnosis not present

## 2019-08-27 DIAGNOSIS — I480 Paroxysmal atrial fibrillation: Secondary | ICD-10-CM

## 2019-08-27 DIAGNOSIS — Z950 Presence of cardiac pacemaker: Secondary | ICD-10-CM | POA: Diagnosis not present

## 2019-08-27 DIAGNOSIS — I2510 Atherosclerotic heart disease of native coronary artery without angina pectoris: Secondary | ICD-10-CM | POA: Diagnosis not present

## 2019-08-27 DIAGNOSIS — I5030 Unspecified diastolic (congestive) heart failure: Secondary | ICD-10-CM | POA: Diagnosis not present

## 2019-08-27 DIAGNOSIS — Z934 Other artificial openings of gastrointestinal tract status: Secondary | ICD-10-CM | POA: Diagnosis not present

## 2019-08-27 MED ORDER — TORSEMIDE 20 MG PO TABS: 40.0000 mg | ORAL_TABLET | Freq: Two times a day (BID) | ORAL | 2 refills | Status: DC

## 2019-08-27 NOTE — Progress Notes (Signed)
Subjective:    Patient ID: Matthew Campos, male    DOB: [birth year], 79 y.o.   MRN: [id number]  Chief Complaint  Patient presents with  . Hospitalization Follow-up    HPI Today's visit is for Transitional Care Management.  The patient was discharged from [facility]  on 08/17/19 with a primary diagnosis of A Fib and CHF.   Contact with the patient and/or caregiver, by a clinical staff member, was made on 08/18/19 and was documented as a telephone encounter within the EMR.  Through chart review and discussion with the patient I have determined that management of their condition is of high complexity.    Pt went to the ED on 08/10/19 after his Cardiologists visit with a 30 lb weight gain and edema. He was diuresed with Lasix. "Ultimately, 17 L of fluid were removed and dry weight at time of discharge was 171 lb (78 kg). He will start oral torsemide 40 mg BID per heart failure and follow up with them in their clinic. He should weight himself daily and call his cardiologist for weight gain of >3 lbs."  He reports he was 174 lbs when discharged and weighs 179 lb today. He follow up with Cardiologists 08/31/19.   He was also in A Fib and has pacemaker in place. He has follow up with EP next week.   He has cholecystitis requiring cholecystostomy tube placement in the Sept 2020 and was not a surgical candidate. He has a follow up with IR for tube replacement.   His losartan was held related to BP.   Review of Systems  Constitutional: Positive for fatigue.  Respiratory: Positive for shortness of breath.   Cardiovascular: Positive for leg swelling.  All other systems reviewed and are negative.      Objective:   Physical Exam Vitals reviewed.  Constitutional:      General: He is not in acute distress.    Appearance: He is well-developed.  HENT:     Head: Normocephalic.     Right Ear: Tympanic membrane normal.     Left Ear: Tympanic membrane normal.  Eyes:     General:         Right eye: No discharge.        Left eye: No discharge.     Pupils: Pupils are equal, round, and reactive to light.  Neck:     Thyroid: No thyromegaly.  Cardiovascular:     Rate and Rhythm: Normal rate and regular rhythm.     Heart sounds: Normal heart sounds. No murmur.  Pulmonary:     Effort: Pulmonary effort is normal. No respiratory distress.     Breath sounds: Normal breath sounds. No wheezing.  Abdominal:     General: Bowel sounds are normal. There is no distension.     Palpations: Abdomen is soft.     Tenderness: There is no abdominal tenderness.     Comments: cholecystostomy drain in place, dressing intact  Musculoskeletal:        General: No tenderness. Normal range of motion.     Cervical back: Normal range of motion and neck supple.     Right lower leg: 1+ Edema present.     Left lower leg: 1+ Edema present.  Skin:    General: Skin is warm and dry.     Coloration: Skin is pale.     Findings: No erythema or rash.     Comments: grayish   Neurological:     Mental  Status: He is alert and oriented to person, place, and time.     Cranial Nerves: No cranial nerve deficit.     Deep Tendon Reflexes: Reflexes are normal and symmetric.  Psychiatric:        Behavior: Behavior normal.        Thought Content: Thought content normal.        Judgment: Judgment normal.       BP 108/60   Pulse 70   Temp 97.9 F (36.6 C) (Temporal)   Ht '5\' 8"'$  (1.727 m)   Wt 179 lb 6.4 oz (81.4 kg)   SpO2 93%   BMI 27.28 kg/m      Assessment & Plan:  Matthew Campos comes in today with chief complaint of Hospitalization Follow-up   Diagnosis and orders addressed:  1. Acute on chronic diastolic heart failure (HCC) - torsemide (DEMADEX) 20 MG tablet; Take 2 tablets (40 mg total) by mouth 2 (two) times daily.  Dispense: 180 tablet; Refill: 2 - BMP8+EGFR - CBC with Differential/Platelet  2. Paroxysmal atrial fibrillation (HCC) - BMP8+EGFR - CBC with  Differential/Platelet  3. Hospital discharge follow-up - BMP8+EGFR - CBC with Differential/Platelet  4. Cholecystitis - BMP8+EGFR - CBC with Differential/Platelet  Will continue to hold Losartan at this time, given recent fluid gain and will more than likely take an extra Demadex. If restart would be 25 mg for kidney protection.  He will call Cardiologists today and discuss weight gain. Encouraged daily weight gain.  Strict low salt diet Labs pending Continue all medications and keep specialists follow up     Evelina Dun, FNP

## 2019-08-27 NOTE — Patient Instructions (Signed)
Heart Failure, Diagnosis  Heart failure is a condition in which the heart has trouble pumping blood because it has become weak or stiff. This means that the heart does not pump blood well enough for the body to stay healthy. For some people with heart failure, fluid may back up into the lungs. There may also be swelling (edema) in the lower legs. Heart failure is usually a long-term (chronic) condition. It is important for you to take good care of yourself and follow the treatment plan from your health care provider. What are the causes? This condition may be caused by:  High blood pressure (hypertension). Hypertension causes the heart muscle to work harder than normal. This makes the heart stiff or weak.  Coronary artery disease, or CAD. CAD is the buildup of cholesterol and fat (plaque) in the arteries of the heart.  Heart attack, also called myocardial infarction. This injures the heart muscle, making it hard for the heart to pump blood.  Abnormal heart valves. The valves do not open and close properly, forcing the heart to pump harder to keep the blood flowing.  Heart muscle disease (cardiomyopathy or myocarditis). This is damage to the heart muscle. It can increase the risk of heart failure.  Lung disease. The heart works harder when the lungs are not healthy.  Abnormal heart rhythms. These can lead to heart failure. What increases the risk? The risk of heart failure increases as a person ages. This condition is also more likely to develop in people who:  Are overweight.  Are male.  Smoke or chew tobacco.  Abuse alcohol or illegal drugs.  Have taken medicines that can damage the heart, such as chemotherapy drugs.  Have diabetes.  Have abnormal heart rhythms.  Have thyroid problems.  Have low blood counts (anemia). What are the signs or symptoms? Symptoms of this condition include:  Shortness of breath with activity, such as when climbing stairs.  A cough that does not  go away.  Swelling of the feet, ankles, legs, or abdomen.  Losing weight for no reason.  Trouble breathing when lying flat (orthopnea).  Waking from sleep because of the need to sit up and get more air.  Rapid heartbeat.  Tiredness (fatigue) and loss of energy.  Feeling light-headed, dizzy, or close to fainting.  Loss of appetite.  Nausea.  Waking up more often during the night to urinate (nocturia).  Confusion. How is this diagnosed? This condition is diagnosed based on:  Your medical history, symptoms, and a physical exam.  Diagnostic tests, which may include: ? Echocardiogram. ? Electrocardiogram (ECG). ? Chest X-ray. ? Blood tests. ? Exercise stress test. ? Radionuclide scans. ? Cardiac catheterization and angiogram. How is this treated? Treatment for this condition is aimed at managing the symptoms of heart failure. Medicines Treatment may include medicines that:  Help lower blood pressure by relaxing (dilating) the blood vessels. These medicines are called ACE inhibitors (angiotensin-converting enzyme) and ARBs (angiotensin receptor blockers).  Cause the kidneys to remove salt and water from the blood through urination (diuretics).  Improve heart muscle strength and prevent the heart from beating too fast (beta blockers).  Increase the force of the heartbeat (digoxin). Healthy behavior changes     Treatment may also include making healthy lifestyle changes, such as:  Reaching and staying at a healthy weight.  Quitting smoking or chewing tobacco.  Eating heart-healthy foods.  Limiting or avoiding alcohol.  Stopping the use of illegal drugs.  Being physically active.  Other treatments   Other treatments may include:  Procedures to open blocked arteries or repair damaged valves.  Placing a pacemaker to improve heart function (cardiac resynchronization therapy).  Placing a device to treat serious abnormal heart rhythms (implantable cardioverter  defibrillator, or ICD).  Placing a device to improve the pumping ability of the heart (left ventricular assist device, or LVAD).  Receiving a healthy heart from a donor (heart transplant). This is done when other treatments have not helped. Follow these instructions at home:  Manage other health conditions as told by your health care provider. These may include hypertension, diabetes, thyroid disease, or abnormal heart rhythms.  Get ongoing education and support as needed. Learn as much as you can about heart failure.  Keep all follow-up visits as told by your health care provider. This is important. Summary  Heart failure is a condition in which the heart has trouble pumping blood because it has become weak or stiff.  This condition is caused by high blood pressure and other diseases of the heart and lungs.  Symptoms of this condition include shortness of breath, tiredness (fatigue), nausea, and swelling of the feet, ankles, legs, or abdomen.  Treatments for this condition may include medicines, lifestyle changes, and surgery.  Manage other health conditions as told by your health care provider. This information is not intended to replace advice given to you by your health care provider. Make sure you discuss any questions you have with your health care provider. Document Revised: 06/19/2018 Document Reviewed: 06/19/2018 Elsevier Patient Education  2020 [employer]  

## 2019-08-28 DIAGNOSIS — I509 Heart failure, unspecified: Secondary | ICD-10-CM | POA: Diagnosis not present

## 2019-08-28 LAB — CBC WITH DIFFERENTIAL/PLATELET
Basophils Absolute: 0 10*3/uL (ref 0.0–0.2)
Basos: 0 %
EOS (ABSOLUTE): 0.2 10*3/uL (ref 0.0–0.4)
Eos: 2 %
Hematocrit: 35.4 % — ABNORMAL LOW (ref 37.5–51.0)
Hemoglobin: 10.9 g/dL — ABNORMAL LOW (ref 13.0–17.7)
Immature Grans (Abs): 0.1 10*3/uL (ref 0.0–0.1)
Immature Granulocytes: 1 %
Lymphocytes Absolute: 0.8 10*3/uL (ref 0.7–3.1)
Lymphs: 12 %
MCH: 29.4 pg (ref 26.6–33.0)
MCHC: 30.8 g/dL — ABNORMAL LOW (ref 31.5–35.7)
MCV: 95 fL (ref 79–97)
Monocytes Absolute: 0.5 10*3/uL (ref 0.1–0.9)
Monocytes: 7 %
Neutrophils Absolute: 5.3 10*3/uL (ref 1.4–7.0)
Neutrophils: 78 %
Platelets: 179 10*3/uL (ref 150–450)
RBC: 3.71 x10E6/uL — ABNORMAL LOW (ref 4.14–5.80)
RDW: 18 % — ABNORMAL HIGH (ref 11.6–15.4)
WBC: 6.8 10*3/uL (ref 3.4–10.8)

## 2019-08-28 LAB — BMP8+EGFR
BUN/Creatinine Ratio: 20 (ref 10–24)
BUN: 27 mg/dL (ref 8–27)
CO2: 28 mmol/L (ref 20–29)
Calcium: 9.4 mg/dL (ref 8.6–10.2)
Chloride: 91 mmol/L — ABNORMAL LOW (ref 96–106)
Creatinine, Ser: 1.36 mg/dL — ABNORMAL HIGH (ref 0.76–1.27)
GFR calc Af Amer: 57 mL/min/{1.73_m2} — ABNORMAL LOW (ref >59)
GFR calc non Af Amer: 49 mL/min/{1.73_m2} — ABNORMAL LOW (ref >59)
Glucose: 231 mg/dL — ABNORMAL HIGH (ref 65–99)
Potassium: 4.2 mmol/L (ref 3.5–5.2)
Sodium: 136 mmol/L (ref 134–144)

## 2019-08-30 DIAGNOSIS — Z934 Other artificial openings of gastrointestinal tract status: Secondary | ICD-10-CM | POA: Diagnosis not present

## 2019-08-30 DIAGNOSIS — J9611 Chronic respiratory failure with hypoxia: Secondary | ICD-10-CM | POA: Diagnosis not present

## 2019-08-30 DIAGNOSIS — E119 Type 2 diabetes mellitus without complications: Secondary | ICD-10-CM | POA: Diagnosis not present

## 2019-08-30 DIAGNOSIS — Z48812 Encounter for surgical aftercare following surgery on the circulatory system: Secondary | ICD-10-CM | POA: Diagnosis not present

## 2019-08-30 DIAGNOSIS — I2510 Atherosclerotic heart disease of native coronary artery without angina pectoris: Secondary | ICD-10-CM | POA: Diagnosis not present

## 2019-08-30 DIAGNOSIS — I4891 Unspecified atrial fibrillation: Secondary | ICD-10-CM | POA: Diagnosis not present

## 2019-08-30 DIAGNOSIS — I714 Abdominal aortic aneurysm, without rupture: Secondary | ICD-10-CM | POA: Diagnosis not present

## 2019-08-30 DIAGNOSIS — D649 Anemia, unspecified: Secondary | ICD-10-CM | POA: Diagnosis not present

## 2019-08-30 DIAGNOSIS — K819 Cholecystitis, unspecified: Secondary | ICD-10-CM | POA: Diagnosis not present

## 2019-08-30 DIAGNOSIS — Z950 Presence of cardiac pacemaker: Secondary | ICD-10-CM | POA: Diagnosis not present

## 2019-08-30 DIAGNOSIS — J449 Chronic obstructive pulmonary disease, unspecified: Secondary | ICD-10-CM | POA: Diagnosis not present

## 2019-08-30 DIAGNOSIS — I5030 Unspecified diastolic (congestive) heart failure: Secondary | ICD-10-CM | POA: Diagnosis not present

## 2019-08-30 DIAGNOSIS — M6281 Muscle weakness (generalized): Secondary | ICD-10-CM | POA: Diagnosis not present

## 2019-08-31 DIAGNOSIS — J9611 Chronic respiratory failure with hypoxia: Secondary | ICD-10-CM | POA: Diagnosis not present

## 2019-08-31 DIAGNOSIS — E119 Type 2 diabetes mellitus without complications: Secondary | ICD-10-CM | POA: Diagnosis not present

## 2019-08-31 DIAGNOSIS — I714 Abdominal aortic aneurysm, without rupture: Secondary | ICD-10-CM | POA: Diagnosis not present

## 2019-08-31 DIAGNOSIS — I5030 Unspecified diastolic (congestive) heart failure: Secondary | ICD-10-CM | POA: Diagnosis not present

## 2019-08-31 DIAGNOSIS — J449 Chronic obstructive pulmonary disease, unspecified: Secondary | ICD-10-CM | POA: Diagnosis not present

## 2019-08-31 DIAGNOSIS — Z48812 Encounter for surgical aftercare following surgery on the circulatory system: Secondary | ICD-10-CM | POA: Diagnosis not present

## 2019-08-31 DIAGNOSIS — K819 Cholecystitis, unspecified: Secondary | ICD-10-CM | POA: Diagnosis not present

## 2019-08-31 DIAGNOSIS — I2510 Atherosclerotic heart disease of native coronary artery without angina pectoris: Secondary | ICD-10-CM | POA: Diagnosis not present

## 2019-08-31 DIAGNOSIS — M6281 Muscle weakness (generalized): Secondary | ICD-10-CM | POA: Diagnosis not present

## 2019-08-31 DIAGNOSIS — I4891 Unspecified atrial fibrillation: Secondary | ICD-10-CM | POA: Diagnosis not present

## 2019-08-31 DIAGNOSIS — Z934 Other artificial openings of gastrointestinal tract status: Secondary | ICD-10-CM | POA: Diagnosis not present

## 2019-08-31 DIAGNOSIS — Z950 Presence of cardiac pacemaker: Secondary | ICD-10-CM | POA: Diagnosis not present

## 2019-08-31 DIAGNOSIS — D649 Anemia, unspecified: Secondary | ICD-10-CM | POA: Diagnosis not present

## 2019-09-01 DIAGNOSIS — J449 Chronic obstructive pulmonary disease, unspecified: Secondary | ICD-10-CM | POA: Diagnosis not present

## 2019-09-02 DIAGNOSIS — I714 Abdominal aortic aneurysm, without rupture: Secondary | ICD-10-CM | POA: Diagnosis not present

## 2019-09-02 DIAGNOSIS — I5030 Unspecified diastolic (congestive) heart failure: Secondary | ICD-10-CM | POA: Diagnosis not present

## 2019-09-02 DIAGNOSIS — Z48812 Encounter for surgical aftercare following surgery on the circulatory system: Secondary | ICD-10-CM | POA: Diagnosis not present

## 2019-09-02 DIAGNOSIS — J9611 Chronic respiratory failure with hypoxia: Secondary | ICD-10-CM | POA: Diagnosis not present

## 2019-09-02 DIAGNOSIS — J449 Chronic obstructive pulmonary disease, unspecified: Secondary | ICD-10-CM | POA: Diagnosis not present

## 2019-09-02 DIAGNOSIS — E119 Type 2 diabetes mellitus without complications: Secondary | ICD-10-CM | POA: Diagnosis not present

## 2019-09-02 DIAGNOSIS — I4891 Unspecified atrial fibrillation: Secondary | ICD-10-CM | POA: Diagnosis not present

## 2019-09-02 DIAGNOSIS — D649 Anemia, unspecified: Secondary | ICD-10-CM | POA: Diagnosis not present

## 2019-09-02 DIAGNOSIS — I2510 Atherosclerotic heart disease of native coronary artery without angina pectoris: Secondary | ICD-10-CM | POA: Diagnosis not present

## 2019-09-02 DIAGNOSIS — Z950 Presence of cardiac pacemaker: Secondary | ICD-10-CM | POA: Diagnosis not present

## 2019-09-02 DIAGNOSIS — M6281 Muscle weakness (generalized): Secondary | ICD-10-CM | POA: Diagnosis not present

## 2019-09-02 DIAGNOSIS — Z934 Other artificial openings of gastrointestinal tract status: Secondary | ICD-10-CM | POA: Diagnosis not present

## 2019-09-02 DIAGNOSIS — K819 Cholecystitis, unspecified: Secondary | ICD-10-CM | POA: Diagnosis not present

## 2019-09-03 DIAGNOSIS — E119 Type 2 diabetes mellitus without complications: Secondary | ICD-10-CM | POA: Diagnosis not present

## 2019-09-03 DIAGNOSIS — M6281 Muscle weakness (generalized): Secondary | ICD-10-CM | POA: Diagnosis not present

## 2019-09-03 DIAGNOSIS — Z934 Other artificial openings of gastrointestinal tract status: Secondary | ICD-10-CM | POA: Diagnosis not present

## 2019-09-03 DIAGNOSIS — I2510 Atherosclerotic heart disease of native coronary artery without angina pectoris: Secondary | ICD-10-CM | POA: Diagnosis not present

## 2019-09-03 DIAGNOSIS — I5030 Unspecified diastolic (congestive) heart failure: Secondary | ICD-10-CM | POA: Diagnosis not present

## 2019-09-03 DIAGNOSIS — D649 Anemia, unspecified: Secondary | ICD-10-CM | POA: Diagnosis not present

## 2019-09-03 DIAGNOSIS — J9611 Chronic respiratory failure with hypoxia: Secondary | ICD-10-CM | POA: Diagnosis not present

## 2019-09-03 DIAGNOSIS — J449 Chronic obstructive pulmonary disease, unspecified: Secondary | ICD-10-CM | POA: Diagnosis not present

## 2019-09-03 DIAGNOSIS — I714 Abdominal aortic aneurysm, without rupture: Secondary | ICD-10-CM | POA: Diagnosis not present

## 2019-09-03 DIAGNOSIS — Z950 Presence of cardiac pacemaker: Secondary | ICD-10-CM | POA: Diagnosis not present

## 2019-09-03 DIAGNOSIS — Z48812 Encounter for surgical aftercare following surgery on the circulatory system: Secondary | ICD-10-CM | POA: Diagnosis not present

## 2019-09-03 DIAGNOSIS — I4891 Unspecified atrial fibrillation: Secondary | ICD-10-CM | POA: Diagnosis not present

## 2019-09-03 DIAGNOSIS — K819 Cholecystitis, unspecified: Secondary | ICD-10-CM | POA: Diagnosis not present

## 2019-09-06 ENCOUNTER — Other Ambulatory Visit: Payer: Self-pay

## 2019-09-06 ENCOUNTER — Emergency Department (HOSPITAL_COMMUNITY): Payer: Medicare Other

## 2019-09-06 ENCOUNTER — Encounter (HOSPITAL_COMMUNITY): Payer: Self-pay | Admitting: Emergency Medicine

## 2019-09-06 ENCOUNTER — Inpatient Hospital Stay (HOSPITAL_COMMUNITY)
Admission: EM | Admit: 2019-09-06 | Discharge: 2019-09-08 | DRG: 193 | Disposition: A | Discharge status: Home / Self Care | Payer: Medicare Other | Attending: Internal Medicine | Admitting: Internal Medicine

## 2019-09-06 DIAGNOSIS — R0902 Hypoxemia: Secondary | ICD-10-CM | POA: Diagnosis not present

## 2019-09-06 DIAGNOSIS — Z950 Presence of cardiac pacemaker: Secondary | ICD-10-CM | POA: Diagnosis not present

## 2019-09-06 DIAGNOSIS — Z79899 Other long term (current) drug therapy: Secondary | ICD-10-CM

## 2019-09-06 DIAGNOSIS — R05 Cough: Secondary | ICD-10-CM | POA: Diagnosis not present

## 2019-09-06 DIAGNOSIS — D696 Thrombocytopenia, unspecified: Secondary | ICD-10-CM | POA: Diagnosis not present

## 2019-09-06 DIAGNOSIS — I10 Essential (primary) hypertension: Secondary | ICD-10-CM | POA: Diagnosis not present

## 2019-09-06 DIAGNOSIS — E669 Obesity, unspecified: Secondary | ICD-10-CM | POA: Diagnosis present

## 2019-09-06 DIAGNOSIS — Z885 Allergy status to narcotic agent status: Secondary | ICD-10-CM

## 2019-09-06 DIAGNOSIS — Z20822 Contact with and (suspected) exposure to covid-19: Secondary | ICD-10-CM | POA: Diagnosis not present

## 2019-09-06 DIAGNOSIS — Z8679 Personal history of other diseases of the circulatory system: Secondary | ICD-10-CM

## 2019-09-06 DIAGNOSIS — E039 Hypothyroidism, unspecified: Secondary | ICD-10-CM | POA: Diagnosis present

## 2019-09-06 DIAGNOSIS — Z6827 Body mass index (BMI) 27.0-27.9, adult: Secondary | ICD-10-CM

## 2019-09-06 DIAGNOSIS — F419 Anxiety disorder, unspecified: Secondary | ICD-10-CM | POA: Diagnosis present

## 2019-09-06 DIAGNOSIS — I350 Nonrheumatic aortic (valve) stenosis: Secondary | ICD-10-CM | POA: Diagnosis not present

## 2019-09-06 DIAGNOSIS — E8770 Fluid overload, unspecified: Secondary | ICD-10-CM | POA: Diagnosis not present

## 2019-09-06 DIAGNOSIS — D638 Anemia in other chronic diseases classified elsewhere: Secondary | ICD-10-CM | POA: Diagnosis not present

## 2019-09-06 DIAGNOSIS — D649 Anemia, unspecified: Secondary | ICD-10-CM | POA: Diagnosis present

## 2019-09-06 DIAGNOSIS — Z79890 Hormone replacement therapy (postmenopausal): Secondary | ICD-10-CM

## 2019-09-06 DIAGNOSIS — E1122 Type 2 diabetes mellitus with diabetic chronic kidney disease: Secondary | ICD-10-CM | POA: Diagnosis not present

## 2019-09-06 DIAGNOSIS — Z952 Presence of prosthetic heart valve: Secondary | ICD-10-CM

## 2019-09-06 DIAGNOSIS — G4733 Obstructive sleep apnea (adult) (pediatric): Secondary | ICD-10-CM | POA: Diagnosis present

## 2019-09-06 DIAGNOSIS — R5381 Other malaise: Secondary | ICD-10-CM | POA: Diagnosis not present

## 2019-09-06 DIAGNOSIS — I2510 Atherosclerotic heart disease of native coronary artery without angina pectoris: Secondary | ICD-10-CM | POA: Diagnosis present

## 2019-09-06 DIAGNOSIS — I480 Paroxysmal atrial fibrillation: Secondary | ICD-10-CM | POA: Diagnosis not present

## 2019-09-06 DIAGNOSIS — J440 Chronic obstructive pulmonary disease with acute lower respiratory infection: Secondary | ICD-10-CM | POA: Diagnosis not present

## 2019-09-06 DIAGNOSIS — F329 Major depressive disorder, single episode, unspecified: Secondary | ICD-10-CM | POA: Diagnosis present

## 2019-09-06 DIAGNOSIS — N183 Chronic kidney disease, stage 3 unspecified: Secondary | ICD-10-CM | POA: Diagnosis present

## 2019-09-06 DIAGNOSIS — J9621 Acute and chronic respiratory failure with hypoxia: Secondary | ICD-10-CM | POA: Diagnosis present

## 2019-09-06 DIAGNOSIS — J189 Pneumonia, unspecified organism: Principal | ICD-10-CM | POA: Diagnosis present

## 2019-09-06 DIAGNOSIS — Z7951 Long term (current) use of inhaled steroids: Secondary | ICD-10-CM

## 2019-09-06 DIAGNOSIS — Z833 Family history of diabetes mellitus: Secondary | ICD-10-CM

## 2019-09-06 DIAGNOSIS — I130 Hypertensive heart and chronic kidney disease with heart failure and stage 1 through stage 4 chronic kidney disease, or unspecified chronic kidney disease: Secondary | ICD-10-CM | POA: Diagnosis present

## 2019-09-06 DIAGNOSIS — K219 Gastro-esophageal reflux disease without esophagitis: Secondary | ICD-10-CM | POA: Diagnosis present

## 2019-09-06 DIAGNOSIS — E785 Hyperlipidemia, unspecified: Secondary | ICD-10-CM | POA: Diagnosis not present

## 2019-09-06 DIAGNOSIS — Z888 Allergy status to other drugs, medicaments and biological substances status: Secondary | ICD-10-CM

## 2019-09-06 DIAGNOSIS — R000 Tachycardia, unspecified: Secondary | ICD-10-CM | POA: Diagnosis not present

## 2019-09-06 DIAGNOSIS — I252 Old myocardial infarction: Secondary | ICD-10-CM

## 2019-09-06 DIAGNOSIS — N1831 Chronic kidney disease, stage 3a: Secondary | ICD-10-CM | POA: Diagnosis present

## 2019-09-06 DIAGNOSIS — E119 Type 2 diabetes mellitus without complications: Secondary | ICD-10-CM

## 2019-09-06 DIAGNOSIS — Z8249 Family history of ischemic heart disease and other diseases of the circulatory system: Secondary | ICD-10-CM

## 2019-09-06 DIAGNOSIS — E1159 Type 2 diabetes mellitus with other circulatory complications: Secondary | ICD-10-CM | POA: Diagnosis present

## 2019-09-06 DIAGNOSIS — E1151 Type 2 diabetes mellitus with diabetic peripheral angiopathy without gangrene: Secondary | ICD-10-CM | POA: Diagnosis not present

## 2019-09-06 DIAGNOSIS — J449 Chronic obstructive pulmonary disease, unspecified: Secondary | ICD-10-CM | POA: Diagnosis not present

## 2019-09-06 DIAGNOSIS — I5033 Acute on chronic diastolic (congestive) heart failure: Secondary | ICD-10-CM | POA: Diagnosis present

## 2019-09-06 DIAGNOSIS — J9601 Acute respiratory failure with hypoxia: Secondary | ICD-10-CM

## 2019-09-06 DIAGNOSIS — Z87891 Personal history of nicotine dependence: Secondary | ICD-10-CM

## 2019-09-06 DIAGNOSIS — I959 Hypotension, unspecified: Secondary | ICD-10-CM | POA: Diagnosis not present

## 2019-09-06 DIAGNOSIS — Z955 Presence of coronary angioplasty implant and graft: Secondary | ICD-10-CM

## 2019-09-06 DIAGNOSIS — R0602 Shortness of breath: Secondary | ICD-10-CM | POA: Diagnosis not present

## 2019-09-06 DIAGNOSIS — Z7982 Long term (current) use of aspirin: Secondary | ICD-10-CM

## 2019-09-06 DIAGNOSIS — Z91040 Latex allergy status: Secondary | ICD-10-CM

## 2019-09-06 HISTORY — DX: Presence of cardiac pacemaker: Z95.0

## 2019-09-06 LAB — I-STAT CHEM 8, ED
BUN: 26 mg/dL — ABNORMAL HIGH (ref 8–23)
Calcium, Ion: 1.09 mmol/L — ABNORMAL LOW (ref 1.15–1.40)
Chloride: 89 mmol/L — ABNORMAL LOW (ref 98–111)
Creatinine, Ser: 1.4 mg/dL — ABNORMAL HIGH (ref 0.61–1.24)
Glucose, Bld: 164 mg/dL — ABNORMAL HIGH (ref 70–99)
HCT: 30 % — ABNORMAL LOW (ref 39.0–52.0)
Hemoglobin: 10.2 g/dL — ABNORMAL LOW (ref 13.0–17.0)
Potassium: 3.7 mmol/L (ref 3.5–5.1)
Sodium: 134 mmol/L — ABNORMAL LOW (ref 135–145)
TCO2: 33 mmol/L — ABNORMAL HIGH (ref 22–32)

## 2019-09-06 LAB — CBC
HCT: 29.6 % — ABNORMAL LOW (ref 39.0–52.0)
Hemoglobin: 9.1 g/dL — ABNORMAL LOW (ref 13.0–17.0)
MCH: 30.2 pg (ref 26.0–34.0)
MCHC: 30.7 g/dL (ref 30.0–36.0)
MCV: 98.3 fL (ref 80.0–100.0)
Platelets: 123 10*3/uL — ABNORMAL LOW (ref 150–400)
RBC: 3.01 MIL/uL — ABNORMAL LOW (ref 4.22–5.81)
RDW: 19.6 % — ABNORMAL HIGH (ref 11.5–15.5)
WBC: 4.5 10*3/uL (ref 4.0–10.5)
nRBC: 0 % (ref 0.0–0.2)

## 2019-09-06 LAB — LACTIC ACID, PLASMA: Lactic Acid, Venous: 1.1 mmol/L (ref 0.5–1.9)

## 2019-09-06 MED ORDER — SODIUM CHLORIDE 0.9 % IV SOLN
2.0000 g | Freq: Once | INTRAVENOUS | Status: AC
  Administered 2019-09-07: 2 g via INTRAVENOUS
  Filled 2019-09-06: qty 20

## 2019-09-06 MED ORDER — SODIUM CHLORIDE 0.9 % IV SOLN
500.0000 mg | INTRAVENOUS | Status: DC
  Administered 2019-09-07: 500 mg via INTRAVENOUS
  Filled 2019-09-06: qty 500

## 2019-09-06 NOTE — ED Provider Notes (Signed)
[facility] Emergency Department Provider Note MRN:  [id number]  Arrival date & time: 09/07/19     Chief Complaint   Respiratory Distress   History of Present Illness   Matthew Campos is a 79 y.o. year-old male with a history of AAA, CHF, COPD, CAD, diabetes presenting to the ED with chief complaint of respiratory distress.  Coughing spells and trouble breathing today, EMS was called.  Patient denies any chest pain.  Was febrile today at 101.  Satting in the 70s upon EMS arrival, improved to the upper 80s with CPAP in route.  Review of Systems  A complete 10 system review of systems was obtained and all systems are negative except as noted in the HPI and PMH.   Patient's Health History    Past Medical History:  Diagnosis Date  . AAA (abdominal aortic aneurysm) without rupture ([place])    repaired  . Abdominal aortic aneurysm without rupture ([place])   . Anemia   . Anxiety and depression   . Aortic stenosis   . 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[place])   . CHF (congestive heart failure) ([place])   . Cholecystitis   . Chronic ischemic heart disease   . Complication of anesthesia    hard to be put to sleep  . COPD (chronic obstructive pulmonary disease) ([place])   . Coronary artery disease   . Diabetes mellitus without complication ([place])   . Diabetes mellitus without complication ([place])    takes Januvia and Metformin daily  . GERD (gastroesophageal reflux disease)   . GERD (gastroesophageal reflux disease)    takes omeprazole daily  . Headache(784.0)    sinus  . History of bronchitis    last time >34yrs ago  . Hyperlipidemia    takes Lipitor daily  . Hypertension   . Hypertension    takes Metoprolol daily  . Joint pain   . Myocardial infarction ([place])    x 3;last one about 3-64yrs ago  . Neoplasm of uncertain behavior of bladder   . Obesity   . OSA (obstructive sleep apnea)   . Pacemaker   . Pancytopenia ([place])   . Paroxysmal atrial fibrillation (HCC)   .  Pneumonia    last itme about 26yrs ago  . S/P TAVR (transcatheter aortic valve replacement)   . Severe aortic stenosis   . Sinus bradycardia     Past Surgical History:  Procedure Laterality Date  . abdominal aneurysm stenting    . ABDOMINAL AORTIC ANEURYSM REPAIR     per patient stents placed about 4-5 years ago  . AORTIC VALVE REPLACEMENT     stated it was done in November  . cholecystostomy tube    . CORONARY ANGIOPLASTY  2010  . CORONARY ANGIOPLASTY WITH STENT PLACEMENT     about 30 years ago per patient  . cyst removed from left wrist    . ESOPHAGOGASTRODUODENOSCOPY (EGD) WITH PROPOFOL N/A 04/15/2019   Procedure: ESOPHAGOGASTRODUODENOSCOPY (EGD) WITH PROPOFOL;  Surgeon: Danie Binder, MD;  [facility];  Service: Endoscopy;  Laterality: N/A;  possible dilation  . HERNIA REPAIR    . left shoulder surgery    . POLYPECTOMY  04/15/2019   Procedure: POLYPECTOMY;  Surgeon: Danie Binder, MD;  Location: [facility];  Service: Endoscopy;;  duodenal   . SHOULDER ARTHROSCOPY WITH ROTATOR CUFF REPAIR AND SUBACROMIAL DECOMPRESSION  04/17/2012   Procedure: SHOULDER ARTHROSCOPY WITH ROTATOR CUFF REPAIR AND SUBACROMIAL DECOMPRESSION;  Surgeon: Yvette Rack., MD;  Location: [facility];  Service: Orthopedics;  Laterality: Right;  RIGHT SHOULDER ROTATOR CUFF REPAIR INCLUDING ACROMIOPLASTY CHRONIC, ARTHROSCOPY SHOULDER DEBRIDEMENT EXTENSIVE  . TONSILLECTOMY    . VALVE REPLACEMENT      Family History  Problem Relation Age of Onset  . Cancer Mother        Unknown type  . Heart disease Father   . Diabetes Father   . Heart disease Sister   . Seizures Brother   . Diabetes Daughter   . Diabetes Daughter   . Heart attack Son   . Colon cancer Mother        in her 36s.   . Stomach cancer Neg Hx   . Esophageal cancer Neg Hx     Social History   Socioeconomic History  . Marital status: Married    Spouse name: Not on file  . Number of children: Not on file  . Years of education:  Not on file  . Highest education level: Not on file  Occupational History  . Not on file  Tobacco Use  . Smoking status: Former Research scientist (life sciences)  . Smokeless tobacco: Never Used  . Tobacco comment: quit smoking 20+yrs ago  Substance and Sexual Activity  . Alcohol use: No    Comment: last drank about 2-3 years ago  . Drug use: No  . Sexual activity: Yes  Other Topics Concern  . Not on file  Social History Narrative   ** Merged History Encounter **       Social Determinants of Health   Financial Resource Strain: Low Risk   . Difficulty of Paying Living Expenses: Not very hard  Food Insecurity: No Food Insecurity  . Worried About Charity fundraiser in the Last Year: Never true  . Ran Out of Food in the Last Year: Never true  Transportation Needs: Unmet Transportation Needs  . Lack of Transportation (Medical): No  . Lack of Transportation (Non-Medical): Yes  Physical Activity: Inactive  . Days of Exercise per Week: 0 days  . Minutes of Exercise per Session: 0 min  Stress: Stress Concern Present  . Feeling of Stress : To some extent  Social Connections: Somewhat Isolated  . Frequency of Communication with Friends and Family: Twice a week  . Frequency of Social Gatherings with Friends and Family: Twice a week  . Attends Religious Services: Never  . Active Member of Clubs or Organizations: No  . Attends Archivist Meetings: Never  . Marital Status: Married  Human resources officer Violence: Not At Risk  . Fear of Current or Ex-Partner: No  . Emotionally Abused: No  . Physically Abused: No  . Sexually Abused: No     Physical Exam   Vitals:   09/06/19 2246 09/06/19 2300  BP: 107/62 (!) 105/58  Pulse: 69 67  Resp: 15 18  Temp: 99.5 F (37.5 C)   SpO2: 96% 95%    CONSTITUTIONAL: Chronically ill-appearing, NAD NEURO:  Alert and oriented x 3, no focal deficits EYES:  eyes equal and reactive ENT/NECK:  no LAD, no JVD CARDIO: Regular rate, well-perfused, normal S1 and  S2 PULM:  CTAB no wheezing or rhonchi GI/GU:  normal bowel sounds, non-distended, non-tender MSK/SPINE:  No gross deformities, no edema SKIN:  no rash, atraumatic PSYCH:  Appropriate speech and behavior  *Additional and/or pertinent findings included in MDM below  Diagnostic and Interventional Summary    EKG Interpretation  Date/Time:  Monday Sep 06 2019 22:45:21 EDT Ventricular Rate:  71 PR Interval:  QRS Duration: 163 QT Interval:  451 QTC Calculation: 491 R Axis:   -89 Text Interpretation: paced rhythm Nonspecific IVCD with LAD Left ventricular hypertrophy Anterolateral infarct, old Confirmed by Gerlene Fee [phone]) on 09/06/2019 11:02:24 PM      Labs Reviewed  CBC - Abnormal; Notable for the following components:      Result Value   RBC 3.01 (*)    Hemoglobin 9.1 (*)    HCT 29.6 (*)    RDW 19.6 (*)    Platelets 123 (*)    All other components within normal limits  I-STAT CHEM 8, ED - Abnormal; Notable for the following components:   Sodium 134 (*)    Chloride 89 (*)    BUN 26 (*)    Creatinine, Ser 1.40 (*)    Glucose, Bld 164 (*)    Calcium, Ion 1.09 (*)    TCO2 33 (*)    Hemoglobin 10.2 (*)    HCT 30.0 (*)    All other components within normal limits  CULTURE, BLOOD (SINGLE)  SARS CORONAVIRUS 2 BY RT PCR (HOSPITAL ORDER, [facility])  LACTIC ACID, PLASMA  COMPREHENSIVE METABOLIC PANEL  BRAIN NATRIURETIC PEPTIDE  TROPONIN I (HIGH SENSITIVITY)    DG Chest Port 1 View  Final Result      Medications  cefTRIAXone (ROCEPHIN) 2 g in sodium chloride 0.9 % 100 mL IVPB (has no administration in time range)  azithromycin (ZITHROMAX) 500 mg in sodium chloride 0.9 % 250 mL IVPB (has no administration in time range)     Procedures  /  Critical Care .Critical Care Performed by: Maudie Flakes, MD Authorized by: Maudie Flakes, MD   Critical care provider statement:    Critical care time (minutes):  32   Critical care was  necessary to treat or prevent imminent or life-threatening deterioration of the following conditions:  Respiratory failure   Critical care was time spent personally by me on the following activities:  Discussions with consultants, evaluation of patient's response to treatment, examination of patient, ordering and performing treatments and interventions, ordering and review of laboratory studies, ordering and review of radiographic studies, pulse oximetry, re-evaluation of patient's condition, obtaining history from patient or surrogate and review of old charts    ED Course and Medical Decision Making  I have reviewed the triage vital signs, the nursing notes, and pertinent available records from the EMR.  Listed above are laboratory and imaging tests that I personally ordered, reviewed, and interpreted and then considered in my medical decision making (see below for details).      Hypoxic respiratory failure, unclear of COPD versus CHF exacerbation versus infectious process given the fever at home.  Work-up is pending.  Chest x-ray with retrocardiac opacity, clinically consistent with concern for pneumonia.  Will admit to hospitalist service.  Barth Kirks. Sedonia Small, [place] [email or web address]  Final Clinical Impressions(s) / ED Diagnoses     ICD-10-CM   1. Acute respiratory failure with hypoxia (HCC)  J96.01   2. Community acquired pneumonia, unspecified laterality  J18.9     ED Discharge Orders    None       Discharge Instructions Discussed with and Provided to Patient:   Discharge Instructions   None       Maudie Flakes, MD 09/07/19 0003

## 2019-09-06 NOTE — ED Triage Notes (Addendum)
EMS called out for persistent cough and weakness. When EMS arrived, pt was 74% on 3L [place]. Pt was placed on CPAP and transported here via emergency traffic. Pt was also noted to have a fever at home of 101.6.

## 2019-09-07 DIAGNOSIS — I252 Old myocardial infarction: Secondary | ICD-10-CM | POA: Diagnosis not present

## 2019-09-07 DIAGNOSIS — F419 Anxiety disorder, unspecified: Secondary | ICD-10-CM | POA: Diagnosis present

## 2019-09-07 DIAGNOSIS — F329 Major depressive disorder, single episode, unspecified: Secondary | ICD-10-CM | POA: Diagnosis present

## 2019-09-07 DIAGNOSIS — Z950 Presence of cardiac pacemaker: Secondary | ICD-10-CM | POA: Diagnosis not present

## 2019-09-07 DIAGNOSIS — E669 Obesity, unspecified: Secondary | ICD-10-CM | POA: Diagnosis present

## 2019-09-07 DIAGNOSIS — Z952 Presence of prosthetic heart valve: Secondary | ICD-10-CM | POA: Diagnosis not present

## 2019-09-07 DIAGNOSIS — J189 Pneumonia, unspecified organism: Secondary | ICD-10-CM | POA: Diagnosis not present

## 2019-09-07 DIAGNOSIS — I480 Paroxysmal atrial fibrillation: Secondary | ICD-10-CM | POA: Diagnosis not present

## 2019-09-07 DIAGNOSIS — E8770 Fluid overload, unspecified: Secondary | ICD-10-CM

## 2019-09-07 DIAGNOSIS — J9621 Acute and chronic respiratory failure with hypoxia: Secondary | ICD-10-CM | POA: Diagnosis present

## 2019-09-07 DIAGNOSIS — E1151 Type 2 diabetes mellitus with diabetic peripheral angiopathy without gangrene: Secondary | ICD-10-CM | POA: Diagnosis present

## 2019-09-07 DIAGNOSIS — N183 Chronic kidney disease, stage 3 unspecified: Secondary | ICD-10-CM | POA: Diagnosis present

## 2019-09-07 DIAGNOSIS — Z20822 Contact with and (suspected) exposure to covid-19: Secondary | ICD-10-CM | POA: Diagnosis present

## 2019-09-07 DIAGNOSIS — I10 Essential (primary) hypertension: Secondary | ICD-10-CM | POA: Diagnosis not present

## 2019-09-07 DIAGNOSIS — I5033 Acute on chronic diastolic (congestive) heart failure: Secondary | ICD-10-CM | POA: Diagnosis present

## 2019-09-07 DIAGNOSIS — F322 Major depressive disorder, single episode, severe without psychotic features: Secondary | ICD-10-CM | POA: Diagnosis not present

## 2019-09-07 DIAGNOSIS — E785 Hyperlipidemia, unspecified: Secondary | ICD-10-CM | POA: Diagnosis present

## 2019-09-07 DIAGNOSIS — D638 Anemia in other chronic diseases classified elsewhere: Secondary | ICD-10-CM | POA: Diagnosis present

## 2019-09-07 DIAGNOSIS — I350 Nonrheumatic aortic (valve) stenosis: Secondary | ICD-10-CM | POA: Diagnosis present

## 2019-09-07 DIAGNOSIS — J9601 Acute respiratory failure with hypoxia: Secondary | ICD-10-CM | POA: Diagnosis not present

## 2019-09-07 DIAGNOSIS — I130 Hypertensive heart and chronic kidney disease with heart failure and stage 1 through stage 4 chronic kidney disease, or unspecified chronic kidney disease: Secondary | ICD-10-CM | POA: Diagnosis present

## 2019-09-07 DIAGNOSIS — I2510 Atherosclerotic heart disease of native coronary artery without angina pectoris: Secondary | ICD-10-CM | POA: Diagnosis present

## 2019-09-07 DIAGNOSIS — J440 Chronic obstructive pulmonary disease with acute lower respiratory infection: Secondary | ICD-10-CM | POA: Diagnosis present

## 2019-09-07 DIAGNOSIS — E782 Mixed hyperlipidemia: Secondary | ICD-10-CM | POA: Diagnosis not present

## 2019-09-07 DIAGNOSIS — J449 Chronic obstructive pulmonary disease, unspecified: Secondary | ICD-10-CM | POA: Diagnosis not present

## 2019-09-07 DIAGNOSIS — E1122 Type 2 diabetes mellitus with diabetic chronic kidney disease: Secondary | ICD-10-CM | POA: Diagnosis present

## 2019-09-07 DIAGNOSIS — G4733 Obstructive sleep apnea (adult) (pediatric): Secondary | ICD-10-CM | POA: Diagnosis present

## 2019-09-07 DIAGNOSIS — E039 Hypothyroidism, unspecified: Secondary | ICD-10-CM | POA: Diagnosis present

## 2019-09-07 DIAGNOSIS — E119 Type 2 diabetes mellitus without complications: Secondary | ICD-10-CM | POA: Diagnosis not present

## 2019-09-07 DIAGNOSIS — D696 Thrombocytopenia, unspecified: Secondary | ICD-10-CM | POA: Diagnosis present

## 2019-09-07 DIAGNOSIS — K219 Gastro-esophageal reflux disease without esophagitis: Secondary | ICD-10-CM | POA: Diagnosis present

## 2019-09-07 LAB — COMPREHENSIVE METABOLIC PANEL
ALT: 12 U/L (ref 0–44)
ALT: 10 U/L (ref 0–44)
AST: 16 U/L (ref 15–41)
AST: 15 U/L (ref 15–41)
Albumin: 3.4 g/dL — ABNORMAL LOW (ref 3.5–5.0)
Albumin: 3.1 g/dL — ABNORMAL LOW (ref 3.5–5.0)
Alkaline Phosphatase: 56 U/L (ref 38–126)
Alkaline Phosphatase: 53 U/L (ref 38–126)
Anion gap: 12 (ref 5–15)
Anion gap: 13 (ref 5–15)
BUN: 28 mg/dL — ABNORMAL HIGH (ref 8–23)
BUN: 26 mg/dL — ABNORMAL HIGH (ref 8–23)
CO2: 31 mmol/L (ref 22–32)
CO2: 32 mmol/L (ref 22–32)
Calcium: 8.3 mg/dL — ABNORMAL LOW (ref 8.9–10.3)
Calcium: 8.3 mg/dL — ABNORMAL LOW (ref 8.9–10.3)
Chloride: 88 mmol/L — ABNORMAL LOW (ref 98–111)
Chloride: 90 mmol/L — ABNORMAL LOW (ref 98–111)
Creatinine, Ser: 1.36 mg/dL — ABNORMAL HIGH (ref 0.61–1.24)
Creatinine, Ser: 1.25 mg/dL — ABNORMAL HIGH (ref 0.61–1.24)
GFR calc Af Amer: 57 mL/min — ABNORMAL LOW (ref >60)
GFR calc Af Amer: >60 mL/min (ref >60)
GFR calc non Af Amer: 49 mL/min — ABNORMAL LOW (ref >60)
GFR calc non Af Amer: 54 mL/min — ABNORMAL LOW (ref >60)
Glucose, Bld: 165 mg/dL — ABNORMAL HIGH (ref 70–99)
Glucose, Bld: 168 mg/dL — ABNORMAL HIGH (ref 70–99)
Potassium: 3.6 mmol/L (ref 3.5–5.1)
Potassium: 3.5 mmol/L (ref 3.5–5.1)
Sodium: 131 mmol/L — ABNORMAL LOW (ref 135–145)
Sodium: 135 mmol/L (ref 135–145)
Total Bilirubin: 1 mg/dL (ref 0.3–1.2)
Total Bilirubin: 0.7 mg/dL (ref 0.3–1.2)
Total Protein: 7 g/dL (ref 6.5–8.1)
Total Protein: 6.5 g/dL (ref 6.5–8.1)

## 2019-09-07 LAB — MAGNESIUM: Magnesium: 1.6 mg/dL — ABNORMAL LOW (ref 1.7–2.4)

## 2019-09-07 LAB — CBC
HCT: 29.6 % — ABNORMAL LOW (ref 39.0–52.0)
Hemoglobin: 9 g/dL — ABNORMAL LOW (ref 13.0–17.0)
MCH: 29.8 pg (ref 26.0–34.0)
MCHC: 30.4 g/dL (ref 30.0–36.0)
MCV: 98 fL (ref 80.0–100.0)
Platelets: 116 10*3/uL — ABNORMAL LOW (ref 150–400)
RBC: 3.02 MIL/uL — ABNORMAL LOW (ref 4.22–5.81)
RDW: 19.6 % — ABNORMAL HIGH (ref 11.5–15.5)
WBC: 4 10*3/uL (ref 4.0–10.5)
nRBC: 0 % (ref 0.0–0.2)

## 2019-09-07 LAB — TROPONIN I (HIGH SENSITIVITY)
Troponin I (High Sensitivity): 26 ng/L — ABNORMAL HIGH (ref <18)
Troponin I (High Sensitivity): 26 ng/L — ABNORMAL HIGH (ref <18)

## 2019-09-07 LAB — STREP PNEUMONIAE URINARY ANTIGEN: Strep Pneumo Urinary Antigen: NEGATIVE

## 2019-09-07 LAB — SARS CORONAVIRUS 2 BY RT PCR (HOSPITAL ORDER, PERFORMED IN ~~LOC~~ HOSPITAL LAB): SARS Coronavirus 2: NEGATIVE

## 2019-09-07 LAB — BRAIN NATRIURETIC PEPTIDE: B Natriuretic Peptide: 747 pg/mL — ABNORMAL HIGH (ref 0.0–100.0)

## 2019-09-07 LAB — PHOSPHORUS: Phosphorus: 3.2 mg/dL (ref 2.5–4.6)

## 2019-09-07 LAB — PROCALCITONIN: Procalcitonin: <0.1 ng/mL

## 2019-09-07 MED ORDER — ASPIRIN EC 81 MG PO TBEC
81.0000 mg | DELAYED_RELEASE_TABLET | Freq: Every day | ORAL | Status: DC
  Administered 2019-09-07 – 2019-09-08 (×2): 81 mg via ORAL
  Filled 2019-09-07 (×2): qty 1

## 2019-09-07 MED ORDER — ACETAMINOPHEN 650 MG RE SUPP: 650.0000 mg | Freq: Four times a day (QID) | RECTAL | Status: DC | PRN

## 2019-09-07 MED ORDER — POTASSIUM CHLORIDE CRYS ER 20 MEQ PO TBCR
20.0000 meq | EXTENDED_RELEASE_TABLET | Freq: Once | ORAL | Status: AC
  Administered 2019-09-07: 20 meq via ORAL
  Filled 2019-09-07: qty 1

## 2019-09-07 MED ORDER — ESCITALOPRAM OXALATE 10 MG PO TABS
10.0000 mg | ORAL_TABLET | Freq: Every day | ORAL | Status: DC
  Administered 2019-09-07 – 2019-09-08 (×2): 10 mg via ORAL
  Filled 2019-09-07 (×2): qty 1

## 2019-09-07 MED ORDER — ACETAMINOPHEN 325 MG PO TABS
650.0000 mg | ORAL_TABLET | Freq: Four times a day (QID) | ORAL | Status: DC | PRN
  Administered 2019-09-07: 650 mg via ORAL
  Filled 2019-09-07: qty 2

## 2019-09-07 MED ORDER — ALBUTEROL SULFATE (2.5 MG/3ML) 0.083% IN NEBU: 2.5000 mg | INHALATION_SOLUTION | Freq: Four times a day (QID) | RESPIRATORY_TRACT | Status: DC | PRN

## 2019-09-07 MED ORDER — FERROUS SULFATE 325 (65 FE) MG PO TABS
325.0000 mg | ORAL_TABLET | ORAL | Status: DC
  Administered 2019-09-08: 325 mg via ORAL
  Filled 2019-09-07: qty 1

## 2019-09-07 MED ORDER — TORSEMIDE 20 MG PO TABS
40.0000 mg | ORAL_TABLET | Freq: Two times a day (BID) | ORAL | Status: DC
  Administered 2019-09-07 – 2019-09-08 (×3): 40 mg via ORAL
  Filled 2019-09-07 (×5): qty 2

## 2019-09-07 MED ORDER — ONDANSETRON HCL 4 MG PO TABS: 4.0000 mg | ORAL_TABLET | Freq: Four times a day (QID) | ORAL | Status: DC | PRN

## 2019-09-07 MED ORDER — FUROSEMIDE 10 MG/ML IJ SOLN
40.0000 mg | Freq: Once | INTRAMUSCULAR | Status: AC
  Administered 2019-09-07: 40 mg via INTRAVENOUS
  Filled 2019-09-07: qty 4

## 2019-09-07 MED ORDER — ENOXAPARIN SODIUM 40 MG/0.4ML ~~LOC~~ SOLN
40.0000 mg | SUBCUTANEOUS | Status: DC
  Administered 2019-09-07 – 2019-09-08 (×2): 40 mg via SUBCUTANEOUS
  Filled 2019-09-07 (×2): qty 0.4

## 2019-09-07 MED ORDER — IPRATROPIUM-ALBUTEROL 0.5-2.5 (3) MG/3ML IN SOLN
3.0000 mL | Freq: Two times a day (BID) | RESPIRATORY_TRACT | Status: DC
  Administered 2019-09-07 – 2019-09-08 (×2): 3 mL via RESPIRATORY_TRACT
  Filled 2019-09-07 (×2): qty 3

## 2019-09-07 MED ORDER — ROSUVASTATIN CALCIUM 10 MG PO TABS
10.0000 mg | ORAL_TABLET | Freq: Every day | ORAL | Status: DC
  Administered 2019-09-07 – 2019-09-08 (×2): 10 mg via ORAL
  Filled 2019-09-07 (×3): qty 1

## 2019-09-07 MED ORDER — SPIRONOLACTONE 25 MG PO TABS
25.0000 mg | ORAL_TABLET | Freq: Every day | ORAL | Status: DC
  Administered 2019-09-07 – 2019-09-08 (×2): 25 mg via ORAL
  Filled 2019-09-07 (×3): qty 1

## 2019-09-07 MED ORDER — SODIUM CHLORIDE 0.9 % IV SOLN
2.0000 g | INTRAVENOUS | Status: DC
  Administered 2019-09-07 – 2019-09-08 (×2): 2 g via INTRAVENOUS
  Filled 2019-09-07 (×2): qty 20

## 2019-09-07 MED ORDER — SODIUM CHLORIDE 0.9 % IV SOLN: 2.0000 g | INTRAVENOUS | Status: DC

## 2019-09-07 MED ORDER — MAGNESIUM SULFATE 2 GM/50ML IV SOLN
2.0000 g | Freq: Once | INTRAVENOUS | Status: AC
  Administered 2019-09-07: 2 g via INTRAVENOUS
  Filled 2019-09-07: qty 50

## 2019-09-07 MED ORDER — ONDANSETRON HCL 4 MG/2ML IJ SOLN
4.0000 mg | Freq: Four times a day (QID) | INTRAMUSCULAR | Status: DC | PRN
  Administered 2019-09-08: 4 mg via INTRAVENOUS
  Filled 2019-09-07: qty 2

## 2019-09-07 MED ORDER — SODIUM CHLORIDE 0.9 % IV SOLN
500.0000 mg | INTRAVENOUS | Status: DC
  Administered 2019-09-07 – 2019-09-08 (×2): 500 mg via INTRAVENOUS
  Filled 2019-09-07 (×2): qty 500

## 2019-09-07 MED ORDER — LUBIPROSTONE 24 MCG PO CAPS
24.0000 ug | ORAL_CAPSULE | Freq: Two times a day (BID) | ORAL | Status: DC
  Administered 2019-09-07 – 2019-09-08 (×3): 24 ug via ORAL
  Filled 2019-09-07 (×4): qty 1

## 2019-09-07 MED ORDER — TRAZODONE HCL 50 MG PO TABS
50.0000 mg | ORAL_TABLET | Freq: Every day | ORAL | Status: DC
  Administered 2019-09-07: 50 mg via ORAL
  Filled 2019-09-07: qty 1

## 2019-09-07 MED ORDER — LINAGLIPTIN 5 MG PO TABS
5.0000 mg | ORAL_TABLET | Freq: Every day | ORAL | Status: DC
  Administered 2019-09-07 – 2019-09-08 (×2): 5 mg via ORAL
  Filled 2019-09-07 (×3): qty 1

## 2019-09-07 MED ORDER — LEVOTHYROXINE SODIUM 100 MCG PO TABS
100.0000 ug | ORAL_TABLET | Freq: Every day | ORAL | Status: DC
  Administered 2019-09-07 – 2019-09-08 (×2): 100 ug via ORAL
  Filled 2019-09-07: qty 2
  Filled 2019-09-07: qty 1

## 2019-09-07 NOTE — ED Notes (Signed)
Dr Loleta Books in to see pt, wife at bedside.

## 2019-09-07 NOTE — H&P (Signed)
History and Physical    Matthew Campos QJF:[id number] DOB: 1940/12/20 DOA: 09/06/2019  PCP: [name], DO   Patient coming from: Home.  I have personally briefly reviewed patient's old medical records in [place]  Chief Complaint: Shortness of breath.  HPI: Matthew Campos is a 79 y.o. male with medical history significant of AAA with AAA repair, chronic normocytic anemia, anxiety and depression, aortic stenosis, history of TAVR, carotid artery disease, CAD, chronic ischemic heart disease, COPD, type 2 diabetes, GERD, headaches, hyperlipidemia, hypertension, OSA, paroxysmal atrial fibrillation, sinus bradycardia, history of pacemaker placement who underwent cholecystostomy earlier this month at [facility] and is coming to the emergency department due to progressively worsening dyspnea associated with productive cough of clear sputum that has not waking him up at night and some lower extremities edema.  He had a fever of 101.6 F at home earlier.  He mentions that he has gained about 9 pounds since he was discharged from [facility]. He denies dietary indiscretions with fluid and sodium intake.  He denies chest pain, palpitations, dizziness, diaphoresis, nausea, emesis, fever, chills, rhinorrhea, sore throat, wheezing or hemoptysis.  No significant recent abdominal pain, diarrhea, constipation, melena or hematochezia.  No dysuria, frequency hematuria.  No polyuria, polydipsia, polyphagia or blurred vision.  ED Course: Initial vital signs were temperature 99.5 F, pulse 69, respirations 15, blood pressure 107/62 mmHg and O2 sat 78% on room air and 96% on 4-1/2 L/min via nasal cannula.  The patient was given supplemental oxygen, ceftriaxone 2 g IVPB and azithromycin 500 mg IVPB.  I added furosemide 40 mg IVP and earlier ordered magnesium sulfate 2 g IVPB due to hypomagnesemia.  CBC showed a white count of 4.5, hemoglobin 9.1 g/dL and platelets 123.  Troponin was 26  ng/L x 2.  BNP was 747.0 pg milliliter.  Lactic acid was normal.  Sodium 136, potassium 3.6, chloride 88 CO2 31 mmol/L.  LFTs are normal except for an albumin on 3.4 g/dL.  Glucose 165, magnesium 1.6, BUN 28 and creatinine 1.36 mg/dL.  Procalcitonin was less than 0.10 ng/mL.  Review of Systems: As per HPI otherwise all other systems reviewed and are negative.  Past Medical History:  Diagnosis Date  . AAA (abdominal aortic aneurysm) without rupture ([place])    repaired  . Abdominal aortic aneurysm without rupture ([place])   . Anemia   . Anxiety and depression   . Aortic stenosis   . 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[place])   . CHF (congestive heart failure) ([place])   . Cholecystitis   . Chronic ischemic heart disease   . Complication of anesthesia    hard to be put to sleep  . COPD (chronic obstructive pulmonary disease) ([place])   . Coronary artery disease   . Diabetes mellitus without complication ([place])   . Diabetes mellitus without complication ([place])    takes Januvia and Metformin daily  . GERD (gastroesophageal reflux disease)   . GERD (gastroesophageal reflux disease)    takes omeprazole daily  . Headache(784.0)    sinus  . History of bronchitis    last time >52yr ago  . Hyperlipidemia    takes Lipitor daily  . Hypertension   . Hypertension    takes Metoprolol daily  . Joint pain   . Myocardial infarction ([place]    x 3;last one about 3-482yrago  . Neoplasm of uncertain behavior of bladder   . Obesity   . OSA (obstructive sleep apnea)   .  Pacemaker   . Pancytopenia ([place])   . Paroxysmal atrial fibrillation (HCC)   . Pneumonia    last itme about 33yr ago  . S/P TAVR (transcatheter aortic valve replacement)   . Severe aortic stenosis   . Sinus bradycardia     Past Surgical History:  Procedure Laterality Date  . abdominal aneurysm stenting    . ABDOMINAL AORTIC ANEURYSM REPAIR     per patient stents placed about 4-5 years ago  . AORTIC VALVE REPLACEMENT     stated it was done in  November  . cholecystostomy tube    . CORONARY ANGIOPLASTY  2010  . CORONARY ANGIOPLASTY WITH STENT PLACEMENT     about 30 years ago per patient  . cyst removed from left wrist    . ESOPHAGOGASTRODUODENOSCOPY (EGD) WITH PROPOFOL N/A 04/15/2019   Procedure: ESOPHAGOGASTRODUODENOSCOPY (EGD) WITH PROPOFOL;  Surgeon: FDanie Binder MD;  Location[facility];  Service: Endoscopy;  Laterality: N/A;  possible dilation  . HERNIA REPAIR    . left shoulder surgery    . POLYPECTOMY  04/15/2019   Procedure: POLYPECTOMY;  Surgeon: FDanie Binder MD;  Location: [facility];  Service: Endoscopy;;  duodenal   . SHOULDER ARTHROSCOPY WITH ROTATOR CUFF REPAIR AND SUBACROMIAL DECOMPRESSION  04/17/2012   Procedure: SHOULDER ARTHROSCOPY WITH ROTATOR CUFF REPAIR AND SUBACROMIAL DECOMPRESSION;  Surgeon: WYvette Rack, MD;  [place]  Service: Orthopedics;  Laterality: Right;  RIGHT SHOULDER ROTATOR CUFF REPAIR INCLUDING ACROMIOPLASTY CHRONIC, ARTHROSCOPY SHOULDER DEBRIDEMENT EXTENSIVE  . TONSILLECTOMY    . VALVE REPLACEMENT      Social History  reports that he has quit smoking. He has never used smokeless tobacco. He reports that he does not drink alcohol or use drugs.  Allergies  Allergen Reactions  . Codeine Nausea And Vomiting  . Codeine   . Latex Hives and Itching  . Lisinopril   . Lisinopril Cough    Family History  Problem Relation Age of Onset  . Cancer Mother        Unknown type  . Heart disease Father   . Diabetes Father   . Heart disease Sister   . Seizures Brother   . Diabetes Daughter   . Diabetes Daughter   . Heart attack Son   . Colon cancer Mother        in her 952s   . Stomach cancer Neg Hx   . Esophageal cancer Neg Hx    Prior to Admission medications   Medication Sig Start Date End Date Taking? Authorizing Provider  acetaZOLAMIDE (DIAMOX) 250 MG tablet Take by mouth. 05/14/19 06/13/19  [provider]  albuterol (PROVENTIL) (2.5 MG/3ML) 0.083%  nebulizer solution Take 2.5 mg by nebulization every 6 (six) hours as needed for wheezing or shortness of breath.    [provider]  albuterol (VENTOLIN HFA) 108 (90 Base) MCG/ACT inhaler Inhale 2 puffs into the lungs every 6 (six) hours as needed for wheezing or shortness of breath. 10/28/18   JLoman Brooklyn FNP  aspirin EC 81 MG tablet Take 81 mg by mouth daily.    [provider]  Blood Glucose Monitoring Suppl (ONETOUCH VERIO) w/Device KIT TID 05/25/18   [provider]  calcium carbonate (OSCAL) 1500 (600 Ca) MG TABS tablet Take by mouth 2 (two) times daily with a meal.    [provider]  escitalopram (LEXAPRO) 10 MG tablet TAKE ONE (1) TABLET EACH DAY 08/06/19   GRonnie DossMRavenna  DO  fenoprofen (NALFON) 600 MG TABS tablet Take 600 mg by mouth.    [provider]  ferrous sulfate 325 (65 FE) MG EC tablet Take by mouth. 06/18/19   [provider]  glucose blood (ONETOUCH VERIO) test strip TID Dx E11.9 08/26/18   [provider]  JANUVIA 50 MG tablet TAKE ONE (1) TABLET EACH DAY 08/25/19   Ronnie Doss M, DO  levothyroxine (SYNTHROID) 100 MCG tablet Take 1 tablet (100 mcg total) by mouth daily. 08/18/19   [name], DO  LORazepam (ATIVAN) 0.5 MG tablet Take 1 tablet by mouth at bedtime as needed. 02/01/19   [provider]  lubiprostone (AMITIZA) 24 MCG capsule Take 1 capsule (24 mcg total) by mouth 2 (two) times daily with a meal. For constipation 07/23/19   [name], DO  Multiple Vitamin (MULTIVITAMIN WITH MINERALS) TABS Take 1 tablet by mouth daily.    [provider]  nitroGLYCERIN (NITROSTAT) 0.4 MG SL tablet Place 0.4 mg under the tongue every 5 (five) minutes as needed. For chest pain    [provider]  ondansetron (ZOFRAN-ODT) 4 MG disintegrating tablet TAKE 1 TABLET EVERY 8 HOURS AS NEEDED FOR NAUSEA AND VOMITING 08/18/19   Ronnie Doss M, DO  pantoprazole (PROTONIX) 40 MG  tablet Take 1 tablet (40 mg total) by mouth daily. 05/25/19   [name], DO  Propylene Glycol (SYSTANE BALANCE) 0.6 % SOLN Apply 1 drop to eye daily as needed (1 GTT OU QD PRN FOR ALLERGIES.).    [provider]  rosuvastatin (CRESTOR) 10 MG tablet Take 10 mg by mouth daily. 02/26/19   [provider]  spironolactone (ALDACTONE) 25 MG tablet Take by mouth. 05/15/19 08/13/19  [provider]  SYMBICORT 80-4.5 MCG/ACT inhaler Inhale 2 puffs into the lungs 2 (two) times daily as needed. 10/28/18   [provider]  torsemide (DEMADEX) 20 MG tablet Take 2 tablets (40 mg total) by mouth 2 (two) times daily. 08/27/19   Sharion Balloon, FNP  traZODone (DESYREL) 50 MG tablet Take 0.5-1 tablets (25-50 mg total) by mouth at bedtime as needed for sleep. 07/23/19   [name], DO    Physical Exam: Vitals:   09/06/19 2246 09/06/19 2250 09/06/19 2300 09/07/19 0102  BP: 107/62  (!) 105/58 102/61  Pulse: 69  67 72  Resp: 15  18   Temp: 99.5 F (37.5 C)     TempSrc: Oral     SpO2: 96%  95% 98%  Weight:  82.1 kg    Height:  _0  (1.727 m)      Constitutional: NAD, calm, comfortable Eyes: PERRL, lids and conjunctivae normal ENMT: Nasal cannula in place.  Mucous membranes are moist. Posterior pharynx clear of any exudate or lesions. Neck: normal, supple, no masses, no thyromegaly Respiratory: Bibasilar crackles.  No wheezing, no rhonchi.  Normal respiratory effort. No accessory muscle use.  Cardiovascular: Regular rate and rhythm, no murmurs / rubs / gallops.  2+ lower extremity pitting edema. 2+ pedal pulses. No carotid bruits.  Abdomen: Obese, nondistended, cholecystostomy in place.  BS positive.  Soft, no tenderness, no masses palpated. No hepatosplenomegaly. Musculoskeletal: no clubbing / cyanosis. Good ROM, no contractures. Normal muscle tone.  Skin: no rashes, lesions, ulcers on very limited dermatological examination. Neurologic: CN 2-12 grossly  intact. Sensation intact, DTR normal. Strength 5/5 in all 4.  Psychiatric: Normal judgment and insight. Alert and oriented x 3. Normal mood.   Labs  on Admission: I have personally reviewed following labs and imaging studies  CBC: Recent Labs  Lab 09/06/19 2313 09/06/19 2326  WBC 4.5  --   HGB 9.1* 10.2*  HCT 29.6* 30.0*  MCV 98.3  --   PLT 123*  --     Basic Metabolic Panel: Recent Labs  Lab 09/06/19 2313 09/06/19 2326  NA 131* 134*  K 3.6 3.7  CL 88* 89*  CO2 31  --   GLUCOSE 165* 164*  BUN 28* 26*  CREATININE 1.36* 1.40*  CALCIUM 8.3*  --   MG 1.6*  --   PHOS 3.2  --     GFR: Estimated Creatinine Clearance: 44.7 mL/min (A) (by C-G formula based on SCr of 1.4 mg/dL (H)).  Liver Function Tests: Recent Labs  Lab 09/06/19 2313  AST 16  ALT 12  ALKPHOS 56  BILITOT 1.0  PROT 7.0  ALBUMIN 3.4*    Urine analysis:    Component Value Date/Time   APPEARANCEUR Clear 03/04/2019 1548   GLUCOSEU Negative 03/04/2019 1548   BILIRUBINUR Negative 03/04/2019 1548   PROTEINUR Trace (A) 03/04/2019 1548   NITRITE Negative 03/04/2019 1548   LEUKOCYTESUR Negative 03/04/2019 1548    Radiological Exams on Admission: DG Chest Port 1 View  Result Date: 09/06/2019 CLINICAL DATA:  Shortness of breath. Respiratory distress. EXAM: PORTABLE CHEST 1 VIEW COMPARISON:  Most recent radiograph 04/13/2019. Most recent CT 01/04/2019 FINDINGS: Stable cardiomegaly with prior TAVR. Loop recorder in the left chest wall. Aortic atherosclerosis. Vascular congestion. Suggestion of pulmonary edema with mild Kerley B-lines. Possible small pleural effusions. Ill-defined retrocardiac opacity with additional patchy opacities in the mid lung zones. No pneumothorax. IMPRESSION: 1. Stable cardiomegaly. Vascular congestion with possible small pleural effusions. Suggestion of mild pulmonary edema. 2. Ill-defined retrocardiac opacity with additional patchy opacities in the mid lung zones. This may represent  pulmonary edema or superimposed pneumonia. Electronically Signed   By: Keith Rake M.D.   On: 09/06/2019 23:14   08/11/2019 Echo (Care Everywhere tab)  Name: JASEAN, AMBROSIA [facility]         Study Date: 08/11/2019  Height: 44 in MRN: [id number]                  Patient Location: [id number]  Weight: 194 lb DOB: 18-Feb-1941                Gender: Male       BSA: 2.0 m2 Age: 38 yrs                  Ethnicity: W       BP: 127/65 mmHg Reason For Study: Dyspnea; Chronic ischemic heart disease        HR: 58 Ordering Physician: Curly Rim     Performed By: Merrie Roof  Referring Physician: Nolen Mu  PROCEDURE Image Quality: Technically adequate. - SUMMARY The left ventricular size is normal. Left ventricular systolic function is low normal. LV ejection fraction = 50-55%. The right ventricle is mildly dilated. The right ventricular systolic function is mildly reduced. The right atrium is mildly dilated. Status post TAVR with Edwards Sapien prosthesis with mild paravalvular leak. There is mild to moderate mitral regurgitation. There is mild tricuspid regurgitation. Estimated right ventricular systolic pressure is 04.5 mmHg. Mild pulmonary hypertension. The aortic sinus is normal size. IVC size was moderately dilated. There is trivial pericardial effusion. Compared to the last study dated 04/30/2019, pulmonary hypertension improved.  EKG: Independently reviewed.  Vent. rate 71  BPM PR interval * ms QRS duration 163 ms QT/QTc 451/491 ms P-R-T axes * -89 86 paced rhythm Nonspecific IVCD with LAD Left ventricular hypertrophy Anterolateral infarct, old  Assessment/Plan Principal Problem:   Volume overload  Observation/telemetry. Continue supplemental oxygen. Fluid and sodium restriction. Start furosemide 40 mg IVP daily. Continue spironolactone 25 mg p.o. daily. Monitor daily  weights, intake and output. Monitor renal function electrolytes.  Active Problems:   COPD (chronic obstructive pulmonary disease) (HCC)  Continue supplemental oxygen. Continue Symbicort 2 puffs twice daily. Nebulized bronchodilators as needed.    CAD (coronary artery disease) Continue aspirin and rosuvastatin.    Gastroesophageal reflux disease Continue Protonix 40 mg p.o. daily.    Hyperlipidemia On rosuvastatin 10 mg p.o. daily.    Essential hypertension On spironolactone 25 mg plus torsemide 20 daily. Monitor blood pressure, renal function electrolytes.    Type 2 diabetes mellitus (HCC) Carbohydrate modified diet. Continue Januvia or formulary equivalent. CBG monitoring with RI SS.    Normocytic anemia Monitor H&H.    Paroxysmal atrial fibrillation (HCC) Currently has paced rhythm. Not on anticoagulation.    OSA (obstructive sleep apnea)  Not on CPAP.    Hypomagnesemia Replace with mag sulfate 2 g IVPB. Follow-up magnesium level as needed. May need daily replacement.    CKD (chronic kidney disease), stage III Monitor renal function electrolytes.    DVT prophylaxis: Lovenox SQ. Code Status:   Full code. Family Communication:   Disposition Plan:   Patient is from:  Home.  Anticipated DC to:  Home.  Anticipated DC date:  09/09/2019.  Anticipated DC barriers: Clinical improvement.  Consults called: Admission status:  Inpatient/telemetry.  Severity of Illness:  Moderate severity.  Reubin Milan MD [facility]  How to contact the [facility] Attending or Consulting provider [place] or covering provider during after hours [place], for this patient?   1. Check the care team in [facility] and look for a) attending/consulting [facility] provider listed and b) the [facility] team listed 2. Log into [email or web address] and use [place] universal password to access. If you do not have the password, please contact the hospital operator. 3. Locate the [facility] provider you are looking  for under [facility] and page to a number that you can be directly reached. 4. If you still have difficulty reaching the provider, please page the [facility] (Director on Call) for the Hospitalists listed on amion for assistance.  09/07/2019, 1:29 AM   This document was prepared using Dragon voice recognition software and may contain some unintended transcription errors.

## 2019-09-07 NOTE — Progress Notes (Signed)
PROGRESS NOTE    Matthew Campos  [id number] DOB: [birth year] DOA: 09/06/2019 PCP: [name], DO      Brief Narrative:  Matthew Campos is a 79 y.o. M with hx dCHF, hx TAVR, PVD s/p AAA repair, chronic ischemic heart disease, COPD on 3 L home oxygen, DM, GERD, HTN, PAF, bradycardia with recent defibrillator/pacer placement at [facility] who presented with 2 days of fever, malaise, cough.  Patient admitted to [facility] 1 month ago, diuresed 17 L, discharged on torsemide 40 twice daily.  She has been doing well at home for a couple weeks, and even gone out to [place] the lawn several days ago.  Then 2 days prior to admission he was "cruddy".  Then developed a fever and was coughing.  When EMS were called out to house they found his SpO2 mid-70s on home 3L.  In the ER, still saturating 70s on 3L.  WBC normal and afebrile here, but CXR with edema and new retrocardiac opacity.  Started on antibiotics and Diuretics and admitted to hospitalist service.       Assessment & Plan:  Community-acquired pneumonia, left lower lobe causing acute on chronic hypoxic respiratory failure No heavy antibiotic load during last hospitalization, doubt Pseudomonas. -Continue ceftriaxone and azithromycin   Acute on chronic diastolic CHF Got IV Lasix overnight.  Weaned back to home O2, doesn't appear particularly overloaded. -Resume home torsemide -Continue spironolactone  Peripheral vascular disease, secondary prevention Coronary disease secondary prevention Hypertension -Continue aspirin Crestor  Aortic stenosis status post TAVR  Paroxysmal atrial fibrillation status post pacemaker implantation This was diagnosed at [facility] last month, seen by EP, not discharged on anticoagulant.  Unclear why not. -Follow up with EP  COPD No evidence of flare -Hold home inhaled steroid -Albuterol as needed  Hypothyroidism -Continue levothyroxine  GERD -Hold PPI  Diabetes -Continue Januvia as  formulary alternative -Start SSI corrections  Anemia of Chronic disease Hgb stable, no clinical bleeding  Thrombocytopenia Mild, no bleeding      Disposition: Status is: Inpatient  Remains inpatient appropriate because:PSI 119, risk class IV, 8-10% mortality, continued IV therapy is warranted   Dispo: The patient is from: Home              Anticipated d/c is to: TBD              Anticipated d/c date is: 2 days              Patient currently is not medically stable to d/c.              MDM: This is a no charge note.  For further details, please see H&P by my partner Dr. Olevia Bowens from earlier today.  The below labs and imaging reports were reviewed and summarized above.    DVT prophylaxis: Lovenox Code Status: FULL Family Communication: Wife at bedside    Consultants:     Procedures:     Antimicrobials:   Ceftriaxone and azithromycin 5/25 >>   Culture data:   Blood culture x1 -- pending           Subjective: Patient feeling tired, weak.  No confusion.          Objective: Vitals:   09/07/19 1030 09/07/19 1100 09/07/19 1200 09/07/19 1230  BP: (!) 92/55 97/62 (!) 107/58 112/62  Pulse: 70 71 69   Resp: 11 13 19 14   Temp:      TempSrc:      SpO2: 98% 99%  98%   Weight:      Height:        Intake/Output Summary (Last 24 hours) at 09/07/2019 1235 Last data filed at 09/07/2019 1033 Gross per 24 hour  Intake --  Output 50 ml  Net -50 ml   Filed Weights   09/06/19 2250  Weight: 82.1 kg    Examination: The patient was seen and examined.      Data Reviewed: I have personally reviewed following labs and imaging studies:  CBC: Recent Labs  Lab 09/06/19 2313 09/06/19 2326 09/07/19 0705  WBC 4.5  --  4.0  HGB 9.1* 10.2* 9.0*  HCT 29.6* 30.0* 29.6*  MCV 98.3  --  98.0  PLT 123*  --  99991111*   Basic Metabolic Panel: Recent Labs  Lab 09/06/19 2313 09/06/19 2326 09/07/19 0705  NA 131* 134* 135  K 3.6 3.7 3.5  CL 88*  89* 90*  CO2 31  --  32  GLUCOSE 165* 164* 168*  BUN 28* 26* 26*  CREATININE 1.36* 1.40* 1.25*  CALCIUM 8.3*  --  8.3*  MG 1.6*  --   --   PHOS 3.2  --   --    GFR: Estimated Creatinine Clearance: 50.1 mL/min (A) (by C-G formula based on SCr of 1.25 mg/dL (H)). Liver Function Tests: Recent Labs  Lab 09/06/19 2313 09/07/19 0705  AST 16 15  ALT 12 10  ALKPHOS 56 53  BILITOT 1.0 0.7  PROT 7.0 6.5  ALBUMIN 3.4* 3.1*   No results for input(s): LIPASE, AMYLASE in the last 168 hours. No results for input(s): AMMONIA in the last 168 hours. Coagulation Profile: No results for input(s): INR, PROTIME in the last 168 hours. Cardiac Enzymes: No results for input(s): CKTOTAL, CKMB, CKMBINDEX, TROPONINI in the last 168 hours. BNP (last 3 results) No results for input(s): PROBNP in the last 8760 hours. HbA1C: No results for input(s): HGBA1C in the last 72 hours. CBG: No results for input(s): GLUCAP in the last 168 hours. Lipid Profile: No results for input(s): CHOL, HDL, LDLCALC, TRIG, CHOLHDL, LDLDIRECT in the last 72 hours. Thyroid Function Tests: No results for input(s): TSH, T4TOTAL, FREET4, T3FREE, THYROIDAB in the last 72 hours. Anemia Panel: No results for input(s): VITAMINB12, FOLATE, FERRITIN, TIBC, IRON, RETICCTPCT in the last 72 hours. Urine analysis:    Component Value Date/Time   APPEARANCEUR Clear 03/04/2019 1548   GLUCOSEU Negative 03/04/2019 1548   BILIRUBINUR Negative 03/04/2019 1548   PROTEINUR Trace (A) 03/04/2019 1548   NITRITE Negative 03/04/2019 1548   LEUKOCYTESUR Negative 03/04/2019 1548   Sepsis Labs: @LABRCNTIP (procalcitonin:4,lacticacidven:4)  ) Recent Results (from the past 240 hour(s))  SARS Coronavirus 2 by RT PCR (hospital order, performed in [facility] hospital lab) Nasopharyngeal Nasopharyngeal Swab     Status: None   Collection Time: 09/06/19 10:53 PM   Specimen: Nasopharyngeal Swab  Result Value Ref Range Status   SARS Coronavirus 2  NEGATIVE NEGATIVE Final    Comment: (NOTE) SARS-CoV-2 target nucleic acids are NOT DETECTED. The SARS-CoV-2 RNA is generally detectable in upper and lower respiratory specimens during the acute phase of infection. The lowest concentration of SARS-CoV-2 viral copies this assay can detect is 250 copies / mL. A negative result does not preclude SARS-CoV-2 infection and should not be used as the sole basis for treatment or other patient management decisions.  A negative result may occur with improper specimen collection / handling, submission of specimen other than nasopharyngeal swab, presence of viral mutation(s)  within the areas targeted by this assay, and inadequate number of viral copies (<250 copies / mL). A negative result must be combined with clinical observations, patient history, and epidemiological information. Fact Sheet for Patients:   StrictlyIdeas.no Fact Sheet for Healthcare Providers: BankingDealers.co.za This test is not yet approved or cleared  by the [place] FDA and has been authorized for detection and/or diagnosis of SARS-CoV-2 by FDA under an Emergency Use Authorization (EUA).  This EUA will remain in effect (meaning this test can be used) for the duration of the COVID-19 declaration under Section 564(b)(1) of the Act, 21 U.S.C. section 360bbb-3(b)(1), unless the authorization is terminated or revoked sooner. Performed at [facility], [address] [place], [place] [address]   Culture, blood (single) w Reflex to ID Panel     Status: None (Preliminary result)   Collection Time: 09/06/19 11:13 PM   Specimen: BLOOD  Result Value Ref Range Status   Specimen Description BLOOD RIGHT ANTECUBITAL  Final   Special Requests   Final    BOTTLES DRAWN AEROBIC AND ANAEROBIC Blood Culture adequate volume   Culture   Final    NO GROWTH < 12 HOURS Performed at [facility], [address], [place], [place] [address]     Report Status PENDING  Incomplete         Radiology Studies: DG Chest Port 1 View  Result Date: 09/06/2019 CLINICAL DATA:  Shortness of breath. Respiratory distress. EXAM: PORTABLE CHEST 1 VIEW COMPARISON:  Most recent radiograph 04/13/2019. Most recent CT 01/04/2019 FINDINGS: Stable cardiomegaly with prior TAVR. Loop recorder in the left chest wall. Aortic atherosclerosis. Vascular congestion. Suggestion of pulmonary edema with mild Kerley B-lines. Possible small pleural effusions. Ill-defined retrocardiac opacity with additional patchy opacities in the mid lung zones. No pneumothorax. IMPRESSION: 1. Stable cardiomegaly. Vascular congestion with possible small pleural effusions. Suggestion of mild pulmonary edema. 2. Ill-defined retrocardiac opacity with additional patchy opacities in the mid lung zones. This may represent pulmonary edema or superimposed pneumonia. Electronically Signed   By: Keith Rake M.D.   On: 09/06/2019 23:14        Scheduled Meds: . aspirin EC  81 mg Oral Daily  . enoxaparin (LOVENOX) injection  40 mg Subcutaneous Q24H  . escitalopram  10 mg Oral Daily  . [START ON 09/08/2019] ferrous sulfate  325 mg Oral Q M,W,F  . levothyroxine  100 mcg Oral Daily  . linagliptin  5 mg Oral Daily  . lubiprostone  24 mcg Oral BID WC  . rosuvastatin  10 mg Oral Daily  . spironolactone  25 mg Oral Daily  . torsemide  40 mg Oral BID  . traZODone  50 mg Oral QHS   Continuous Infusions: . azithromycin    . cefTRIAXone (ROCEPHIN)  IV       LOS: 0 days    Time spent: 20 minutes    Edwin Dada, MD [facility] 09/07/2019, 12:35 PM     Please page though [place] or Epic secure chat:  For password, contact charge nurse

## 2019-09-07 NOTE — ED Notes (Signed)
Placed on hospital bed

## 2019-09-07 NOTE — ED Notes (Signed)
Text sent to pharmacy for remainder of meds.

## 2019-09-08 ENCOUNTER — Ambulatory Visit (INDEPENDENT_AMBULATORY_CARE_PROVIDER_SITE_OTHER): Payer: Medicare Other | Admitting: Licensed Clinical Social Worker

## 2019-09-08 DIAGNOSIS — E782 Mixed hyperlipidemia: Secondary | ICD-10-CM

## 2019-09-08 DIAGNOSIS — I10 Essential (primary) hypertension: Secondary | ICD-10-CM

## 2019-09-08 DIAGNOSIS — F322 Major depressive disorder, single episode, severe without psychotic features: Secondary | ICD-10-CM

## 2019-09-08 DIAGNOSIS — J449 Chronic obstructive pulmonary disease, unspecified: Secondary | ICD-10-CM | POA: Diagnosis not present

## 2019-09-08 DIAGNOSIS — I480 Paroxysmal atrial fibrillation: Secondary | ICD-10-CM

## 2019-09-08 DIAGNOSIS — K219 Gastro-esophageal reflux disease without esophagitis: Secondary | ICD-10-CM

## 2019-09-08 DIAGNOSIS — E119 Type 2 diabetes mellitus without complications: Secondary | ICD-10-CM | POA: Diagnosis not present

## 2019-09-08 DIAGNOSIS — J9601 Acute respiratory failure with hypoxia: Secondary | ICD-10-CM

## 2019-09-08 LAB — HIV ANTIBODY (ROUTINE TESTING W REFLEX): HIV Screen 4th Generation wRfx: NONREACTIVE

## 2019-09-08 LAB — PROCALCITONIN: Procalcitonin: <0.1 ng/mL

## 2019-09-08 MED ORDER — AMOXICILLIN-POT CLAVULANATE 875-125 MG PO TABS
1.0000 | ORAL_TABLET | Freq: Two times a day (BID) | ORAL | 0 refills | Status: AC
Start: 2019-09-08 — End: 2019-09-13

## 2019-09-08 MED ORDER — AZITHROMYCIN 500 MG PO TABS
500.0000 mg | ORAL_TABLET | Freq: Every day | ORAL | 0 refills | Status: AC
Start: 2019-09-08 — End: 2019-09-13

## 2019-09-08 NOTE — Evaluation (Signed)
Physical Therapy Evaluation Patient Details Name: Matthew Campos MRN: [id number] DOB: 12-19-1940 Today's Date: 09/08/2019   History of Present Illness  Matthew Campos is a 79 y.o. male with medical history significant of AAA with AAA repair, chronic normocytic anemia, anxiety and depression, aortic stenosis, history of TAVR, carotid artery disease, CAD, chronic ischemic heart disease, COPD, type 2 diabetes, GERD, headaches, hyperlipidemia, hypertension, OSA, paroxysmal atrial fibrillation, sinus bradycardia, history of pacemaker placement who underwent cholecystostomy earlier this month at [facility] and is coming to the emergency department due to progressively worsening dyspnea associated with productive cough of clear sputum that has not waking him up at night and some lower extremities edema.  He had a fever of 101.6 F at home earlier.  He mentions that he has gained about 9 pounds since he was discharged from [facility]. He denies dietary indiscretions with fluid and sodium intake.  He denies chest pain, palpitations, dizziness, diaphoresis, nausea, emesis, fever, chills, rhinorrhea, sore throat, wheezing or hemoptysis.  No significant recent abdominal pain, diarrhea, constipation, melena or hematochezia.  No dysuria, frequency hematuria.  No polyuria, polydipsia, polyphagia or blurred vision.    Clinical Impression  Patient functioning at baseline for functional mobility and gait, slightly unsteady during ambulation with occasional leaning on nearby objects for support, usually uses and cane and encouraged to use his cane at home and ask for assistance as needed with understanding acknowledged.  Plan:  Patient discharged from physical therapy to care of nursing for ambulation daily as tolerated for length of stay.    Follow Up Recommendations Home health PT;Supervision for mobility/OOB;Supervision - Intermittent    Equipment Recommendations  None recommended by PT     Recommendations for Other Services       Precautions / Restrictions Precautions Precautions: Fall Restrictions Weight Bearing Restrictions: No      Mobility  Bed Mobility Overal bed mobility: Modified Independent                Transfers Overall transfer level: Modified independent                  Ambulation/Gait Ambulation/Gait assistance: Supervision Gait Distance (Feet): 75 Feet Assistive device: None Gait Pattern/deviations: Decreased step length - right;Decreased step length - left;Decreased stride length Gait velocity: decreased   General Gait Details: slightly labored cadence with occasional leaning on nearby objects for support, at times has to slow cadence to maintain standing balance, limited secondary to fatigue, on 2 LPM with SpO2 at 84%  Stairs            Wheelchair Mobility    Modified Rankin (Stroke Patients Only)       Balance Overall balance assessment: Needs assistance Sitting-balance support: Feet supported;No upper extremity supported Sitting balance-Leahy Scale: Good Sitting balance - Comments: seated at EOB   Standing balance support: During functional activity;No upper extremity supported Standing balance-Leahy Scale: Fair Standing balance comment: without AD, occasionally has to lean on nearby objects for support when walking                             Pertinent Vitals/Pain Pain Assessment: Faces Faces Pain Scale: Hurts a little bit Pain Location: left flank Pain Descriptors / Indicators: Sore Pain Intervention(s): Limited activity within patient's tolerance;Monitored during session    Home Living Family/patient expects to be discharged to:: Private residence   Available Help at Discharge: Family;Available 24 hours/day Type of Home: House  Home Access: Ramped entrance     Home Layout: Two level;Able to live on main level with bedroom/bathroom;Full bath on main level Home Equipment: Walker - 2  wheels;Walker - 4 wheels;Cane - quad;Cane - single point;Bedside commode;Hospital bed;Wheelchair - Brewing technologist      Prior Function Level of Independence: Needs assistance   Gait / Transfers Assistance Needed: Household ambulator using [facility]  ADL's / Homemaking Assistance Needed: assisted by family        Hand Dominance        Extremity/Trunk Assessment   Upper Extremity Assessment Upper Extremity Assessment: Overall WFL for tasks assessed    Lower Extremity Assessment Lower Extremity Assessment: Overall WFL for tasks assessed    Cervical / Trunk Assessment Cervical / Trunk Assessment: Normal  Communication   Communication: No difficulties  Cognition Arousal/Alertness: Awake/alert Behavior During Therapy: WFL for tasks assessed/performed Overall Cognitive Status: Within Functional Limits for tasks assessed                                        General Comments      Exercises     Assessment/Plan    PT Assessment Patient needs continued PT services;All further PT needs can be met in the next venue of care  PT Problem List Decreased strength;Decreased activity tolerance;Decreased balance;Decreased mobility       PT Treatment Interventions      PT Goals (Current goals can be found in the Care Plan section)  Acute Rehab PT Goals Patient Stated Goal: return home with family to assist PT Goal Formulation: With patient Time For Goal Achievement: 09/08/19 Potential to Achieve Goals: Good    Frequency     Barriers to discharge        Co-evaluation               AM-PAC PT "6 Clicks" Mobility  Outcome Measure Help needed turning from your back to your side while in a flat bed without using bedrails?: None Help needed moving from lying on your back to sitting on the side of a flat bed without using bedrails?: None Help needed moving to and from a bed to a chair (including a wheelchair)?: None Help needed standing up from a chair  using your arms (e.g., wheelchair or bedside chair)?: None Help needed to walk in hospital room?: A Little Help needed climbing 3-5 steps with a railing? : A Little 6 Click Score: 22    End of Session Equipment Utilized During Treatment: Oxygen Activity Tolerance: Patient tolerated treatment well;Patient limited by fatigue Patient left: in bed;with call bell/phone within reach Nurse Communication: Mobility status PT Visit Diagnosis: Unsteadiness on feet (R26.81);Other abnormalities of gait and mobility (R26.89);Muscle weakness (generalized) (M62.81)    Time: 8841-6606 PT Time Calculation (min) (ACUTE ONLY): 21 min   Charges:   PT Evaluation $PT Eval Moderate Complexity: 1 Mod PT Treatments $Therapeutic Activity: 8-22 mins        2:37 PM, 09/08/19 Lonell Grandchild, MPT Physical Therapist with [facility] [phone] office [phone] mobile phone

## 2019-09-08 NOTE — Chronic Care Management (AMB) (Signed)
Chronic Care Management    Clinical Social Work Follow Up Note  09/08/2019 Name: SEBERT ADDLEMAN MRN: [id number] DOB: 12-07-1940  TELLER BALGOBIN is a 79 y.o. year old male who is a primary care patient of [name], DO. The CCM team was consulted for assistance with [employer] .   Review of patient status, including review of consultants reports, other relevant assessments, and collaboration with appropriate care team members and the patient's provider was performed as part of comprehensive patient evaluation and provision of chronic care management services.    SDOH (Social Determinants of Health) assessments performed: Yes;risk for social isolation; risk for tobacco use; risk for stress; risk for physical inactivity. Risk of transport needs      Office Visit from 05/25/2019 in [place]  PHQ-9 Total Score  0     GAD 7 : Generalized Anxiety Score 05/13/2019 10/28/2018  Nervous, Anxious, on Edge 1 3  Control/stop worrying 1 3  Worry too much - different things 1 3  Trouble relaxing 1 0  Restless 1 3  Easily annoyed or irritable 0 3  Afraid - awful might happen 0 0  Total GAD 7 Score 5 15  Anxiety Difficulty Somewhat difficult Not difficult at all    Facility-Administered Encounter Medications as of 09/08/2019  Medication  . acetaminophen (TYLENOL) tablet 650 mg   Or  . acetaminophen (TYLENOL) suppository 650 mg  . albuterol (PROVENTIL) (2.5 MG/3ML) 0.083% nebulizer solution 2.5 mg  . aspirin EC tablet 81 mg  . azithromycin (ZITHROMAX) 500 mg in sodium chloride 0.9 % 250 mL IVPB  . cefTRIAXone (ROCEPHIN) 2 g in sodium chloride 0.9 % 100 mL IVPB  . enoxaparin (LOVENOX) injection 40 mg  . escitalopram (LEXAPRO) tablet 10 mg  . ferrous sulfate tablet 325 mg  . ipratropium-albuterol (DUONEB) 0.5-2.5 (3) MG/3ML nebulizer solution 3 mL  . levothyroxine (SYNTHROID) tablet 100 mcg  . linagliptin (TRADJENTA) tablet 5 mg  . lubiprostone  (AMITIZA) capsule 24 mcg  . ondansetron (ZOFRAN) tablet 4 mg   Or  . ondansetron (ZOFRAN) injection 4 mg  . rosuvastatin (CRESTOR) tablet 10 mg  . spironolactone (ALDACTONE) tablet 25 mg  . torsemide (DEMADEX) tablet 40 mg  . traZODone (DESYREL) tablet 50 mg   Outpatient Encounter Medications as of 09/08/2019  Medication Sig  . acetaZOLAMIDE (DIAMOX) 250 MG tablet Take by mouth.  [name] albuterol (PROVENTIL) (2.5 MG/3ML) 0.083% nebulizer solution Take 2.5 mg by nebulization every 6 (six) hours as needed for wheezing or shortness of breath.  [name] albuterol (VENTOLIN HFA) 108 (90 Base) MCG/ACT inhaler Inhale 2 puffs into the lungs every 6 (six) hours as needed for wheezing or shortness of breath.  [name] amoxicillin-clavulanate (AUGMENTIN) 875-125 MG tablet Take 1 tablet by mouth 2 (two) times daily for 5 days.  [name] aspirin EC 81 MG tablet Take 81 mg by mouth daily.  [name] azithromycin (ZITHROMAX) 500 MG tablet Take 1 tablet (500 mg total) by mouth daily for 5 days. Take 1 tablet daily for 3 days.  . calcium carbonate (OSCAL) 1500 (600 Ca) MG TABS tablet Take by mouth 2 (two) times daily with a meal.  . escitalopram (LEXAPRO) 10 MG tablet TAKE ONE (1) TABLET EACH DAY  . ferrous sulfate 325 (65 FE) MG EC tablet Take 325 mg by mouth every Monday, Wednesday, and Friday.   [name] JANUVIA 50 MG tablet TAKE ONE (1) TABLET EACH DAY (Patient taking differently: Take 25 mg by mouth 2 (two)  times daily. )  . levothyroxine (SYNTHROID) 100 MCG tablet Take 1 tablet (100 mcg total) by mouth daily.  [name] LORazepam (ATIVAN) 0.5 MG tablet Take 1 tablet by mouth at bedtime as needed.  . lubiprostone (AMITIZA) 24 MCG capsule Take 1 capsule (24 mcg total) by mouth 2 (two) times daily with a meal. For constipation  . Multiple Vitamin (MULTIVITAMIN WITH MINERALS) TABS Take 1 tablet by mouth daily.  . nitroGLYCERIN (NITROSTAT) 0.4 MG SL tablet Place 0.4 mg under the tongue every 5 (five) minutes as needed. For chest pain  . ondansetron  (ZOFRAN-ODT) 4 MG disintegrating tablet TAKE 1 TABLET EVERY 8 HOURS AS NEEDED FOR NAUSEA AND VOMITING  . pantoprazole (PROTONIX) 40 MG tablet Take 1 tablet (40 mg total) by mouth daily.  . rosuvastatin (CRESTOR) 10 MG tablet Take 10 mg by mouth daily.  [name] spironolactone (ALDACTONE) 25 MG tablet Take 25 mg by mouth daily.   . SYMBICORT 80-4.5 MCG/ACT inhaler Inhale 2 puffs into the lungs 2 (two) times daily as needed.  . torsemide (DEMADEX) 20 MG tablet Take 2 tablets (40 mg total) by mouth 2 (two) times daily.  . traZODone (DESYREL) 50 MG tablet Take 0.5-1 tablets (25-50 mg total) by mouth at bedtime as needed for sleep. (Patient taking differently: Take 50 mg by mouth at bedtime. )    Goals        . Client will talk with LCSW in next 30 days to discuss depression symptoms of client (pt-stated)     Current Barriers:  . Breathing challenges of client . Transportation challenges in client with Chronic Diagnoses of COPD, CAD, GERD, HLD, HTN, MDD, DM . Mobility challenges   Clinical Social Work Clinical Goal(s):  [name] LCSW will talk with client in next 4 weeks to discuss client management of depression symptoms faced  Interventions:  Talked with [name] about RNCM support and LCSW support  Talked with [name] about Medicaid application process for Cristan Septer  Talked with [name] about DME that client already has at his home(has 3 in 1 BSC, lift chair, bed that elevates head and feet, walker, cane, wheelchair, nebulizer, oxygen concentrator)  Talked with [name] about client's decreased energy  Talked with [name] about client use of hospital bed  Talked with [name] about fluid issues of client ([name] said she gives client diuretic as prescribed)  Talked with [name] about client completion of ADLs   Talked with [name] about in home care needs of client  Talked with [name] about sleeping issues of client  Talked with [name] about jerking in client  arms when client  is sleeping and when he is awake  Patient Self Care Activities:  . Enjoys speaking via phone with his wife  . Patient Self Care Deficits:        Needs help with daily ADLs       Needs help with medication administration  Initial Care Plan documentation    Follow Up Plan: LCSW to call client/spouse in next 4 weeks to talk with client/spouse about client management of depression symptoms faced.  [name].Forrest MSW, LCSW Licensed Clinical Social Worker [facility] [phone]

## 2019-09-08 NOTE — Patient Instructions (Addendum)
Licensed Clinical Social Worker Visit Information  Goals we discussed today:  Goals        . Client will talk with LCSW in next 30 days to discuss depression symptoms of client (pt-stated)     Current Barriers:  . Breathing challenges of client . Transportation challenges in client with Chronic Diagnoses of COPD, CAD, GERD, HLD, HTN, MDD, DM . Mobility challenges  Clinical Social Work Clinical Goal(s):  [name] LCSW will talk with client in next 4 weeks to discuss client management of depression symptoms faced  Interventions: Talked with [name] about RNCM support and LCSW support  Talked with [name] about Medicaid application process for [facility]baun  Talked with [name] about DME that client already has at his home (has 3 in 1 BSC, lift chair, bed that elevates head and feet, walker, cane, wheelchair, nebulizer, oxygen concentrator)  Talked with [name] about client's decreased energy  Talked with [name] about client use of hospital bed  Talked with [name] about fluid issues of client ([name] said she gives client diuretic as prescribed)  Talked with [name] about client completion of ADLs  Talked with [name] about in home care needs of client  Talked with [name] about sleeping issues of client  Talked with [name] about jerking in client arms when client is sleeping and when he is awake   Patient Self Care Activities:  . Enjoys speaking via phone with his wife  . Patient Self Care Deficits:         Needs help with daily ADLs       Needs help with medication administration  Initial Care Plan documentation      Materials Provided: No  Follow Up Plan: LCSW to call client/spouse in next 4 weeks to talk with client/spouse about client management of depression symptoms faced.  The patient /[place]ia Brocks, spouse,verbalized understanding of instructions provided today and declined a print copy of patient instruction materials.   [name].Neilson Oehlert MSW,  LCSW Licensed Clinical Social Worker [facility] [phone]

## 2019-09-08 NOTE — TOC Transition Note (Signed)
Transition of Care [facility]) - CM/SW Discharge Note   Patient Details  Name: Matthew Campos MRN: [id number] Date of Birth: December 28, 1940  [facility]) CM/SW Contact:  Boneta Lucks, RN Phone Number: 09/08/2019, 2:57 PM   Clinical Narrative: Patient admitted with pneumonia, high risk for readmission. Patient lives with his wife, walks with a Cane. Used [facility] HH last visit. PT is recommending HHPT, patient states his grand-daughter are coming, they having training in PT. States if he needs more PT he will ask his PCP for orders.      Barriers to Discharge: Barriers Resolved  Patient Goals and CMS Choice Patient states their goals for this hospitalization and ongoing recovery are:: to go home. CMS Medicare.gov Compare Post Acute Care list provided to:: Patient Choice offered to / list presented to : Patient  Discharge Placement            Readmission Risk Interventions Readmission Risk Prevention Plan 09/08/2019  Transportation Screening Complete  PCP or Specialist Appt within 3-5 Days Not Complete  HRI or Home Care Consult Complete  Social Work Consult for [place] Planning/Counseling Complete  Palliative Care Screening Not Applicable  Medication Review [employer]) Complete  Some recent data might be hidden

## 2019-09-08 NOTE — Discharge Summary (Addendum)
Physician Discharge Summary  Matthew Campos [id number] DOB: 21-Nov-1940 DOA: 09/06/2019  PCP: [name], DO  Admit date: 09/06/2019 Discharge date: 09/08/2019  Admitted From: Home Disposition: Home  Recommendations for Outpatient Follow-up:  Follow up with PCP in 1-2 weeks Please obtain BMP/CBC in one week Repeat chest x-ray in 3 to 4 weeks.   Home Health: Home health PT Equipment/Devices:  Discharge Condition: Stable CODE STATUS: Full code Diet recommendation: Heart healthy  Brief/Interim Summary: Mr. Benson is a 79 y.o. M with hx dCHF, hx TAVR, PVD s/p AAA repair, chronic ischemic heart disease, COPD on 3 L home oxygen, DM, GERD, HTN, PAF, bradycardia with recent defibrillator/pacer placement at [facility] who presented with 2 days of fever, malaise, cough.   Patient admitted to [facility] 1 month ago, diuresed 17 L, discharged on torsemide 40 twice daily.  She has been doing well at home for a couple weeks, and even gone out to [place] the lawn several days ago.  Then 2 days prior to admission he was "cruddy".  Then developed a fever and was coughing.  When EMS were called out to house they found his SpO2 mid-70s on home 3L.   In the ER, still saturating 70s on 3L.  WBC normal and afebrile here, but CXR with edema and new retrocardiac opacity.  Started on antibiotics and Diuretics and admitted to hospitalist service  Discharge Diagnoses:  Principal Problem:   CAP (community acquired pneumonia) Active Problems:   COPD (chronic obstructive pulmonary disease) (HCC)   CAD (coronary artery disease)   Gastroesophageal reflux disease   Hyperlipidemia   Essential hypertension   Type 2 diabetes mellitus (HCC)   Normocytic anemia   Paroxysmal atrial fibrillation (HCC)   OSA (obstructive sleep apnea)   Hypomagnesemia   CKD (chronic kidney disease), stage IIIa   Volume overload   Community acquired pneumonia  Community-acquired pneumonia, left lower lobe causing  acute on chronic hypoxic respiratory failure No heavy antibiotic load during last hospitalization, doubt Pseudomonas. -Treated with ceftriaxone and azithromycin -With clinical improvement, he was afebrile and transition to oral antibiotics for discharge -Respiratory status appears to be back to baseline.     Acute on chronic diastolic CHF Got IV Lasix overnight.  Weaned back to home O2, doesn't appear particularly overloaded. -Resume home torsemide -Continue spironolactone   Peripheral vascular disease, secondary prevention Coronary disease secondary prevention Hypertension -Continue aspirin Crestor   Aortic stenosis status post TAVR   Paroxysmal atrial fibrillation status post pacemaker implantation This was diagnosed at [facility] last month, seen by EP, not discharged on anticoagulant.  Unclear why not. -Follow up with EP   COPD No evidence of flare -Hold home inhaled steroid -Albuterol as needed   Hypothyroidism -Continue levothyroxine   GERD -Hold PPI   Diabetes -Continue Januvia as formulary alternative -Start SSI corrections   Anemia of Chronic disease Hgb stable, no clinical bleeding   Thrombocytopenia Mild, no bleeding  Discharge Instructions  Discharge Instructions     Diet - low sodium heart healthy   Complete by: As directed    Increase activity slowly   Complete by: As directed       Allergies as of 09/08/2019       Reactions   Codeine Nausea And Vomiting   Codeine    Latex Hives, Itching   Lisinopril    Lisinopril Cough        Medication List     TAKE these medications    acetaZOLAMIDE 250 MG  tablet Commonly known as: DIAMOX Take by mouth.   albuterol (2.5 MG/3ML) 0.083% nebulizer solution Commonly known as: PROVENTIL Take 2.5 mg by nebulization every 6 (six) hours as needed for wheezing or shortness of breath.   albuterol 108 (90 Base) MCG/ACT inhaler Commonly known as: VENTOLIN HFA Inhale 2 puffs into the lungs every 6 (six)  hours as needed for wheezing or shortness of breath.   amoxicillin-clavulanate 875-125 MG tablet Commonly known as: Augmentin Take 1 tablet by mouth 2 (two) times daily for 5 days.   aspirin EC 81 MG tablet Take 81 mg by mouth daily.   azithromycin 500 MG tablet Commonly known as: Zithromax Take 1 tablet (500 mg total) by mouth daily for 5 days. Take 1 tablet daily for 3 days.   calcium carbonate 1500 (600 Ca) MG Tabs tablet Commonly known as: OSCAL Take by mouth 2 (two) times daily with a meal.   escitalopram 10 MG tablet Commonly known as: LEXAPRO TAKE ONE (1) TABLET EACH DAY   ferrous sulfate 325 (65 FE) MG EC tablet Take 325 mg by mouth every Monday, Wednesday, and Friday.   Januvia 50 MG tablet Generic drug: sitaGLIPtin TAKE ONE (1) TABLET EACH DAY What changed: See the new instructions.   levothyroxine 100 MCG tablet Commonly known as: SYNTHROID Take 1 tablet (100 mcg total) by mouth daily.   LORazepam 0.5 MG tablet Commonly known as: ATIVAN Take 1 tablet by mouth at bedtime as needed.   lubiprostone 24 MCG capsule Commonly known as: Amitiza Take 1 capsule (24 mcg total) by mouth 2 (two) times daily with a meal. For constipation   multivitamin with minerals Tabs tablet Take 1 tablet by mouth daily.   nitroGLYCERIN 0.4 MG SL tablet Commonly known as: NITROSTAT Place 0.4 mg under the tongue every 5 (five) minutes as needed. For chest pain   ondansetron 4 MG disintegrating tablet Commonly known as: ZOFRAN-ODT TAKE 1 TABLET EVERY 8 HOURS AS NEEDED FOR NAUSEA AND VOMITING   pantoprazole 40 MG tablet Commonly known as: PROTONIX Take 1 tablet (40 mg total) by mouth daily.   rosuvastatin 10 MG tablet Commonly known as: CRESTOR Take 10 mg by mouth daily.   spironolactone 25 MG tablet Commonly known as: ALDACTONE Take 25 mg by mouth daily.   Symbicort 80-4.5 MCG/ACT inhaler Generic drug: budesonide-formoterol Inhale 2 puffs into the lungs 2 (two) times  daily as needed.   torsemide 20 MG tablet Commonly known as: DEMADEX Take 2 tablets (40 mg total) by mouth 2 (two) times daily.   traZODone 50 MG tablet Commonly known as: DESYREL Take 0.5-1 tablets (25-50 mg total) by mouth at bedtime as needed for sleep. What changed:  how much to take when to take this         Allergies  Allergen Reactions   Codeine Nausea And Vomiting   Codeine    Latex Hives and Itching   Lisinopril    Lisinopril Cough    Consultations:    Procedures/Studies: DG Chest Port 1 View  Result Date: 09/06/2019 CLINICAL DATA:  Shortness of breath. Respiratory distress. EXAM: PORTABLE CHEST 1 VIEW COMPARISON:  Most recent radiograph 04/13/2019. Most recent CT 01/04/2019 FINDINGS: Stable cardiomegaly with prior TAVR. Loop recorder in the left chest wall. Aortic atherosclerosis. Vascular congestion. Suggestion of pulmonary edema with mild Kerley B-lines. Possible small pleural effusions. Ill-defined retrocardiac opacity with additional patchy opacities in the mid lung zones. No pneumothorax. IMPRESSION: 1. Stable cardiomegaly. Vascular congestion with possible small pleural  effusions. Suggestion of mild pulmonary edema. 2. Ill-defined retrocardiac opacity with additional patchy opacities in the mid lung zones. This may represent pulmonary edema or superimposed pneumonia. Electronically Signed   By: Keith Rake M.D.   On: 09/06/2019 23:14      Subjective: Feeling better.  No shortness of breath.  Feels that his breathing is back to baseline.  Discharge Exam: Vitals:   09/08/19 0300 09/08/19 0628 09/08/19 0845 09/08/19 1445  BP:  95/60  (!) 110/57  Pulse:  70  72  Resp: 18 16  20   Temp:  98.4 F (36.9 C)  (!) 97.4 F (36.3 C)  TempSrc:  Oral  Oral  SpO2:  98% 98% 95%  Weight:      Height:        General: Pt is alert, awake, not in acute distress Cardiovascular: RRR, S1/S2 +, no rubs, no gallops Respiratory: CTA bilaterally, no wheezing, no  rhonchi Abdominal: Soft, NT, ND, bowel sounds + Extremities: 1+ edema, no cyanosis    The results of significant diagnostics from this hospitalization (including imaging, microbiology, ancillary and laboratory) are listed below for reference.     Microbiology: Recent Results (from the past 240 hour(s))  SARS Coronavirus 2 by RT PCR (hospital order, performed in [facility] hospital lab) Nasopharyngeal Nasopharyngeal Swab     Status: None   Collection Time: 09/06/19 10:53 PM   Specimen: Nasopharyngeal Swab  Result Value Ref Range Status   SARS Coronavirus 2 NEGATIVE NEGATIVE Final    Comment: (NOTE) SARS-CoV-2 target nucleic acids are NOT DETECTED. The SARS-CoV-2 RNA is generally detectable in upper and lower respiratory specimens during the acute phase of infection. The lowest concentration of SARS-CoV-2 viral copies this assay can detect is 250 copies / mL. A negative result does not preclude SARS-CoV-2 infection and should not be used as the sole basis for treatment or other patient management decisions.  A negative result may occur with improper specimen collection / handling, submission of specimen other than nasopharyngeal swab, presence of viral mutation(s) within the areas targeted by this assay, and inadequate number of viral copies (<250 copies / mL). A negative result must be combined with clinical observations, patient history, and epidemiological information. Fact Sheet for Patients:   StrictlyIdeas.no Fact Sheet for Healthcare Providers: BankingDealers.co.za This test is not yet approved or cleared  by the [place] FDA and has been authorized for detection and/or diagnosis of SARS-CoV-2 by FDA under an Emergency Use Authorization (EUA).  This EUA will remain in effect (meaning this test can be used) for the duration of the COVID-19 declaration under Section 564(b)(1) of the Act, 21 U.S.C. section 360bbb-3(b)(1),  unless the authorization is terminated or revoked sooner. Performed at [facility], [address], [place], [place] [address]   Culture, blood (single) w Reflex to ID Panel     Status: None (Preliminary result)   Collection Time: 09/06/19 11:13 PM   Specimen: BLOOD  Result Value Ref Range Status   Specimen Description BLOOD RIGHT ANTECUBITAL  Final   Special Requests   Final    BOTTLES DRAWN AEROBIC AND ANAEROBIC Blood Culture adequate volume   Culture   Final    NO GROWTH 2 DAYS Performed at [facility], [address], [place], [place] [address]    Report Status PENDING  Incomplete     Labs: BNP (last 3 results) Recent Labs    04/13/19 1426 09/06/19 2313  BNP 657.0* [id number]*   Basic Metabolic Panel: Recent Labs  Lab 09/06/19 2313 09/06/19 2326 09/07/19 0705  NA 131* 134* 135  K 3.6 3.7 3.5  CL 88* 89* 90*  CO2 31  --  32  GLUCOSE 165* 164* 168*  BUN 28* 26* 26*  CREATININE 1.36* 1.40* 1.25*  CALCIUM 8.3*  --  8.3*  MG 1.6*  --   --   PHOS 3.2  --   --    Liver Function Tests: Recent Labs  Lab 09/06/19 2313 09/07/19 0705  AST 16 15  ALT 12 10  ALKPHOS 56 53  BILITOT 1.0 0.7  PROT 7.0 6.5  ALBUMIN 3.4* 3.1*   No results for input(s): LIPASE, AMYLASE in the last 168 hours. No results for input(s): AMMONIA in the last 168 hours. CBC: Recent Labs  Lab 09/06/19 2313 09/06/19 2326 09/07/19 0705  WBC 4.5  --  4.0  HGB 9.1* 10.2* 9.0*  HCT 29.6* 30.0* 29.6*  MCV 98.3  --  98.0  PLT 123*  --  116*   Cardiac Enzymes: No results for input(s): CKTOTAL, CKMB, CKMBINDEX, TROPONINI in the last 168 hours. BNP: Invalid input(s): POCBNP CBG: No results for input(s): GLUCAP in the last 168 hours. D-Dimer No results for input(s): DDIMER in the last 72 hours. Hgb A1c No results for input(s): HGBA1C in the last 72 hours. Lipid Profile No results for input(s): CHOL, HDL, LDLCALC, TRIG, CHOLHDL, LDLDIRECT in the last 72 hours. Thyroid function studies No  results for input(s): TSH, T4TOTAL, T3FREE, THYROIDAB in the last 72 hours.  Invalid input(s): FREET3 Anemia work up No results for input(s): VITAMINB12, FOLATE, FERRITIN, TIBC, IRON, RETICCTPCT in the last 72 hours. Urinalysis    Component Value Date/Time   APPEARANCEUR Clear 03/04/2019 1548   GLUCOSEU Negative 03/04/2019 1548   BILIRUBINUR Negative 03/04/2019 1548   PROTEINUR Trace (A) 03/04/2019 1548   NITRITE Negative 03/04/2019 1548   LEUKOCYTESUR Negative 03/04/2019 1548   Sepsis Labs Invalid input(s): PROCALCITONIN,  WBC,  LACTICIDVEN Microbiology Recent Results (from the past 240 hour(s))  SARS Coronavirus 2 by RT PCR (hospital order, performed in [place] hospital lab) Nasopharyngeal Nasopharyngeal Swab     Status: None   Collection Time: 09/06/19 10:53 PM   Specimen: Nasopharyngeal Swab  Result Value Ref Range Status   SARS Coronavirus 2 NEGATIVE NEGATIVE Final    Comment: (NOTE) SARS-CoV-2 target nucleic acids are NOT DETECTED. The SARS-CoV-2 RNA is generally detectable in upper and lower respiratory specimens during the acute phase of infection. The lowest concentration of SARS-CoV-2 viral copies this assay can detect is 250 copies / mL. A negative result does not preclude SARS-CoV-2 infection and should not be used as the sole basis for treatment or other patient management decisions.  A negative result may occur with improper specimen collection / handling, submission of specimen other than nasopharyngeal swab, presence of viral mutation(s) within the areas targeted by this assay, and inadequate number of viral copies (<250 copies / mL). A negative result must be combined with clinical observations, patient history, and epidemiological information. Fact Sheet for Patients:   StrictlyIdeas.no Fact Sheet for Healthcare Providers: BankingDealers.co.za This test is not yet approved or cleared  by the [place] FDA  and has been authorized for detection and/or diagnosis of SARS-CoV-2 by FDA under an Emergency Use Authorization (EUA).  This EUA will remain in effect (meaning this test can be used) for the duration of the COVID-19 declaration under Section 564(b)(1) of the Act, 21 U.S.C. section 360bbb-3(b)(1), unless the authorization is terminated  or revoked sooner. Performed at [facility], [address] [place], [place] [address]   Culture, blood (single) w Reflex to ID Panel     Status: None (Preliminary result)   Collection Time: 09/06/19 11:13 PM   Specimen: BLOOD  Result Value Ref Range Status   Specimen Description BLOOD RIGHT ANTECUBITAL  Final   Special Requests   Final    BOTTLES DRAWN AEROBIC AND ANAEROBIC Blood Culture adequate volume   Culture   Final    NO GROWTH 2 DAYS Performed at [facility], [address], [place], [place] [address]    Report Status PENDING  Incomplete     Time coordinating discharge: 62mins  SIGNED:   Kathie Dike, MD  [facility] 09/08/2019, 9:38 PM   If 7PM-7AM, please contact night-coverage [email or web address]

## 2019-09-08 NOTE — Progress Notes (Signed)
Pt discharged to POV via [facility] accompanied by staff. Pt on home O2 and all personal belongings in his possession at time of discharge.

## 2019-09-11 LAB — CULTURE, BLOOD (SINGLE)
Culture: NO GROWTH
Special Requests: ADEQUATE

## 2019-09-13 ENCOUNTER — Other Ambulatory Visit: Payer: Self-pay | Admitting: Family Medicine

## 2019-09-17 DIAGNOSIS — I4891 Unspecified atrial fibrillation: Secondary | ICD-10-CM | POA: Diagnosis not present

## 2019-09-17 DIAGNOSIS — Z4682 Encounter for fitting and adjustment of non-vascular catheter: Secondary | ICD-10-CM | POA: Diagnosis not present

## 2019-09-17 DIAGNOSIS — I5032 Chronic diastolic (congestive) heart failure: Secondary | ICD-10-CM | POA: Diagnosis not present

## 2019-09-17 DIAGNOSIS — K819 Cholecystitis, unspecified: Secondary | ICD-10-CM | POA: Diagnosis not present

## 2019-09-17 DIAGNOSIS — R001 Bradycardia, unspecified: Secondary | ICD-10-CM | POA: Diagnosis not present

## 2019-09-18 ENCOUNTER — Other Ambulatory Visit: Payer: Self-pay | Admitting: Family Medicine

## 2019-09-22 DIAGNOSIS — I480 Paroxysmal atrial fibrillation: Secondary | ICD-10-CM | POA: Diagnosis not present

## 2019-09-22 DIAGNOSIS — K829 Disease of gallbladder, unspecified: Secondary | ICD-10-CM | POA: Diagnosis not present

## 2019-09-24 ENCOUNTER — Ambulatory Visit: Payer: Medicare Other | Admitting: Family Medicine

## 2019-09-27 ENCOUNTER — Ambulatory Visit: Payer: Self-pay | Admitting: Licensed Clinical Social Worker

## 2019-09-27 DIAGNOSIS — I10 Essential (primary) hypertension: Secondary | ICD-10-CM

## 2019-09-27 DIAGNOSIS — E119 Type 2 diabetes mellitus without complications: Secondary | ICD-10-CM

## 2019-09-27 DIAGNOSIS — K219 Gastro-esophageal reflux disease without esophagitis: Secondary | ICD-10-CM

## 2019-09-27 DIAGNOSIS — E782 Mixed hyperlipidemia: Secondary | ICD-10-CM

## 2019-09-27 DIAGNOSIS — J449 Chronic obstructive pulmonary disease, unspecified: Secondary | ICD-10-CM

## 2019-09-27 DIAGNOSIS — I2510 Atherosclerotic heart disease of native coronary artery without angina pectoris: Secondary | ICD-10-CM

## 2019-09-27 DIAGNOSIS — F322 Major depressive disorder, single episode, severe without psychotic features: Secondary | ICD-10-CM

## 2019-09-27 NOTE — Patient Instructions (Addendum)
Licensed Clinical Social Worker Visit Information  Materials Provided: No  09/27/2019  Name: Matthew Campos       MRN: [id number]       DOB: 06-27-1940  Matthew Campos is a 79 y.o. year old male who is a primary care patient of [name], DO. The CCM team was consulted for assistance with Community resources.   Review of patient status, including review of consultants reports, other relevant assessments, and collaboration with appropriate care team members and the patient's provider was performed as part of comprehensive patient evaluation and provision of chronic care management services.    SDOH (Social Determinants of Health) assessments performed: Yes; risk for social isolation; risk for tobacco use;risk for stress; risk for physical inactivity   LCSW called home phone number of client and spouse several times today; but, LCSW was not able to speak today via phone with client or spouse of client. However, LCSW did leave phone message for client/spouse today requesting return call to LCSW at [phone]   Follow Up Plan: LCSW to call client/spouse in next 4 weeks to talk with client/spouse about client management of depression symptoms faced.  LCSW was not able to speak via phone with client or spouse of client today; thus the patient  or spouse were not able to verbalize understanding of instructions provided today and patient or spouse were not able to accept or decline a print copy of patient instruction materials.   [name].Johanna Matto MSW, LCSW Licensed Clinical Social Worker [facility] [phone]

## 2019-09-27 NOTE — Chronic Care Management (AMB) (Signed)
Chronic Care Management    Clinical Social Work Follow Up Note  09/27/2019 Name: MAMOUDOU MULVEHILL MRN: [id number] DOB: 12-28-1940  CHERRY WITTWER is a 79 y.o. year old male who is a primary care patient of [name], DO. The CCM team was consulted for assistance with Community resources.   Review of patient status, including review of consultants reports, other relevant assessments, and collaboration with appropriate care team members and the patient's provider was performed as part of comprehensive patient evaluation and provision of chronic care management services.    SDOH (Social Determinants of Health) assessments performed: Yes; risk for social isolation; risk for tobacco use;risk for stress; risk for physical inactivity     Office Visit from 05/25/2019 in [place]  PHQ-9 Total Score 0     GAD 7 : Generalized Anxiety Score 05/13/2019 10/28/2018  Nervous, Anxious, on Edge 1 3  Control/stop worrying 1 3  Worry too much - different things 1 3  Trouble relaxing 1 0  Restless 1 3  Easily annoyed or irritable 0 3  Afraid - awful might happen 0 0  Total GAD 7 Score 5 15  Anxiety Difficulty Somewhat difficult Not difficult at all    Outpatient Encounter Medications as of 09/27/2019  Medication Sig  . acetaZOLAMIDE (DIAMOX) 250 MG tablet Take by mouth.  [name] albuterol (PROVENTIL) (2.5 MG/3ML) 0.083% nebulizer solution Take 2.5 mg by nebulization every 6 (six) hours as needed for wheezing or shortness of breath.  [name] albuterol (VENTOLIN HFA) 108 (90 Base) MCG/ACT inhaler Inhale 2 puffs into the lungs every 6 (six) hours as needed for wheezing or shortness of breath.  [name] aspirin EC 81 MG tablet Take 81 mg by mouth daily.  . calcium carbonate (OSCAL) 1500 (600 Ca) MG TABS tablet Take by mouth 2 (two) times daily with a meal.  . escitalopram (LEXAPRO) 10 MG tablet TAKE ONE (1) TABLET EACH DAY  . ferrous sulfate 325 (65 FE) MG EC tablet Take 325 mg by mouth  every Monday, Wednesday, and Friday.   [name] JANUVIA 50 MG tablet TAKE ONE (1) TABLET EACH DAY (Patient taking differently: Take 25 mg by mouth 2 (two) times daily. )  . levothyroxine (SYNTHROID) 100 MCG tablet Take 1 tablet (100 mcg total) by mouth daily.  [name] LORazepam (ATIVAN) 0.5 MG tablet Take 1 tablet by mouth at bedtime as needed.  . lubiprostone (AMITIZA) 24 MCG capsule Take 1 capsule (24 mcg total) by mouth 2 (two) times daily with a meal. For constipation  . Multiple Vitamin (MULTIVITAMIN WITH MINERALS) TABS Take 1 tablet by mouth daily.  . nitroGLYCERIN (NITROSTAT) 0.4 MG SL tablet Place 0.4 mg under the tongue every 5 (five) minutes as needed. For chest pain  . ondansetron (ZOFRAN-ODT) 4 MG disintegrating tablet TAKE 1 TABLET EVERY 8 HOURS AS NEEDED FOR NAUSEA AND VOMITING  . pantoprazole (PROTONIX) 40 MG tablet Take 1 tablet (40 mg total) by mouth daily.  . rosuvastatin (CRESTOR) 10 MG tablet Take 10 mg by mouth daily.  [name] spironolactone (ALDACTONE) 25 MG tablet Take 25 mg by mouth daily.   . SYMBICORT 80-4.5 MCG/ACT inhaler Inhale 2 puffs into the lungs 2 (two) times daily as needed.  . torsemide (DEMADEX) 20 MG tablet Take 2 tablets (40 mg total) by mouth 2 (two) times daily.  . traZODone (DESYREL) 50 MG tablet Take 0.5-1 tablets (25-50 mg total) by mouth at bedtime as needed for sleep. (Patient taking differently: Take 50 mg  by mouth at bedtime. )   No facility-administered encounter medications on file as of 09/27/2019.    LCSW called home phone number of client and spouse several times today; but, LCSW was not able to speak today via phone with client or spouse of client. However, LCSW did leave phone message for client/spouse today requesting return call to LCSW at [phone]   Follow Up Plan: LCSW to call client/spouse in next 4 weeks to talk with client/spouse about client management of depression symptoms faced.  [name].Skarlet Lyons MSW, LCSW Licensed Clinical Social  Worker [facility] [phone]

## 2019-09-28 DIAGNOSIS — I509 Heart failure, unspecified: Secondary | ICD-10-CM | POA: Diagnosis not present

## 2019-09-28 DIAGNOSIS — I4891 Unspecified atrial fibrillation: Secondary | ICD-10-CM | POA: Diagnosis not present

## 2019-09-28 DIAGNOSIS — Z950 Presence of cardiac pacemaker: Secondary | ICD-10-CM | POA: Diagnosis not present

## 2019-09-29 ENCOUNTER — Encounter: Payer: Self-pay | Admitting: Family Medicine

## 2019-10-02 DIAGNOSIS — J449 Chronic obstructive pulmonary disease, unspecified: Secondary | ICD-10-CM | POA: Diagnosis not present

## 2019-10-15 ENCOUNTER — Other Ambulatory Visit: Payer: Self-pay | Admitting: Family Medicine

## 2019-10-15 DIAGNOSIS — R112 Nausea with vomiting, unspecified: Secondary | ICD-10-CM

## 2019-10-15 DIAGNOSIS — F5101 Primary insomnia: Secondary | ICD-10-CM

## 2019-10-15 NOTE — Telephone Encounter (Signed)
Last office visit 07/23/2019  Trazodone last refill 07/23/2019, #30, 3 refills Zofran last refill 08/18/2019, #20, no refills

## 2019-10-26 ENCOUNTER — Other Ambulatory Visit: Payer: Self-pay | Admitting: Family Medicine

## 2019-10-26 DIAGNOSIS — K5909 Other constipation: Secondary | ICD-10-CM

## 2019-10-26 DIAGNOSIS — R112 Nausea with vomiting, unspecified: Secondary | ICD-10-CM

## 2019-10-27 NOTE — Telephone Encounter (Signed)
Has appt with Dr Darnell Level 11/04/19  Ok to fill Zofran?  Our sig for Januvia says 1 time daily but pt says he is taking twice daily?

## 2019-10-28 DIAGNOSIS — I509 Heart failure, unspecified: Secondary | ICD-10-CM | POA: Diagnosis not present

## 2019-11-01 ENCOUNTER — Telehealth: Payer: Self-pay

## 2019-11-01 DIAGNOSIS — J449 Chronic obstructive pulmonary disease, unspecified: Secondary | ICD-10-CM | POA: Diagnosis not present

## 2019-11-04 ENCOUNTER — Telehealth: Payer: Self-pay | Admitting: Pharmacist

## 2019-11-04 ENCOUNTER — Encounter: Payer: Self-pay | Admitting: Family Medicine

## 2019-11-04 ENCOUNTER — Ambulatory Visit (INDEPENDENT_AMBULATORY_CARE_PROVIDER_SITE_OTHER): Payer: Medicare Other | Admitting: Family Medicine

## 2019-11-04 ENCOUNTER — Other Ambulatory Visit: Payer: Self-pay

## 2019-11-04 VITALS — BP 117/56 | HR 70 | Temp 97.5°F | Ht 68.0 in | Wt 189.0 lb

## 2019-11-04 DIAGNOSIS — E1169 Type 2 diabetes mellitus with other specified complication: Secondary | ICD-10-CM | POA: Diagnosis not present

## 2019-11-04 DIAGNOSIS — I10 Essential (primary) hypertension: Secondary | ICD-10-CM | POA: Diagnosis not present

## 2019-11-04 DIAGNOSIS — R7989 Other specified abnormal findings of blood chemistry: Secondary | ICD-10-CM

## 2019-11-04 DIAGNOSIS — E1159 Type 2 diabetes mellitus with other circulatory complications: Secondary | ICD-10-CM

## 2019-11-04 DIAGNOSIS — I480 Paroxysmal atrial fibrillation: Secondary | ICD-10-CM

## 2019-11-04 DIAGNOSIS — Z7409 Other reduced mobility: Secondary | ICD-10-CM

## 2019-11-04 DIAGNOSIS — I2510 Atherosclerotic heart disease of native coronary artery without angina pectoris: Secondary | ICD-10-CM | POA: Diagnosis not present

## 2019-11-04 LAB — BAYER DCA HB A1C WAIVED: HB A1C (BAYER DCA - WAIVED): 8 % — ABNORMAL HIGH (ref <7.0)

## 2019-11-04 MED ORDER — DAPAGLIFLOZIN PROPANEDIOL 5 MG PO TABS: 5.0000 mg | ORAL_TABLET | Freq: Every day | ORAL | 3 refills | Status: DC

## 2019-11-04 NOTE — Progress Notes (Signed)
Subjective: CC: Atrial fibrillation, CHF, CKD 3A PCP: [name], DO LFY:BOFB D Chalfant is a 79 y.o. male presenting to clinic today for:  1.  Atrial fibrillation, CHF, CKD 3A Patient reports compliance with his medications.  He was recently restarted on spironolactone.  Denies any shortness of breath.  He continues to use supplemental oxygen but forgot it today.  He occasionally has some chest pressure and has discussed this with his cardiologist previously.  Apparently they will be proceeding with a watchman procedure soon.  Does not report any bleeding.  No bleeding from his gallbladder tube.  He does not have a follow-up with that specialist until September.  They are keeping it clean and bandaged typically.  2.  Type 2 diabetes Patient reports compliance with Januvia 50 mg daily, Crestor 10 mg daily.  He does not report any hypoglycemic episodes.  No visual disturbance.  3.  Hypothyroidism Patient reports compliance with half a tablet of Synthroid 100 mcg daily.  Not reporting change in voice, tremor.  No unplanned weight loss.  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   Abdominal aortic aneurysm without rupture (HCC)    Anemia    Anxiety and depression    Aortic stenosis    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HCC)    CHF (congestive heart failure) (HCC)    Cholecystitis    Chronic ischemic heart disease    Complication of anesthesia    hard to be put to sleep   COPD (chronic obstructive pulmonary disease) (HCC)    Coronary artery disease    Diabetes mellitus without complication (HCC)    Diabetes mellitus without complication ([place])    takes Januvia and Metformin daily   GERD (gastroesophageal reflux disease)    GERD (gastroesophageal  reflux disease)    takes omeprazole daily   Headache(784.0)    sinus   History of bronchitis    last time >66yr ago   Hyperlipidemia    takes Lipitor daily   Hypertension    Hypertension    takes Metoprolol daily   Joint pain    [place]ocardial infarction ([place]    x 3;last one about 3-436yrago   Neoplasm of uncertain behavior of bladder    Obesity    OSA (obstructive sleep apnea)    Pacemaker    Pancytopenia (HCC)    Paroxysmal atrial fibrillation (HCC)    Pneumonia    last itme about 9y51yrgo   S/P TAVR (transcatheter aortic valve replacement)    Severe aortic stenosis    Sinus bradycardia     Current Outpatient Medications:    acetaZOLAMIDE (DIAMOX) 250 MG tablet, Take by mouth., Disp: , Rfl:    albuterol (PROVENTIL) (2.5 MG/3ML) 0.083% nebulizer solution, Take 2.5 mg by nebulization every 6 (six) hours as needed for wheezing or shortness of breath., Disp: , Rfl:    albuterol (VENTOLIN HFA) 108 (90 Base) MCG/ACT inhaler, Inhale 2 puffs into the lungs every 6 (six) hours as needed for wheezing or shortness of breath., Disp: 18 g, Rfl: 3   aspirin EC 81 MG tablet, Take 81 mg by mouth daily., Disp: , Rfl:    calcium carbonate (OSCAL) 1500 (600 Ca) MG TABS tablet, Take by mouth  2 (two) times daily with a meal., Disp: , Rfl:    escitalopram (LEXAPRO) 10 MG tablet, TAKE ONE (1) TABLET EACH DAY, Disp: 90 tablet, Rfl: 0   ferrous sulfate 325 (65 FE) MG EC tablet, Take 325 mg by mouth every Monday, Wednesday, and Friday. , Disp: , Rfl:    JANUVIA 50 MG tablet, TAKE ONE (1) TABLET EACH DAY, Disp: 90 tablet, Rfl: 0   levothyroxine (SYNTHROID) 100 MCG tablet, Take 1 tablet (100 mcg total) by mouth daily., Disp: 90 tablet, Rfl: 3   LORazepam (ATIVAN) 0.5 MG tablet, Take 1 tablet by mouth at bedtime as needed., Disp: , Rfl:    lubiprostone (AMITIZA) 24 MCG capsule, TAKE 1 CAPSULE TWICE A DAY WITH MEALS., Disp: 60 capsule, Rfl: 0   Multiple Vitamin (MULTIVITAMIN WITH  MINERALS) TABS, Take 1 tablet by mouth daily., Disp: , Rfl:    nitroGLYCERIN (NITROSTAT) 0.4 MG SL tablet, Place 0.4 mg under the tongue every 5 (five) minutes as needed. For chest pain, Disp: , Rfl:    ondansetron (ZOFRAN-ODT) 4 MG disintegrating tablet, TAKE 1 TABLET EVERY 8 HOURS AS NEEDED FOR NAUSEA AND VOMITING, Disp: 20 tablet, Rfl: 0   pantoprazole (PROTONIX) 40 MG tablet, Take 1 tablet (40 mg total) by mouth daily., Disp: 90 tablet, Rfl: 3   rosuvastatin (CRESTOR) 10 MG tablet, Take 10 mg by mouth daily., Disp: , Rfl:    spironolactone (ALDACTONE) 25 MG tablet, Take 25 mg by mouth daily. , Disp: , Rfl:    SYMBICORT 80-4.5 MCG/ACT inhaler, Inhale 2 puffs into the lungs 2 (two) times daily as needed., Disp: , Rfl:    torsemide (DEMADEX) 20 MG tablet, Take 2 tablets (40 mg total) by mouth 2 (two) times daily., Disp: 180 tablet, Rfl: 2   traZODone (DESYREL) 50 MG tablet, TAKE 1/2 TO 1 TABLET AT BEDTIME AS NEEDED FOR SLEEP, Disp: 30 tablet, Rfl: 3 Social History   Socioeconomic History   Marital status: Married    Spouse name: Not on file   Number of children: Not on file   Years of education: Not on file   Highest education level: Not on file  Occupational History   Not on file  Tobacco Use   Smoking status: Former Smoker   Smokeless tobacco: Never Used   Tobacco comment: quit smoking 20+yrs ago  Vaping Use   Vaping Use: Never used  Substance and Sexual Activity   Alcohol use: No    Comment: last drank about 2-3 years ago   Drug use: No   Sexual activity: Yes  Other Topics Concern   Not on file  Social History Narrative   ** Merged History Encounter **       Social Determinants of Health   Financial Resource Strain: Low Risk    Difficulty of Paying Living Expenses: Not very hard  Food Insecurity: No Food Insecurity   Worried About Charity fundraiser in the Last Year: Never true   Arboriculturist in the Last Year: Never true  Transportation  Needs: Unmet Transportation Needs   Lack of Transportation (Medical): No   Lack of Transportation (Non-Medical): Yes  Physical Activity: Inactive   Days of Exercise per Week: 0 days   Minutes of Exercise per Session: 0 min  Stress: Stress Concern Present   Feeling of Stress : To some extent  Social Connections: Moderately Isolated   Frequency of Communication with Friends and Family: Twice a week   Frequency of Social  Gatherings with Friends and Family: Twice a week   Attends Religious Services: Never   Marine scientist or Organizations: No   Attends Music therapist: Never   Marital Status: Married  Human resources officer Violence: Not At Risk   Fear of Current or Ex-Partner: No   Emotionally Abused: No   Physically Abused: No   Sexually Abused: No   Family History  Problem Relation Age of Onset   Cancer Mother        Unknown type   Heart disease Father    Diabetes Father    Heart disease Sister    Seizures Brother    Diabetes Daughter    Diabetes Daughter    Heart attack Son    Colon cancer Mother        in her 37s.    Stomach cancer Neg Hx    Esophageal cancer Neg Hx     Objective: Office vital signs reviewed. BP (!) 117/56    Pulse 70    Temp (!) 97.5 F (36.4 C)    Ht _0  (1.727 m)    Wt 189 lb (85.7 kg)    SpO2 90%    BMI 28.74 kg/m   Physical Examination:  General: Awake, alert, chronically ill appearing, No acute distress HEENT: Normal; sclera white Cardio: regular rate and rhythm w/ occasional skipped beats, S1S2 heard, soft systolic murmurs appreciated Pulm: clear to auscultation bilaterally, no wheezes, rhonchi or rales; normal work of breathing on room air GI: Tube in place with no bleeding, exudates or leaking.  No surrounding erythema, induration or tenderness. Extremities: warm, well perfused, No edema, cyanosis or clubbing; +2 pulses bilaterally Neuro: See diabetic foot exam below  Diabetic Foot Exam -  Simple   Simple Foot Form Diabetic Foot exam was performed with the following findings: Yes 11/04/2019  3:35 PM  Visual Inspection See comments: Yes Sensation Testing Intact to touch and monofilament testing bilaterally: Yes Pulse Check Posterior Tibialis and Dorsalis pulse intact bilaterally: Yes Comments Onychomycotic changes noted to bilateral feet.  He has some flaking of the skin but no overt ulcerations.     Assessment/ Plan: 79 y.o. male   1. Type 2 diabetes mellitus with other specified complication, without long-term current use of insulin (HCC) Not controlled with A1c at 8 today.  Continue Januvia.  We have added Farxiga 5 mg daily and samples are provided.  The pharmacist went over how to take the medication appropriately and red flag signs and symptoms to look out for today. - Bayer DCA Hb A1c Waived - CBC with Differential/Platelet - Microalbumin / creatinine urine ratio  2. Hypertension associated with diabetes ([facility]) Controlled. - CMP14+EGFR - CBC with Differential/Platelet  3. Coronary artery disease involving native coronary artery of native heart without angina pectoris Follow-up with cardiology as scheduled.  They are awaiting his call today - CBC with Differential/Platelet  4. Paroxysmal atrial fibrillation (HCC) Occasional extra beats noted today but seemingly sinus rhythm.  Possible watchman procedure - CBC with Differential/Platelet  5. Abnormal TSH Check thyroid panel.  Will adjust Synthroid pending levels - Thyroid Panel With TSH  6. Impaired mobility and endurance   Orders Placed This Encounter  Procedures   Bayer [place] Hb A1c Waived   CMP14+EGFR   CBC with Differential/Platelet   Microalbumin / creatinine urine ratio   Thyroid Panel With TSH   Meds ordered this encounter  Medications   dapagliflozin propanediol (FARXIGA) 5 MG TABS tablet  Sig: Take 1 tablet (5 mg total) by mouth daily before breakfast.    Dispense:  30 tablet     Refill:  3     Orel Cooler Windell Moulding, DO [place] [phone]

## 2019-11-04 NOTE — Telephone Encounter (Signed)
New start Farxiga GFR 54 Will start 5mg  daily (4 weeks of samples given LOT#[id number], EXP1/24) Encouraged patient to stay hydrated  Encouraged patient to call if questions arise  F/u with pharmD in 2-3 weeks

## 2019-11-05 LAB — CMP14+EGFR
ALT: 9 IU/L (ref 0–44)
AST: 12 IU/L (ref 0–40)
Albumin/Globulin Ratio: 1.5 (ref 1.2–2.2)
Albumin: 4.2 g/dL (ref 3.7–4.7)
Alkaline Phosphatase: 107 IU/L (ref 48–121)
BUN/Creatinine Ratio: 14 (ref 10–24)
BUN: 24 mg/dL (ref 8–27)
Bilirubin Total: 0.5 mg/dL (ref 0.0–1.2)
CO2: 32 mmol/L — ABNORMAL HIGH (ref 20–29)
Calcium: 8.9 mg/dL (ref 8.6–10.2)
Chloride: 93 mmol/L — ABNORMAL LOW (ref 96–106)
Creatinine, Ser: 1.66 mg/dL — ABNORMAL HIGH (ref 0.76–1.27)
GFR calc Af Amer: 45 mL/min/{1.73_m2} — ABNORMAL LOW (ref >59)
GFR calc non Af Amer: 39 mL/min/{1.73_m2} — ABNORMAL LOW (ref >59)
Globulin, Total: 2.8 g/dL (ref 1.5–4.5)
Glucose: 216 mg/dL — ABNORMAL HIGH (ref 65–99)
Potassium: 4.2 mmol/L (ref 3.5–5.2)
Sodium: 137 mmol/L (ref 134–144)
Total Protein: 7 g/dL (ref 6.0–8.5)

## 2019-11-05 LAB — CBC WITH DIFFERENTIAL/PLATELET
Basophils Absolute: 0 10*3/uL (ref 0.0–0.2)
Basos: 1 %
EOS (ABSOLUTE): 0.1 10*3/uL (ref 0.0–0.4)
Eos: 2 %
Hematocrit: 34.6 % — ABNORMAL LOW (ref 37.5–51.0)
Hemoglobin: 11.4 g/dL — ABNORMAL LOW (ref 13.0–17.7)
Immature Grans (Abs): 0.1 10*3/uL (ref 0.0–0.1)
Immature Granulocytes: 2 %
Lymphocytes Absolute: 0.8 10*3/uL (ref 0.7–3.1)
Lymphs: 14 %
MCH: 32 pg (ref 26.6–33.0)
MCHC: 32.9 g/dL (ref 31.5–35.7)
MCV: 97 fL (ref 79–97)
Monocytes Absolute: 0.5 10*3/uL (ref 0.1–0.9)
Monocytes: 8 %
Neutrophils Absolute: 4.2 10*3/uL (ref 1.4–7.0)
Neutrophils: 73 %
Platelets: 141 10*3/uL — ABNORMAL LOW (ref 150–450)
RBC: 3.56 x10E6/uL — ABNORMAL LOW (ref 4.14–5.80)
RDW: 16.3 % — ABNORMAL HIGH (ref 11.6–15.4)
WBC: 5.7 10*3/uL (ref 3.4–10.8)

## 2019-11-05 LAB — THYROID PANEL WITH TSH
Free Thyroxine Index: 2.5 (ref 1.2–4.9)
T3 Uptake Ratio: 34 % (ref 24–39)
T4, Total: 7.3 ug/dL (ref 4.5–12.0)
TSH: 2.12 u[IU]/mL (ref 0.450–4.500)

## 2019-11-12 DIAGNOSIS — T8131XD Disruption of external operation (surgical) wound, not elsewhere classified, subsequent encounter: Secondary | ICD-10-CM | POA: Diagnosis not present

## 2019-11-12 DIAGNOSIS — S31109A Unspecified open wound of abdominal wall, unspecified quadrant without penetration into peritoneal cavity, initial encounter: Secondary | ICD-10-CM | POA: Diagnosis not present

## 2019-11-18 ENCOUNTER — Telehealth: Payer: Medicare Other | Admitting: Pharmacist

## 2019-11-19 ENCOUNTER — Other Ambulatory Visit: Payer: Self-pay | Admitting: Family Medicine

## 2019-11-19 DIAGNOSIS — K5909 Other constipation: Secondary | ICD-10-CM

## 2019-11-24 DIAGNOSIS — K819 Cholecystitis, unspecified: Secondary | ICD-10-CM | POA: Diagnosis not present

## 2019-11-26 ENCOUNTER — Other Ambulatory Visit: Payer: Self-pay | Admitting: Family Medicine

## 2019-11-26 DIAGNOSIS — R112 Nausea with vomiting, unspecified: Secondary | ICD-10-CM

## 2019-11-26 NOTE — Telephone Encounter (Signed)
Last office visit 11/04/2019  Last refill 10/27/2019, #20, no refills

## 2019-11-28 DIAGNOSIS — I509 Heart failure, unspecified: Secondary | ICD-10-CM | POA: Diagnosis not present

## 2019-12-01 DIAGNOSIS — Z434 Encounter for attention to other artificial openings of digestive tract: Secondary | ICD-10-CM | POA: Diagnosis not present

## 2019-12-01 DIAGNOSIS — K819 Cholecystitis, unspecified: Secondary | ICD-10-CM | POA: Diagnosis not present

## 2019-12-01 DIAGNOSIS — Z4682 Encounter for fitting and adjustment of non-vascular catheter: Secondary | ICD-10-CM | POA: Diagnosis not present

## 2019-12-02 ENCOUNTER — Telehealth: Payer: Self-pay | Admitting: Family Medicine

## 2019-12-02 DIAGNOSIS — J449 Chronic obstructive pulmonary disease, unspecified: Secondary | ICD-10-CM | POA: Diagnosis not present

## 2019-12-02 NOTE — Telephone Encounter (Signed)
Pts wife wanted to call and let Almyra Free know that since pt has started the [place], his sugar levels have been much better. Says the [place] is working.

## 2019-12-03 ENCOUNTER — Ambulatory Visit: Payer: Medicare Other | Admitting: Licensed Clinical Social Worker

## 2019-12-03 DIAGNOSIS — E1159 Type 2 diabetes mellitus with other circulatory complications: Secondary | ICD-10-CM

## 2019-12-03 DIAGNOSIS — I2510 Atherosclerotic heart disease of native coronary artery without angina pectoris: Secondary | ICD-10-CM

## 2019-12-03 DIAGNOSIS — E1169 Type 2 diabetes mellitus with other specified complication: Secondary | ICD-10-CM

## 2019-12-03 DIAGNOSIS — J449 Chronic obstructive pulmonary disease, unspecified: Secondary | ICD-10-CM

## 2019-12-03 DIAGNOSIS — I152 Hypertension secondary to endocrine disorders: Secondary | ICD-10-CM

## 2019-12-03 DIAGNOSIS — K219 Gastro-esophageal reflux disease without esophagitis: Secondary | ICD-10-CM

## 2019-12-03 DIAGNOSIS — E782 Mixed hyperlipidemia: Secondary | ICD-10-CM

## 2019-12-03 DIAGNOSIS — F322 Major depressive disorder, single episode, severe without psychotic features: Secondary | ICD-10-CM

## 2019-12-03 NOTE — Patient Instructions (Addendum)
Licensed Clinical Social Worker Visit Information  Materials Provided: No  12/03/2019  Name: CHEVEZ SAMBRANO       MRN: [id number]     DOB: May 01, 1940  DAIVIK OVERLEY is a 79 y.o. year old male who is a primary care patient of [name], DO. The CCM team was consulted for assistance with [employer] .   Review of patient status, including review of consultants reports, other relevant assessments, and collaboration with appropriate care team members and the patient's provider was performed as part of comprehensive patient evaluation and provision of chronic care management services.    SDOH (Social Determinants of Health) assessments performed: No;risk for social isolation; risk for tobacco use; risk for depression; risk for stress; risk for physical inactivity; risk for transport needs  LCSW called home phone number for client and for [place], spouse of client today. LCSW called home number for client several times today but LCSW was not able to speak via phone with client today. However, LCSW did leave phone message for client and his spouse asking client or [name] to please return call to LCSW at [phone]   Follow Up Plan: LCSW to call client/spouse in next 4 weeks to talk with client/spouse about client management of depression symptoms faced.  LCSW was not able to speak via phone with client or spouse of client today; thus the patient or spouse were not able to verbalize understanding of instructions provided today and were not abl eto accept or decline a print copy of patient instruction materials.   [name].Yarelli Decelles MSW, LCSW Licensed Clinical Social Worker [facility] [phone]

## 2019-12-03 NOTE — Chronic Care Management (AMB) (Signed)
Chronic Care Management    Clinical Social Work Follow Up Note  12/03/2019 Name: JAMEER STORIE MRN: [id number] DOB: 1940/12/24  CHUE BERKOVICH is a 79 y.o. year old male who is a primary care patient of [name], DO. The CCM team was consulted for assistance with [employer] .   Review of patient status, including review of consultants reports, other relevant assessments, and collaboration with appropriate care team members and the patient's provider was performed as part of comprehensive patient evaluation and provision of chronic care management services.    SDOH (Social Determinants of Health) assessments performed: No;risk for social isolation; risk for tobacco use; risk for depression; risk for stress; risk for physical inactivity; risk for transport needs    Office Visit from 11/04/2019 in [place]  PHQ-9 Total Score 19      GAD 7 : Generalized Anxiety Score 11/04/2019 05/13/2019 10/28/2018  Nervous, Anxious, on Edge 3 1 3   Control/stop worrying 0 1 3  Worry too much - different things 0 1 3  Trouble relaxing 3 1 0  Restless 3 1 3   Easily annoyed or irritable 3 0 3  Afraid - awful might happen 3 0 0  Total GAD 7 Score 15 5 15   Anxiety Difficulty Extremely difficult Somewhat difficult Not difficult at all    Outpatient Encounter Medications as of 12/03/2019  Medication Sig  . acetaZOLAMIDE (DIAMOX) 250 MG tablet Take by mouth.  [name] albuterol (PROVENTIL) (2.5 MG/3ML) 0.083% nebulizer solution Take 2.5 mg by nebulization every 6 (six) hours as needed for wheezing or shortness of breath.  [name] albuterol (VENTOLIN HFA) 108 (90 Base) MCG/ACT inhaler Inhale 2 puffs into the lungs every 6 (six) hours as needed for wheezing or shortness of breath.  [name] aspirin EC 81 MG tablet Take 81 mg by mouth daily.  . calcium carbonate (OSCAL) 1500 (600 Ca) MG TABS tablet Take by mouth 2 (two) times daily with a meal.  . dapagliflozin propanediol (FARXIGA) 5  MG TABS tablet Take 1 tablet (5 mg total) by mouth daily before breakfast.  . escitalopram (LEXAPRO) 10 MG tablet TAKE ONE (1) TABLET EACH DAY  . ferrous sulfate 325 (65 FE) MG EC tablet Take 325 mg by mouth every Monday, Wednesday, and Friday.   [name] JANUVIA 50 MG tablet TAKE ONE (1) TABLET EACH DAY  . levothyroxine (SYNTHROID) 100 MCG tablet Take 1 tablet (100 mcg total) by mouth daily.  [name] LORazepam (ATIVAN) 0.5 MG tablet Take 1 tablet by mouth at bedtime as needed.  . lubiprostone (AMITIZA) 24 MCG capsule TAKE 1 CAPSULE TWICE A DAY WITH MEALS.  . Multiple Vitamin (MULTIVITAMIN WITH MINERALS) TABS Take 1 tablet by mouth daily.  . nitroGLYCERIN (NITROSTAT) 0.4 MG SL tablet Place 0.4 mg under the tongue every 5 (five) minutes as needed. For chest pain  . ondansetron (ZOFRAN-ODT) 4 MG disintegrating tablet TAKE 1 TABLET EVERY 8 HOURS AS NEEDED FOR NAUSEA AND VOMITING  . pantoprazole (PROTONIX) 40 MG tablet Take 1 tablet (40 mg total) by mouth daily.  . rosuvastatin (CRESTOR) 10 MG tablet Take 10 mg by mouth daily.  [name] spironolactone (ALDACTONE) 25 MG tablet Take 25 mg by mouth daily.   . SYMBICORT 80-4.5 MCG/ACT inhaler Inhale 2 puffs into the lungs 2 (two) times daily as needed.  . torsemide (DEMADEX) 20 MG tablet Take 2 tablets (40 mg total) by mouth 2 (two) times daily.  . traZODone (DESYREL) 50 MG tablet TAKE 1/2 TO  1 TABLET AT BEDTIME AS NEEDED FOR SLEEP   No facility-administered encounter medications on file as of 12/03/2019.   LCSW called home phone number for client and for [place], spouse of client today. LCSW called home number for client several times today but LCSW was not able to speak via phone with client today. However, LCSW did leave phone message for client and his spouse asking client or [name] to please return call to LCSW at [phone]   Follow Up Plan:  LCSW to call client/spouse in next 4 weeks to talk with client/spouse about client management of depression  symptoms faced.  [name].Allura Doepke MSW, LCSW Licensed Clinical Social Worker [facility] [phone]

## 2019-12-03 NOTE — Telephone Encounter (Signed)
BGs improving on Farxiga 5mg  Will likely increase to 10mg  at f/u

## 2019-12-10 DIAGNOSIS — S41111A Laceration without foreign body of right upper arm, initial encounter: Secondary | ICD-10-CM | POA: Diagnosis not present

## 2019-12-10 DIAGNOSIS — K8050 Calculus of bile duct without cholangitis or cholecystitis without obstruction: Secondary | ICD-10-CM | POA: Diagnosis not present

## 2019-12-17 DIAGNOSIS — Z888 Allergy status to other drugs, medicaments and biological substances status: Secondary | ICD-10-CM | POA: Diagnosis not present

## 2019-12-17 DIAGNOSIS — E119 Type 2 diabetes mellitus without complications: Secondary | ICD-10-CM | POA: Diagnosis not present

## 2019-12-17 DIAGNOSIS — G4730 Sleep apnea, unspecified: Secondary | ICD-10-CM | POA: Diagnosis not present

## 2019-12-17 DIAGNOSIS — Z885 Allergy status to narcotic agent status: Secondary | ICD-10-CM | POA: Diagnosis not present

## 2019-12-17 DIAGNOSIS — Z8669 Personal history of other diseases of the nervous system and sense organs: Secondary | ICD-10-CM | POA: Diagnosis not present

## 2019-12-17 DIAGNOSIS — Z7189 Other specified counseling: Secondary | ICD-10-CM | POA: Diagnosis not present

## 2019-12-17 DIAGNOSIS — G4733 Obstructive sleep apnea (adult) (pediatric): Secondary | ICD-10-CM | POA: Diagnosis not present

## 2019-12-17 DIAGNOSIS — I10 Essential (primary) hypertension: Secondary | ICD-10-CM | POA: Diagnosis not present

## 2019-12-17 DIAGNOSIS — Z91040 Latex allergy status: Secondary | ICD-10-CM | POA: Diagnosis not present

## 2019-12-22 DIAGNOSIS — I2510 Atherosclerotic heart disease of native coronary artery without angina pectoris: Secondary | ICD-10-CM | POA: Diagnosis not present

## 2019-12-22 DIAGNOSIS — I10 Essential (primary) hypertension: Secondary | ICD-10-CM | POA: Diagnosis not present

## 2019-12-22 DIAGNOSIS — Z01812 Encounter for preprocedural laboratory examination: Secondary | ICD-10-CM | POA: Diagnosis not present

## 2019-12-28 DIAGNOSIS — Z87891 Personal history of nicotine dependence: Secondary | ICD-10-CM | POA: Diagnosis not present

## 2019-12-28 DIAGNOSIS — Z91040 Latex allergy status: Secondary | ICD-10-CM | POA: Diagnosis not present

## 2019-12-28 DIAGNOSIS — I25110 Atherosclerotic heart disease of native coronary artery with unstable angina pectoris: Secondary | ICD-10-CM | POA: Diagnosis not present

## 2019-12-28 DIAGNOSIS — D649 Anemia, unspecified: Secondary | ICD-10-CM | POA: Diagnosis not present

## 2019-12-28 DIAGNOSIS — I5030 Unspecified diastolic (congestive) heart failure: Secondary | ICD-10-CM | POA: Diagnosis not present

## 2019-12-28 DIAGNOSIS — I2510 Atherosclerotic heart disease of native coronary artery without angina pectoris: Secondary | ICD-10-CM | POA: Diagnosis not present

## 2019-12-28 DIAGNOSIS — Z885 Allergy status to narcotic agent status: Secondary | ICD-10-CM | POA: Diagnosis not present

## 2019-12-28 DIAGNOSIS — I4891 Unspecified atrial fibrillation: Secondary | ICD-10-CM | POA: Diagnosis not present

## 2019-12-28 DIAGNOSIS — I714 Abdominal aortic aneurysm, without rupture: Secondary | ICD-10-CM | POA: Diagnosis not present

## 2019-12-28 DIAGNOSIS — I350 Nonrheumatic aortic (valve) stenosis: Secondary | ICD-10-CM | POA: Diagnosis not present

## 2019-12-28 DIAGNOSIS — Z952 Presence of prosthetic heart valve: Secondary | ICD-10-CM | POA: Diagnosis not present

## 2019-12-28 DIAGNOSIS — I482 Chronic atrial fibrillation, unspecified: Secondary | ICD-10-CM | POA: Diagnosis not present

## 2019-12-28 DIAGNOSIS — Z950 Presence of cardiac pacemaker: Secondary | ICD-10-CM | POA: Diagnosis not present

## 2019-12-28 DIAGNOSIS — Z45018 Encounter for adjustment and management of other part of cardiac pacemaker: Secondary | ICD-10-CM | POA: Diagnosis not present

## 2019-12-28 DIAGNOSIS — Z888 Allergy status to other drugs, medicaments and biological substances status: Secondary | ICD-10-CM | POA: Diagnosis not present

## 2019-12-29 DIAGNOSIS — K819 Cholecystitis, unspecified: Secondary | ICD-10-CM | POA: Diagnosis not present

## 2019-12-29 DIAGNOSIS — D61818 Other pancytopenia: Secondary | ICD-10-CM | POA: Diagnosis not present

## 2019-12-29 DIAGNOSIS — I350 Nonrheumatic aortic (valve) stenosis: Secondary | ICD-10-CM | POA: Diagnosis not present

## 2019-12-29 DIAGNOSIS — I714 Abdominal aortic aneurysm, without rupture: Secondary | ICD-10-CM | POA: Diagnosis not present

## 2019-12-29 DIAGNOSIS — I509 Heart failure, unspecified: Secondary | ICD-10-CM | POA: Diagnosis not present

## 2019-12-29 DIAGNOSIS — Z888 Allergy status to other drugs, medicaments and biological substances status: Secondary | ICD-10-CM | POA: Diagnosis not present

## 2019-12-29 DIAGNOSIS — E039 Hypothyroidism, unspecified: Secondary | ICD-10-CM | POA: Diagnosis not present

## 2019-12-29 DIAGNOSIS — J449 Chronic obstructive pulmonary disease, unspecified: Secondary | ICD-10-CM | POA: Diagnosis not present

## 2019-12-29 DIAGNOSIS — Z9989 Dependence on other enabling machines and devices: Secondary | ICD-10-CM | POA: Diagnosis not present

## 2019-12-29 DIAGNOSIS — K8050 Calculus of bile duct without cholangitis or cholecystitis without obstruction: Secondary | ICD-10-CM | POA: Diagnosis not present

## 2019-12-29 DIAGNOSIS — Z7984 Long term (current) use of oral hypoglycemic drugs: Secondary | ICD-10-CM | POA: Diagnosis not present

## 2019-12-29 DIAGNOSIS — Z87891 Personal history of nicotine dependence: Secondary | ICD-10-CM | POA: Diagnosis not present

## 2019-12-29 DIAGNOSIS — E119 Type 2 diabetes mellitus without complications: Secondary | ICD-10-CM | POA: Diagnosis not present

## 2019-12-29 DIAGNOSIS — G4733 Obstructive sleep apnea (adult) (pediatric): Secondary | ICD-10-CM | POA: Diagnosis not present

## 2019-12-29 DIAGNOSIS — Z885 Allergy status to narcotic agent status: Secondary | ICD-10-CM | POA: Diagnosis not present

## 2019-12-29 DIAGNOSIS — D414 Neoplasm of uncertain behavior of bladder: Secondary | ICD-10-CM | POA: Diagnosis not present

## 2019-12-29 DIAGNOSIS — Z91040 Latex allergy status: Secondary | ICD-10-CM | POA: Diagnosis not present

## 2019-12-29 DIAGNOSIS — Z952 Presence of prosthetic heart valve: Secondary | ICD-10-CM | POA: Diagnosis not present

## 2019-12-29 DIAGNOSIS — E789 Disorder of lipoprotein metabolism, unspecified: Secondary | ICD-10-CM | POA: Diagnosis not present

## 2019-12-29 DIAGNOSIS — I5031 Acute diastolic (congestive) heart failure: Secondary | ICD-10-CM | POA: Diagnosis not present

## 2019-12-29 DIAGNOSIS — R001 Bradycardia, unspecified: Secondary | ICD-10-CM | POA: Diagnosis not present

## 2019-12-29 DIAGNOSIS — I2510 Atherosclerotic heart disease of native coronary artery without angina pectoris: Secondary | ICD-10-CM | POA: Diagnosis not present

## 2019-12-29 DIAGNOSIS — D649 Anemia, unspecified: Secondary | ICD-10-CM | POA: Diagnosis not present

## 2019-12-29 DIAGNOSIS — I4891 Unspecified atrial fibrillation: Secondary | ICD-10-CM | POA: Diagnosis not present

## 2020-01-02 DIAGNOSIS — J449 Chronic obstructive pulmonary disease, unspecified: Secondary | ICD-10-CM | POA: Diagnosis not present

## 2020-01-04 ENCOUNTER — Telehealth: Payer: Medicare Other

## 2020-01-28 ENCOUNTER — Other Ambulatory Visit: Payer: Self-pay | Admitting: Family Medicine

## 2020-01-28 DIAGNOSIS — I509 Heart failure, unspecified: Secondary | ICD-10-CM | POA: Diagnosis not present

## 2020-01-28 DIAGNOSIS — E1169 Type 2 diabetes mellitus with other specified complication: Secondary | ICD-10-CM

## 2020-01-28 DIAGNOSIS — K5909 Other constipation: Secondary | ICD-10-CM

## 2020-01-31 MED ORDER — DAPAGLIFLOZIN PROPANEDIOL 5 MG PO TABS: 5.0000 mg | ORAL_TABLET | Freq: Every day | ORAL | 0 refills | Status: DC

## 2020-01-31 MED ORDER — CLOPIDOGREL BISULFATE 75 MG PO TABS: ORAL_TABLET | ORAL | 0 refills | Status: DC

## 2020-01-31 MED ORDER — LUBIPROSTONE 24 MCG PO CAPS: ORAL_CAPSULE | ORAL | 0 refills | Status: DC

## 2020-01-31 NOTE — Telephone Encounter (Signed)
E-prescribe down. resent 

## 2020-01-31 NOTE — Addendum Note (Signed)
Addended by: Antonietta Barcelona D on: 01/31/2020 09:19 AM   Modules accepted: Orders

## 2020-02-01 DIAGNOSIS — J449 Chronic obstructive pulmonary disease, unspecified: Secondary | ICD-10-CM | POA: Diagnosis not present

## 2020-02-04 ENCOUNTER — Encounter: Payer: Self-pay | Admitting: Family Medicine

## 2020-02-04 ENCOUNTER — Ambulatory Visit (INDEPENDENT_AMBULATORY_CARE_PROVIDER_SITE_OTHER): Payer: Medicare Other | Admitting: Family Medicine

## 2020-02-04 ENCOUNTER — Other Ambulatory Visit: Payer: Self-pay

## 2020-02-04 VITALS — BP 116/61 | HR 73 | Temp 98.0°F | Ht 68.0 in | Wt 182.0 lb

## 2020-02-04 DIAGNOSIS — E1169 Type 2 diabetes mellitus with other specified complication: Secondary | ICD-10-CM | POA: Diagnosis not present

## 2020-02-04 DIAGNOSIS — F32A Depression, unspecified: Secondary | ICD-10-CM

## 2020-02-04 DIAGNOSIS — N1831 Chronic kidney disease, stage 3a: Secondary | ICD-10-CM | POA: Diagnosis not present

## 2020-02-04 DIAGNOSIS — Z23 Encounter for immunization: Secondary | ICD-10-CM | POA: Diagnosis not present

## 2020-02-04 DIAGNOSIS — E1122 Type 2 diabetes mellitus with diabetic chronic kidney disease: Secondary | ICD-10-CM | POA: Diagnosis not present

## 2020-02-04 DIAGNOSIS — F99 Mental disorder, not otherwise specified: Secondary | ICD-10-CM

## 2020-02-04 DIAGNOSIS — Z1159 Encounter for screening for other viral diseases: Secondary | ICD-10-CM | POA: Diagnosis not present

## 2020-02-04 DIAGNOSIS — F419 Anxiety disorder, unspecified: Secondary | ICD-10-CM

## 2020-02-04 DIAGNOSIS — E785 Hyperlipidemia, unspecified: Secondary | ICD-10-CM | POA: Diagnosis not present

## 2020-02-04 DIAGNOSIS — I152 Hypertension secondary to endocrine disorders: Secondary | ICD-10-CM

## 2020-02-04 DIAGNOSIS — E1159 Type 2 diabetes mellitus with other circulatory complications: Secondary | ICD-10-CM | POA: Diagnosis not present

## 2020-02-04 LAB — BAYER DCA HB A1C WAIVED: HB A1C (BAYER DCA - WAIVED): 8.8 % — ABNORMAL HIGH (ref <7.0)

## 2020-02-04 MED ORDER — DAPAGLIFLOZIN PROPANEDIOL 10 MG PO TABS: 10.0000 mg | ORAL_TABLET | Freq: Every day | ORAL | 3 refills | Status: DC

## 2020-02-04 MED ORDER — BUSPIRONE HCL 5 MG PO TABS: ORAL_TABLET | ORAL | 0 refills | Status: AC

## 2020-02-04 NOTE — Progress Notes (Signed)
Subjective: CC: DM2, HTN PCP: [name], DO ASN:KNLZ Matthew Campos is a 79 y.o. male presenting to clinic today for:  1.  Type 2 Diabetes w/ HTN, CKD:  Patient was started on Farxiga 65m at last visit.  He was continued on Januvia 50 mg and Crestor 10 mg daily.  Blood sugars have been running better with the Farxiga 5 mg daily.  Urine output has been good.    Last eye exam: Needs Last foot exam: Up-to-date Last A1c:  Lab Results  Component Value Date   HGBA1C 8.0 (H) 11/04/2019   Nephropathy screen indicated?:  Needs Last flu, zoster and/or pneumovax:  Immunization History  Administered Date(s) Administered  . Influenza,inj,Quad PF,6+ Mos 01/01/2019  . Influenza-Unspecified 02/03/2014, 02/29/2016, 01/20/2017, 02/10/2018  . Moderna SARS-COVID-2 Vaccination 07/05/2019, 08/02/2019  . Pneumococcal Conjugate-13 11/25/2014, 11/25/2014  . Pneumococcal Polysaccharide-23 05/30/2016, 05/30/2016, 01/01/2019  . Tdap 11/25/2014, 11/25/2014    ROS: No reports of chest pain, shortness of breath or foot ulcers  2.  Moodiness/memory problems Patient is compliant with Lexapro 10 mg daily.  Ativan was not helpful.  His wife notes that she "almost left him over the weekend" due to his intolerable attitude and mood swings.  He often "takes things out on his family".  He admits to being moody and "does not want to be hateful".  They are concerned about his memory.  Sometimes they will be driving in the car and he seems confused about where their destination is despite having been told only a few minutes prior.  This seems to be present over the last several months.  He had a night healthcare nurse come by yesterday for checkup and she reports that checkup seem to be pretty good.  ROS: Per HPI  Allergies  Allergen Reactions  . Codeine Nausea And Vomiting  . Codeine   . Latex Hives and Itching  . Lisinopril   . Lisinopril Cough   Past Medical History:  Diagnosis Date  . AAA  (abdominal aortic aneurysm) without rupture ([place]    repaired  . Abdominal aortic aneurysm without rupture ([place]   . Anemia   . Anxiety and depression   . Aortic stenosis   . 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[place]   . CHF (congestive heart failure) ([place]   . Cholecystitis   . Chronic ischemic heart disease   . Complication of anesthesia    hard to be put to sleep  . COPD (chronic obstructive pulmonary disease) ([facility]   . Coronary artery disease   . Diabetes mellitus without complication ([place]   . Diabetes mellitus without complication ([facility]    takes Januvia and Metformin daily  . GERD (gastroesophageal reflux disease)   . GERD (gastroesophageal reflux disease)    takes omeprazole daily  . Headache(784.0)    sinus  . History of bronchitis    last time >665yrago  . Hyperlipidemia    takes Lipitor daily  . Hypertension   . Hypertension    takes Metoprolol daily  . Joint pain   . Myocardial infarction ([facility]   x 3;last one about 3-4y54yrgo  . Neoplasm of uncertain behavior of bladder   . Obesity   . OSA (obstructive sleep apnea)   . Pacemaker   . Pancytopenia ([facility] . Paroxysmal atrial fibrillation (HCC)   . Pneumonia    last itme about [id number]  . S/P TAVR (transcatheter aortic valve replacement)   . Severe aortic stenosis   . Sinus  bradycardia     Current Outpatient Medications:  .  acetaZOLAMIDE (DIAMOX) 250 MG tablet, Take by mouth., Disp: , Rfl:  .  albuterol (PROVENTIL) (2.5 MG/3ML) 0.083% nebulizer solution, Take 2.5 mg by nebulization every 6 (six) hours as needed for wheezing or shortness of breath., Disp: , Rfl:  .  albuterol (VENTOLIN HFA) 108 (90 Base) MCG/ACT inhaler, Inhale 2 puffs into the lungs every 6 (six) hours as needed for wheezing or shortness of breath., Disp: 18 g, Rfl: 3 .  aspirin EC 81 MG tablet, Take 81 mg by mouth daily., Disp: , Rfl:  .  calcium carbonate (OSCAL) 1500 (600 Ca) MG TABS tablet, Take by mouth 2 (two) times daily with a meal., Disp: ,  Rfl:  .  clopidogrel (PLAVIX) 75 MG tablet, TAKE ONE (1) TABLET EACH DAY, Disp: 90 tablet, Rfl: 0 .  dapagliflozin propanediol (FARXIGA) 5 MG TABS tablet, Take 1 tablet (5 mg total) by mouth daily before breakfast., Disp: 30 tablet, Rfl: 0 .  escitalopram (LEXAPRO) 10 MG tablet, TAKE ONE (1) TABLET EACH DAY, Disp: 90 tablet, Rfl: 0 .  ferrous sulfate 325 (65 FE) MG EC tablet, Take 325 mg by mouth every Monday, Wednesday, and Friday. , Disp: , Rfl:  .  JANUVIA 50 MG tablet, TAKE ONE (1) TABLET EACH DAY, Disp: 90 tablet, Rfl: 0 .  levothyroxine (SYNTHROID) 100 MCG tablet, Take 1 tablet (100 mcg total) by mouth daily., Disp: 90 tablet, Rfl: 3 .  LORazepam (ATIVAN) 0.5 MG tablet, Take 1 tablet by mouth at bedtime as needed., Disp: , Rfl:  .  lubiprostone (AMITIZA) 24 MCG capsule, TAKE 1 CAPSULE TWICE A DAY WITH MEALS., Disp: 60 capsule, Rfl: 0 .  Multiple Vitamin (MULTIVITAMIN WITH MINERALS) TABS, Take 1 tablet by mouth daily., Disp: , Rfl:  .  nitroGLYCERIN (NITROSTAT) 0.4 MG SL tablet, Place 0.4 mg under the tongue every 5 (five) minutes as needed. For chest pain, Disp: , Rfl:  .  ondansetron (ZOFRAN-ODT) 4 MG disintegrating tablet, TAKE 1 TABLET EVERY 8 HOURS AS NEEDED FOR NAUSEA AND VOMITING, Disp: 20 tablet, Rfl: 0 .  pantoprazole (PROTONIX) 40 MG tablet, Take 1 tablet (40 mg total) by mouth daily., Disp: 90 tablet, Rfl: 3 .  rosuvastatin (CRESTOR) 10 MG tablet, Take 10 mg by mouth daily., Disp: , Rfl:  .  spironolactone (ALDACTONE) 25 MG tablet, Take 25 mg by mouth daily. , Disp: , Rfl:  .  SYMBICORT 80-4.5 MCG/ACT inhaler, Inhale 2 puffs into the lungs 2 (two) times daily as needed., Disp: , Rfl:  .  torsemide (DEMADEX) 20 MG tablet, Take 2 tablets (40 mg total) by mouth 2 (two) times daily., Disp: 180 tablet, Rfl: 2 .  traZODone (DESYREL) 50 MG tablet, TAKE 1/2 TO 1 TABLET AT BEDTIME AS NEEDED FOR SLEEP, Disp: 30 tablet, Rfl: 3 Social History   Socioeconomic History  . Marital status:  Married    Spouse name: Not on file  . Number of children: Not on file  . Years of education: Not on file  . Highest education level: Not on file  Occupational History  . Not on file  Tobacco Use  . Smoking status: Former Research scientist (life sciences)  . Smokeless tobacco: Never Used  . Tobacco comment: quit smoking 20+yrs ago  Vaping Use  . Vaping Use: Never used  Substance and Sexual Activity  . Alcohol use: No    Comment: last drank about 2-3 years ago  . Drug use: No  . Sexual  activity: Yes  Other Topics Concern  . Not on file  Social History Narrative   ** Merged History Encounter **       Social Determinants of Health   Financial Resource Strain: Low Risk   . Difficulty of Paying Living Expenses: Not very hard  Food Insecurity: No Food Insecurity  . Worried About Charity fundraiser in the Last Year: Never true  . Ran Out of Food in the Last Year: Never true  Transportation Needs: Unmet Transportation Needs  . Lack of Transportation (Medical): No  . Lack of Transportation (Non-Medical): Yes  Physical Activity: Inactive  . Days of Exercise per Week: 0 days  . Minutes of Exercise per Session: 0 min  Stress: Stress Concern Present  . Feeling of Stress : To some extent  Social Connections: Moderately Isolated  . Frequency of Communication with Friends and Family: Twice a week  . Frequency of Social Gatherings with Friends and Family: Twice a week  . Attends Religious Services: Never  . Active Member of Clubs or Organizations: No  . Attends Archivist Meetings: Never  . Marital Status: Married  Human resources officer Violence: Not At Risk  . Fear of Current or Ex-Partner: No  . Emotionally Abused: No  . Physically Abused: No  . Sexually Abused: No   Family History  Problem Relation Age of Onset  . Cancer Mother        Unknown type  . Heart disease Father   . Diabetes Father   . Heart disease Sister   . Seizures Brother   . Diabetes Daughter   . Diabetes Daughter   . Heart  attack Son   . Colon cancer Mother        in her 63s.   . Stomach cancer Neg Hx   . Esophageal cancer Neg Hx     Objective: Office vital signs reviewed. BP 116/61   Pulse 73   Temp 98 F (36.7 C) (Temporal)   Ht _0  (1.727 m)   Wt 182 lb (82.6 kg)   SpO2 94%   BMI 27.67 kg/m   Physical Examination:  General: Awake, alert, chronically ill appearing, No acute distress HEENT: Normal; sclera white Cardio: regular rate and rhythm. S1S2 heard, soft systolic murmur appreciated Pulm: clear to auscultation bilaterally, no wheezes, rhonchi or rales; normal work of breathing on room air GI: Continues have a Tube in place in the right upper quadrant.  There is no drainage. Psych: Patient is pleasant and interactive.  He is playful.  Does not appear to be responding to internal stimuli. Neuro: See MMSE score below. Depression screen [facility] 2/9 02/04/2020 11/04/2019 07/26/2019  Decreased Interest 1 0 -  Down, Depressed, Hopeless _1 PHQ - 2 Score _2 Altered sleeping 2 3 -  Tired, decreased energy 1 3 -  Change in appetite 2 3 -  Feeling bad or failure about yourself  1 3 -  Trouble concentrating 2 3 -  Moving slowly or fidgety/restless 1 3 -  Suicidal thoughts 0 0 -  PHQ-9 Score 12 19 -  Difficult doing work/chores - Extremely dIfficult -   GAD 7 : Generalized Anxiety Score 02/04/2020 11/04/2019 05/13/2019 10/28/2018  Nervous, Anxious, on Edge _3 Control/stop worrying 3 0 1 3  Worry too much - different things 2 0 1 3  Trouble relaxing _4 0  Restless _5 Easily annoyed or  irritable 3 3 0 3  Afraid - awful might happen 2 3 0 0  Total GAD 7 Score _0 Anxiety Difficulty - Extremely difficult Somewhat difficult Not difficult at all    MMSE - Mini Mental State Exam 02/04/2020  Orientation to time 5  Orientation to Place 5  Registration 3  Attention/ Calculation 3  Recall 0  Language- name 2 objects 2  Language- repeat 1  Language- follow 3 step command  3  Language- read & follow direction 0  Write a sentence 0  Copy design 1  Total score 23   Assessment/ Plan: 79 y.o. male   1. Type 2 diabetes mellitus with stage 3a chronic kidney disease, without long-term current use of insulin ([place]) Despite reports of sugar improving at home his A1c has gone up to 8.8 from his check in July.  I am advancing his Wilder Glade to 10 mg daily.  He will continue taking Januvia 50 mg.  Check renal function, urine microalbumin, CBC given CKD 3 and recent anemia noted on CBC - Microalbumin / creatinine urine ratio - Bayer DCA Hb A1c Waived - CMP14+EGFR - CBC - dapagliflozin propanediol (FARXIGA) 10 MG TABS tablet; Take 1 tablet (10 mg total) by mouth daily before breakfast.  Dispense: 30 tablet; Refill: 3  2. Hyperlipidemia associated with type 2 diabetes mellitus (HCC) Continue statin - CMP14+EGFR  3. Hypertension associated with diabetes ([place]) Controlled  4. Encounter for hepatitis C screening test for low risk patient - Hepatitis C antibody  5. Anxiety and depression Add buspirone to Lexapro.  He will advanced as below.  We will follow-up in 4 weeks - busPIRone (BUSPAR) 5 MG tablet; Take 1 tablet (5 mg total) by mouth 2 (two) times daily for 7 days, THEN 1.5 tablets (7.5 mg total) 2 (two) times daily for 7 days, THEN 2 tablets (10 mg total) 2 (two) times daily for 14 days.  Dispense: 91 tablet; Refill: 0  6. Abnormal MMSE He did have a low MMSE.  His score was 23 today.  We will likely consider starting Namenda or Aricept at our next visit.  I would like to repeat the MMSE pending his response to the BuSpar.  No orders of the defined types were placed in this encounter.  No orders of the defined types were placed in this encounter.    [name], DO [place] [phone]

## 2020-02-04 NOTE — Patient Instructions (Addendum)
Farxiga increased to 10mg  daily.  New prescription sent.  Increase buspirone each week as directed IF needed for anxiety/ depression.  Keep taking the escitalopram daily as directed  See me in 4 weeks for mood (phone is fine)  You had labs performed today.  You will be contacted with the results of the labs once they are available, usually in the next 3 business days for routine lab work.  If you have an active my chart account, they will be released to your MyChart.  If you prefer to have these labs released to you via telephone, please let us know.  If you had a pap smear or biopsy performed, expect to be contacted in about 7-10 days.

## 2020-02-05 LAB — CMP14+EGFR
ALT: 12 IU/L (ref 0–44)
AST: 15 IU/L (ref 0–40)
Albumin/Globulin Ratio: 1.4 (ref 1.2–2.2)
Albumin: 4.4 g/dL (ref 3.7–4.7)
Alkaline Phosphatase: 103 IU/L (ref 44–121)
BUN/Creatinine Ratio: 18 (ref 10–24)
BUN: 31 mg/dL — ABNORMAL HIGH (ref 8–27)
Bilirubin Total: 0.5 mg/dL (ref 0.0–1.2)
CO2: 28 mmol/L (ref 20–29)
Calcium: 9.1 mg/dL (ref 8.6–10.2)
Chloride: 94 mmol/L — ABNORMAL LOW (ref 96–106)
Creatinine, Ser: 1.71 mg/dL — ABNORMAL HIGH (ref 0.76–1.27)
GFR calc Af Amer: 43 mL/min/{1.73_m2} — ABNORMAL LOW (ref >59)
GFR calc non Af Amer: 37 mL/min/{1.73_m2} — ABNORMAL LOW (ref >59)
Globulin, Total: 3.2 g/dL (ref 1.5–4.5)
Glucose: 265 mg/dL — ABNORMAL HIGH (ref 65–99)
Potassium: 4.8 mmol/L (ref 3.5–5.2)
Sodium: 138 mmol/L (ref 134–144)
Total Protein: 7.6 g/dL (ref 6.0–8.5)

## 2020-02-05 LAB — HEPATITIS C ANTIBODY: Hep C Virus Ab: <0.1 s/co ratio (ref 0.0–0.9)

## 2020-02-05 LAB — CBC
Hematocrit: 41 % (ref 37.5–51.0)
Hemoglobin: 13.3 g/dL (ref 13.0–17.7)
MCH: 31.5 pg (ref 26.6–33.0)
MCHC: 32.4 g/dL (ref 31.5–35.7)
MCV: 97 fL (ref 79–97)
Platelets: 172 10*3/uL (ref 150–450)
RBC: 4.22 x10E6/uL (ref 4.14–5.80)
RDW: 14.4 % (ref 11.6–15.4)
WBC: 8.5 10*3/uL (ref 3.4–10.8)

## 2020-02-05 LAB — MICROALBUMIN / CREATININE URINE RATIO
Creatinine, Urine: 27.6 mg/dL
Microalb/Creat Ratio: 30 mg/g creat — ABNORMAL HIGH (ref 0–29)
Microalbumin, Urine: 8.4 ug/mL

## 2020-02-15 DIAGNOSIS — Z961 Presence of intraocular lens: Secondary | ICD-10-CM | POA: Diagnosis not present

## 2020-02-15 DIAGNOSIS — H2511 Age-related nuclear cataract, right eye: Secondary | ICD-10-CM | POA: Diagnosis not present

## 2020-02-15 DIAGNOSIS — E119 Type 2 diabetes mellitus without complications: Secondary | ICD-10-CM | POA: Diagnosis not present

## 2020-02-15 DIAGNOSIS — H40013 Open angle with borderline findings, low risk, bilateral: Secondary | ICD-10-CM | POA: Diagnosis not present

## 2020-02-15 LAB — HM DIABETES EYE EXAM

## 2020-02-23 ENCOUNTER — Other Ambulatory Visit: Payer: Self-pay | Admitting: Family Medicine

## 2020-02-23 DIAGNOSIS — K5909 Other constipation: Secondary | ICD-10-CM

## 2020-02-23 DIAGNOSIS — F5101 Primary insomnia: Secondary | ICD-10-CM

## 2020-02-23 NOTE — Telephone Encounter (Signed)
Not sure that he is supposed to be on the furosemide anymore since he is on the Demadex.  Patient check with cardiologist to ensure which medication he should be using

## 2020-02-28 DIAGNOSIS — I509 Heart failure, unspecified: Secondary | ICD-10-CM | POA: Diagnosis not present

## 2020-03-01 DIAGNOSIS — K819 Cholecystitis, unspecified: Secondary | ICD-10-CM | POA: Diagnosis not present

## 2020-03-01 DIAGNOSIS — I4891 Unspecified atrial fibrillation: Secondary | ICD-10-CM | POA: Diagnosis not present

## 2020-03-01 DIAGNOSIS — Z4682 Encounter for fitting and adjustment of non-vascular catheter: Secondary | ICD-10-CM | POA: Diagnosis not present

## 2020-03-01 DIAGNOSIS — Z9889 Other specified postprocedural states: Secondary | ICD-10-CM | POA: Diagnosis not present

## 2020-03-01 DIAGNOSIS — K810 Acute cholecystitis: Secondary | ICD-10-CM | POA: Diagnosis not present

## 2020-03-03 DIAGNOSIS — J449 Chronic obstructive pulmonary disease, unspecified: Secondary | ICD-10-CM | POA: Diagnosis not present

## 2020-03-06 ENCOUNTER — Ambulatory Visit: Payer: Medicare Other | Admitting: Family Medicine

## 2020-03-07 ENCOUNTER — Encounter: Payer: Self-pay | Admitting: Family Medicine

## 2020-03-20 DIAGNOSIS — Z4682 Encounter for fitting and adjustment of non-vascular catheter: Secondary | ICD-10-CM | POA: Diagnosis not present

## 2020-03-25 ENCOUNTER — Other Ambulatory Visit: Payer: Self-pay | Admitting: Family Medicine

## 2020-03-25 DIAGNOSIS — D500 Iron deficiency anemia secondary to blood loss (chronic): Secondary | ICD-10-CM

## 2020-03-25 DIAGNOSIS — K5909 Other constipation: Secondary | ICD-10-CM

## 2020-03-29 DIAGNOSIS — I509 Heart failure, unspecified: Secondary | ICD-10-CM | POA: Diagnosis not present

## 2020-03-29 DIAGNOSIS — I350 Nonrheumatic aortic (valve) stenosis: Secondary | ICD-10-CM | POA: Diagnosis not present

## 2020-03-29 DIAGNOSIS — Z45018 Encounter for adjustment and management of other part of cardiac pacemaker: Secondary | ICD-10-CM | POA: Diagnosis not present

## 2020-03-30 DIAGNOSIS — Z45018 Encounter for adjustment and management of other part of cardiac pacemaker: Secondary | ICD-10-CM | POA: Diagnosis not present

## 2020-03-30 DIAGNOSIS — I5032 Chronic diastolic (congestive) heart failure: Secondary | ICD-10-CM | POA: Diagnosis not present

## 2020-04-02 DIAGNOSIS — J449 Chronic obstructive pulmonary disease, unspecified: Secondary | ICD-10-CM | POA: Diagnosis not present

## 2020-04-22 ENCOUNTER — Other Ambulatory Visit: Payer: Self-pay | Admitting: Family Medicine

## 2020-04-27 DIAGNOSIS — Z1159 Encounter for screening for other viral diseases: Secondary | ICD-10-CM | POA: Diagnosis not present

## 2020-04-29 DIAGNOSIS — I509 Heart failure, unspecified: Secondary | ICD-10-CM | POA: Diagnosis not present

## 2020-05-03 DIAGNOSIS — J449 Chronic obstructive pulmonary disease, unspecified: Secondary | ICD-10-CM | POA: Diagnosis not present

## 2020-05-19 ENCOUNTER — Other Ambulatory Visit: Payer: Self-pay | Admitting: Family Medicine

## 2020-05-19 DIAGNOSIS — K5909 Other constipation: Secondary | ICD-10-CM

## 2020-05-19 DIAGNOSIS — I5033 Acute on chronic diastolic (congestive) heart failure: Secondary | ICD-10-CM

## 2020-05-19 DIAGNOSIS — E1122 Type 2 diabetes mellitus with diabetic chronic kidney disease: Secondary | ICD-10-CM

## 2020-05-30 ENCOUNTER — Other Ambulatory Visit: Payer: Self-pay

## 2020-05-30 ENCOUNTER — Ambulatory Visit (INDEPENDENT_AMBULATORY_CARE_PROVIDER_SITE_OTHER): Payer: Medicare Other | Admitting: Family Medicine

## 2020-05-30 ENCOUNTER — Encounter: Payer: Self-pay | Admitting: Family Medicine

## 2020-05-30 VITALS — BP 122/72 | HR 76 | Temp 97.9°F | Ht 68.0 in | Wt 187.0 lb

## 2020-05-30 DIAGNOSIS — R350 Frequency of micturition: Secondary | ICD-10-CM | POA: Diagnosis not present

## 2020-05-30 DIAGNOSIS — E1122 Type 2 diabetes mellitus with diabetic chronic kidney disease: Secondary | ICD-10-CM | POA: Diagnosis not present

## 2020-05-30 DIAGNOSIS — N1831 Chronic kidney disease, stage 3a: Secondary | ICD-10-CM | POA: Diagnosis not present

## 2020-05-30 DIAGNOSIS — E039 Hypothyroidism, unspecified: Secondary | ICD-10-CM

## 2020-05-30 DIAGNOSIS — E1165 Type 2 diabetes mellitus with hyperglycemia: Secondary | ICD-10-CM

## 2020-05-30 DIAGNOSIS — L723 Sebaceous cyst: Secondary | ICD-10-CM | POA: Diagnosis not present

## 2020-05-30 DIAGNOSIS — N1832 Chronic kidney disease, stage 3b: Secondary | ICD-10-CM

## 2020-05-30 DIAGNOSIS — I509 Heart failure, unspecified: Secondary | ICD-10-CM | POA: Diagnosis not present

## 2020-05-30 LAB — URINALYSIS
Bilirubin, UA: NEGATIVE
Ketones, UA: NEGATIVE
Leukocytes,UA: NEGATIVE
Nitrite, UA: NEGATIVE
Protein,UA: NEGATIVE
RBC, UA: NEGATIVE
Specific Gravity, UA: <1.005 — ABNORMAL LOW (ref 1.005–1.030)
Urobilinogen, Ur: 0.2 mg/dL (ref 0.2–1.0)
pH, UA: 5 (ref 5.0–7.5)

## 2020-05-30 LAB — BAYER DCA HB A1C WAIVED: HB A1C (BAYER DCA - WAIVED): 8.2 % — ABNORMAL HIGH (ref <7.0)

## 2020-05-30 MED ORDER — GLIPIZIDE 5 MG PO TABS
5.0000 mg | ORAL_TABLET | Freq: Two times a day (BID) | ORAL | 3 refills | Status: DC
Start: 2020-05-30 — End: 2020-06-27

## 2020-05-30 NOTE — Progress Notes (Signed)
Subjective: CC: DM PCP: [name], DO JKD:TOIZ D Lobello is a 80 y.o. male presenting to clinic today for:  1. Type 2 Diabetes with hypertension, hyperlipidemia:  He is brought to the office by his wife who reads off his blood sugars which have been in the 200s most days.  He is compliant with Wilder Glade, reduced dose of Januvia.  He admits that it is hard for him to taste foods and really the only thing he tastes and enjoys are foods from diabetes and fast food restaurants.  His wife does cook but he often will only take a few bites before pushing it away.  He denies early satiety.  Last eye exam: Up-to-date Last foot exam: Up-to-date Last A1c:  Lab Results  Component Value Date   HGBA1C 8.8 (H) 02/04/2020   Nephropathy screen indicated?:  Up-to-date Last flu, zoster and/or pneumovax:  Immunization History  Administered Date(s) Administered  . Fluad Quad(high Dose 65+) 02/04/2020  . Influenza, High Dose Seasonal PF 02/29/2016, 01/20/2017, 02/10/2018  . Influenza, Seasonal, Injecte, Preservative Fre 02/03/2014  . Influenza,inj,Quad PF,6+ Mos 01/01/2019  . Influenza-Unspecified 02/03/2014, 02/29/2016, 01/20/2017, 02/10/2018  . Moderna Sars-Covid-2 Vaccination 07/05/2019, 08/02/2019, 02/23/2020  . Pneumococcal Conjugate-13 11/25/2014, 11/25/2014  . Pneumococcal Polysaccharide-23 05/30/2016, 05/30/2016, 01/01/2019  . Tdap 11/25/2014, 11/25/2014    No chest pain.  They keep up with his weights and typically weight is around 181 pounds.  He is continued on all medications by his heart doctor.  2. Hypothyroidism Compliant with Synthroid.  No reports of heart palpitations or tremor  3.  Knot on shoulder Patient has been seen by dermatology twice for spot on right posterior shoulder.  His wife notes that this had turned into a boil at one point and was treated but has subsequently left a knot.  He denies any pain   ROS: Per HPI  Allergies  Allergen Reactions  . Codeine  Nausea And Vomiting  . Codeine   . Latex Hives and Itching  . Lisinopril   . Lisinopril Cough   Past Medical History:  Diagnosis Date  . AAA (abdominal aortic aneurysm) without rupture ([place])    repaired  . Abdominal aortic aneurysm without rupture ([place])   . Anemia   . Anxiety and depression   . Aortic stenosis   . 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[place])   . CHF (congestive heart failure) ([place])   . Cholecystitis   . Chronic ischemic heart disease   . Complication of anesthesia    hard to be put to sleep  . COPD (chronic obstructive pulmonary disease) ([place])   . Coronary artery disease   . Diabetes mellitus without complication ([place])   . Diabetes mellitus without complication ([place])    takes Januvia and Metformin daily  . GERD (gastroesophageal reflux disease)   . GERD (gastroesophageal reflux disease)    takes omeprazole daily  . Headache(784.0)    sinus  . History of bronchitis    last time >51yrs ago  . Hyperlipidemia    takes Lipitor daily  . Hypertension   . Hypertension    takes Metoprolol daily  . Joint pain   . Myocardial infarction ([place])    x 3;last one about 3-87yrs ago  . Neoplasm of uncertain behavior of bladder   . Obesity   . OSA (obstructive sleep apnea)   . Pacemaker   . Pancytopenia ([place])   . Paroxysmal atrial fibrillation (HCC)   . Pneumonia    last itme about 28yrs ago  .  S/P TAVR (transcatheter aortic valve replacement)   . Severe aortic stenosis   . Sinus bradycardia     Current Outpatient Medications:  .  albuterol (PROVENTIL) (2.5 MG/3ML) 0.083% nebulizer solution, Take 2.5 mg by nebulization every 6 (six) hours as needed for wheezing or shortness of breath., Disp: , Rfl:  .  albuterol (VENTOLIN HFA) 108 (90 Base) MCG/ACT inhaler, Inhale 2 puffs into the lungs every 6 (six) hours as needed for wheezing or shortness of breath., Disp: 18 g, Rfl: 3 .  aspirin EC 81 MG tablet, Take 81 mg by mouth daily., Disp: , Rfl:  .  calcium carbonate (OSCAL) 1500 (600  Ca) MG TABS tablet, Take by mouth 2 (two) times daily with a meal., Disp: , Rfl:  .  clopidogrel (PLAVIX) 75 MG tablet, TAKE ONE (1) TABLET EACH DAY, Disp: 90 tablet, Rfl: 0 .  escitalopram (LEXAPRO) 10 MG tablet, TAKE ONE (1) TABLET EACH DAY, Disp: 90 tablet, Rfl: 0 .  FARXIGA 10 MG TABS tablet, TAKE ONE TABLET EACH MORNING BEFORE BREAKFAST, Disp: 30 tablet, Rfl: 3 .  ferrous sulfate 325 (65 FE) MG EC tablet, Take 325 mg by mouth every Monday, Wednesday, and Friday. , Disp: , Rfl:  .  JANUVIA 50 MG tablet, TAKE ONE (1) TABLET EACH DAY, Disp: 90 tablet, Rfl: 0 .  levothyroxine (SYNTHROID) 100 MCG tablet, Take 1 tablet (100 mcg total) by mouth daily., Disp: 90 tablet, Rfl: 3 .  lubiprostone (AMITIZA) 24 MCG capsule, TAKE 1 CAPSULE TWICE A DAY WITH MEALS., Disp: 60 capsule, Rfl: 1 .  Multiple Vitamin (MULTIVITAMIN WITH MINERALS) TABS, Take 1 tablet by mouth daily., Disp: , Rfl:  .  nitroGLYCERIN (NITROSTAT) 0.4 MG SL tablet, Place 0.4 mg under the tongue every 5 (five) minutes as needed. For chest pain, Disp: , Rfl:  .  ondansetron (ZOFRAN-ODT) 4 MG disintegrating tablet, TAKE 1 TABLET EVERY 8 HOURS AS NEEDED FOR NAUSEA AND VOMITING, Disp: 20 tablet, Rfl: 0 .  pantoprazole (PROTONIX) 40 MG tablet, TAKE ONE (1) TABLET EACH DAY, Disp: 90 tablet, Rfl: 0 .  rosuvastatin (CRESTOR) 10 MG tablet, Take 10 mg by mouth daily., Disp: , Rfl:  .  SYMBICORT 80-4.5 MCG/ACT inhaler, Inhale 2 puffs into the lungs 2 (two) times daily as needed., Disp: , Rfl:  .  torsemide (DEMADEX) 20 MG tablet, TAKE TWO TABLETS BY MOUTH TWICE DAILY, Disp: 120 tablet, Rfl: 0 .  traZODone (DESYREL) 50 MG tablet, TAKE 1/2 TO 1 TABLET AT BEDTIME AS NEEDED FOR SLEEP, Disp: 30 tablet, Rfl: 3 .  acetaZOLAMIDE (DIAMOX) 250 MG tablet, Take by mouth., Disp: , Rfl:  .  spironolactone (ALDACTONE) 25 MG tablet, Take 25 mg by mouth daily. , Disp: , Rfl:  Social History   Socioeconomic History  . Marital status: Married    Spouse name: Not on  file  . Number of children: Not on file  . Years of education: Not on file  . Highest education level: Not on file  Occupational History  . Not on file  Tobacco Use  . Smoking status: Former Research scientist (life sciences)  . Smokeless tobacco: Never Used  . Tobacco comment: quit smoking 20+yrs ago  Vaping Use  . Vaping Use: Never used  Substance and Sexual Activity  . Alcohol use: No    Comment: last drank about 2-3 years ago  . Drug use: No  . Sexual activity: Yes  Other Topics Concern  . Not on file  Social History Narrative   **  Merged History Encounter **       Social Determinants of Health   Financial Resource Strain: Not on file  Food Insecurity: Not on file  Transportation Needs: Not on file  Physical Activity: Not on file  Stress: Not on file  Social Connections: Not on file  Intimate Partner Violence: Not on file   Family History  Problem Relation Age of Onset  . Cancer Mother        Unknown type  . Heart disease Father   . Diabetes Father   . Heart disease Sister   . Seizures Brother   . Diabetes Daughter   . Diabetes Daughter   . Heart attack Son   . Colon cancer Mother        in her 55s.   . Stomach cancer Neg Hx   . Esophageal cancer Neg Hx     Objective: Office vital signs reviewed. BP 122/72   Pulse 76   Temp 97.9 F (36.6 C) (Temporal)   Ht 5\' 8"  (1.727 m)   Wt 187 lb (84.8 kg)   SpO2 94%   BMI 28.43 kg/m   Physical Examination:  General: Awake, alert, No acute distress HEENT: Normal; sclera white Cardio: regular rate and rhythm, S1S2 heard, no murmurs appreciated Pulm: Mild expiratory wheeze noted in the right upper lung.  Otherwise clear to auscultation bilaterally with normal work of breathing on room air Skin: Nickel sized area that is hard in texture.  Its well-circumscribed and mobile.  There is a central punctum noted in the right upper back. MSK: Ambulates independently  Assessment/ Plan: 80 y.o. male   Uncontrolled type 2 diabetes mellitus  with hyperglycemia ([place]) - Plan: Bayer DCA Hb A1c Waived, Renal Function Panel, CBC, glipiZIDE (GLUCOTROL) 5 MG tablet  Stage 3b chronic kidney disease ([place]) - Plan: Renal Function Panel, CBC  Sebaceous cyst  Urinary frequency - Plan: Urinalysis  Acquired hypothyroidism - Plan: Thyroid Panel With TSH  Sugars uncontrolled with A1c rising to 8.2.  We have identified that he does tend to eat out because these foods are the "only foods that he can really taste".  However, I have counseled the patient at length today on need for carb reduction and to avoid salt laden foods like what he gets at fast food restaurants.  We discussed increasing vegetables, fresh foods and lean meats.  His wife already limits salt in her cooking so I think overall this would be more helpful for the patient.  We discussed adding things like tomatoes and lemons to improve taste.  I have added glipizide 5 mg twice daily.  He will continue on low-dose Januvia, Farxiga.  We discussed that really the neck step is insulin if we are unable to get his sugars under control.  He is very adamant about not wanting to be on a needle but this is something we will need to seriously consider if his sugar remains uncontrolled.  Check renal function panel, CBC  Lesion on back is what appears to be a scarred down sebaceous cyst.  No active infection  Urinary frequency likely due to uncontrolled blood sugars and use of Farxiga and diuretic.  Check thyroid panel  Orders Placed This Encounter  Procedures  . Bayer DCA Hb A1c Waived  . Renal Function Panel  . CBC   No orders of the defined types were placed in this encounter.    [name], DO [place] [phone]

## 2020-05-30 NOTE — Patient Instructions (Signed)
Start Glipizide for sugar.  If it works, we will plan to increase at next visit.  If sugar does not respond, we really will have to discuss insulin as next step.  CUT OUT DEBBIES and fast food.  He needs to be eating LOW carb (no bread, pasta, rice, potatoes, sweets, sodas, etc).  Veggies, lean meats is what he should be eating.   Epidermoid Cyst  An epidermoid cyst, also called an epidermal cyst, is a small lump under your skin. The cyst contains a substance called keratin. Do not try to pop or open the cyst yourself. What are the causes?  A blocked hair follicle.  A hair that curls and re-enters the skin instead of growing straight out of the skin.  A blocked pore.  Irritated skin.  An injury to the skin.  Certain conditions that are passed along from parent to child.  Human papillomavirus (HPV). This happens rarely when cysts occur on the bottom of the feet.  Long-term sun damage to the skin. What increases the risk?  Having acne.  Being male.  Having an injury to the skin.  Being past puberty.  Having certain conditions caused by genes (genetic disorder) What are the signs or symptoms? These cysts are usually harmless, but they can get infected. Symptoms of infection may include:  Redness.  Inflammation.  Tenderness.  Warmth.  Fever.  A bad-smelling substance that drains from the cyst.  Pus that drains from the cyst. How is this treated? In many cases, epidermoid cysts go away on their own without treatment. If a cyst becomes infected, treatment may include:  Opening and draining the cyst, done by a doctor. After draining, you may need minor surgery to remove the rest of the cyst.  Antibiotic medicine.  Shots of medicines (steroids) that help to reduce inflammation.  Surgery to remove the cyst. Surgery may be done if the cyst: ? Becomes large. ? Bothers you. ? Has a chance of turning into cancer.  Do not try to open a cyst yourself. Follow  these instructions at home: Medicines  Take over-the-counter and prescription medicines as told by your doctor.  If you were prescribed an antibiotic medicine, take it as told by your doctor. Do not stop taking it even if you start to feel better. General instructions  Keep the area around your cyst clean and dry.  Wear loose, dry clothing.  Avoid touching your cyst.  Check your cyst every day for signs of infection. Check for: ? Redness, swelling, or pain. ? Fluid or blood. ? Warmth. ? Pus or a bad smell.  Keep all follow-up visits. How is this prevented?  Wear clean, dry, clothing.  Avoid wearing tight clothing.  Keep your skin clean and dry. Take showers or baths every day. Contact a doctor if:  Your cyst has symptoms of infection.  Your condition does not improve or gets worse.  You have a cyst that looks different from other cysts you have had.  You have a fever. Get help right away if:  Redness spreads from the cyst into the area close by. Summary  An epidermoid cyst is a small lump under your skin.  If a cyst becomes infected, treatment may include surgery to open and drain the cyst, or to remove it.  Take over-the-counter and prescription medicines only as told by your doctor.  Contact a doctor if your condition is not improving or is getting worse.  Keep all follow-up visits. This information is not intended  to replace advice given to you by your health care provider. Make sure you discuss any questions you have with your health care provider. Document Revised: 07/07/2019 Document Reviewed: 07/07/2019 Elsevier Patient Education  [place].

## 2020-05-31 LAB — CBC
Hematocrit: 46.9 % (ref 37.5–51.0)
Hemoglobin: 14.8 g/dL (ref 13.0–17.7)
MCH: 30.7 pg (ref 26.6–33.0)
MCHC: 31.6 g/dL (ref 31.5–35.7)
MCV: 97 fL (ref 79–97)
Platelets: 155 10*3/uL (ref 150–450)
RBC: 4.82 x10E6/uL (ref 4.14–5.80)
RDW: 14.9 % (ref 11.6–15.4)
WBC: 9.3 10*3/uL (ref 3.4–10.8)

## 2020-05-31 LAB — RENAL FUNCTION PANEL
Albumin: 4.7 g/dL (ref 3.7–4.7)
BUN/Creatinine Ratio: 26 — ABNORMAL HIGH (ref 10–24)
BUN: 43 mg/dL — ABNORMAL HIGH (ref 8–27)
CO2: 21 mmol/L (ref 20–29)
Calcium: 9.5 mg/dL (ref 8.6–10.2)
Chloride: 96 mmol/L (ref 96–106)
Creatinine, Ser: 1.67 mg/dL — ABNORMAL HIGH (ref 0.76–1.27)
GFR calc Af Amer: 44 mL/min/{1.73_m2} — ABNORMAL LOW (ref >59)
GFR calc non Af Amer: 38 mL/min/{1.73_m2} — ABNORMAL LOW (ref >59)
Glucose: 269 mg/dL — ABNORMAL HIGH (ref 65–99)
Phosphorus: 3.9 mg/dL (ref 2.8–4.1)
Potassium: 4.7 mmol/L (ref 3.5–5.2)
Sodium: 137 mmol/L (ref 134–144)

## 2020-05-31 LAB — THYROID PANEL WITH TSH
Free Thyroxine Index: 2.2 (ref 1.2–4.9)
T3 Uptake Ratio: 32 % (ref 24–39)
T4, Total: 6.8 ug/dL (ref 4.5–12.0)
TSH: 1.86 u[IU]/mL (ref 0.450–4.500)

## 2020-05-31 LAB — SPECIMEN STATUS REPORT

## 2020-06-03 DIAGNOSIS — J449 Chronic obstructive pulmonary disease, unspecified: Secondary | ICD-10-CM | POA: Diagnosis not present

## 2020-06-06 ENCOUNTER — Telehealth: Payer: Self-pay

## 2020-06-06 NOTE — Telephone Encounter (Signed)
Lmtcb.

## 2020-06-06 NOTE — Telephone Encounter (Signed)
Please have him contact his cardiologist ASAP for diuretic instructions.

## 2020-06-06 NOTE — Telephone Encounter (Signed)
Caryl Pina RN from [facility] ([place]) is calling in about patient condition--she says that she spoke to him this morning--he has had a 4lbs weight gain and swelling in stomach. She asked that a nurse contact him to follow up. She provided contact number [phone]. Please call back

## 2020-06-07 NOTE — Telephone Encounter (Signed)
Patient aware and verbalized understanding. States he lost it over night and if he needs something he will call them and [place] to let us know.

## 2020-06-07 NOTE — Telephone Encounter (Signed)
Lmtcb.

## 2020-06-13 ENCOUNTER — Telehealth: Payer: Self-pay | Admitting: *Deleted

## 2020-06-13 NOTE — Telephone Encounter (Signed)
Only other option is insulin.  Glad to see him back for that to get it started.

## 2020-06-13 NOTE — Telephone Encounter (Signed)
[facility] nurse called in today after talking with pt. He states that he uses to take Glipizide and it was stopped b/c it caused a cough. It was restarted on OV 05/30/20. He has developed the cough again.   He is also back on plavix - fyi  He is aware to not stop the med without your input, but nurse wanted you aware.

## 2020-06-14 ENCOUNTER — Other Ambulatory Visit: Payer: Self-pay | Admitting: Family Medicine

## 2020-06-14 ENCOUNTER — Other Ambulatory Visit: Payer: Self-pay | Admitting: Family

## 2020-06-14 DIAGNOSIS — D500 Iron deficiency anemia secondary to blood loss (chronic): Secondary | ICD-10-CM

## 2020-06-14 DIAGNOSIS — F5101 Primary insomnia: Secondary | ICD-10-CM

## 2020-06-14 DIAGNOSIS — I5033 Acute on chronic diastolic (congestive) heart failure: Secondary | ICD-10-CM

## 2020-06-14 NOTE — Telephone Encounter (Signed)
Spoke with patient, appointment scheduled with Dr. Lajuana Ripple on 06/21/20.

## 2020-06-21 ENCOUNTER — Ambulatory Visit: Payer: Medicare Other | Admitting: Family Medicine

## 2020-06-21 ENCOUNTER — Encounter: Payer: Self-pay | Admitting: Family Medicine

## 2020-06-21 NOTE — Progress Notes (Deleted)
Subjective: CC: Insulin start PCP: [name], DO DPO:EUMP D Hofman is a 80 y.o. male presenting to clinic today for:  1. Uncontrolled diabetes Had intolerance to glipizide due to cough. Compliant with [place] and Januvia.  Here to start insulin to replace glipizide as BG was uncontrolled last visit with A1c 8.2. ***   ROS: Per HPI  Allergies  Allergen Reactions  . Codeine Nausea And Vomiting  . Codeine   . Latex Hives and Itching  . Lisinopril   . Lisinopril Cough   Past Medical History:  Diagnosis Date  . AAA (abdominal aortic aneurysm) without rupture ([place])    repaired  . Abdominal aortic aneurysm without rupture ([place])   . Anemia   . Anxiety and depression   . Aortic stenosis   . 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[place])   . CHF (congestive heart failure) ([place])   . Cholecystitis   . Chronic ischemic heart disease   . Complication of anesthesia    hard to be put to sleep  . COPD (chronic obstructive pulmonary disease) ([place])   . Coronary artery disease   . Diabetes mellitus without complication ([place])   . Diabetes mellitus without complication ([place])    takes Januvia and Metformin daily  . GERD (gastroesophageal reflux disease)   . GERD (gastroesophageal reflux disease)    takes omeprazole daily  . Headache(784.0)    sinus  . History of bronchitis    last time >73yrs ago  . Hyperlipidemia    takes Lipitor daily  . Hypertension   . Hypertension    takes Metoprolol daily  . Joint pain   . Myocardial infarction ([place])    x 3;last one about 3-47yrs ago  . Neoplasm of uncertain behavior of bladder   . Obesity   . OSA (obstructive sleep apnea)   . Pacemaker   . Pancytopenia ([place])   . Paroxysmal atrial fibrillation (HCC)   . Pneumonia    last itme about 25yrs ago  . S/P TAVR (transcatheter aortic valve replacement)   . Severe aortic stenosis   . Sinus bradycardia     Current Outpatient Medications:  .  acetaZOLAMIDE (DIAMOX) 250 MG tablet, Take by mouth.,  Disp: , Rfl:  .  albuterol (PROVENTIL) (2.5 MG/3ML) 0.083% nebulizer solution, Take 2.5 mg by nebulization every 6 (six) hours as needed for wheezing or shortness of breath., Disp: , Rfl:  .  albuterol (VENTOLIN HFA) 108 (90 Base) MCG/ACT inhaler, Inhale 2 puffs into the lungs every 6 (six) hours as needed for wheezing or shortness of breath., Disp: 18 g, Rfl: 3 .  aspirin EC 81 MG tablet, Take 81 mg by mouth daily., Disp: , Rfl:  .  calcium carbonate (OSCAL) 1500 (600 Ca) MG TABS tablet, Take by mouth 2 (two) times daily with a meal., Disp: , Rfl:  .  clopidogrel (PLAVIX) 75 MG tablet, TAKE ONE (1) TABLET EACH DAY, Disp: 90 tablet, Rfl: 0 .  escitalopram (LEXAPRO) 10 MG tablet, TAKE ONE (1) TABLET EACH DAY, Disp: 90 tablet, Rfl: 0 .  FARXIGA 10 MG TABS tablet, TAKE ONE TABLET EACH MORNING BEFORE BREAKFAST, Disp: 30 tablet, Rfl: 3 .  ferrous sulfate 325 (65 FE) MG EC tablet, Take 325 mg by mouth every Monday, Wednesday, and Friday. , Disp: , Rfl:  .  glipiZIDE (GLUCOTROL) 5 MG tablet, Take 1 tablet (5 mg total) by mouth 2 (two) times daily before a meal., Disp: 60 tablet, Rfl: 3 .  JANUVIA 50 MG tablet,  TAKE ONE (1) TABLET EACH DAY, Disp: 90 tablet, Rfl: 0 .  levothyroxine (SYNTHROID) 100 MCG tablet, TAKE ONE (1) TABLET EACH DAY, Disp: 90 tablet, Rfl: 3 .  lubiprostone (AMITIZA) 24 MCG capsule, TAKE 1 CAPSULE TWICE A DAY WITH MEALS., Disp: 60 capsule, Rfl: 1 .  Multiple Vitamin (MULTIVITAMIN WITH MINERALS) TABS, Take 1 tablet by mouth daily., Disp: , Rfl:  .  nitroGLYCERIN (NITROSTAT) 0.4 MG SL tablet, Place 0.4 mg under the tongue every 5 (five) minutes as needed. For chest pain, Disp: , Rfl:  .  ondansetron (ZOFRAN-ODT) 4 MG disintegrating tablet, TAKE 1 TABLET EVERY 8 HOURS AS NEEDED FOR NAUSEA AND VOMITING, Disp: 20 tablet, Rfl: 0 .  pantoprazole (PROTONIX) 40 MG tablet, TAKE ONE (1) TABLET EACH DAY, Disp: 90 tablet, Rfl: 0 .  rosuvastatin (CRESTOR) 10 MG tablet, Take 10 mg by mouth daily.,  Disp: , Rfl:  .  spironolactone (ALDACTONE) 25 MG tablet, Take 25 mg by mouth daily. , Disp: , Rfl:  .  SYMBICORT 80-4.5 MCG/ACT inhaler, Inhale 2 puffs into the lungs 2 (two) times daily as needed., Disp: , Rfl:  .  torsemide (DEMADEX) 20 MG tablet, Take 2 tablets (40 mg total) by mouth 2 (two) times daily. Needs OV with cardiology., Disp: 120 tablet, Rfl: 0 .  traZODone (DESYREL) 50 MG tablet, TAKE 1/2 TO 1 TABLET AT BEDTIME AS NEEDED FOR SLEEP, Disp: 30 tablet, Rfl: 3 Social History   Socioeconomic History  . Marital status: Married    Spouse name: Not on file  . Number of children: Not on file  . Years of education: Not on file  . Highest education level: Not on file  Occupational History  . Not on file  Tobacco Use  . Smoking status: Former Research scientist (life sciences)  . Smokeless tobacco: Never Used  . Tobacco comment: quit smoking 20+yrs ago  Vaping Use  . Vaping Use: Never used  Substance and Sexual Activity  . Alcohol use: No    Comment: last drank about 2-3 years ago  . Drug use: No  . Sexual activity: Yes  Other Topics Concern  . Not on file  Social History Narrative   ** Merged History Encounter **       Social Determinants of Health   Financial Resource Strain: Not on file  Food Insecurity: Not on file  Transportation Needs: Not on file  Physical Activity: Not on file  Stress: Not on file  Social Connections: Not on file  Intimate Partner Violence: Not on file   Family History  Problem Relation Age of Onset  . Cancer Mother        Unknown type  . Heart disease Father   . Diabetes Father   . Heart disease Sister   . Seizures Brother   . Diabetes Daughter   . Diabetes Daughter   . Heart attack Son   . Colon cancer Mother        in her 66s.   . Stomach cancer Neg Hx   . Esophageal cancer Neg Hx     Objective: Office vital signs reviewed. There were no vitals taken for this visit.  Physical Examination:  General: Awake, alert, *** nourished, No acute  distress HEENT: Normal    Neck: No masses palpated. No lymphadenopathy    Ears: Tympanic membranes intact, normal light reflex, no erythema, no bulging    Eyes: PERRLA, extraocular membranes intact, sclera ***    Nose: nasal turbinates moist, *** nasal discharge  Throat: moist mucus membranes, no erythema, *** tonsillar exudate.  Airway is patent Cardio: regular rate and rhythm, S1S2 heard, no murmurs appreciated Pulm: clear to auscultation bilaterally, no wheezes, rhonchi or rales; normal work of breathing on room air GI: soft, non-tender, non-distended, bowel sounds present x4, no hepatomegaly, no splenomegaly, no masses GU: external vaginal tissue ***, cervix ***, *** punctate lesions on cervix appreciated, *** discharge from cervical os, *** bleeding, *** cervical motion tenderness, *** abdominal/ adnexal masses Extremities: warm, well perfused, No edema, cyanosis or clubbing; +*** pulses bilaterally MSK: *** gait and *** station Skin: dry; intact; no rashes or lesions Neuro: *** Strength and light touch sensation grossly intact, *** DTRs ***/4  Assessment/ Plan: 80 y.o. male   ***  No orders of the defined types were placed in this encounter.  No orders of the defined types were placed in this encounter.    [name], DO [place] [phone]

## 2020-06-27 ENCOUNTER — Other Ambulatory Visit: Payer: Self-pay

## 2020-06-27 ENCOUNTER — Ambulatory Visit (INDEPENDENT_AMBULATORY_CARE_PROVIDER_SITE_OTHER): Payer: Medicare Other | Admitting: Family Medicine

## 2020-06-27 VITALS — BP 112/71 | HR 70 | Temp 97.7°F | Ht 68.0 in | Wt 196.0 lb

## 2020-06-27 DIAGNOSIS — E1165 Type 2 diabetes mellitus with hyperglycemia: Secondary | ICD-10-CM

## 2020-06-27 MED ORDER — NOVOFINE PLUS PEN NEEDLE 32G X 4 MM MISC: 3 refills | Status: DC

## 2020-06-27 MED ORDER — TRESIBA FLEXTOUCH 100 UNIT/ML ~~LOC~~ SOPN: 5.0000 [IU] | PEN_INJECTOR | Freq: Every day | SUBCUTANEOUS | 0 refills | Status: DC

## 2020-06-27 NOTE — Progress Notes (Signed)
Subjective: CC: uncontrolled DM PCP: [name], DO HEN:IDPO D Peyton is a 80 y.o. male presenting to clinic today for:  1. DM Patient noted to have uncontrolled DM at last visit.  Glipizide 5 mg twice daily was added but unfortunately has had intolerance to this medication the past due to severe cough.  He is reduce it to 5 mg daily only and still has some cough but the cough has improved.  Unfortunately his blood sugars have risen quite a bit and are consistently in the 170s to 190s.  He is compliant with [place] and Januvia, both of which are renally adjusted.  Here to discuss initiation of insulin to replace glipizide and potentially Januvia.   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   Abdominal aortic aneurysm without rupture (HCC)    Anemia    Anxiety and depression    Aortic stenosis    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HCC)    CHF (congestive heart failure) (HCC)    Cholecystitis    Chronic ischemic heart disease    Complication of anesthesia    hard to be put to sleep   COPD (chronic obstructive pulmonary disease) ([place])    Coronary artery disease    Diabetes mellitus without complication ([place])    Diabetes mellitus without complication ([place])    takes Januvia and Metformin daily   GERD (gastroesophageal reflux disease)    GERD (gastroesophageal reflux disease)    takes omeprazole daily   Headache(784.0)    sinus   History of bronchitis    last time >5yrs ago   Hyperlipidemia    takes Lipitor daily   Hypertension    Hypertension    takes Metoprolol daily   Joint pain    Myocardial infarction ([place])    x 3;last one about 3-103yrs ago   Neoplasm of uncertain behavior of bladder    Obesity    OSA (obstructive sleep  apnea)    Pacemaker    Pancytopenia (HCC)    Paroxysmal atrial fibrillation (HCC)    Pneumonia    last itme about 7yrs ago   S/P TAVR (transcatheter aortic valve replacement)    Severe aortic stenosis    Sinus bradycardia     Current Outpatient Medications:    albuterol (PROVENTIL) (2.5 MG/3ML) 0.083% nebulizer solution, Take 2.5 mg by nebulization every 6 (six) hours as needed for wheezing or shortness of breath., Disp: , Rfl:    albuterol (VENTOLIN HFA) 108 (90 Base) MCG/ACT inhaler, Inhale 2 puffs into the lungs every 6 (six) hours as needed for wheezing or shortness of breath., Disp: 18 g, Rfl: 3   aspirin EC 81 MG tablet, Take 81 mg by mouth daily., Disp: , Rfl:    calcium carbonate (OSCAL) 1500 (600 Ca) MG TABS tablet, Take by mouth 2 (two) times daily with a meal., Disp: , Rfl:    clopidogrel (PLAVIX) 75 MG tablet, TAKE ONE (1) TABLET EACH DAY, Disp: 90 tablet, Rfl: 0   escitalopram (LEXAPRO) 10 MG tablet, TAKE ONE (1) TABLET EACH DAY, Disp: 90 tablet, Rfl: 0   FARXIGA 10 MG TABS tablet, TAKE ONE TABLET EACH MORNING BEFORE BREAKFAST, Disp: 30 tablet, Rfl: 3   ferrous sulfate 325 (65 FE)  MG EC tablet, Take 325 mg by mouth every Monday, Wednesday, and Friday. , Disp: , Rfl:    glipiZIDE (GLUCOTROL) 5 MG tablet, Take 1 tablet (5 mg total) by mouth 2 (two) times daily before a meal., Disp: 60 tablet, Rfl: 3   JANUVIA 50 MG tablet, TAKE ONE (1) TABLET EACH DAY, Disp: 90 tablet, Rfl: 0   levothyroxine (SYNTHROID) 100 MCG tablet, TAKE ONE (1) TABLET EACH DAY, Disp: 90 tablet, Rfl: 3   lubiprostone (AMITIZA) 24 MCG capsule, TAKE 1 CAPSULE TWICE A DAY WITH MEALS., Disp: 60 capsule, Rfl: 1   Multiple Vitamin (MULTIVITAMIN WITH MINERALS) TABS, Take 1 tablet by mouth daily., Disp: , Rfl:    nitroGLYCERIN (NITROSTAT) 0.4 MG SL tablet, Place 0.4 mg under the tongue every 5 (five) minutes as needed. For chest pain, Disp: , Rfl:    ondansetron (ZOFRAN-ODT) 4 MG disintegrating  tablet, TAKE 1 TABLET EVERY 8 HOURS AS NEEDED FOR NAUSEA AND VOMITING, Disp: 20 tablet, Rfl: 0   pantoprazole (PROTONIX) 40 MG tablet, TAKE ONE (1) TABLET EACH DAY, Disp: 90 tablet, Rfl: 0   rosuvastatin (CRESTOR) 10 MG tablet, Take 10 mg by mouth daily., Disp: , Rfl:    SYMBICORT 80-4.5 MCG/ACT inhaler, Inhale 2 puffs into the lungs 2 (two) times daily as needed., Disp: , Rfl:    torsemide (DEMADEX) 20 MG tablet, Take 2 tablets (40 mg total) by mouth 2 (two) times daily. Needs OV with cardiology., Disp: 120 tablet, Rfl: 0   traZODone (DESYREL) 50 MG tablet, TAKE 1/2 TO 1 TABLET AT BEDTIME AS NEEDED FOR SLEEP, Disp: 30 tablet, Rfl: 3   acetaZOLAMIDE (DIAMOX) 250 MG tablet, Take by mouth., Disp: , Rfl:    spironolactone (ALDACTONE) 25 MG tablet, Take 25 mg by mouth daily. , Disp: , Rfl:  Social History   Socioeconomic History   Marital status: Married    Spouse name: Not on file   Number of children: Not on file   Years of education: Not on file   Highest education level: Not on file  Occupational History   Not on file  Tobacco Use   Smoking status: Former Smoker   Smokeless tobacco: Never Used   Tobacco comment: quit smoking 20+yrs ago  Vaping Use   Vaping Use: Never used  Substance and Sexual Activity   Alcohol use: No    Comment: last drank about 2-3 years ago   Drug use: No   Sexual activity: Yes  Other Topics Concern   Not on file  Social History Narrative   ** Merged History Encounter **       Social Determinants of Health   Financial Resource Strain: Not on file  Food Insecurity: Not on file  Transportation Needs: Not on file  Physical Activity: Not on file  Stress: Not on file  Social Connections: Not on file  Intimate Partner Violence: Not on file   Family History  Problem Relation Age of Onset   Cancer Mother        Unknown type   Heart disease Father    Diabetes Father    Heart disease Sister    Seizures Brother    Diabetes  Daughter    Diabetes Daughter    Heart attack Son    Colon cancer Mother        in her 67s.    Stomach cancer Neg Hx    Esophageal cancer Neg Hx     Objective: Office vital signs reviewed. BP 112/71  Pulse 70    Temp 97.7 F (36.5 C) (Temporal)    Ht 5\' 8"  (1.727 m)    Wt 196 lb (88.9 kg)    SpO2 92%    BMI 29.80 kg/m   Physical Examination:  General: Awake, alert, No acute distress Cardio: regular rate  Pulm: normal WOB on room air  Assessment/ Plan: 80 y.o. male   Uncontrolled type 2 diabetes mellitus with hyperglycemia ([place]) - Plan: insulin degludec (TRESIBA FLEXTOUCH) 100 UNIT/ML FlexTouch Pen, Insulin Pen Needle (NOVOFINE PLUS PEN NEEDLE) 32G X 4 MM MISC  I have shown him the device and Ashley, LPN, has gone over how to do a proper injection.  I have reviewed at length how to gradually titrate by 1 unit every 2 days for fasting blood sugars above 150.  A 1 month sample has been provided to the patient and I will send over pen needles.  His wife will contact me in 1 week to discuss where he is at with his dosing and then we will subsequently send a formal prescription to the pharmacy.  He has been directed to discontinue Januvia and glipizide so as to avoid hypoglycemia.  Transient hyperglycemia is expected but he is to contact me if any blood sugars are above 400.  No orders of the defined types were placed in this encounter.  No orders of the defined types were placed in this encounter.    [name], DO [place] [phone]

## 2020-06-27 NOTE — Patient Instructions (Addendum)
STOP Glipizide and Januvia REPLACE with NIGHTLY tresiba.  Sample provided today.  See Almyra Free in 1 month for adjustment of insulin and patient assistance  If your FASTING blood sugar is above 150 for 2 consecutive days, increase by 1 unit of your long acting insulin.  Example: Day 1: Blood sugar was 159 Day 2: Blood sugar was 205  Increase Tresiba to 6 units Day 3: Blood sugar is 202 Day 4: Blood sugars 168  Increase Tresiba to 7units Day 5: Blood sugar is 105 Day 6: Blood sugars 145  Stick with 7 units.  Do not increase.

## 2020-07-01 DIAGNOSIS — J449 Chronic obstructive pulmonary disease, unspecified: Secondary | ICD-10-CM | POA: Diagnosis not present

## 2020-07-03 DIAGNOSIS — Z45018 Encounter for adjustment and management of other part of cardiac pacemaker: Secondary | ICD-10-CM | POA: Diagnosis not present

## 2020-07-04 ENCOUNTER — Other Ambulatory Visit: Payer: Self-pay | Admitting: Family Medicine

## 2020-07-04 ENCOUNTER — Telehealth: Payer: Self-pay

## 2020-07-04 DIAGNOSIS — I5032 Chronic diastolic (congestive) heart failure: Secondary | ICD-10-CM | POA: Diagnosis not present

## 2020-07-04 DIAGNOSIS — E1165 Type 2 diabetes mellitus with hyperglycemia: Secondary | ICD-10-CM

## 2020-07-04 DIAGNOSIS — Z45018 Encounter for adjustment and management of other part of cardiac pacemaker: Secondary | ICD-10-CM | POA: Diagnosis not present

## 2020-07-04 MED ORDER — TRESIBA FLEXTOUCH 100 UNIT/ML ~~LOC~~ SOPN: 5.0000 [IU] | PEN_INJECTOR | Freq: Every day | SUBCUTANEOUS | 6 refills | Status: DC

## 2020-07-04 NOTE — Telephone Encounter (Signed)
As we discussed he is post to increase his insulin by 1 unit every 2 days for fasting morning blood sugar above 150.  We are gradually titrating him up on this medication. There are literally no other medications that he can go on given his renal dysfunction and intolerance to glipizide.  The insulin will eventually work for him but we cannot just jump to the high dose because he has the potential for having a hypoglycemic episode. Continue titrating as we discussed by 1 unit every 2 days if morning blood sugars are above 150 for 2 days in a row. Not sure how he is on 9.5 units.  This does not make sense.

## 2020-07-04 NOTE — Telephone Encounter (Signed)
Pt called - discussed at length. [facility]  He will fax over request for this - would not go through system.

## 2020-07-04 NOTE — Telephone Encounter (Signed)
I spoke to pt's wife and she says he is taking Tresiba 9.5 units at 7pm.   FBS are between 188-250 and he isn't eating much of anything. She thinks he was doing better on the oral medications. She couldn't give me exact FBS readings as she didn't write all of them down.   Please advise.

## 2020-07-11 DIAGNOSIS — I482 Chronic atrial fibrillation, unspecified: Secondary | ICD-10-CM | POA: Diagnosis not present

## 2020-07-11 DIAGNOSIS — I259 Chronic ischemic heart disease, unspecified: Secondary | ICD-10-CM | POA: Diagnosis not present

## 2020-07-11 DIAGNOSIS — I480 Paroxysmal atrial fibrillation: Secondary | ICD-10-CM | POA: Diagnosis not present

## 2020-07-11 DIAGNOSIS — Z79899 Other long term (current) drug therapy: Secondary | ICD-10-CM | POA: Diagnosis not present

## 2020-07-11 DIAGNOSIS — Z955 Presence of coronary angioplasty implant and graft: Secondary | ICD-10-CM | POA: Diagnosis not present

## 2020-07-11 DIAGNOSIS — Z885 Allergy status to narcotic agent status: Secondary | ICD-10-CM | POA: Diagnosis not present

## 2020-07-11 DIAGNOSIS — Z91040 Latex allergy status: Secondary | ICD-10-CM | POA: Diagnosis not present

## 2020-07-11 DIAGNOSIS — I714 Abdominal aortic aneurysm, without rupture: Secondary | ICD-10-CM | POA: Diagnosis not present

## 2020-07-11 DIAGNOSIS — Z87891 Personal history of nicotine dependence: Secondary | ICD-10-CM | POA: Diagnosis not present

## 2020-07-11 DIAGNOSIS — I4891 Unspecified atrial fibrillation: Secondary | ICD-10-CM | POA: Diagnosis not present

## 2020-07-11 DIAGNOSIS — I25110 Atherosclerotic heart disease of native coronary artery with unstable angina pectoris: Secondary | ICD-10-CM | POA: Diagnosis not present

## 2020-07-11 DIAGNOSIS — I080 Rheumatic disorders of both mitral and aortic valves: Secondary | ICD-10-CM | POA: Diagnosis not present

## 2020-07-11 DIAGNOSIS — Z888 Allergy status to other drugs, medicaments and biological substances status: Secondary | ICD-10-CM | POA: Diagnosis not present

## 2020-07-11 DIAGNOSIS — Z950 Presence of cardiac pacemaker: Secondary | ICD-10-CM | POA: Diagnosis not present

## 2020-07-14 ENCOUNTER — Other Ambulatory Visit: Payer: Self-pay | Admitting: Family Medicine

## 2020-07-14 ENCOUNTER — Other Ambulatory Visit: Payer: Self-pay | Admitting: Family

## 2020-07-14 DIAGNOSIS — I5033 Acute on chronic diastolic (congestive) heart failure: Secondary | ICD-10-CM

## 2020-07-14 DIAGNOSIS — K5909 Other constipation: Secondary | ICD-10-CM

## 2020-07-26 ENCOUNTER — Ambulatory Visit (INDEPENDENT_AMBULATORY_CARE_PROVIDER_SITE_OTHER): Payer: Medicare Other

## 2020-07-26 VITALS — Ht 68.0 in | Wt 196.0 lb

## 2020-07-26 DIAGNOSIS — Z0000 Encounter for general adult medical examination without abnormal findings: Secondary | ICD-10-CM | POA: Diagnosis not present

## 2020-07-26 NOTE — Patient Instructions (Signed)
[name] , Thank you for taking time to come for your Medicare Wellness Visit. I appreciate your ongoing commitment to your health goals. Please review the following plan we discussed and let me know if I can assist you in the future.   Screening recommendations/referrals: Colonoscopy: No longer required Recommended yearly ophthalmology/optometry visit for glaucoma screening and checkup Recommended yearly dental visit for hygiene and checkup  Vaccinations: Influenza vaccine: Done 02/04/2020 Pneumococcal vaccine: Done 11/25/2014 & 01/01/2019 Tdap vaccine: Done 11/25/2014 Shingles vaccine: Shingrix discussed. Please contact your pharmacy for coverage information.    Covid-19: Complete: 07/05/19, 08/02/19 & 02/23/2020  Advanced directives: Advance directive discussed with you today. I have provided a copy for you to complete at home and have notarized. Once this is complete please bring a copy in to our office so we can scan it into your chart.  Conditions/risks identified: Continue fall prevention. Try the strength and balance exercises. Call us and ask about physical therapy to help with balance if home exercises don't help.  Next appointment: Follow up in one year for your annual wellness visit.   Preventive Care 27 Years and Older, Male  Preventive care refers to lifestyle choices and visits with your health care provider that can promote health and wellness. What does preventive care include?  A yearly physical exam. This is also called an annual well check.  Dental exams once or twice a year.  Routine eye exams. Ask your health care provider how often you should have your eyes checked.  Personal lifestyle choices, including:  Daily care of your teeth and gums.  Regular physical activity.  Eating a healthy diet.  Avoiding tobacco and drug use.  Limiting alcohol use.  Practicing safe sex.  Taking low doses of aspirin every day.  Taking vitamin and mineral supplements  as recommended by your health care provider. What happens during an annual well check? The services and screenings done by your health care provider during your annual well check will depend on your age, overall health, lifestyle risk factors, and family history of disease. Counseling  Your health care provider may ask you questions about your:  Alcohol use.  Tobacco use.  Drug use.  Emotional well-being.  Home and relationship well-being.  Sexual activity.  Eating habits.  History of falls.  Memory and ability to understand (cognition).  Work and work Statistician. Screening  You may have the following tests or measurements:  Height, weight, and BMI.  Blood pressure.  Lipid and cholesterol levels. These may be checked every 5 years, or more frequently if you are over 33 years old.  Skin check.  Lung cancer screening. You may have this screening every year starting at age 34 if you have a 30-pack-year history of smoking and currently smoke or have quit within the past 15 years.  Fecal occult blood test (FOBT) of the stool. You may have this test every year starting at age 67.  Flexible sigmoidoscopy or colonoscopy. You may have a sigmoidoscopy every 5 years or a colonoscopy every 10 years starting at age 37.  Prostate cancer screening. Recommendations will vary depending on your family history and other risks.  Hepatitis C blood test.  Hepatitis B blood test.  Sexually transmitted disease (STD) testing.  Diabetes screening. This is done by checking your blood sugar (glucose) after you have not eaten for a while (fasting). You may have this done every 1-3 years.  Abdominal aortic aneurysm (AAA) screening. You may need this if you are a  current or former smoker.  Osteoporosis. You may be screened starting at age 16 if you are at high risk. Talk with your health care provider about your test results, treatment options, and if necessary, the need for more  tests. Vaccines  Your health care provider may recommend certain vaccines, such as:  Influenza vaccine. This is recommended every year.  Tetanus, diphtheria, and acellular pertussis (Tdap, Td) vaccine. You may need a Td booster every 10 years.  Zoster vaccine. You may need this after age 42.  Pneumococcal 13-valent conjugate (PCV13) vaccine. One dose is recommended after age 29.  Pneumococcal polysaccharide (PPSV23) vaccine. One dose is recommended after age 72. Talk to your health care provider about which screenings and vaccines you need and how often you need them. This information is not intended to replace advice given to you by your health care provider. Make sure you discuss any questions you have with your health care provider. Document Released: 04/28/2015 Document Revised: 12/20/2015 Document Reviewed: 01/31/2015 Elsevier Interactive Patient Education  2017 [employer] Prevention in the Home Falls can cause injuries. They can happen to people of all ages. There are many things you can do to make your home safe and to help prevent falls. What can I do on the outside of my home?  Regularly fix the edges of walkways and driveways and fix any cracks.  Remove anything that might make you trip as you walk through a door, such as a raised step or threshold.  Trim any bushes or trees on the path to your home.  Use bright outdoor lighting.  Clear any walking paths of anything that might make someone trip, such as rocks or tools.  Regularly check to see if handrails are loose or broken. Make sure that both sides of any steps have handrails.  Any raised decks and porches should have guardrails on the edges.  Have any leaves, snow, or ice cleared regularly.  Use sand or salt on walking paths during winter.  Clean up any spills in your garage right away. This includes oil or grease spills. What can I do in the bathroom?  Use night lights.  Install grab bars by the  toilet and in the tub and shower. Do not use towel bars as grab bars.  Use non-skid mats or decals in the tub or shower.  If you need to sit down in the shower, use a plastic, non-slip stool.  Keep the floor dry. Clean up any water that spills on the floor as soon as it happens.  Remove soap buildup in the tub or shower regularly.  Attach bath mats securely with double-sided non-slip rug tape.  Do not have throw rugs and other things on the floor that can make you trip. What can I do in the bedroom?  Use night lights.  Make sure that you have a light by your bed that is easy to reach.  Do not use any sheets or blankets that are too big for your bed. They should not hang down onto the floor.  Have a firm chair that has side arms. You can use this for support while you get dressed.  Do not have throw rugs and other things on the floor that can make you trip. What can I do in the kitchen?  Clean up any spills right away.  Avoid walking on wet floors.  Keep items that you use a lot in easy-to-reach places.  If you need to reach something above  you, use a strong step stool that has a grab bar.  Keep electrical cords out of the way.  Do not use floor polish or wax that makes floors slippery. If you must use wax, use non-skid floor wax.  Do not have throw rugs and other things on the floor that can make you trip. What can I do with my stairs?  Do not leave any items on the stairs.  Make sure that there are handrails on both sides of the stairs and use them. Fix handrails that are broken or loose. Make sure that handrails are as long as the stairways.  Check any carpeting to make sure that it is firmly attached to the stairs. Fix any carpet that is loose or worn.  Avoid having throw rugs at the top or bottom of the stairs. If you do have throw rugs, attach them to the floor with carpet tape.  Make sure that you have a light switch at the top of the stairs and the bottom of  the stairs. If you do not have them, ask someone to add them for you. What else can I do to help prevent falls?  Wear shoes that:  Do not have high heels.  Have rubber bottoms.  Are comfortable and fit you well.  Are closed at the toe. Do not wear sandals.  If you use a stepladder:  Make sure that it is fully opened. Do not climb a closed stepladder.  Make sure that both sides of the stepladder are locked into place.  Ask someone to hold it for you, if possible.  Clearly mark and make sure that you can see:  Any grab bars or handrails.  First and last steps.  Where the edge of each step is.  Use tools that help you move around (mobility aids) if they are needed. These include:  Canes.  Walkers.  Scooters.  Crutches.  Turn on the lights when you go into a dark area. Replace any light bulbs as soon as they burn out.  Set up your furniture so you have a clear path. Avoid moving your furniture around.  If any of your floors are uneven, fix them.  If there are any pets around you, be aware of where they are.  Review your medicines with your doctor. Some medicines can make you feel dizzy. This can increase your chance of falling. Ask your doctor what other things that you can do to help prevent falls. This information is not intended to replace advice given to you by your health care provider. Make sure you discuss any questions you have with your health care provider. Document Released: 01/26/2009 Document Revised: 09/07/2015 Document Reviewed: 05/06/2014 Elsevier Interactive Patient Education  2017 [employer].

## 2020-07-26 NOTE — Progress Notes (Signed)
Subjective:   Matthew Campos is a 80 y.o. male who presents for Medicare Annual/Subsequent preventive examination.  Virtual Visit via Telephone Note  I connected with  Matthew Campos on 07/26/20 at 11:15 AM EDT by telephone and verified that I am speaking with the correct person using two identifiers.  Location: Patient: Home Provider: [facility] Persons participating in the virtual visit: patient/Nurse Health Advisor   I discussed the limitations, risks, security and privacy concerns of performing an evaluation and management service by telephone and the availability of in person appointments. The patient expressed understanding and agreed to proceed.  Interactive audio and video telecommunications were attempted between this nurse and patient, however failed, due to patient having technical difficulties OR patient did not have access to video capability.  We continued and completed visit with audio only.  Some vital signs may be absent or patient reported.   Matthew Campos E Matthew Pavao, LPN   Review of Systems     Cardiac Risk Factors include: advanced age (>12men, >46 women);diabetes mellitus;dyslipidemia;hypertension;male gender;sedentary lifestyle     Objective:    Today's Vitals   07/26/20 1055 07/26/20 1056  Weight: 196 lb (88.9 kg)   Height: 5\' 8"  (1.727 m)   PainSc:  8    Body mass index is 29.8 kg/m.  Advanced Directives 07/26/2020 09/07/2019 07/26/2019 04/17/2019 04/17/2019 04/15/2019 04/13/2019  Does Patient Have a Medical Advance Directive? No No Yes No Yes Yes No  Type of Advance Directive - - Living will - - - -  Does patient want to make changes to medical advance directive? - - No - Guardian declined No - Patient declined No - Patient declined - -  Would patient like information on creating a medical advance directive? Yes (MAU/Ambulatory/Procedural Areas - Information given) No - Patient declined - No - Patient declined No - Patient declined - No - Patient declined   Pre-existing out of facility DNR order (yellow form or pink MOST form) - - - - - - -    Current Medications (verified) Outpatient Encounter Medications as of 07/26/2020  Medication Sig  . albuterol (PROVENTIL) (2.5 MG/3ML) 0.083% nebulizer solution Take 2.5 mg by nebulization every 6 (six) hours as needed for wheezing or shortness of breath.  [name] albuterol (VENTOLIN HFA) 108 (90 Base) MCG/ACT inhaler Inhale 2 puffs into the lungs every 6 (six) hours as needed for wheezing or shortness of breath.  [name] aspirin EC 81 MG tablet Take 81 mg by mouth daily.  . calcium carbonate (OSCAL) 1500 (600 Ca) MG TABS tablet Take by mouth 2 (two) times daily with a meal.  . clopidogrel (PLAVIX) 75 MG tablet TAKE ONE (1) TABLET EACH DAY  . FARXIGA 10 MG TABS tablet TAKE ONE TABLET EACH MORNING BEFORE BREAKFAST  . ferrous sulfate 325 (65 FE) MG EC tablet Take 325 mg by mouth every Monday, Wednesday, and Friday.   [name] glipiZIDE (GLUCOTROL) 5 MG tablet Take 5 mg by mouth 2 (two) times daily.  . Insulin Pen Needle (NOVOFINE PLUS PEN NEEDLE) 32G X 4 MM MISC UAD to inject Tresiba nightly. E11.65  . JANUVIA 100 MG tablet Take by mouth.  . levothyroxine (SYNTHROID) 100 MCG tablet TAKE ONE (1) TABLET EACH DAY  . lubiprostone (AMITIZA) 24 MCG capsule TAKE 1 CAPSULE TWICE A DAY WITH MEALS.  . Multiple Vitamin (MULTIVITAMIN WITH MINERALS) TABS Take 1 tablet by mouth daily.  . nitroGLYCERIN (NITROSTAT) 0.4 MG SL tablet Place 0.4 mg under the tongue every 5 (five) minutes  as needed. For chest pain  . ondansetron (ZOFRAN-ODT) 4 MG disintegrating tablet TAKE 1 TABLET EVERY 8 HOURS AS NEEDED FOR NAUSEA AND VOMITING  . pantoprazole (PROTONIX) 40 MG tablet TAKE ONE (1) TABLET EACH DAY  . rosuvastatin (CRESTOR) 10 MG tablet Take 10 mg by mouth daily.  . SYMBICORT 80-4.5 MCG/ACT inhaler Inhale 2 puffs into the lungs 2 (two) times daily as needed.  . torsemide (DEMADEX) 20 MG tablet TAKE TWO TABLETS BY MOUTH TWICE DAILY  . traZODone  (DESYREL) 50 MG tablet TAKE 1/2 TO 1 TABLET AT BEDTIME AS NEEDED FOR SLEEP  . [DISCONTINUED] losartan (COZAAR) 100 MG tablet Hold until follow-up with primary care doctor.  Previous dose 100 mg daily.  [name] acetaZOLAMIDE (DIAMOX) 250 MG tablet Take by mouth.  . escitalopram (LEXAPRO) 10 MG tablet TAKE ONE (1) TABLET EACH DAY  . sodium chloride (OCEAN) 0.65 % nasal spray Place into the nose. (Patient not taking: Reported on 07/26/2020)  . spironolactone (ALDACTONE) 25 MG tablet Take 25 mg by mouth daily.   Matthew Campos FLEXTOUCH 100 UNIT/ML FlexTouch Pen INJECT 5-10 UNITS SQ DAILY (Patient not taking: Reported on 07/26/2020)  . [DISCONTINUED] calcium carbonate (TUMS - DOSED IN MG ELEMENTAL CALCIUM) 500 MG chewable tablet Chew by mouth.  . [DISCONTINUED] furosemide (LASIX) 80 MG tablet Take by mouth. (Patient not taking: Reported on 07/26/2020)  . [DISCONTINUED] Omeprazole 20 MG TBEC Take by mouth.   No facility-administered encounter medications on file as of 07/26/2020.    Allergies (verified) Codeine, Codeine, Latex, Lisinopril, and Lisinopril   History: Past Medical History:  Diagnosis Date  . AAA (abdominal aortic aneurysm) without rupture ([place])    repaired  . Abdominal aortic aneurysm without rupture ([place])   . Anemia   . Anxiety and depression   . Aortic stenosis   . 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[place])   . CHF (congestive heart failure) ([place])   . Cholecystitis   . Chronic ischemic heart disease   . Complication of anesthesia    hard to be put to sleep  . COPD (chronic obstructive pulmonary disease) ([place])   . Coronary artery disease   . Diabetes mellitus without complication ([place])   . Diabetes mellitus without complication ([place])    takes Januvia and Metformin daily  . GERD (gastroesophageal reflux disease)   . GERD (gastroesophageal reflux disease)    takes omeprazole daily  . Headache(784.0)    sinus  . History of bronchitis    last time >55yrs ago  . Hyperlipidemia    takes Lipitor  daily  . Hypertension   . Hypertension    takes Metoprolol daily  . Joint pain   . Myocardial infarction ([place])    x 3;last one about 3-80yrs ago  . Neoplasm of uncertain behavior of bladder   . Obesity   . OSA (obstructive sleep apnea)   . Pacemaker   . Pancytopenia ([place])   . Paroxysmal atrial fibrillation ([facility])   . Pneumonia    last itme about 46yrs ago  . S/P TAVR (transcatheter aortic valve replacement)   . Severe aortic stenosis   . Sinus bradycardia    Past Surgical History:  Procedure Laterality Date  . abdominal aneurysm stenting    . ABDOMINAL AORTIC ANEURYSM REPAIR     per patient stents placed about 4-5 years ago  . AORTIC VALVE REPLACEMENT     stated it was done in November  . cholecystostomy tube    . CORONARY ANGIOPLASTY  2010  .  CORONARY ANGIOPLASTY WITH STENT PLACEMENT     about 30 years ago per patient  . cyst removed from left wrist    . ESOPHAGOGASTRODUODENOSCOPY (EGD) WITH PROPOFOL N/A 04/15/2019   Procedure: ESOPHAGOGASTRODUODENOSCOPY (EGD) WITH PROPOFOL;  Surgeon: Danie Binder, MD;  [facility];  Service: Endoscopy;  Laterality: N/A;  possible dilation  . HERNIA REPAIR    . left shoulder surgery    . POLYPECTOMY  04/15/2019   Procedure: POLYPECTOMY;  Surgeon: Danie Binder, MD;  [facility];  Service: Endoscopy;;  duodenal   . SHOULDER ARTHROSCOPY WITH ROTATOR CUFF REPAIR AND SUBACROMIAL DECOMPRESSION  04/17/2012   Procedure: SHOULDER ARTHROSCOPY WITH ROTATOR CUFF REPAIR AND SUBACROMIAL DECOMPRESSION;  Surgeon: Yvette Rack., MD;  Location: [place];  Service: Orthopedics;  Laterality: Right;  RIGHT SHOULDER ROTATOR CUFF REPAIR INCLUDING ACROMIOPLASTY CHRONIC, ARTHROSCOPY SHOULDER DEBRIDEMENT EXTENSIVE  . TONSILLECTOMY    . VALVE REPLACEMENT     Family History  Problem Relation Age of Onset  . Cancer Mother        Unknown type  . Heart disease Father   . Diabetes Father   . Heart disease Sister   . Seizures Brother   .  Diabetes Daughter   . Diabetes Daughter   . Heart attack Son   . Colon cancer Mother        in her 30s.   . Stomach cancer Neg Hx   . Esophageal cancer Neg Hx    Social History   Socioeconomic History  . Marital status: Married    Spouse name: Not on file  . Number of children: Not on file  . Years of education: Not on file  . Highest education level: Not on file  Occupational History  . Occupation: retired  Tobacco Use  . Smoking status: Former Research scientist (life sciences)  . Smokeless tobacco: Never Used  . Tobacco comment: quit smoking 20+yrs ago  Vaping Use  . Vaping Use: Never used  Substance and Sexual Activity  . Alcohol use: No    Comment: last drank about 2-3 years ago  . Drug use: No  . Sexual activity: Yes  Other Topics Concern  . Not on file  Social History Narrative   Lives with his wife - their grandson lives with them at times       Social Determinants of Health   Financial Resource Strain: Low Risk   . Difficulty of Paying Living Expenses: Not very hard  Food Insecurity: No Food Insecurity  . Worried About Charity fundraiser in the Last Year: Never true  . Ran Out of Food in the Last Year: Never true  Transportation Needs: No Transportation Needs  . Lack of Transportation (Medical): No  . Lack of Transportation (Non-Medical): No  Physical Activity: Insufficiently Active  . Days of Exercise per Week: 7 days  . Minutes of Exercise per Session: 20 min  Stress: Stress Concern Present  . Feeling of Stress : To some extent  Social Connections: Moderately Isolated  . Frequency of Communication with Friends and Family: Twice a week  . Frequency of Social Gatherings with Friends and Family: Twice a week  . Attends Religious Services: Never  . Active Member of Clubs or Organizations: No  . Attends Archivist Meetings: Never  . Marital Status: Married    Tobacco Counseling Counseling given: No Comment: quit smoking 20+yrs ago   Clinical Intake:  Pre-visit  preparation completed: Yes  Pain : 0-10 Pain  Score: 8  Pain Type: Chronic pain Pain Location: Back Pain Descriptors / Indicators: Aching Pain Onset: More than a month ago Pain Frequency: Intermittent     BMI - recorded: 29.8 Nutritional Status: BMI 25 -29 Overweight Nutritional Risks: Nausea/ vomitting/ diarrhea (diarrhea yesterday from something he ate) Diabetes: Yes CBG done?: No Did pt. bring in CBG monitor from home?: No  How often do you need to have someone help you when you read instructions, pamphlets, or other written materials from your doctor or pharmacy?: 1 - Never  Nutrition Risk Assessment:  Has the patient had any N/V/D within the last 2 months?  Yes  Does the patient have any non-healing wounds?  No  Has the patient had any unintentional weight loss or weight gain?  No   Diabetes:  Is the patient diabetic?  Yes  If diabetic, was a CBG obtained today?  No  181 this am at home per patient. Did the patient bring in their glucometer from home?  No  How often do you monitor your CBG's? Fasting daily.   Financial Strains and Diabetes Management:  Are you having any financial strains with the device, your supplies or your medication? No .  Does the patient want to be seen by Chronic Care Management for management of their diabetes?  No  Would the patient like to be referred to a Nutritionist or for Diabetic Management?  No   Diabetic Exams:  Diabetic Eye Exam: Completed 02/15/2020. Overdue for diabetic eye exam. Pt has been advised about the importance in completing this exam. A referral has been placed today. Message sent to referral coordinator for scheduling purposes. Advised pt to expect a call from office referred to regarding appt.  Diabetic Foot Exam: Completed 11/04/2019. Pt has been advised about the importance in completing this exam. Pt is scheduled for diabetic foot exam on 09/27/20.    Interpreter Needed?: No  Information entered by :: Kable Haywood,  LPN   Activities of Daily Living In your present state of health, do you have any difficulty performing the following activities: 07/26/2020 09/07/2019  Hearing? Y N  Vision? N N  Difficulty concentrating or making decisions? Y N  Walking or climbing stairs? Y Y  Dressing or bathing? N N  Doing errands, shopping? N Y  Conservation officer, nature and eating ? N -  Using the Toilet? N -  In the past six months, have you accidently leaked urine? N -  Do you have problems with loss of bowel control? N -  Managing your Medications? N -  Managing your Finances? N -  Housekeeping or managing your Housekeeping? N -  Some recent data might be hidden    Patient Care Team: [name], DO as PCP - General (Family Medicine) Curly Rim, MD as Referring Physician (Internal Medicine) [name], DO (Family Medicine) Shea Evans [name], LCSW as Social Worker (Licensed Clinical Social Worker) Blanca Friend, Royce Macadamia, [facility] (Pharmacist)  Indicate any recent Medical Services you may have received from other than [facility] providers in the past year (date may be approximate).     Assessment:   This is a routine wellness examination for Naithen.  Hearing/Vision screen  Hearing Screening   125Hz  250Hz  500Hz  1000Hz  2000Hz  3000Hz  4000Hz  6000Hz  8000Hz   Right ear:           Left ear:           Comments: Declines hearing aids - he had them once and didn't like them, so  he sent back - c/o moderate hearing loss  Vision Screening Comments: Wears eyeglasses - Annual eye exams with Dr Katy Fitch  Dietary issues and exercise activities discussed: Current Exercise Habits: Home exercise routine, Type of exercise: walking, Time (Minutes): 10, Frequency (Times/Week): 7, Weekly Exercise (Minutes/Week): 70, Intensity: Mild, Exercise limited by: orthopedic condition(s)  Goals    .  Assistance with Transportation (pt-stated)      Transportation assistance needs in patient with CHF, COPD, and DM.  Current Barriers:   [name] Knowledge Deficits related to transportation assistance  Nurse Case Manager Clinical Goal(s):  [name] Over the next 7 days, patient/wife will work with CCM team regarding transportation assistance   Interventions:  . Outreach from PCP regarding ongoing transportation needs . Chart reviewed . Reached out to Care Guide for assistance with transportation needs. Prior referral reviewed and new notes documented for Care Guide's information.  . RN will discuss further with Care Guide after she has more information . RN will f/u with patient/family after more information has been provided on transportation services  Patient Self Care Activities:  . Self administers medications as prescribed . Calls provider office for new concerns or questions . Wife provides most care. Patient is wheelchair bound . Unable to independently walk   Please see past updates related to this goal by clicking on the "Past Updates" button in the selected goal      .  Caregiver Strain      Current Barriers:  . Lacks caregiver support.  . Film/video editor.  . Transportation barriers  Nurse Case Manager Clinical Goal(s):  [name] Over the next 30 days, wife will talk with LCSW regarding caregiver strain  Interventions:  . Discussed current stress . Recommended that she talk with Theadore Nan, LCSW. She will consider it.   Initial goal documentation     .  Chronic Disease Management Needs      Current Barriers:  . Chronic Disease Management support, education, and care coordination needs related to HTN, CAD, AAA, CHF, COPD, OSA, DM, hyperlipidemia, anxiety  Clinical Goal(s) related to HTN, CAD, AAA, CHF, COPD, OSA, DM, hyperlipidemia, anxiety:  Over the next 30 days, patient will:  . Work with the care management team to address educational, disease management, and care coordination needs  . Begin or continue self health monitoring activities as directed today Measure and record weight daily . Call provider  office for new or worsened signs and symptoms Weight outside established parameters . Call care management team with questions or concerns . Verbalize basic understanding of patient centered plan of care established today  Interventions related to HTN, CAD, AAA, CHF, COPD, OSA, DM, hyperlipidemia, anxiety:  . Evaluation of current treatment plans and patient's adherence to plan as established by provider . Assessed patient understanding of disease states . Assessed patient's education and care coordination needs . Provided disease specific education to patient  . Collaborated with appropriate clinical care team members regarding patient needs  Patient Self Care Activities related to HTN, CAD, AAA, CHF, COPD, OSA, DM, hyperlipidemia, anxiety:  . Patient is unable to independently self-manage chronic health conditions  Initial goal documentation     .  Client will talk with LCSW in next 30 days to discuss depression symptoms of client (pt-stated)      Current Barriers:  . Breathing challenges of client . Transportation challenges in client with Chronic Diagnoses of COPD, CAD, GERD, HLD, HTN, MDD, DM . Mobility challenges   Clinical Social Work Clinical Goal(s):  .  LCSW will talk with client in next 4 weeks to discuss client management of depression symptoms faced  Interventions: . Talked with Lubertha Sayres, spouse, about transport needs of client . Talked with Apolonio Schneiders about current client needs (client is currently a patient at [facility] . Talked with [name] about RNCM support and LCSW support . Talked with [name] about Medicaid application process for [employer] . Talked with [place] about financial needs of client  Patient Self Care Activities:  . {CCM SELF CARE ACTIVITIES  . Enjoys speaking via phone with his wife  . {CCM SELF CARE DEFICITS:       Needs help with daily ADLs       Needs help with medication administration  Initial Care Plan  documentation    .  High Risk for Falls      Current Barriers:  [name] Knowledge Deficits related to fall precautions . Decreased adherence to prescribed treatment for fall prevention . Wife is much smaller and isn't able to help him transfer well  Nurse Case Manager Clinical Goal(s):  [name] Over the next 30 days, patient will demonstrate improved adherence to prescribed treatment plan for decreasing falls as evidenced by patient reporting and review of EMR . Over the next 30 days, patient will verbalize using fall risk reduction strategies discussed . Over the next 90 days, patient will not experience additional falls . Over the next 30 days, patient will continue to work with PT to build up strength and stability  Interventions:  . Provided written and verbal education re: Potential causes of falls and Fall prevention strategies . Reviewed medications and discussed potential side effects of medications such as dizziness and frequent urination . Assessed for s/s of orthostatic hypotension . Assessed for falls since last encounter. . Assessed patients knowledge of fall risk prevention secondary to previously provided education.  Patient Self Care Activities:  . Utilize wheelchair (assistive device) appropriately with all ambulation . De-clutter walkways . Change positions slowly . Wear secure fitting shoes at all times with ambulation . Utilize home lighting for dim lit areas . Have self and pet awareness at all times  Plan: . CCM RN CM will follow up in 30   Initial goal documentation     .  Need for Hospital Bed      CARE PLAN ENTRY (see longtitudinal plan of care for additional care plan information)  Need for hospital bed in patient with DM, CHF, COPD, and bladder cancer  Current Barriers:  [name] Knowledge Deficits related to process for obtaining hospital bed . Decreased mobility  Nurse Case Manager Clinical Goal(s):  [name] Over the next 15 days, patient will work with DME supplier  to obtain hospital bed  Interventions:  . Consulted by Theadore Nan, LCSW who spoke with patient and his wife today o They explained that an order was sent from [facility] to [facility] but that [name] doesn't supply hospital beds o Requesting that it be sent somewhere else, like [facility] . Communicated to [name], LPN at [facility] that patient would like order sent to [facility]  Patient Self Care Activities:  . Has assistance from wife with ADLs  Initial goal documentation      .  Oxygen Therapy      Current Barriers:  . Need for increased oxygen therapy  Nurse Case Manager Clinical Goal(s):  [name] Over the next 24 hours, patient will increase oxygen to 3L/min . Over the next 7 days, patient will see  improvement in oxygen levels and shortness of breath  Interventions:  . Chart reviewed . Advised that order for increased oxygen was sent to [facility] . Evaluation of current treatment plan related to oxygen therapy and patient's adherence to plan as established by provider. . Advised patient to increase oxygen to 3L/min per Dr Marjean Donna instructions . Discussed plans with patient for ongoing care management follow up and provided patient with direct contact information for care management team  Patient Self Care Activities:  . Self administers medications as prescribed . Calls provider office for new concerns or questions . Unable to independently walk or perform most ADLs  Initial goal documentation       Depression Screen PHQ 2/9 Scores 07/26/2020 06/27/2020 05/30/2020 05/30/2020 02/04/2020 11/04/2019 07/26/2019  PHQ - 2 Score 2 0 6 6 3 1 1   PHQ- 9 Score 7 0 21 - 12 19 -    Fall Risk Fall Risk  07/26/2020 06/27/2020 05/30/2020 02/04/2020 11/04/2019  Falls in the past year? 1 1 0 1 1  Number falls in past yr: 1 1 0 1 1  Injury with Fall? 1 1 0 1 0  Comment - - - - -  Risk for fall due to : History of fall(s);Impaired balance/gait;Impaired  vision;Medication side effect;Orthopedic patient History of fall(s);Impaired balance/gait - History of fall(s);Impaired balance/gait History of fall(s);Impaired balance/gait  Follow up Falls prevention discussed;Education provided - - Falls prevention discussed Falls prevention discussed    FALL RISK PREVENTION PERTAINING TO THE HOME:  Any stairs in or around the home? No  If so, are there any without handrails? No  Home free of loose throw rugs in walkways, pet beds, electrical cords, etc? Yes  Adequate lighting in your home to reduce risk of falls? Yes   ASSISTIVE DEVICES UTILIZED TO PREVENT FALLS:  Life alert? No  Use of a cane, walker or w/c? Yes  Grab bars in the bathroom? Yes  Shower chair or bench in shower? Yes  Elevated toilet seat or a handicapped toilet? Yes   TIMED UP AND GO:  Was the test performed? No . Telephonic visit.  Cognitive Function: MMSE - Mini Mental State Exam 02/04/2020  Orientation to time 5  Orientation to Place 5  Registration 3  Attention/ Calculation 3  Recall 0  Language- name 2 objects 2  Language- repeat 1  Language- follow 3 step command 3  Language- read & follow direction 0  Write a sentence 0  Copy design 1  Total score 23     6CIT Screen 07/26/2020 07/26/2019  What Year? 4 points 4 points  What month? 0 points 3 points  What time? 0 points 3 points  Count back from 20 0 points 0 points  Months in reverse 4 points 4 points  Repeat phrase 2 points 8 points  Total Score 10 22    Immunizations Immunization History  Administered Date(s) Administered  . Fluad Quad(high Dose 65+) 02/04/2020  . Influenza, High Dose Seasonal PF 02/29/2016, 01/20/2017, 02/10/2018  . Influenza, Seasonal, Injecte, Preservative Fre 02/03/2014  . Influenza,inj,Quad PF,6+ Mos 01/01/2019  . Influenza-Unspecified 02/03/2014, 02/29/2016, 01/20/2017, 02/10/2018  . Moderna Sars-Covid-2 Vaccination 07/05/2019, 08/02/2019, 02/23/2020  . Pneumococcal  Conjugate-13 11/25/2014, 11/25/2014  . Pneumococcal Polysaccharide-23 05/30/2016, 05/30/2016, 01/01/2019  . Tdap 11/25/2014, 11/25/2014    TDAP status: Up to date  Flu Vaccine status: Up to date  Pneumococcal vaccine status: Up to date  Covid-19 vaccine status: Completed vaccines  Qualifies for Shingles Vaccine? Yes  Zostavax completed No   Shingrix Completed?: No.    Education has been provided regarding the importance of this vaccine. Patient has been advised to call insurance company to determine out of pocket expense if they have not yet received this vaccine. Advised may also receive vaccine at local pharmacy or Health Dept. Verbalized acceptance and understanding.  Screening Tests Health Maintenance  Topic Date Due  . FOOT EXAM  11/03/2020  . INFLUENZA VACCINE  11/13/2020  . HEMOGLOBIN A1C  11/27/2020  . URINE MICROALBUMIN  02/03/2021  . OPHTHALMOLOGY EXAM  02/14/2021  . TETANUS/TDAP  11/24/2024  . COVID-19 Vaccine  Completed  . PNA vac Low Risk Adult  Completed  . HPV VACCINES  Aged Out    Health Maintenance  There are no preventive care reminders to display for this patient.  Colorectal cancer screening: No longer required.   Lung Cancer Screening: (Low Dose CT Chest recommended if Age 69-80 years, 30 pack-year currently smoking OR have quit w/in 15years.) does not qualify.   Additional Screening:  Hepatitis C Screening: does not qualify  Vision Screening: Recommended annual ophthalmology exams for early detection of glaucoma and other disorders of the eye. Is the patient up to date with their annual eye exam?  Yes  Who is the provider or what is the name of the office in which the patient attends annual eye exams? [facility]  If pt is not established with a provider, would they like to be referred to a provider to establish care? No .   Dental Screening: Recommended annual dental exams for proper oral hygiene  Community Resource Referral / Chronic Care  Management: CRR required this visit?  No   CCM required this visit?  No      Plan:     I have personally reviewed and noted the following in the patient's chart:   . Medical and social history . Use of alcohol, tobacco or illicit drugs  . Current medications and supplements . Functional ability and status . Nutritional status . Physical activity . Advanced directives . List of other physicians . Hospitalizations, surgeries, and ER visits in previous 12 months . Vitals . Screenings to include cognitive, depression, and falls . Referrals and appointments  In addition, I have reviewed and discussed with patient certain preventive protocols, quality metrics, and best practice recommendations. A written personalized care plan for preventive services as well as general preventive health recommendations were provided to patient.     Sandrea Hammond, LPN   [id number]   Nurse Notes: His wife stopped giving him Trulicity because she didn't feel it was helping - she put him back on Glipizide BID and Januvia daily - today sugar was lower than it has been in a while - 181 fasting.

## 2020-08-01 DIAGNOSIS — J449 Chronic obstructive pulmonary disease, unspecified: Secondary | ICD-10-CM | POA: Diagnosis not present

## 2020-08-11 ENCOUNTER — Other Ambulatory Visit: Payer: Self-pay | Admitting: Family Medicine

## 2020-08-11 DIAGNOSIS — I5033 Acute on chronic diastolic (congestive) heart failure: Secondary | ICD-10-CM

## 2020-08-29 ENCOUNTER — Ambulatory Visit: Payer: Medicare Other | Admitting: Family Medicine

## 2020-08-31 DIAGNOSIS — J449 Chronic obstructive pulmonary disease, unspecified: Secondary | ICD-10-CM | POA: Diagnosis not present

## 2020-09-12 ENCOUNTER — Other Ambulatory Visit: Payer: Self-pay | Admitting: Family Medicine

## 2020-09-12 ENCOUNTER — Other Ambulatory Visit: Payer: Self-pay | Admitting: Family

## 2020-09-12 DIAGNOSIS — N1831 Chronic kidney disease, stage 3a: Secondary | ICD-10-CM

## 2020-09-12 DIAGNOSIS — D500 Iron deficiency anemia secondary to blood loss (chronic): Secondary | ICD-10-CM

## 2020-09-12 DIAGNOSIS — E1122 Type 2 diabetes mellitus with diabetic chronic kidney disease: Secondary | ICD-10-CM

## 2020-09-12 DIAGNOSIS — K5909 Other constipation: Secondary | ICD-10-CM

## 2020-09-12 DIAGNOSIS — I5033 Acute on chronic diastolic (congestive) heart failure: Secondary | ICD-10-CM

## 2020-09-26 ENCOUNTER — Telehealth: Payer: Self-pay | Admitting: Family Medicine

## 2020-09-26 MED ORDER — GLIPIZIDE 5 MG PO TABS: ORAL_TABLET | ORAL | 1 refills | Status: DC

## 2020-09-26 NOTE — Telephone Encounter (Signed)
Spouse wants to know why there are no refills at the drug store pharmacy for JANUVIA 100 MG tablet and glipiZIDE (GLUCOTROL) 5 MG tablet.  I explained to pt that per last ov pt was to stop those medications and start TRESIBA FLEXTOUCH 100 UNIT/ML FlexTouch Pen.  Please call back to clarify

## 2020-09-26 NOTE — Telephone Encounter (Signed)
Pt called and clarified - appt made as well

## 2020-09-27 ENCOUNTER — Ambulatory Visit: Payer: Medicare Other | Admitting: Family Medicine

## 2020-10-01 DIAGNOSIS — J449 Chronic obstructive pulmonary disease, unspecified: Secondary | ICD-10-CM | POA: Diagnosis not present

## 2020-10-07 ENCOUNTER — Other Ambulatory Visit: Payer: Self-pay | Admitting: Family Medicine

## 2020-10-07 DIAGNOSIS — I5033 Acute on chronic diastolic (congestive) heart failure: Secondary | ICD-10-CM

## 2020-10-07 DIAGNOSIS — F5101 Primary insomnia: Secondary | ICD-10-CM

## 2020-10-13 ENCOUNTER — Other Ambulatory Visit: Payer: Self-pay

## 2020-10-13 ENCOUNTER — Ambulatory Visit (INDEPENDENT_AMBULATORY_CARE_PROVIDER_SITE_OTHER): Payer: Medicare Other | Admitting: Family Medicine

## 2020-10-13 ENCOUNTER — Encounter: Payer: Self-pay | Admitting: Family Medicine

## 2020-10-13 VITALS — BP 122/69 | HR 75 | Temp 98.0°F | Ht 68.0 in | Wt 192.4 lb

## 2020-10-13 DIAGNOSIS — E785 Hyperlipidemia, unspecified: Secondary | ICD-10-CM

## 2020-10-13 DIAGNOSIS — I152 Hypertension secondary to endocrine disorders: Secondary | ICD-10-CM | POA: Diagnosis not present

## 2020-10-13 DIAGNOSIS — E1165 Type 2 diabetes mellitus with hyperglycemia: Secondary | ICD-10-CM

## 2020-10-13 DIAGNOSIS — N1832 Chronic kidney disease, stage 3b: Secondary | ICD-10-CM

## 2020-10-13 DIAGNOSIS — E1169 Type 2 diabetes mellitus with other specified complication: Secondary | ICD-10-CM | POA: Diagnosis not present

## 2020-10-13 DIAGNOSIS — F331 Major depressive disorder, recurrent, moderate: Secondary | ICD-10-CM

## 2020-10-13 DIAGNOSIS — F411 Generalized anxiety disorder: Secondary | ICD-10-CM

## 2020-10-13 DIAGNOSIS — E1159 Type 2 diabetes mellitus with other circulatory complications: Secondary | ICD-10-CM | POA: Diagnosis not present

## 2020-10-13 DIAGNOSIS — E039 Hypothyroidism, unspecified: Secondary | ICD-10-CM | POA: Diagnosis not present

## 2020-10-13 LAB — BAYER DCA HB A1C WAIVED: HB A1C (BAYER DCA - WAIVED): 7.2 % — ABNORMAL HIGH (ref <7.0)

## 2020-10-13 MED ORDER — MIRTAZAPINE 7.5 MG PO TABS: 7.5000 mg | ORAL_TABLET | Freq: Every day | ORAL | 0 refills | Status: DC

## 2020-10-13 MED ORDER — RYBELSUS 3 MG PO TABS: 3.0000 mg | ORAL_TABLET | Freq: Every day | ORAL | 0 refills | Status: AC

## 2020-10-13 NOTE — Patient Instructions (Signed)
STOP Januvia. CAN NOT TAKE with his kidney function.  You will poison him.  Adding Rybelsus. NO SODAS with this.  Otherwise you will be sick.  Most common side effect is nausea.  Let me know if it is severe.  See Almyra Free in 2 weeks for recheck sugar and advancement to next dose if needed.

## 2020-10-13 NOTE — Progress Notes (Signed)
Subjective: CC: DM PCP: [name], DO SEG:BTDV D Shatswell is a 80 y.o. male presenting to clinic today for:  1. Type 2 Diabetes with hypertension, hyperlipidemia w/ CKD3b:  Wife notes that he discontinued insulin and resumed use of glipizide secondary to persistent hyperglycemia with insulin.  He is compliant with all other medications.  Want to know if they can start back on Januvia  Last eye exam: Up-to-date Last foot exam: Up-to-date Last A1c:  Lab Results  Component Value Date   HGBA1C 8.2 (H) 05/30/2020   Nephropathy screen indicated?:  Up-to-date Last flu, zoster and/or pneumovax:  Immunization History  Administered Date(s) Administered   Fluad Quad(high Dose 65+) 02/04/2020   Influenza, High Dose Seasonal PF 02/29/2016, 01/20/2017, 02/10/2018   Influenza, Seasonal, Injecte, Preservative Fre 02/03/2014   Influenza,inj,Quad PF,6+ Mos 01/01/2019   Influenza-Unspecified 02/03/2014, 02/29/2016, 01/20/2017, 02/10/2018   Moderna Sars-Covid-2 Vaccination 07/05/2019, 08/02/2019, 02/23/2020   Pneumococcal Conjugate-13 11/25/2014, 11/25/2014   Pneumococcal Polysaccharide-23 05/30/2016, 05/30/2016, 01/01/2019   Tdap 11/25/2014, 11/25/2014    ROS: No reports of chest pain, shortness of breath  2.  Depression/anxiety His wife notes that he suffers from both depression and anxiety.  He is very jovial when he comes out into the clinic or sees other people but at home he often has poor motivation, low drive and tends to isolate.  This is despite treatment with Lexapro.   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   Abdominal aortic aneurysm without rupture (HCC)    Anemia    Anxiety and depression    Aortic stenosis    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HCC)     CHF (congestive heart failure) (HCC)    Cholecystitis    Chronic ischemic heart disease    Complication of anesthesia    hard to be put to sleep   COPD (chronic obstructive pulmonary disease) (HCC)    Coronary artery disease    Diabetes mellitus without complication (HCC)    Diabetes mellitus without complication ([place])    takes Januvia and Metformin daily   GERD (gastroesophageal reflux disease)    GERD (gastroesophageal reflux disease)    takes omeprazole daily   Headache(784.0)    sinus   History of bronchitis    last time >10yrs ago   Hyperlipidemia    takes Lipitor daily   Hypertension    Hypertension    takes Metoprolol daily   Joint pain    Myocardial infarction ([place])    x 3;last one about 3-79yrs ago   Neoplasm of uncertain behavior of bladder    Obesity    OSA (obstructive sleep apnea)    Pacemaker    Pancytopenia (HCC)    Paroxysmal atrial fibrillation (HCC)    Pneumonia    last itme about 69yrs ago   S/P TAVR (transcatheter aortic valve replacement)    Severe aortic stenosis    Sinus bradycardia     Current Outpatient Medications:    acetaZOLAMIDE (DIAMOX) 250 MG tablet, Take by mouth., Disp: , Rfl:    albuterol (PROVENTIL) (2.5 MG/3ML) 0.083% nebulizer solution, Take 2.5 mg by nebulization every 6 (six) hours as needed for wheezing or shortness of breath., Disp: , Rfl:    albuterol (VENTOLIN HFA)  108 (90 Base) MCG/ACT inhaler, Inhale 2 puffs into the lungs every 6 (six) hours as needed for wheezing or shortness of breath., Disp: 18 g, Rfl: 3   aspirin EC 81 MG tablet, Take 81 mg by mouth daily., Disp: , Rfl:    calcium carbonate (OSCAL) 1500 (600 Ca) MG TABS tablet, Take by mouth 2 (two) times daily with a meal., Disp: , Rfl:    clopidogrel (PLAVIX) 75 MG tablet, TAKE ONE (1) TABLET EACH DAY, Disp: 90 tablet, Rfl: 0   escitalopram (LEXAPRO) 10 MG tablet, TAKE ONE (1) TABLET EACH DAY, Disp: 90 tablet, Rfl: 0   FARXIGA 10 MG TABS tablet, TAKE ONE TABLET EACH  MORNING BEFORE BREAKFAST, Disp: 30 tablet, Rfl: 3   ferrous sulfate 325 (65 FE) MG EC tablet, Take 325 mg by mouth every Monday, Wednesday, and Friday. , Disp: , Rfl:    glipiZIDE (GLUCOTROL) 5 MG tablet, TAKE 1 TABLET TWICE A DAY BEFORE A MEAL, Disp: 60 tablet, Rfl: 1   Insulin Pen Needle (NOVOFINE PLUS PEN NEEDLE) 32G X 4 MM MISC, UAD to inject Tresiba nightly. E11.65, Disp: 100 each, Rfl: 3   JANUVIA 100 MG tablet, Take by mouth., Disp: , Rfl:    levothyroxine (SYNTHROID) 100 MCG tablet, TAKE ONE (1) TABLET EACH DAY, Disp: 90 tablet, Rfl: 3   lubiprostone (AMITIZA) 24 MCG capsule, TAKE 1 CAPSULE TWICE A DAY WITH MEALS., Disp: 60 capsule, Rfl: 1   Multiple Vitamin (MULTIVITAMIN WITH MINERALS) TABS, Take 1 tablet by mouth daily., Disp: , Rfl:    nitroGLYCERIN (NITROSTAT) 0.4 MG SL tablet, Place 0.4 mg under the tongue every 5 (five) minutes as needed. For chest pain, Disp: , Rfl:    ondansetron (ZOFRAN-ODT) 4 MG disintegrating tablet, TAKE 1 TABLET EVERY 8 HOURS AS NEEDED FOR NAUSEA AND VOMITING, Disp: 20 tablet, Rfl: 0   pantoprazole (PROTONIX) 40 MG tablet, TAKE ONE (1) TABLET EACH DAY, Disp: 90 tablet, Rfl: 0   rosuvastatin (CRESTOR) 10 MG tablet, Take 10 mg by mouth daily., Disp: , Rfl:    sodium chloride (OCEAN) 0.65 % nasal spray, Place into the nose. (Patient not taking: Reported on 07/26/2020), Disp: , Rfl:    spironolactone (ALDACTONE) 25 MG tablet, Take 25 mg by mouth daily. , Disp: , Rfl:    SYMBICORT 80-4.5 MCG/ACT inhaler, Inhale 2 puffs into the lungs 2 (two) times daily as needed., Disp: , Rfl:    torsemide (DEMADEX) 20 MG tablet, TAKE TWO TABLETS BY MOUTH TWICE DAILY, Disp: 120 tablet, Rfl: 0   traZODone (DESYREL) 50 MG tablet, TAKE 1/2 TO 1 TABLET AT BEDTIME AS NEEDED FOR SLEEP, Disp: 90 tablet, Rfl: 1   TRESIBA FLEXTOUCH 100 UNIT/ML FlexTouch Pen, INJECT 5-10 UNITS SQ DAILY (Patient not taking: Reported on 07/26/2020), Disp: 15 mL, Rfl: 6 Social History   Socioeconomic History    Marital status: Married    Spouse name: Not on file   Number of children: Not on file   Years of education: Not on file   Highest education level: Not on file  Occupational History   Occupation: retired  Tobacco Use   Smoking status: Former    Pack years: 0.00   Smokeless tobacco: Never   Tobacco comments:    quit smoking 20+yrs ago  Vaping Use   Vaping Use: Never used  Substance and Sexual Activity   Alcohol use: No    Comment: last drank about 2-3 years ago   Drug use: No   Sexual  activity: Yes  Other Topics Concern   Not on file  Social History Narrative   Lives with his wife - their grandson lives with them at times       Social Determinants of Health   Financial Resource Strain: Low Risk    Difficulty of Paying Living Expenses: Not very hard  Food Insecurity: No Food Insecurity   Worried About Charity fundraiser in the Last Year: Never true   Arboriculturist in the Last Year: Never true  Transportation Needs: No Transportation Needs   Lack of Transportation (Medical): No   Lack of Transportation (Non-Medical): No  Physical Activity: Insufficiently Active   Days of Exercise per Week: 7 days   Minutes of Exercise per Session: 20 min  Stress: Stress Concern Present   Feeling of Stress : To some extent  Social Connections: Moderately Isolated   Frequency of Communication with Friends and Family: Twice a week   Frequency of Social Gatherings with Friends and Family: Twice a week   Attends Religious Services: Never   Marine scientist or Organizations: No   Attends Music therapist: Never   Marital Status: Married  Human resources officer Violence: Not At Risk   Fear of Current or Ex-Partner: No   Emotionally Abused: No   Physically Abused: No   Sexually Abused: No   Family History  Problem Relation Age of Onset   Cancer Mother        Unknown type   Heart disease Father    Diabetes Father    Heart disease Sister    Seizures Brother    Diabetes  Daughter    Diabetes Daughter    Heart attack Son    Colon cancer Mother        in her 56s.    Stomach cancer Neg Hx    Esophageal cancer Neg Hx     Objective: Office vital signs reviewed. BP 122/69   Pulse 75   Temp 98 F (36.7 C)   Ht $R'5\' 8"'nZ$  (1.727 m)   Wt 192 lb 6.4 oz (87.3 kg)   SpO2 94%   BMI 29.25 kg/m   Physical Examination:  General: Awake, alert, nontoxic, No acute distress HEENT: Normal.  Sclera white Cardio: regular rate and rhythm, S1S2 heard, soft murmur noted Pulm: clear to auscultation bilaterally, no wheezes, rhonchi or rales; normal work of breathing on room air GI: Soft, nontender, protuberant Psych: Patient is pleasant, jovial, has good eye contact  Depression screen [facility] 2/9 10/13/2020 07/26/2020 06/27/2020  Decreased Interest 3 1 0  Down, Depressed, Hopeless 3 1 0  PHQ - 2 Score 6 2 0  Altered sleeping 3 0 0  Tired, decreased energy 3 2 0  Change in appetite 3 1 0  Feeling bad or failure about yourself  2 0 0  Trouble concentrating 2 2 0  Moving slowly or fidgety/restless 3 0 0  Suicidal thoughts 2 0 0  PHQ-9 Score 24 7 0  Difficult doing work/chores Very difficult Somewhat difficult -  Some recent data might be hidden   GAD 7 : Generalized Anxiety Score 10/13/2020 02/04/2020 11/04/2019 05/13/2019  Nervous, Anxious, on Edge $Remov'3 3 3 1  'naFOxF$ Control/stop worrying 2 3 0 1  Worry too much - different things 3 2 0 1  Trouble relaxing $RemoveBeforeDE'3 2 3 1  'oiueSKQhZYnnanL$ Restless $RemoveBeforeD'3 2 3 1  'XjbcOdJDBbecir$ Easily annoyed or irritable $RemoveBefo'3 3 3 'uODrOzEoinp$ 0  Afraid - awful might happen 3 2  3 0  Total GAD 7 Score $Remov'20 17 15 5  'GmNseY$ Anxiety Difficulty Very difficult - Extremely difficult Somewhat difficult      Assessment/ Plan: 80 y.o. male   Uncontrolled type 2 diabetes mellitus with hyperglycemia ([place]) - Plan: Bayer DCA Hb A1c Waived, Semaglutide (RYBELSUS) 3 MG TABS  Stage 3b chronic kidney disease ([place]) - Plan: CMP14+EGFR, CBC  Hyperlipidemia associated with type 2 diabetes mellitus ([place]) - Plan: CMP14+EGFR, Lipid  Panel  Hypertension associated with diabetes ([place]) - Plan: CMP14+EGFR  Acquired hypothyroidism - Plan: TSH, T4, Free  Moderate episode of recurrent major depressive disorder ([place]) - Plan: mirtazapine (REMERON) 7.5 MG tablet  Generalized anxiety disorder - Plan: mirtazapine (REMERON) 7.5 MG tablet  Trial of Rybelsus.  Class previously not trialed because he was recovering from postsurgical complications of cholecystectomy and was worried about exacerbating any abdominal pain in this patient.  Discontinue insulin.  A1c was reasonable at 7.3 today.  Discussed that likely needed to back off on glipizide as Rybelsus is titrated.  We discussed the possible side effects of Rybelsus including nausea and abdominal pain.  Avoid carbonated beverages  Check renal function, CBC  May need to consider switching Crestor to atorvastatin given renal dysfunction.  Check thyroid labs  Switch Lexapro to mirtazapine.  Advised to hold off on a few days before switch due to initiation of Rybelsus as well as above  No orders of the defined types were placed in this encounter.  No orders of the defined types were placed in this encounter.    [name], DO [place] [phone]

## 2020-10-14 LAB — CMP14+EGFR
ALT: 16 IU/L (ref 0–44)
AST: 16 IU/L (ref 0–40)
Albumin/Globulin Ratio: 1.6 (ref 1.2–2.2)
Albumin: 4.5 g/dL (ref 3.7–4.7)
Alkaline Phosphatase: 68 IU/L (ref 44–121)
BUN/Creatinine Ratio: 21 (ref 10–24)
BUN: 34 mg/dL — ABNORMAL HIGH (ref 8–27)
Bilirubin Total: 0.5 mg/dL (ref 0.0–1.2)
CO2: 26 mmol/L (ref 20–29)
Calcium: 9.7 mg/dL (ref 8.6–10.2)
Chloride: 91 mmol/L — ABNORMAL LOW (ref 96–106)
Creatinine, Ser: 1.59 mg/dL — ABNORMAL HIGH (ref 0.76–1.27)
Globulin, Total: 2.8 g/dL (ref 1.5–4.5)
Glucose: 293 mg/dL — ABNORMAL HIGH (ref 65–99)
Potassium: 4 mmol/L (ref 3.5–5.2)
Sodium: 135 mmol/L (ref 134–144)
Total Protein: 7.3 g/dL (ref 6.0–8.5)
eGFR: 44 mL/min/{1.73_m2} — ABNORMAL LOW (ref >59)

## 2020-10-14 LAB — CBC
Hematocrit: 46.8 % (ref 37.5–51.0)
Hemoglobin: 15.4 g/dL (ref 13.0–17.7)
MCH: 32.2 pg (ref 26.6–33.0)
MCHC: 32.9 g/dL (ref 31.5–35.7)
MCV: 98 fL — ABNORMAL HIGH (ref 79–97)
Platelets: 146 10*3/uL — ABNORMAL LOW (ref 150–450)
RBC: 4.78 x10E6/uL (ref 4.14–5.80)
RDW: 14.6 % (ref 11.6–15.4)
WBC: 8.6 10*3/uL (ref 3.4–10.8)

## 2020-10-14 LAB — LIPID PANEL
Chol/HDL Ratio: 4.2 ratio (ref 0.0–5.0)
Cholesterol, Total: 146 mg/dL (ref 100–199)
HDL: 35 mg/dL — ABNORMAL LOW (ref >39)
LDL Chol Calc (NIH): 58 mg/dL (ref 0–99)
Triglycerides: 340 mg/dL — ABNORMAL HIGH (ref 0–149)
VLDL Cholesterol Cal: 53 mg/dL — ABNORMAL HIGH (ref 5–40)

## 2020-10-14 LAB — T4, FREE: Free T4: 1.52 ng/dL (ref 0.82–1.77)

## 2020-10-14 LAB — TSH: TSH: 1.63 u[IU]/mL (ref 0.450–4.500)

## 2020-10-17 ENCOUNTER — Telehealth: Payer: Self-pay

## 2020-10-17 NOTE — Chronic Care Management (AMB) (Signed)
  Chronic Care Management   Note  10/17/2020 Name: DAMEAN POFFENBERGER MRN: [id number] DOB: 04-24-1940  Chrystie Nose Fricke is a 80 y.o. year old male who is a primary care patient of [name], DO. AVYN COATE is currently enrolled in care management services. An additional referral for Pharm D  was placed.   Follow up plan: Telephone appointment with care management team member scheduled for:10/24/2020  Noreene Larsson, [place], [place], [place] [address] Direct Dial: [phone] [email or web address]: [email or web address]

## 2020-10-24 ENCOUNTER — Ambulatory Visit (INDEPENDENT_AMBULATORY_CARE_PROVIDER_SITE_OTHER): Payer: Medicare Other | Admitting: Pharmacist

## 2020-10-24 DIAGNOSIS — E1165 Type 2 diabetes mellitus with hyperglycemia: Secondary | ICD-10-CM | POA: Diagnosis not present

## 2020-10-24 NOTE — Progress Notes (Signed)
Chronic Care Management Pharmacy Note  10/24/2020 Name:  Matthew Campos MRN:  [id number] DOB:  Dec 20, 1940  Summary: diabetes management  Recommendations/Changes made from today's visit: Diabetes: Uncontrolled; current treatment: rybelsus, farxiga, glipizide;  A1C 7.2% (IMPROVING) GFR 44 Denies personal and family history of Medullary thyroid cancer (MTC) Work to d/c glipizide Patient was recently on steroids, Bgs higher Current glucose readings: fasting glucose: 191, 200, 201, 218, 217, 286, 320, 228, 198, post prandial glucose: n/a Denies hypoglycemic/hyperglycemic symptoms Educated on MEDICATIONS (PURPOSE AND SIDE EFFECTS); INCREASED DOSE OF RYBELSUS Recommended INCREASE TO RYBELSUS 7MG AFTER 3MG SAMPLE PACK  Follow Up Plan: Telephone follow up appointment with care management team member scheduled for: 1 month; assisted patient with f/u appt for sinus infection for tomorrow 7/13   Subjective: Matthew Campos is an 80 y.o. year old male who is a primary patient of [name], DO.  The CCM team was consulted for assistance with disease management and care coordination needs.    Engaged with patient face to face for initial visit in response to provider referral for pharmacy case management and/or care coordination services.   Consent to Services:  The patient was given information about Chronic Care Management services, agreed to services, and gave verbal consent prior to initiation of services.  Please see initial visit note for detailed documentation.   Patient Care Team: [name], DO as PCP - General (Family Medicine) Curly Rim, MD as Referring Physician (Internal Medicine) [name], DO (Family Medicine) Katha Cabal, LCSW as Social Worker (Licensed Clinical Social Worker) Lottie Dawson D, [facility] (Pharmacist)   Objective:  Lab Results  Component Value Date   CREATININE 1.59 (H) 10/13/2020   CREATININE 1.67 (H) 05/30/2020    CREATININE 1.71 (H) 02/04/2020    Lab Results  Component Value Date   HGBA1C 7.2 (H) 10/13/2020   Last diabetic Eye exam:  Lab Results  Component Value Date/Time   HMDIABEYEEXA No Retinopathy 02/15/2020 12:00 AM    Last diabetic Foot exam: No results found for: HMDIABFOOTEX      Component Value Date/Time   CHOL 146 10/13/2020 1055   TRIG 340 (H) 10/13/2020 1055   HDL 35 (L) 10/13/2020 1055   CHOLHDL 4.2 10/13/2020 1055   CHOLHDL 3.3 04/13/2019 1722   VLDL 18 04/13/2019 1722   LDLCALC 58 10/13/2020 1055    Hepatic Function Latest Ref Rng & Units 10/13/2020 05/30/2020 02/04/2020  Total Protein 6.0 - 8.5 g/dL 7.3 - 7.6  Albumin 3.7 - 4.7 g/dL 4.5 4.7 4.4  AST 0 - 40 IU/L 16 - 15  ALT 0 - 44 IU/L 16 - 12  Alk Phosphatase 44 - 121 IU/L 68 - 103  Total Bilirubin 0.0 - 1.2 mg/dL 0.5 - 0.5    Lab Results  Component Value Date/Time   TSH 1.630 10/13/2020 10:55 AM   TSH 1.860 05/30/2020 10:05 AM   FREET4 1.52 10/13/2020 10:55 AM   FREET4 0.53 (L) 04/14/2019 01:11 AM    CBC Latest Ref Rng & Units 10/13/2020 05/30/2020 02/04/2020  WBC 3.4 - 10.8 x10E3/uL 8.6 9.3 8.5  Hemoglobin 13.0 - 17.7 g/dL 15.4 14.8 13.3  Hematocrit 37.5 - 51.0 % 46.8 46.9 41.0  Platelets 150 - 450 x10E3/uL 146(L) 155 172    No results found for: VD25OH  Clinical ASCVD: Yes  The ASCVD Risk score Mikey Bussing DC Jr., et al., 2013) failed to calculate for the following reasons:   The 2013 ASCVD risk score  is only valid for ages 35 to 42   The patient has a prior MI or stroke diagnosis    Other: (CHADS2VASc if Afib, PHQ9 if depression, MMRC or CAT for COPD, ACT, DEXA)  Social History   Tobacco Use  Smoking Status Former  Smokeless Tobacco Never  Tobacco Comments   quit smoking 20+yrs ago   BP Readings from Last 3 Encounters:  10/13/20 122/69  06/27/20 112/71  05/30/20 122/72   Pulse Readings from Last 3 Encounters:  10/13/20 75  06/27/20 70  05/30/20 76   Wt Readings from Last 3 Encounters:   10/13/20 192 lb 6.4 oz (87.3 kg)  07/26/20 196 lb (88.9 kg)  06/27/20 196 lb (88.9 kg)    Assessment: Review of patient past medical history, allergies, medications, health status, including review of consultants reports, laboratory and other test data, was performed as part of comprehensive evaluation and provision of chronic care management services.   SDOH:  (Social Determinants of Health) assessments and interventions performed:    CCM Care Plan  Allergies  Allergen Reactions   Codeine Nausea And Vomiting   Codeine    Latex Hives and Itching   Lisinopril    Lisinopril Cough    Medications Reviewed Today     Reviewed by Ivy Lynn, NP (Nurse Practitioner) on 10/25/20 at [place] List Status: <None>   Medication Order Taking? Sig Documenting Provider Last Dose Status Informant  acetaZOLAMIDE (DIAMOX) 250 MG tablet [id number] No Take by mouth. [provider] Taking Expired 06/13/19 2359   albuterol (PROVENTIL) (2.5 MG/3ML) 0.083% nebulizer solution [id number] No Take 2.5 mg by nebulization every 6 (six) hours as needed for wheezing or shortness of breath. [provider] Taking Active Spouse/Significant Other  albuterol (VENTOLIN HFA) 108 (90 Base) MCG/ACT inhaler [id number] No Inhale 2 puffs into the lungs every 6 (six) hours as needed for wheezing or shortness of breath. Loman [place], FNP Taking Active Spouse/Significant Other  aspirin EC 81 MG tablet [id number] No Take 81 mg by mouth daily. [provider] Taking Active Spouse/Significant Other           Med Note Bradly Chris, Gloris [place]   Tue Sep 07, 2019  9:28 AM)    benzonatate (TESSALON PERLES) 100 MG capsule [id number] Yes Take 1 capsule (100 mg total) by mouth 3 (three) times daily as needed for cough. Ivy Lynn, NP  Active   calcium carbonate (OSCAL) 1500 (600 Ca) MG TABS tablet [id number] No Take by mouth 2 (two) times daily with a meal. [provider] Taking Active  Spouse/Significant Other  clopidogrel (PLAVIX) 75 MG tablet [id number] No TAKE ONE (1) TABLET EACH DAY Gottschalk, [place] M, DO Taking Active   dextromethorphan-guaiFENesin (MUCINEX DM) 30-600 MG 12hr tablet [id number] Yes Take 1 tablet by mouth 2 (two) times daily. Ivy Lynn, NP  Active   FARXIGA 10 MG TABS tablet [id number] No TAKE ONE TABLET EACH MORNING BEFORE BREAKFAST Ronnie Doss M, [place] Taking Active   ferrous sulfate 325 (65 FE) MG EC tablet [id number] No Take 325 mg by mouth every Monday, Wednesday, and Friday.  [provider] Taking Active Spouse/Significant Other  glipiZIDE (GLUCOTROL) 5 MG tablet [id number] No TAKE 1 TABLET TWICE A DAY BEFORE A MEAL Gottschalk, Ashly M, DO Taking Active   levothyroxine (SYNTHROID) 100 MCG tablet [id number] No TAKE ONE (1) TABLET EACH DAY Gottschalk, [place], DO Taking Active   lubiprostone (AMITIZA) 24 MCG capsule [id number] No TAKE 1 CAPSULE  TWICE A DAY WITH MEALS. Ronnie Doss M, DO Taking Active   mirtazapine (REMERON) 7.5 MG tablet [id number]  Take 1 tablet (7.5 mg total) by mouth at bedtime. STOP Jerelyn Scott M, DO  Active   Multiple Vitamin (MULTIVITAMIN WITH MINERALS) TABS [id number] No Take 1 tablet by mouth daily. [provider] Taking Active Spouse/Significant Other  nitroGLYCERIN (NITROSTAT) 0.4 MG SL tablet [id number] No Place 0.4 mg under the tongue every 5 (five) minutes as needed. For chest pain [provider] Taking Active Spouse/Significant Other  ondansetron (ZOFRAN-ODT) 4 MG disintegrating tablet [id number] No TAKE 1 TABLET EVERY 8 HOURS AS NEEDED FOR NAUSEA AND VOMITING Ronnie Doss M, DO Taking Active   pantoprazole (PROTONIX) 40 MG tablet [id number] No TAKE ONE (1) TABLET EACH DAY Gottschalk, Ashly M, DO Taking Active   predniSONE (STERAPRED UNI-PAK 21 TAB) 10 MG (21) TBPK tablet [id number] Yes 6 tablet day 1, 5 tablet day 2, 4 tablet day 3, 3 tablet day 4, 2 tablet day 5, 1 tablet day 6.  Ivy Lynn, NP  Active   rosuvastatin (CRESTOR) 10 MG tablet [id number] No Take 10 mg by mouth daily. [provider] Taking Active Spouse/Significant Other  Semaglutide (RYBELSUS) 3 MG TABS [id number]  Take 3 mg by mouth daily. Ronnie Doss M, DO  Active   sodium chloride (OCEAN) 0.65 % nasal spray [id number] No Place into the nose.  Patient not taking: No sig reported   [provider] Not Taking Active   spironolactone (ALDACTONE) 25 MG tablet [id number] No Take 25 mg by mouth daily.  [provider] 09/06/2019 Unknown time Expired 09/07/19 2359 Spouse/Significant Other  SYMBICORT 80-4.5 MCG/ACT inhaler [id number] No Inhale 2 puffs into the lungs 2 (two) times daily as needed. [provider] Taking Active Spouse/Significant Other  torsemide (DEMADEX) 20 MG tablet [id number] No TAKE TWO TABLETS BY MOUTH TWICE DAILY [name], DO Taking Active   traZODone (DESYREL) 50 MG tablet [id number] No TAKE 1/2 TO 1 TABLET AT BEDTIME AS NEEDED FOR SLEEP [name], DO Taking Active             Patient Active Problem List   Diagnosis Date Noted   Viral upper respiratory tract infection 10/25/2020   Hyperlipidemia associated with type 2 diabetes mellitus ([place]) 02/04/2020   CAP (community acquired pneumonia) 09/07/2019   Hypomagnesemia 09/07/2019   CKD (chronic kidney disease), stage III ([place]) 09/07/2019   Volume overload 09/07/2019   Community acquired pneumonia 09/07/2019   GI bleeding 04/14/2019   Acute on chronic diastolic heart failure (HCC)    AKI (acute kidney injury) ([place])    Anemia due to blood loss    Bradycardia    Renal insufficiency    UGI bleed 04/13/2019   Chest pain 04/13/2019   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/13/2019   Cholecystostomy care ([place]) 04/13/2019   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   COPD (chronic obstructive pulmonary disease) (HCC)    Coronary artery disease     Chronic ischemic heart disease    Paroxysmal atrial fibrillation (HCC)    Cholecystitis    OSA (obstructive sleep apnea)    Coagulopathy (HCC)    Chronic anticoagulation    Dysphagia    Elevated alkaline phosphatase measurement    Elevated bilirubin    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   Sinus bradycardia 02/18/2019   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   Normocytic anemia 12/23/2018  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[place]) 12/23/2018   OSA (obstructive sleep apnea) 12/23/2018   Severe aortic stenosis 11/26/2018   Anxiety 10/28/2018   CHF (congestive heart failure) ([place]) 10/28/2018   At risk for falls 10/28/2018   Oxygen dependent 10/28/2018   Major depressive disorder, single episode, severe without psychotic features ([place]) 07/21/2017   Thrombocytopenia ([place]) 07/21/2017   COPD (chronic obstructive pulmonary disease) ([place]) 12/27/2015   CAD (coronary artery disease) 12/27/2015   Gastroesophageal reflux disease 12/27/2015   Hypertension associated with diabetes ([place]) 12/27/2015   Hyperlipidemia 09/09/2013   Type 2 diabetes mellitus ([place]) 09/09/2013    Immunization History  Administered Date(s) Administered   Fluad Quad(high Dose 65+) 02/04/2020   Influenza, High Dose Seasonal PF 02/29/2016, 01/20/2017, 02/10/2018   Influenza, Seasonal, Injecte, Preservative Fre 02/03/2014   Influenza,inj,Quad PF,6+ Mos 01/01/2019   Influenza-Unspecified 02/03/2014, 02/29/2016, 01/20/2017, 02/10/2018   Moderna Sars-Covid-2 Vaccination 07/05/2019, 08/02/2019, 02/23/2020   Pneumococcal Conjugate-13 11/25/2014, 11/25/2014   Pneumococcal Polysaccharide-23 05/30/2016, 05/30/2016, 01/01/2019   Tdap 11/25/2014, 11/25/2014    Conditions to be addressed/monitored: DMII  Care Plan : PHARMD MEDICATION MANAGEMENT  Updates made by Lavera Guise, [place] since 11/03/2020 12:00 AM     Problem: DISEASE PROGRESSION PREVENTION      Long-Range Goal: T2DM   This  Visit's Progress: Not on track  Priority: High  Note:   Current Barriers:  Unable to achieve control of T2DM  Suboptimal therapeutic regimen for T2DM  Pharmacist Clinical Goal(s):  Over the next 90 days, patient will achieve control of T2DM as evidenced by IMPROVED GLYCEMIC CONTROL  through collaboration with PharmD and provider.   Interventions: 1:1 collaboration with [name], DO regarding development and update of comprehensive plan of care as evidenced by provider attestation and co-signature Inter-disciplinary care team collaboration (see longitudinal plan of care) Comprehensive medication review performed; medication list updated in electronic medical record  Diabetes: Uncontrolled; current treatment: rybelsus, farxiga, glipizide;  A1C 7.2% (IMPROVING) GFR 44 Denies personal and family history of Medullary thyroid cancer (MTC) Work to d/c glipizide Current glucose readings: fasting glucose: 191, 200, 201, 218, 217, 286, 320, 228, 198, post prandial glucose: n/a Denies hypoglycemic/hyperglycemic symptoms Discussed meal planning options and Plate method for healthy eating Avoid sugary drinks and desserts Incorporate balanced protein, non starchy veggies, 1 serving of carbohydrate with each meal Increase water intake Increase physical activity as able Current exercise: N/A Educated on MEDICATIONS (PURPOSE AND SIDE EFFECTS); INCREASED DOSE OF  Recommended INCREASE TO RYBELSUS 7MG AFTER 3MG SAMPLE PACK  Patient Goals/Self-Care Activities Over the next 90 days, patient will:  - take medications as prescribed check glucose DAILY (FASTING) OR IF SYMPTOMATIC, document, and provide at future appointments  Follow Up Plan: Telephone follow up appointment with care management team member scheduled for: 1 month      Medication Assistance: None required.  Patient affirms current coverage meets needs.  Patient's preferred pharmacy is:  [facility] - [address], [address] [place] [address] Phone: [phone] Fax: [phone]  Uses pill box? No - n/a Pt endorses 100% compliance, wife assists with medications  Follow Up:  Patient agrees to Care Plan and Follow-up.  Plan: Telephone follow up appointment with care management team member scheduled for:  1 month  Regina Eck, PharmD, BCPS Clinical Pharmacist, [place]  II Phone [phone]

## 2020-10-25 ENCOUNTER — Encounter: Payer: Self-pay | Admitting: Nurse Practitioner

## 2020-10-25 ENCOUNTER — Ambulatory Visit (INDEPENDENT_AMBULATORY_CARE_PROVIDER_SITE_OTHER): Payer: Medicare Other | Admitting: Nurse Practitioner

## 2020-10-25 DIAGNOSIS — J069 Acute upper respiratory infection, unspecified: Secondary | ICD-10-CM | POA: Insufficient documentation

## 2020-10-25 MED ORDER — DM-GUAIFENESIN ER 30-600 MG PO TB12: 1.0000 | ORAL_TABLET | Freq: Two times a day (BID) | ORAL | 0 refills | Status: DC

## 2020-10-25 MED ORDER — PREDNISONE 10 MG (21) PO TBPK: ORAL_TABLET | ORAL | 0 refills | Status: DC

## 2020-10-25 MED ORDER — BENZONATATE 100 MG PO CAPS: 100.0000 mg | ORAL_CAPSULE | Freq: Three times a day (TID) | ORAL | 0 refills | Status: DC | PRN

## 2020-10-25 NOTE — Progress Notes (Signed)
   Virtual Visit  Note Due to COVID-19 pandemic this visit was conducted virtually. This visit type was conducted due to national recommendations for restrictions regarding the COVID-19 Pandemic (e.g. social distancing, sheltering in place) in an effort to limit this patient's exposure and mitigate transmission in our community. All issues noted in this document were discussed and addressed.  A physical exam was not performed with this format.  I connected with Matthew Campos on 10/25/20 at at 8:10 AM by telephone and verified that I am speaking with the correct person using two identifiers. Matthew Campos is currently located at home and spouse is with patient during visit. The provider, Ivy Lynn, NP is located in their office at time of visit.  I discussed the limitations, risks, security and privacy concerns of performing an evaluation and management service by telephone and the availability of in person appointments. I also discussed with the patient that there may be a patient responsible charge related to this service. The patient expressed understanding and agreed to proceed.   History and Present Illness:  URI  This is a recurrent problem. The current episode started 1 to 4 weeks ago. The problem has been unchanged. There has been no fever. Associated symptoms include coughing and nausea. Pertinent negatives include no congestion, rash, sore throat, swollen glands or vomiting. He has tried decongestant for the symptoms. The treatment provided mild relief.     Review of Systems  HENT:  Negative for congestion and sore throat.   Respiratory:  Positive for cough.   Gastrointestinal:  Positive for nausea. Negative for vomiting.  Skin:  Negative for rash.    Observations/Objective: Televisit patient did not sound to be in distress   Assessment and Plan: Upper respiratory infection symptoms not well controlled in the last 1 to 2 weeks.  With increased coughing, no headaches,  fever, sore throat, ear ache or chills.  Encourage patient to increase hydration, use Tylenol for any headache or fever in the future, prednisone taper, guaifenesin for cough and congestion, benzonatate.  Follow Up Instructions: Follow-up with worsening unresolved symptoms.    I discussed the assessment and treatment plan with the patient. The patient was provided an opportunity to ask questions and all were answered. The patient agreed with the plan and demonstrated an understanding of the instructions.   The patient was advised to call back or seek an in-person evaluation if the symptoms worsen or if the condition fails to improve as anticipated.  The above assessment and management plan was discussed with the patient. The patient verbalized understanding of and has agreed to the management plan. Patient is aware to call the clinic if symptoms persist or worsen. Patient is aware when to return to the clinic for a follow-up visit. Patient educated on when it is appropriate to go to the emergency department.   Time call ended: 8:20 AM  I provided 10 minutes of  non face-to-face time during this encounter.    Ivy Lynn, NP

## 2020-10-25 NOTE — Assessment & Plan Note (Signed)
Upper respiratory infection symptoms not well controlled in the last 1 to 2 weeks.  With increased coughing, no headaches, fever, sore throat, ear ache or chills.  Encourage patient to increase hydration, use Tylenol for any headache or fever in the future, prednisone taper, guaifenesin for cough and congestion, benzonatate.

## 2020-10-31 ENCOUNTER — Other Ambulatory Visit: Payer: Self-pay | Admitting: Family Medicine

## 2020-10-31 DIAGNOSIS — J449 Chronic obstructive pulmonary disease, unspecified: Secondary | ICD-10-CM | POA: Diagnosis not present

## 2020-11-01 ENCOUNTER — Other Ambulatory Visit: Payer: Self-pay | Admitting: Family Medicine

## 2020-11-01 DIAGNOSIS — K5909 Other constipation: Secondary | ICD-10-CM

## 2020-11-01 DIAGNOSIS — I5033 Acute on chronic diastolic (congestive) heart failure: Secondary | ICD-10-CM

## 2020-11-02 ENCOUNTER — Ambulatory Visit: Payer: Medicare Other | Admitting: Pharmacist

## 2020-11-02 ENCOUNTER — Telehealth: Payer: Self-pay | Admitting: Family Medicine

## 2020-11-02 DIAGNOSIS — J449 Chronic obstructive pulmonary disease, unspecified: Secondary | ICD-10-CM

## 2020-11-02 MED ORDER — ALBUTEROL SULFATE HFA 108 (90 BASE) MCG/ACT IN AERS: 2.0000 | INHALATION_SPRAY | Freq: Four times a day (QID) | RESPIRATORY_TRACT | 3 refills | Status: DC | PRN

## 2020-11-02 NOTE — Telephone Encounter (Signed)
  Prescription Request  11/02/2020  What is the name of the medication or equipment? albuterol (VENTOLIN HFA) 108 (90 Base) MCG/ACT inhaler--2 inhalers  Have you contacted your pharmacy to request a refill? (if applicable) no  Which pharmacy would you like this sent to? [facility]   Patient notified that their request is being sent to the clinical staff for review and that they should receive a response within 2 business days.

## 2020-11-03 NOTE — Patient Instructions (Signed)
Visit Information  PATIENT GOALS:  Goals Addressed               This Visit's Progress     Patient Stated     T2DM-PharmD goal (pt-stated)        Current Barriers:  Unable to achieve control of T2DM  Suboptimal therapeutic regimen for T2DM  Pharmacist Clinical Goal(s):  Over the next 90 days, patient will achieve control of T2DM as evidenced by IMPROVED GLYCEMIC CONTROL through collaboration with PharmD and provider.   Interventions: 1:1 collaboration with Matthew Norlander, DO regarding development and update of comprehensive plan of care as evidenced by provider attestation and co-signature Inter-disciplinary care team collaboration (see longitudinal plan of care) Comprehensive medication review performed; medication list updated in electronic medical record  Diabetes: Uncontrolled; current treatment: rybelsus, farxiga, glipizide;  A1C 7.2% (IMPROVING) GFR 44 Denies personal and family history of Medullary thyroid cancer (MTC) Work to d/c glipizide Current glucose readings: fasting glucose: 191, 200, 201, 218, 217, 286, 320, 228, 198, post prandial glucose: n/a Denies hypoglycemic/hyperglycemic symptoms Discussed meal planning options and Plate method for healthy eating Avoid sugary drinks and desserts Incorporate balanced protein, non starchy veggies, 1 serving of carbohydrate with each meal Increase water intake Increase physical activity as able Current exercise: N/A Educated on MEDICATIONS (PURPOSE AND SIDE EFFECTS); INCREASED DOSE OF  Recommended INCREASE TO RYBELSUS '7MG'$  AFTER '3MG'$  SAMPLE PACK  Patient Goals/Self-Care Activities Over the next 90 days, patient will:  - take medications as prescribed check glucose DAILY (FASTING) OR IF SYMPTOMATIC, document, and provide at future appointments  Follow Up Plan: Telephone follow up appointment with care management team member scheduled for: 1 month         The patient verbalized understanding of instructions,  educational materials, and care plan provided today and declined offer to receive copy of patient instructions, educational materials, and care plan.   Telephone follow up appointment with care management team member scheduled for: 11/07/20  Matthew Campos, PharmD, BCPS Clinical Pharmacist, [place]  II Phone [phone]

## 2020-11-07 ENCOUNTER — Telehealth: Payer: Self-pay

## 2020-11-17 ENCOUNTER — Ambulatory Visit: Payer: Medicare Other | Admitting: Family Medicine

## 2020-11-21 ENCOUNTER — Ambulatory Visit (INDEPENDENT_AMBULATORY_CARE_PROVIDER_SITE_OTHER): Payer: Medicare Other | Admitting: Pharmacist

## 2020-11-21 DIAGNOSIS — E1169 Type 2 diabetes mellitus with other specified complication: Secondary | ICD-10-CM

## 2020-11-21 MED ORDER — RYBELSUS 7 MG PO TABS: 7.0000 mg | ORAL_TABLET | Freq: Every day | ORAL | 2 refills | Status: DC

## 2020-11-21 NOTE — Progress Notes (Signed)
Chronic Care Management Pharmacy Note  11/21/2020 Name:  Matthew Campos MRN:  [id number] DOB:  February 26, 1941  Summary: diabetes  Recommendations/Changes made from today's visit: Diabetes: Uncontrolled; current treatment: rybelsus, farxiga, glipizide;  A1C 7.2%, GFR 44 Denies personal and family history of Medullary thyroid cancer (MTC) Patient still reports wide range of blood sugars 150-300. Counseled on diet.  He is taking rybelsus as prescribed.  Will continue 17m.  Patient has supply at home and back up RX was sent to the drug store.  We may have to augment therapy if Bgs stay >200 Current glucose readings: fasting glucose: 150-300;  post prandial glucose: n/a Denies hypoglycemic/hyperglycemic symptoms Discussed meal planning options and Plate method for healthy eating Avoid sugary drinks and desserts Incorporate balanced protein, non starchy veggies, 1 serving of carbohydrate with each meal Increase water intake Increase physical activity as able Current exercise: N/A Educated on MEDICATIONS (PURPOSE AND SIDE EFFECTS); INCREASED DOSE OF  Recommended INCREASE TO RYBELSUS 7MG AFTER 3MG SAMPLE PACK  Follow Up Plan: Telephone follow up appointment with care management team member scheduled for: 1 month  Subjective: Matthew DUMLAOis an 80y.o. year old male who is a primary patient of GJanora Norlander DO.  The CCM team was consulted for assistance with disease management and care coordination needs.    Engaged with patient by telephone for follow up visit in response to provider referral for pharmacy case management and/or care coordination services.   Consent to Services:  The patient was given information about Chronic Care Management services, agreed to services, and gave verbal consent prior to initiation of services.  Please see initial visit note for detailed documentation.   Patient Care Team: GJanora Norlander DO as PCP - General (Family Medicine) KCurly Rim MD as Referring Physician (Internal Medicine) GJanora Norlander DO (Family Medicine) FKatha Cabal LCSW as Social Worker (Licensed Clinical Social Worker) PLottie DawsonD, [facility](Pharmacist)   Objective:  Lab Results  Component Value Date   CREATININE 1.59 (H) 10/13/2020   CREATININE 1.67 (H) 05/30/2020   CREATININE 1.71 (H) 02/04/2020    Lab Results  Component Value Date   HGBA1C 7.2 (H) 10/13/2020   Last diabetic Eye exam:  Lab Results  Component Value Date/Time   HMDIABEYEEXA No Retinopathy 02/15/2020 12:00 AM    Last diabetic Foot exam: No results found for: HMDIABFOOTEX      Component Value Date/Time   CHOL 146 10/13/2020 1055   TRIG 340 (H) 10/13/2020 1055   HDL 35 (L) 10/13/2020 1055   CHOLHDL 4.2 10/13/2020 1055   CHOLHDL 3.3 04/13/2019 1722   VLDL 18 04/13/2019 1722   LDLCALC 58 10/13/2020 1055    Hepatic Function Latest Ref Rng & Units 10/13/2020 05/30/2020 02/04/2020  Total Protein 6.0 - 8.5 g/dL 7.3 - 7.6  Albumin 3.7 - 4.7 g/dL 4.5 4.7 4.4  AST 0 - 40 IU/L 16 - 15  ALT 0 - 44 IU/L 16 - 12  Alk Phosphatase 44 - 121 IU/L 68 - 103  Total Bilirubin 0.0 - 1.2 mg/dL 0.5 - 0.5    Lab Results  Component Value Date/Time   TSH 1.630 10/13/2020 10:55 AM   TSH 1.860 05/30/2020 10:05 AM   FREET4 1.52 10/13/2020 10:55 AM   FREET4 0.53 (L) 04/14/2019 01:11 AM    CBC Latest Ref Rng & Units 10/13/2020 05/30/2020 02/04/2020  WBC 3.4 - 10.8 x10E3/uL 8.6 9.3 8.5  Hemoglobin 13.0 - 17.7 g/dL 15.4  14.8 13.3  Hematocrit 37.5 - 51.0 % 46.8 46.9 41.0  Platelets 150 - 450 x10E3/uL 146(L) 155 172    No results found for: VD25OH  Clinical ASCVD: Yes  The ASCVD Risk score Mikey Bussing DC Jr., et al., 2013) failed to calculate for the following reasons:   The 2013 ASCVD risk score is only valid for ages 21 to 18   The patient has a prior MI or stroke diagnosis    Other: (CHADS2VASc if Afib, PHQ9 if depression, MMRC or CAT for COPD, ACT, DEXA)  Social History    Tobacco Use  Smoking Status Former  Smokeless Tobacco Never  Tobacco Comments   quit smoking 20+yrs ago   BP Readings from Last 3 Encounters:  10/13/20 122/69  06/27/20 112/71  05/30/20 122/72   Pulse Readings from Last 3 Encounters:  10/13/20 75  06/27/20 70  05/30/20 76   Wt Readings from Last 3 Encounters:  10/13/20 192 lb 6.4 oz (87.3 kg)  07/26/20 196 lb (88.9 kg)  06/27/20 196 lb (88.9 kg)    Assessment: Review of patient past medical history, allergies, medications, health status, including review of consultants reports, laboratory and other test data, was performed as part of comprehensive evaluation and provision of chronic care management services.   SDOH:  (Social Determinants of Health) assessments and interventions performed:    CCM Care Plan  Allergies  Allergen Reactions   Codeine Nausea And Vomiting   Codeine    Latex Hives and Itching   Lisinopril    Lisinopril Cough    Medications Reviewed Today     Reviewed by Lavera Guise, [facility] (Pharmacist) on 11/21/20 at 1122  Med List Status: <None>   Medication Order Taking? Sig Documenting Provider Last Dose Status Informant  acetaZOLAMIDE (DIAMOX) 250 MG tablet [id number] No Take by mouth. [provider] Taking Expired 06/13/19 2359   albuterol (PROVENTIL) (2.5 MG/3ML) 0.083% nebulizer solution [id number] No Take 2.5 mg by nebulization every 6 (six) hours as needed for wheezing or shortness of breath. [provider] Taking Active Spouse/Significant Other  albuterol (VENTOLIN HFA) 108 (90 Base) MCG/ACT inhaler [id number]  Inhale 2 puffs into the lungs every 6 (six) hours as needed for wheezing or shortness of breath. Ronnie Doss M, DO  Active   aspirin EC 81 MG tablet [id number] No Take 81 mg by mouth daily. [provider] Taking Active Spouse/Significant Other           Med Note Bradly Chris, Gloris [place]   Tue Sep 07, 2019  9:28 AM)    benzonatate (TESSALON PERLES) 100 MG capsule  [id number]  Take 1 capsule (100 mg total) by mouth 3 (three) times daily as needed for cough. Ivy Lynn, NP  Active   calcium carbonate (OSCAL) 1500 (600 Ca) MG TABS tablet [id number] No Take by mouth 2 (two) times daily with a meal. [provider] Taking Active Spouse/Significant Other  clopidogrel (PLAVIX) 75 MG tablet [id number] No TAKE ONE (1) TABLET EACH DAY Gottschalk, [place] M, DO Taking Active   dextromethorphan-guaiFENesin (MUCINEX DM) 30-600 MG 12hr tablet [id number]  Take 1 tablet by mouth 2 (two) times daily. Ivy Lynn, NP  Active   FARXIGA 10 MG TABS tablet [id number] No TAKE ONE TABLET EACH MORNING BEFORE BREAKFAST Ronnie Doss M, [place] Taking Active   ferrous sulfate 325 (65 FE) MG EC tablet [id number] No Take 325 mg by mouth every Monday, Wednesday, and Friday.  [provider] Taking Active Spouse/Significant Other  glipiZIDE (GLUCOTROL) 5 MG tablet [id number] No TAKE 1 TABLET TWICE A DAY BEFORE A MEAL Gottschalk, Ashly M, DO Taking Active   levothyroxine (SYNTHROID) 100 MCG tablet [id number] No TAKE ONE (1) TABLET EACH DAY Gottschalk, [place], DO Taking Active   lubiprostone (AMITIZA) 24 MCG capsule [id number]  TAKE 1 CAPSULE TWICE A DAY WITH MEALS. Ronnie Doss M, DO  Active   mirtazapine (REMERON) 7.5 MG tablet [id number]  Take 1 tablet (7.5 mg total) by mouth at bedtime. STOP Jerelyn Scott M, DO  Active   Multiple Vitamin (MULTIVITAMIN WITH MINERALS) TABS [id number] No Take 1 tablet by mouth daily. [provider] Taking Active Spouse/Significant Other  nitroGLYCERIN (NITROSTAT) 0.4 MG SL tablet [id number] No Place 0.4 mg under the tongue every 5 (five) minutes as needed. For chest pain [provider] Taking Active Spouse/Significant Other  ondansetron (ZOFRAN-ODT) 4 MG disintegrating tablet [id number] No TAKE 1 TABLET EVERY 8 HOURS AS NEEDED FOR NAUSEA AND VOMITING Ronnie Doss M, DO Taking Active   pantoprazole (PROTONIX)  40 MG tablet [id number] No TAKE ONE (1) [address] Ronnie Doss [place], [place] Taking Active     Discontinued 11/21/20 1122 (Change in therapy)   rosuvastatin (CRESTOR) 10 MG tablet [id number]  TAKE ONE (1) TABLET EACH DAY [place], Ashly M, DO  Active   sodium chloride (OCEAN) 0.65 % nasal spray [id number] No Place into the nose.  Patient not taking: No sig reported   [provider] Not Taking Active   spironolactone (ALDACTONE) 25 MG tablet [id number] No Take 25 mg by mouth daily.  [provider] 09/06/2019 Unknown time Expired 09/07/19 2359 Spouse/Significant Other  SYMBICORT 80-4.5 MCG/ACT inhaler [id number] No Inhale 2 puffs into the lungs 2 (two) times daily as needed. [provider] Taking Active Spouse/Significant Other  torsemide (DEMADEX) 20 MG tablet [id number]  TAKE TWO TABLETS BY MOUTH TWICE DAILY [name], DO  Active   traZODone (DESYREL) 50 MG tablet [id number] No TAKE 1/2 TO 1 TABLET AT BEDTIME AS NEEDED FOR SLEEP [name], DO Taking Active             Patient Active Problem List   Diagnosis Date Noted   Viral upper respiratory tract infection 10/25/2020   Hyperlipidemia associated with type 2 diabetes mellitus ([place]) 02/04/2020   CAP (community acquired pneumonia) 09/07/2019   Hypomagnesemia 09/07/2019   CKD (chronic kidney disease), stage III ([place]) 09/07/2019   Volume overload 09/07/2019   Community acquired pneumonia 09/07/2019   GI bleeding 04/14/2019   Acute on chronic diastolic heart failure (HCC)    AKI (acute kidney injury) ([place])    Anemia due to blood loss    Bradycardia    Renal insufficiency    UGI bleed 04/13/2019   Chest pain 04/13/2019   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/13/2019   Cholecystostomy care ([place]) 04/13/2019   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   COPD (chronic obstructive pulmonary disease) (HCC)    Coronary artery disease    Chronic ischemic heart disease     Paroxysmal atrial fibrillation (HCC)    Cholecystitis    OSA (obstructive sleep apnea)    Coagulopathy (HCC)    Chronic anticoagulation    Dysphagia    Elevated alkaline phosphatase measurement    Elevated bilirubin    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   Sinus bradycardia 02/18/2019   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   Normocytic anemia 12/23/2018  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[place]) 12/23/2018   OSA (obstructive sleep apnea) 12/23/2018   Severe aortic stenosis 11/26/2018   Anxiety 10/28/2018   CHF (congestive heart failure) ([place]) 10/28/2018   At risk for falls 10/28/2018   Oxygen dependent 10/28/2018   Major depressive disorder, single episode, severe without psychotic features ([place]) 07/21/2017   Thrombocytopenia ([place]) 07/21/2017   COPD (chronic obstructive pulmonary disease) ([place]) 12/27/2015   CAD (coronary artery disease) 12/27/2015   Gastroesophageal reflux disease 12/27/2015   Hypertension associated with diabetes ([place]) 12/27/2015   Hyperlipidemia 09/09/2013   Type 2 diabetes mellitus ([place]) 09/09/2013    Immunization History  Administered Date(s) Administered   Fluad Quad(high Dose 65+) 02/04/2020   Influenza, High Dose Seasonal PF 02/29/2016, 01/20/2017, 02/10/2018   Influenza, Seasonal, Injecte, Preservative Fre 02/03/2014   Influenza,inj,Quad PF,6+ Mos 01/01/2019   Influenza-Unspecified 02/03/2014, 02/29/2016, 01/20/2017, 02/10/2018   Moderna Sars-Covid-2 Vaccination 07/05/2019, 08/02/2019, 02/23/2020   Pneumococcal Conjugate-13 11/25/2014, 11/25/2014   Pneumococcal Polysaccharide-23 05/30/2016, 05/30/2016, 01/01/2019   Tdap 11/25/2014, 11/25/2014    Conditions to be addressed/monitored: DMII  Care Plan : PHARMD MEDICATION MANAGEMENT  Updates made by Lavera Guise, [place] since 11/21/2020 12:00 AM     Problem: DISEASE PROGRESSION PREVENTION      Long-Range Goal: T2DM   Recent Progress: Not on track   Priority: High  Note:   Current Barriers:  Unable to achieve control of T2DM  Suboptimal therapeutic regimen for T2DM  Pharmacist Clinical Goal(s):  Over the next 90 days, patient will achieve control of T2DM as evidenced by IMPROVED GLYCEMIC CONTROL  through collaboration with PharmD and provider.   Interventions: 1:1 collaboration with [name], DO regarding development and update of comprehensive plan of care as evidenced by provider attestation and co-signature Inter-disciplinary care team collaboration (see longitudinal plan of care) Comprehensive medication review performed; medication list updated in electronic medical record  Diabetes: Uncontrolled; current treatment: rybelsus, farxiga, glipizide;  A1C 7.2% (IMPROVING) GFR 44 Denies personal and family history of Medullary thyroid cancer (MTC) Work to d/c glipizide Patient still reports wide range of blood sugars 150-300. Counseled on diet.  He is taking rybelsus as prescribed.  Will continue 76m.  Patient has supply at home and back up RX was sent to the drug store Current glucose readings: fasting glucose: 150-300;  post prandial glucose: n/a Denies hypoglycemic/hyperglycemic symptoms Discussed meal planning options and Plate method for healthy eating Avoid sugary drinks and desserts Incorporate balanced protein, non starchy veggies, 1 serving of carbohydrate with each meal Increase water intake Increase physical activity as able Current exercise: N/A Educated on MEDICATIONS (PURPOSE AND SIDE EFFECTS); INCREASED DOSE OF  Recommended INCREASE TO RYBELSUS 7MG AFTER 3MG SAMPLE PACK  Patient Goals/Self-Care Activities Over the next 90 days, patient will:  - take medications as prescribed check glucose DAILY (FASTING) OR IF SYMPTOMATIC, document, and provide at future appointments  Follow Up Plan: Telephone follow up appointment with care management team member scheduled for: 1 month      Medication  Assistance: None required.  Patient affirms current coverage meets needs.  [facility] [address] [address] [place]Phone: [phone]: [phone] Uses pill box? Yes Pt endorses 100% compliance-[place] arranges all of patient's medicine  Follow Up:  Patient agrees to Care Plan and Follow-up.  Plan: Telephone follow up appointment with care management team member scheduled for:  1 month  JCleotilde NeerPMelville  PharmD, BCPS Clinical Pharmacist, [place]  II Phone [phone]

## 2020-11-21 NOTE — Patient Instructions (Signed)
Visit Information  PATIENT GOALS:  Goals Addressed               This Visit's Progress     Patient Stated     T2DM-PharmD goal (pt-stated)        Current Barriers:  Unable to achieve control of T2DM  Suboptimal therapeutic regimen for T2DM  Pharmacist Clinical Goal(s):  Over the next 90 days, patient will achieve control of T2DM as evidenced by IMPROVED GLYCEMIC CONTROL through collaboration with PharmD and provider.   Interventions: 1:1 collaboration with [name], DO regarding development and update of comprehensive plan of care as evidenced by provider attestation and co-signature Inter-disciplinary care team collaboration (see longitudinal plan of care) Comprehensive medication review performed; medication list updated in electronic medical record  Diabetes: Uncontrolled; current treatment: rybelsus, farxiga, glipizide;  A1C 7.2% (IMPROVING) GFR 44 Denies personal and family history of Medullary thyroid cancer (MTC) Work to d/c glipizide Patient still reports wide range of blood sugars 150-300. Counseled on diet.  He is taking rybelsus as prescribed.  Will continue '7mg'$ .  Patient has supply at home and back up RX was sent to the drug store Current glucose readings: fasting glucose: 150-300;  post prandial glucose: n/a Denies hypoglycemic/hyperglycemic symptoms Discussed meal planning options and Plate method for healthy eating Avoid sugary drinks and desserts Incorporate balanced protein, non starchy veggies, 1 serving of carbohydrate with each meal Increase water intake Increase physical activity as able Current exercise: N/A Educated on MEDICATIONS (PURPOSE AND SIDE EFFECTS); INCREASED DOSE OF  Recommended INCREASE TO RYBELSUS '7MG'$  AFTER '3MG'$  SAMPLE PACK  Patient Goals/Self-Care Activities Over the next 90 days, patient will:  - take medications as prescribed check glucose DAILY (FASTING) OR IF SYMPTOMATIC, document, and provide at future  appointments  Follow Up Plan: Telephone follow up appointment with care management team member scheduled for: 1 month         The patient verbalized understanding of instructions, educational materials, and care plan provided today and declined offer to receive copy of patient instructions, educational materials, and care plan.   Telephone follow up appointment with care management team member scheduled for: 1 month  Signature Regina Eck, PharmD, BCPS Clinical Pharmacist, [place]  II Phone [phone]

## 2020-12-01 ENCOUNTER — Other Ambulatory Visit: Payer: Self-pay | Admitting: Family Medicine

## 2020-12-01 DIAGNOSIS — J449 Chronic obstructive pulmonary disease, unspecified: Secondary | ICD-10-CM | POA: Diagnosis not present

## 2020-12-01 DIAGNOSIS — D500 Iron deficiency anemia secondary to blood loss (chronic): Secondary | ICD-10-CM

## 2020-12-04 ENCOUNTER — Ambulatory Visit (INDEPENDENT_AMBULATORY_CARE_PROVIDER_SITE_OTHER): Payer: Medicare Other | Admitting: Nurse Practitioner

## 2020-12-04 ENCOUNTER — Encounter: Payer: Self-pay | Admitting: Nurse Practitioner

## 2020-12-04 DIAGNOSIS — W57XXXA Bitten or stung by nonvenomous insect and other nonvenomous arthropods, initial encounter: Secondary | ICD-10-CM

## 2020-12-04 DIAGNOSIS — S80862A Insect bite (nonvenomous), left lower leg, initial encounter: Secondary | ICD-10-CM

## 2020-12-04 MED ORDER — DOXYCYCLINE HYCLATE 100 MG PO TABS: 100.0000 mg | ORAL_TABLET | Freq: Two times a day (BID) | ORAL | 0 refills | Status: DC

## 2020-12-04 NOTE — Progress Notes (Signed)
   Virtual Visit  Note Due to COVID-19 pandemic this visit was conducted virtually. This visit type was conducted due to national recommendations for restrictions regarding the COVID-19 Pandemic (e.g. social distancing, sheltering in place) in an effort to limit this patient's exposure and mitigate transmission in our community. All issues noted in this document were discussed and addressed.  A physical exam was not performed with this format.  I connected with Matthew Campos on 12/04/20 at 5:06 by telephone and verified that I am speaking with the correct person using two identifiers. Matthew Campos is currently located at home and his wife is currently with him during visit. The provider, Mary-Margaret Hassell Done, FNP is located in their office at time of visit.  I discussed the limitations, risks, security and privacy concerns of performing an evaluation and management service by telephone and the availability of in person appointments. I also discussed with the patient that there may be a patient responsible charge related to this service. The patient expressed understanding and agreed to proceed.   History and Present Illness:   Chief Complaint: Tick Removal   HPI Patient says he is removing deer ticks off of hisself the last 2 days. His wife has picked a bunch of ticks off of him.      Review of Systems  Constitutional:  Negative for chills and fever.  Gastrointestinal:  Positive for nausea (just today).  Musculoskeletal:  Negative for myalgias.  Neurological:  Negative for dizziness and headaches.    Observations/Objective: Alert and oriented- answers all questions appropriately No distress   Assessment and Plan: Matthew Campos in today with chief complaint of Tick Removal   1. Tick bite, unspecified site, initial encounter Treat yard to get rid of the ticks Check yourself everyday\spray bug repellent on yourself before oing outside. - doxycycline (VIBRA-TABS) 100 MG  tablet; Take 1 tablet (100 mg total) by mouth 2 (two) times daily. 1 po bid  Dispense: 28 tablet; Refill: 0    Follow Up Instructions: prn    I discussed the assessment and treatment plan with the patient. The patient was provided an opportunity to ask questions and all were answered. The patient agreed with the plan and demonstrated an understanding of the instructions.   The patient was advised to call back or seek an in-person evaluation if the symptoms worsen or if the condition fails to improve as anticipated.  The above assessment and management plan was discussed with the patient. The patient verbalized understanding of and has agreed to the management plan. Patient is aware to call the clinic if symptoms persist or worsen. Patient is aware when to return to the clinic for a follow-up visit. Patient educated on when it is appropriate to go to the emergency department.   Time call ended:  5:17  I provided 11 minutes of  non face-to-face time during this encounter.    Mary-Margaret Hassell Done, FNP

## 2020-12-10 IMAGING — DX DG CHEST 1V PORT
1 series · 1 of 1 positions shown · non-contrast
Comparison: None.

CLINICAL DATA: Awoke from sleep with chest pain.

EXAM:
PORTABLE CHEST 1 VIEW

[chest ap grid]
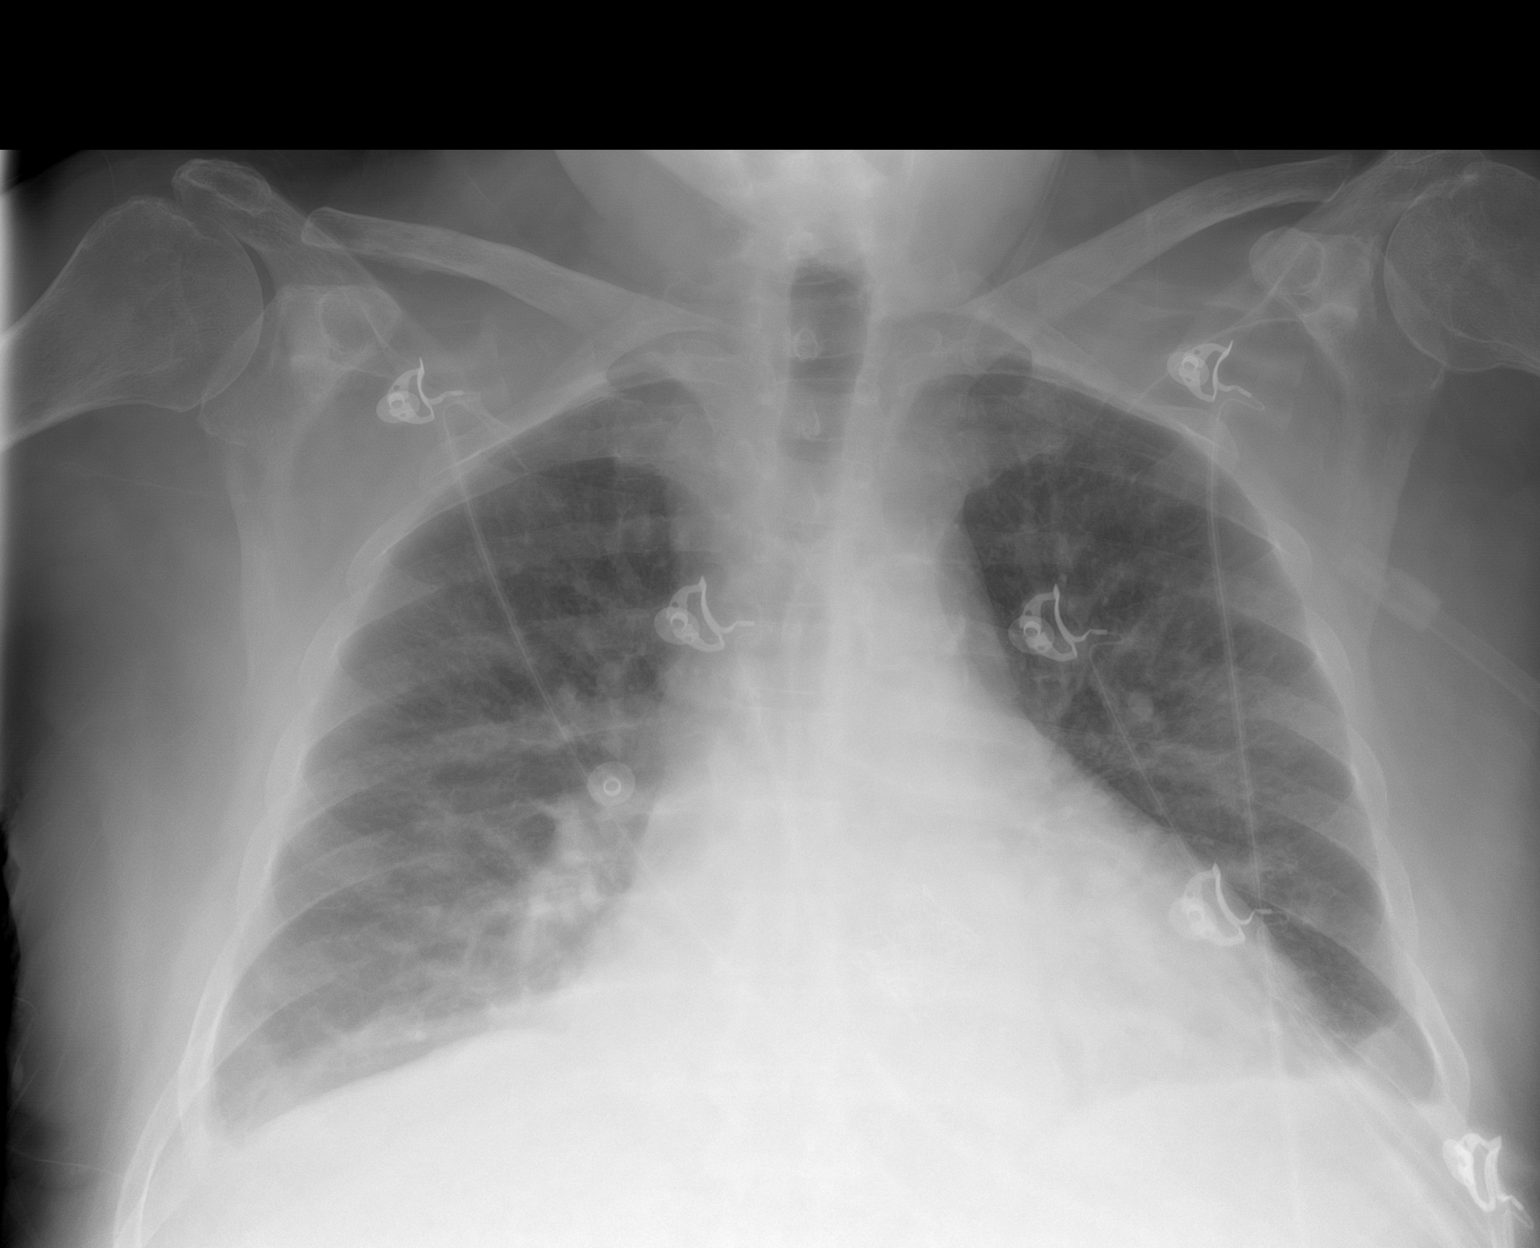

[1 of 1 positions shown; findings below may reference images not displayed]

FINDINGS: The heart is enlarged. Prosthetic aortic valve. Vascular congestion
with perivascular haziness. Suspected small bilateral pleural
effusions. No pneumothorax. No acute osseous abnormalities are seen.
Symmetric widening of the acromioclavicular joints.
IMPRESSION: Cardiomegaly with vascular congestion and perivascular haziness,
suspicious for pulmonary edema. Suspected small bilateral pleural
effusions. Overall findings suspicious for CHF.

## 2020-12-10 IMAGING — US US RENAL
1 series · 14 of 24 positions shown · non-contrast
Comparison: None

CLINICAL DATA: Acute on chronic kidney injury.

EXAM:
RENAL / URINARY TRACT ULTRASOUND COMPLETE

[Series 1: us renal · 14 of 24 slices shown]
[im 1/24]
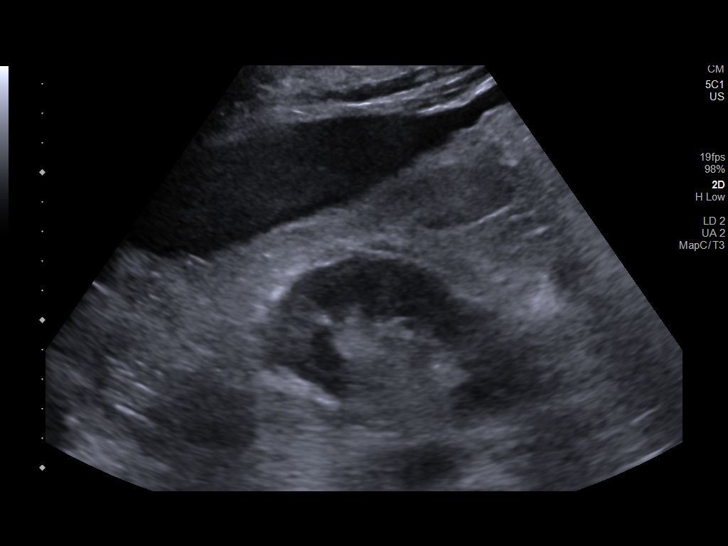
[im 3/24]
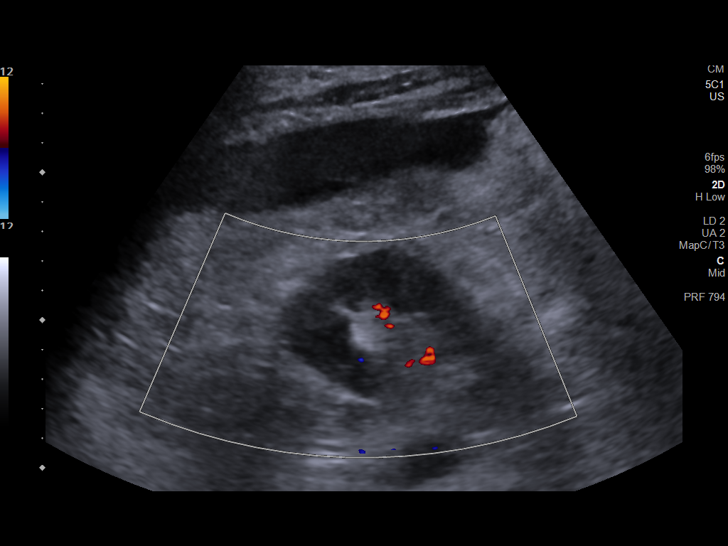
[im 5/24]
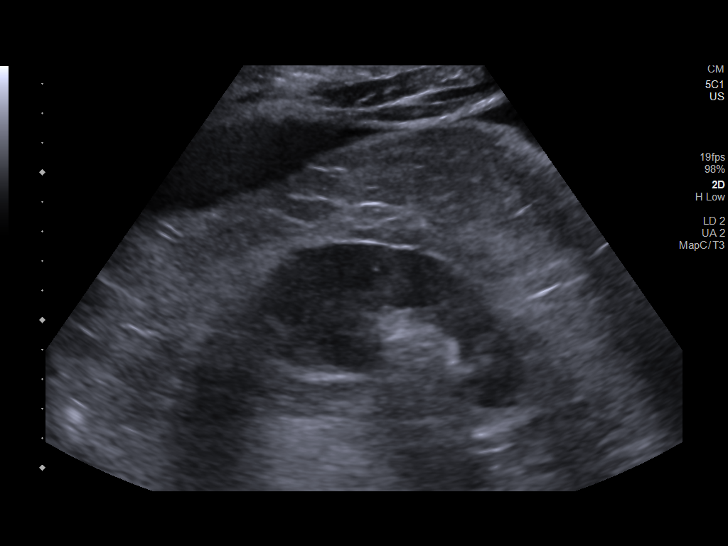
[im 7/24]
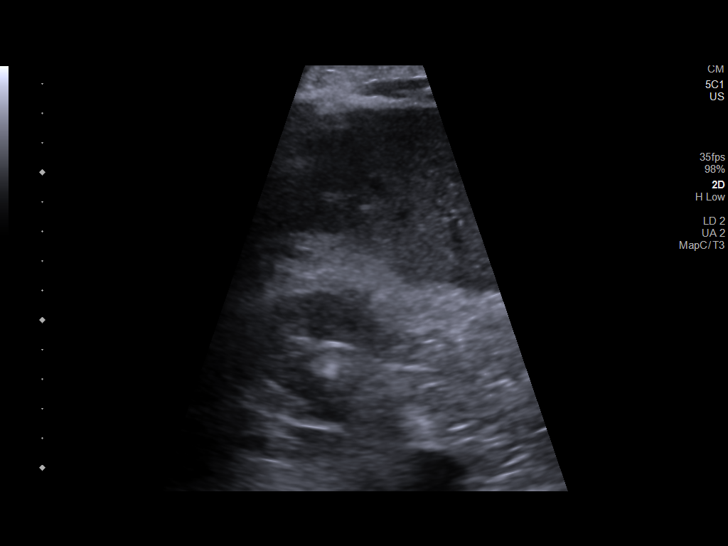
[im 8/24]
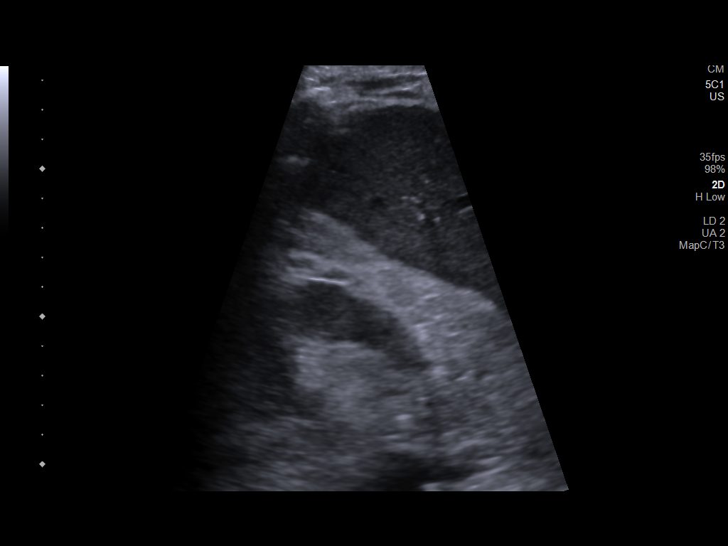
[im 10/24]
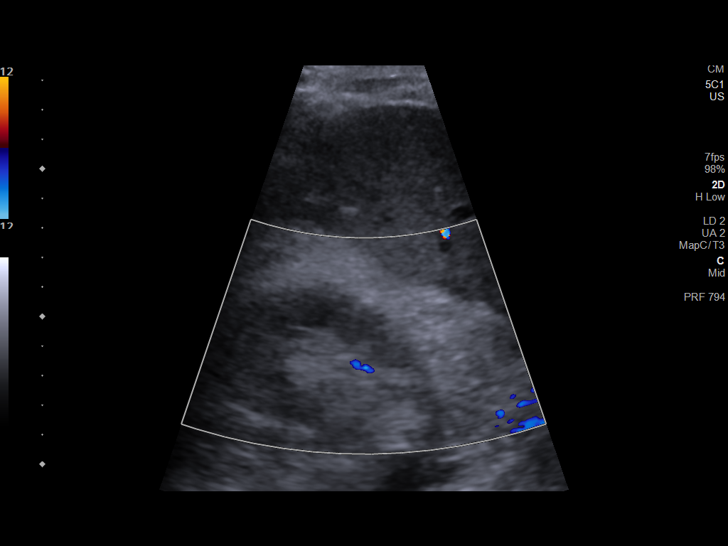
[im 12/24]
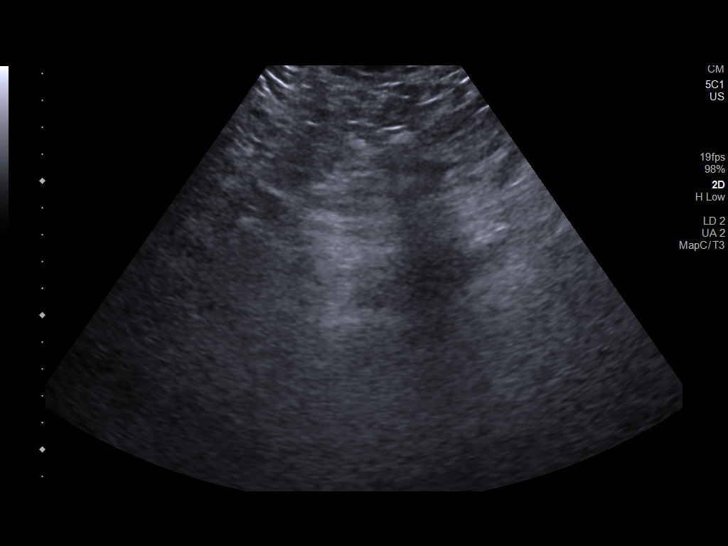
[im 13/24]
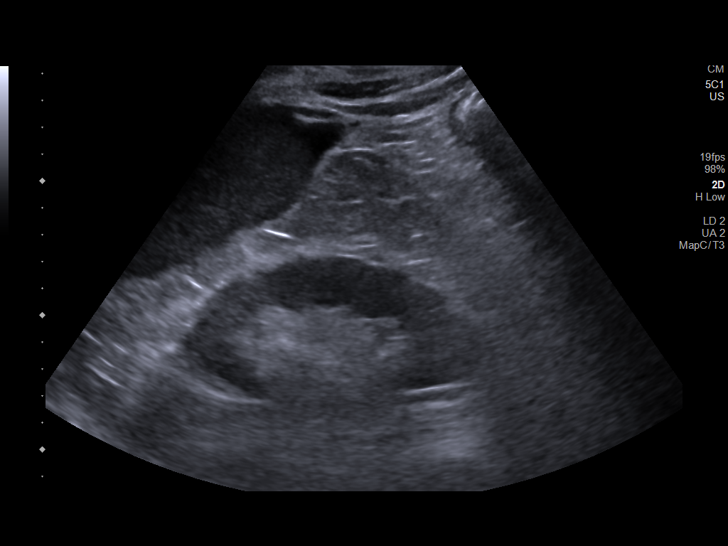
[im 15/24]
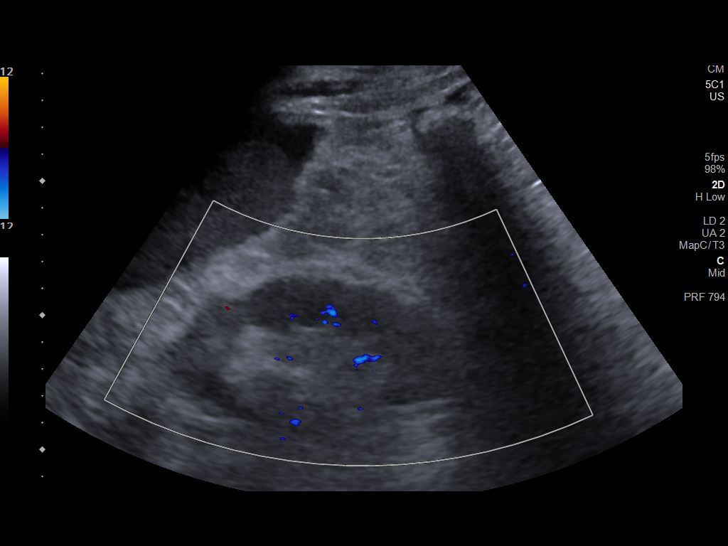
[im 17/24]
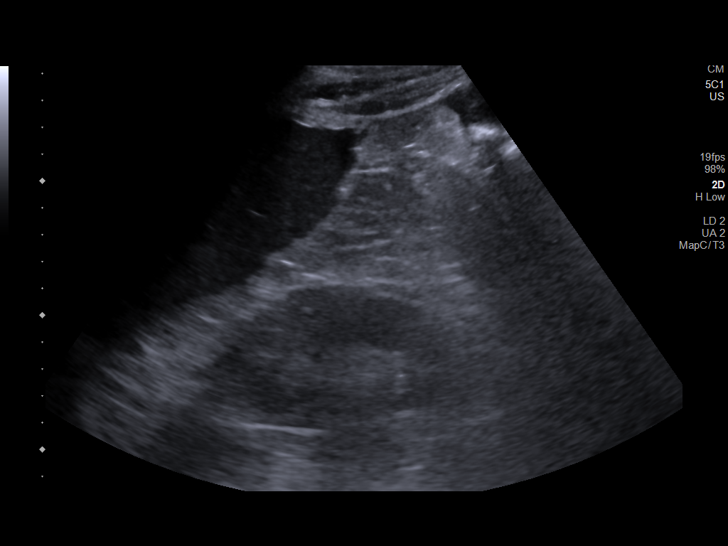
[im 19/24]
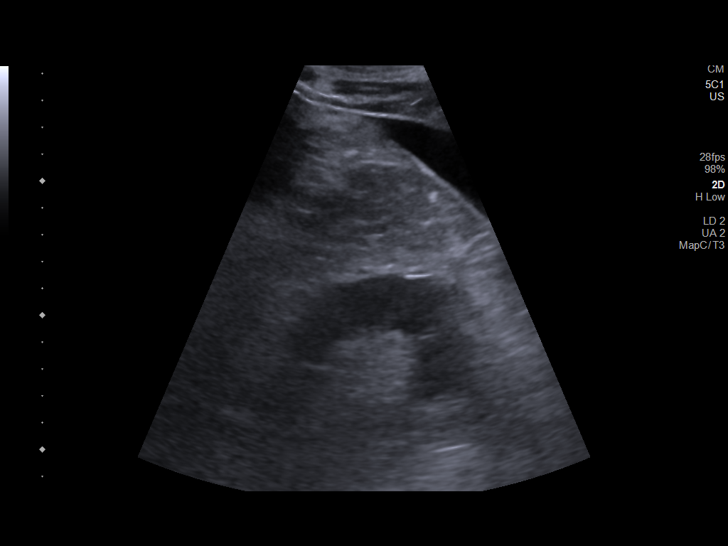
[im 20/24]
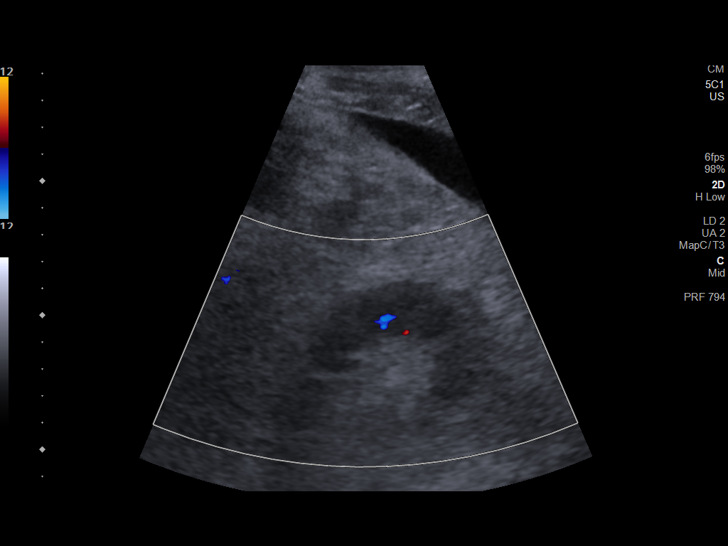
[im 22/24]
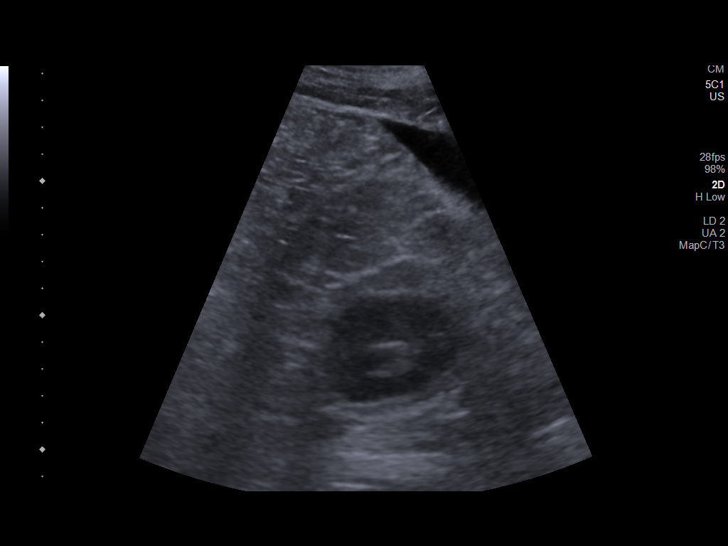
[im 24/24]
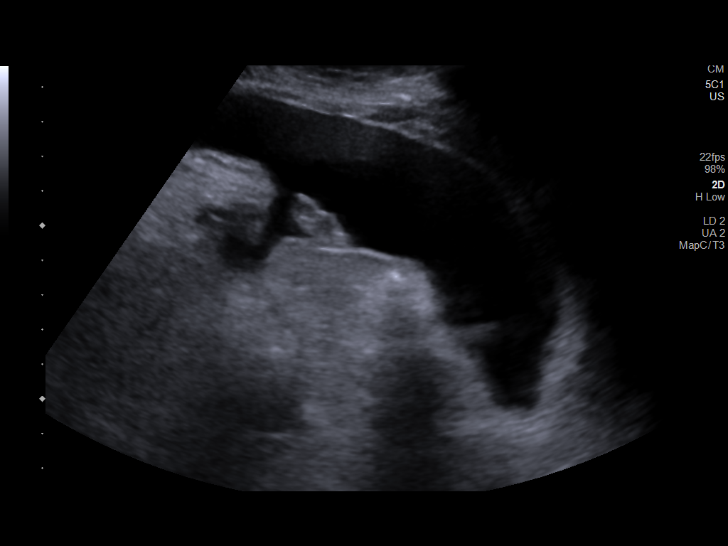

[14 of 24 positions shown; findings below may reference images not displayed]

FINDINGS: Right Kidney:

Renal measurements: 10.5 x 5.0 x 5.2 cm = volume: 141.0 mL .
Echogenicity within normal limits. No mass or hydronephrosis
visualized.

Left Kidney:

Renal measurements: 11.7 by 5.3 x 6.0 cm = volume: 194.1 mL.
Echogenicity within normal limits. No mass or hydronephrosis
visualized.

Bladder:

Not visualized

Other:

Mild to moderate volume of ascites identified within the right lower
quadrant and left lower quadrant of the abdomen.
IMPRESSION: Normal appearance of the kidneys.  No hydronephrosis or mass.

Mild to moderate abdominal ascites

## 2020-12-12 ENCOUNTER — Ambulatory Visit: Payer: Medicare Other | Admitting: Pharmacist

## 2020-12-12 DIAGNOSIS — E1169 Type 2 diabetes mellitus with other specified complication: Secondary | ICD-10-CM

## 2020-12-12 MED ORDER — NITROGLYCERIN 0.4 MG SL SUBL: 0.4000 mg | SUBLINGUAL_TABLET | SUBLINGUAL | 2 refills | Status: DC | PRN

## 2020-12-12 NOTE — Patient Instructions (Signed)
Visit Information  PATIENT GOALS:  Goals Addressed               This Visit's Progress     Patient Stated     T2DM-PharmD goal (pt-stated)        Current Barriers:  Unable to achieve control of T2DM  Suboptimal therapeutic regimen for T2DM  Pharmacist Clinical Goal(s):  Over the next 90 days, patient will achieve control of T2DM as evidenced by IMPROVED GLYCEMIC CONTROL through collaboration with PharmD and provider.   Interventions: 1:1 collaboration with [name], DO regarding development and update of comprehensive plan of care as evidenced by provider attestation and co-signature Inter-disciplinary care team collaboration (see longitudinal plan of care) Comprehensive medication review performed; medication list updated in electronic medical record  Diabetes: Uncontrolled; current treatment: rybelsus '7mg'$ , farxiga '10mg'$  , glipizide;  A1C 7.2% , GFR 44 Denies personal and family history of Medullary thyroid cancer (MTC) Patient reports blood sugars have improved 150-200s. Counseled on diet.  He is taking rybelsus as prescribed.  Will continue '7mg'$ .  Patient has supply at home and back up RX was sent to the drug store Consider increasing rybelsus to '14mg'$  vs adding another therapy at follow up Current glucose readings: fasting glucose: 150-200;  post prandial glucose: n/a Denies hypoglycemic/hyperglycemic symptoms Discussed meal planning options and Plate method for healthy eating Avoid sugary drinks and desserts Incorporate balanced protein, non starchy veggies, 1 serving of carbohydrate with each meal Increase water intake Increase physical activity as able Current exercise: N/A Educated on MEDICATIONS (PURPOSE AND SIDE EFFECTS); INCREASED DOSE OF RYBELSUS  Recommended continue current therapy  Patient Goals/Self-Care Activities Over the next 90 days, patient will:  - take medications as prescribed check glucose DAILY (FASTING) OR IF SYMPTOMATIC, document, and  provide at future appointments  Follow Up Plan: Telephone follow up appointment with care management team member scheduled for: 1 month         The patient verbalized understanding of instructions, educational materials, and care plan provided today and declined offer to receive copy of patient instructions, educational materials, and care plan.   Telephone follow up appointment with care management team member scheduled for: 2 months  Signature Regina Eck, PharmD, BCPS Clinical Pharmacist, [place]  II Phone [phone]

## 2020-12-12 NOTE — Progress Notes (Signed)
Chronic Care Management Pharmacy Note  12/12/2020 Name:  Matthew Campos MRN:  [id number] DOB:  [birth year]  Summary: t2dm  Recommendations/Changes made from today's visit: Diabetes: Uncontrolled; current treatment: rybelsus 7mg , farxiga 10mg  , glipizide;  A1C 7.2% , GFR 44 Denies personal and family history of Medullary thyroid cancer (MTC) Patient reports blood sugars have improved 150-200s. Counseled on diet.  He is taking rybelsus as prescribed.  Will continue 7mg .  Patient has supply at home and back up RX was sent to the drug store Consider increasing rybelsus to 14mg  vs adding another therapy at follow up Current glucose readings: fasting glucose: 150-200;  post prandial glucose: n/a Denies hypoglycemic/hyperglycemic symptoms Discussed meal planning options and Plate method for healthy eating Avoid sugary drinks and desserts Incorporate balanced protein, non starchy veggies, 1 serving of carbohydrate with each meal Increase water intake Increase physical activity as able Current exercise: N/A Educated on MEDICATIONS (PURPOSE AND SIDE EFFECTS); INCREASED DOSE OF RYBELSUS  Recommended continue current therapy  Follow Up Plan: Telephone follow up appointment with care management team member scheduled for: 1 month  Subjective: Matthew Campos is an 80 y.o. year old male who is a primary patient of [name], DO.  The CCM team was consulted for assistance with disease management and care coordination needs.    Engaged with patient by telephone for follow up visit in response to provider referral for pharmacy case management and/or care coordination services.   Consent to Services:  The patient was given information about Chronic Care Management services, agreed to services, and gave verbal consent prior to initiation of services.  Please see initial visit note for detailed documentation.   Patient Care Team: [name], DO as PCP - General (Family  Medicine) Curly Rim, MD as Referring Physician (Internal Medicine) [name], DO (Family Medicine) Katha Cabal, LCSW as Social Worker (Licensed Clinical Social Worker) Lottie Dawson D, [facility] (Pharmacist)  Objective:  Lab Results  Component Value Date   CREATININE 1.59 (H) 10/13/2020   CREATININE 1.67 (H) 05/30/2020   CREATININE 1.71 (H) 02/04/2020    Lab Results  Component Value Date   HGBA1C 7.2 (H) 10/13/2020   Last diabetic Eye exam:  Lab Results  Component Value Date/Time   HMDIABEYEEXA No Retinopathy 02/15/2020 12:00 AM    Last diabetic Foot exam: No results found for: HMDIABFOOTEX      Component Value Date/Time   CHOL 146 10/13/2020 1055   TRIG 340 (H) 10/13/2020 1055   HDL 35 (L) 10/13/2020 1055   CHOLHDL 4.2 10/13/2020 1055   CHOLHDL 3.3 04/13/2019 1722   VLDL 18 04/13/2019 1722   LDLCALC 58 10/13/2020 1055    Hepatic Function Latest Ref Rng & Units 10/13/2020 05/30/2020 02/04/2020  Total Protein 6.0 - 8.5 g/dL 7.3 - 7.6  Albumin 3.7 - 4.7 g/dL 4.5 4.7 4.4  AST 0 - 40 IU/L 16 - 15  ALT 0 - 44 IU/L 16 - 12  Alk Phosphatase 44 - 121 IU/L 68 - 103  Total Bilirubin 0.0 - 1.2 mg/dL 0.5 - 0.5    Lab Results  Component Value Date/Time   TSH 1.630 10/13/2020 10:55 AM   TSH 1.860 05/30/2020 10:05 AM   FREET4 1.52 10/13/2020 10:55 AM   FREET4 0.53 (L) 04/14/2019 01:11 AM    CBC Latest Ref Rng & Units 10/13/2020 05/30/2020 02/04/2020  WBC 3.4 - 10.8 x10E3/uL 8.6 9.3 8.5  Hemoglobin 13.0 - 17.7 g/dL 15.4 14.8 13.3  Hematocrit 37.5 - 51.0 % 46.8 46.9 41.0  Platelets 150 - 450 x10E3/uL 146(L) 155 172    No results found for: VD25OH  Clinical ASCVD: Yes  The ASCVD Risk score Mikey Bussing DC Jr., et al., 2013) failed to calculate for the following reasons:   The 2013 ASCVD risk score is only valid for ages 57 to 83   The patient has a prior MI or stroke diagnosis    Other: (CHADS2VASc if Afib, PHQ9 if depression, MMRC or CAT for COPD, ACT,  DEXA)  Social History   Tobacco Use  Smoking Status Former  Smokeless Tobacco Never  Tobacco Comments   quit smoking 20+yrs ago   BP Readings from Last 3 Encounters:  10/13/20 122/69  06/27/20 112/71  05/30/20 122/72   Pulse Readings from Last 3 Encounters:  10/13/20 75  06/27/20 70  05/30/20 76   Wt Readings from Last 3 Encounters:  10/13/20 192 lb 6.4 oz (87.3 kg)  07/26/20 196 lb (88.9 kg)  06/27/20 196 lb (88.9 kg)    Assessment: Review of patient past medical history, allergies, medications, health status, including review of consultants reports, laboratory and other test data, was performed as part of comprehensive evaluation and provision of chronic care management services.   SDOH:  (Social Determinants of Health) assessments and interventions performed:    CCM Care Plan  Allergies  Allergen Reactions   Codeine Nausea And Vomiting   Codeine    Latex Hives and Itching   Lisinopril    Lisinopril Cough    Medications Reviewed Today     Reviewed by Chevis Pretty, FNP (Family Nurse Practitioner) on 12/04/20 at [place] List Status: <None>   Medication Order Taking? Sig Documenting Provider Last Dose Status Informant  acetaZOLAMIDE (DIAMOX) 250 MG tablet [id number] No Take by mouth. [provider] Taking Expired 06/13/19 2359   albuterol (PROVENTIL) (2.5 MG/3ML) 0.083% nebulizer solution [id number] No Take 2.5 mg by nebulization every 6 (six) hours as needed for wheezing or shortness of breath. [provider] Taking Active Spouse/Significant Other  albuterol (VENTOLIN HFA) 108 (90 Base) MCG/ACT inhaler [id number]  Inhale 2 puffs into the lungs every 6 (six) hours as needed for wheezing or shortness of breath. Ronnie Doss M, DO  Active   aspirin EC 81 MG tablet [id number] No Take 81 mg by mouth daily. [provider] Taking Active Spouse/Significant Other           Med Note Bradly Chris, Gloris [place]   Tue Sep 07, 2019  9:28 AM)     benzonatate (TESSALON PERLES) 100 MG capsule [id number]  Take 1 capsule (100 mg total) by mouth 3 (three) times daily as needed for cough. Ivy Lynn, NP  Active   calcium carbonate (OSCAL) 1500 (600 Ca) MG TABS tablet [id number] No Take by mouth 2 (two) times daily with a meal. [provider] Taking Active Spouse/Significant Other  clopidogrel (PLAVIX) 75 MG tablet [id number] No TAKE ONE (1) TABLET EACH DAY Gottschalk, [place] M, DO Taking Active   dextromethorphan-guaiFENesin (MUCINEX DM) 30-600 MG 12hr tablet [id number]  Take 1 tablet by mouth 2 (two) times daily. Ivy Lynn, NP  Active   doxycycline (VIBRA-TABS) 100 MG tablet [id number] Yes Take 1 tablet (100 mg total) by mouth 2 (two) times daily. 1 po bid Chevis Pretty, FNP  Active   FARXIGA 10 MG TABS tablet [id number] No TAKE ONE TABLET [facility] BEFORE BREAKFAST Ronnie Doss M, [place] Taking Active   ferrous sulfate  325 (65 FE) MG EC tablet [id number] No Take 325 mg by mouth every Monday, Wednesday, and Friday.  [provider] Taking Active Spouse/Significant Other  glipiZIDE (GLUCOTROL) 5 MG tablet [id number] No TAKE 1 TABLET TWICE A DAY BEFORE A MEAL Gottschalk, Ashly M, DO Taking Active   levothyroxine (SYNTHROID) 100 MCG tablet [id number] No TAKE ONE (1) TABLET EACH DAY Gottschalk, [place], DO Taking Active   lubiprostone (AMITIZA) 24 MCG capsule [id number]  TAKE 1 CAPSULE TWICE A DAY WITH MEALS. Ronnie Doss M, DO  Active   mirtazapine (REMERON) 7.5 MG tablet [id number]  Take 1 tablet (7.5 mg total) by mouth at bedtime. STOP Jerelyn Scott M, DO  Active   Multiple Vitamin (MULTIVITAMIN WITH MINERALS) TABS [id number] No Take 1 tablet by mouth daily. [provider] Taking Active Spouse/Significant Other  nitroGLYCERIN (NITROSTAT) 0.4 MG SL tablet [id number] No Place 0.4 mg under the tongue every 5 (five) minutes as needed. For chest pain [provider] Taking Active  Spouse/Significant Other  ondansetron (ZOFRAN-ODT) 4 MG disintegrating tablet [id number] No TAKE 1 TABLET EVERY 8 HOURS AS NEEDED FOR NAUSEA AND VOMITING Ronnie Doss M, DO Taking Active   pantoprazole (PROTONIX) 40 MG tablet [id number]  TAKE ONE (1) TABLET EACH DAY Gottschalk, Ashly M, DO  Active   rosuvastatin (CRESTOR) 10 MG tablet [id number]  TAKE ONE (1) TABLET EACH DAY Gottschalk, Ashly M, DO  Active   Semaglutide (RYBELSUS) 7 MG TABS [id number]  Take 7 mg by mouth daily. Ronnie Doss M, DO  Active   sodium chloride (OCEAN) 0.65 % nasal spray [id number] No Place into the nose.  Patient not taking: No sig reported   [provider] Not Taking Active   spironolactone (ALDACTONE) 25 MG tablet [id number] No Take 25 mg by mouth daily.  [provider] 09/06/2019 Unknown time Expired 09/07/19 2359 Spouse/Significant Other  SYMBICORT 80-4.5 MCG/ACT inhaler [id number] No Inhale 2 puffs into the lungs 2 (two) times daily as needed. [provider] Taking Active Spouse/Significant Other  torsemide (DEMADEX) 20 MG tablet [id number]  TAKE TWO TABLETS BY MOUTH TWICE DAILY [name], DO  Active   traZODone (DESYREL) 50 MG tablet [id number] No TAKE 1/2 TO 1 TABLET AT BEDTIME AS NEEDED FOR SLEEP [name], DO Taking Active             Patient Active Problem List   Diagnosis Date Noted   Viral upper respiratory tract infection 10/25/2020   Hyperlipidemia associated with type 2 diabetes mellitus ([place]) 02/04/2020   CAP (community acquired pneumonia) 09/07/2019   Hypomagnesemia 09/07/2019   CKD (chronic kidney disease), stage III ([place]) 09/07/2019   Volume overload 09/07/2019   Community acquired pneumonia 09/07/2019   GI bleeding 04/14/2019   Acute on chronic diastolic heart failure (HCC)    AKI (acute kidney injury) ([place])    Anemia due to blood loss    Bradycardia    Renal insufficiency    UGI bleed 04/13/2019   Chest pain 04/13/2019   History of  recent transcatheter aortic valve replacement (TAVR) 04/13/2019   Cholecystostomy care ([place]) 04/13/2019   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   COPD (chronic obstructive pulmonary disease) (HCC)    Coronary artery disease    Chronic ischemic heart disease    Paroxysmal atrial fibrillation (HCC)    Cholecystitis    OSA (obstructive sleep apnea)    Coagulopathy (HCC)    Chronic anticoagulation    Dysphagia    Elevated alkaline  phosphatase measurement    Elevated bilirubin    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   Sinus bradycardia 02/18/2019   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   Normocytic anemia 12/23/2018   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[place]) 12/23/2018   OSA (obstructive sleep apnea) 12/23/2018   Severe aortic stenosis 11/26/2018   Anxiety 10/28/2018   CHF (congestive heart failure) ([place]) 10/28/2018   At risk for falls 10/28/2018   Oxygen dependent 10/28/2018   Major depressive disorder, single episode, severe without psychotic features ([place]) 07/21/2017   Thrombocytopenia ([place]) 07/21/2017   COPD (chronic obstructive pulmonary disease) ([place]) 12/27/2015   CAD (coronary artery disease) 12/27/2015   Gastroesophageal reflux disease 12/27/2015   Hypertension associated with diabetes ([place]) 12/27/2015   Hyperlipidemia 09/09/2013   Type 2 diabetes mellitus ([place]) 09/09/2013    Immunization History  Administered Date(s) Administered   Fluad Quad(high Dose 65+) 02/04/2020   Influenza, High Dose Seasonal PF 02/29/2016, 01/20/2017, 02/10/2018   Influenza, Seasonal, Injecte, Preservative Fre 02/03/2014   Influenza,inj,Quad PF,6+ Mos 01/01/2019   Influenza-Unspecified 02/03/2014, 02/29/2016, 01/20/2017, 02/10/2018   Moderna Sars-Covid-2 Vaccination 07/05/2019, 08/02/2019, 02/23/2020   Pneumococcal Conjugate-13 11/25/2014, 11/25/2014   Pneumococcal Polysaccharide-23 05/30/2016, 05/30/2016, 01/01/2019   Tdap 11/25/2014, 11/25/2014     Conditions to be addressed/monitored: DMII  Care Plan : PHARMD MEDICATION MANAGEMENT  Updates made by Lavera Guise, [place] since 12/12/2020 12:00 AM     Problem: DISEASE PROGRESSION PREVENTION      Long-Range Goal: T2DM   Recent Progress: Not on track  Priority: High  Note:   Current Barriers:  Unable to achieve control of T2DM  Suboptimal therapeutic regimen for T2DM  Pharmacist Clinical Goal(s):  Over the next 90 days, patient will achieve control of T2DM as evidenced by IMPROVED GLYCEMIC CONTROL  through collaboration with PharmD and provider.   Interventions: 1:1 collaboration with [name], DO regarding development and update of comprehensive plan of care as evidenced by provider attestation and co-signature Inter-disciplinary care team collaboration (see longitudinal plan of care) Comprehensive medication review performed; medication list updated in electronic medical record  Diabetes: Uncontrolled; current treatment: rybelsus 7mg , farxiga 10mg  , glipizide;  A1C 7.2% , GFR 44 Denies personal and family history of Medullary thyroid cancer (MTC) Patient reports blood sugars have improved 150-200s. Counseled on diet.  He is taking rybelsus as prescribed.  Will continue 7mg .  Patient has supply at home and back up RX was sent to the drug store Consider increasing rybelsus to 14mg  vs adding another therapy at follow up Current glucose readings: fasting glucose: 150-200;  post prandial glucose: n/a Denies hypoglycemic/hyperglycemic symptoms Discussed meal planning options and Plate method for healthy eating Avoid sugary drinks and desserts Incorporate balanced protein, non starchy veggies, 1 serving of carbohydrate with each meal Increase water intake Increase physical activity as able Current exercise: N/A Educated on MEDICATIONS (PURPOSE AND SIDE EFFECTS); INCREASED DOSE OF RYBELSUS  Recommended continue current therapy  Patient Goals/Self-Care  Activities Over the next 90 days, patient will:  - take medications as prescribed check glucose DAILY (FASTING) OR IF SYMPTOMATIC, document, and provide at future appointments  Follow Up Plan: Telephone follow up appointment with care management team member scheduled for: 1 month      Medication Assistance: None required.  Patient affirms current coverage meets needs.  Patient's preferred pharmacy is:  [facility] - [address], [address] [place] [address] Phone: [phone] Fax: [phone]  Follow Up:  Patient agrees to Care Plan and Follow-up.  Plan: Telephone follow up appointment with care management team member scheduled for:  2 months  Regina Eck, PharmD, BCPS Clinical Pharmacist, [place]  [facility] Phone [phone]

## 2020-12-13 DIAGNOSIS — E1169 Type 2 diabetes mellitus with other specified complication: Secondary | ICD-10-CM | POA: Diagnosis not present

## 2020-12-16 IMAGING — US US ABDOMEN LIMITED
1 series · 14 of 25 positions shown · non-contrast
Comparison: None.

CLINICAL DATA: Cellulitis at cholecystostomy site

EXAM:
ULTRASOUND ABDOMEN LIMITED RIGHT UPPER QUADRANT

[Series 1: us abdomen limited · 0.22mm/px · 14 of 64 slices shown]
[im 1/64]
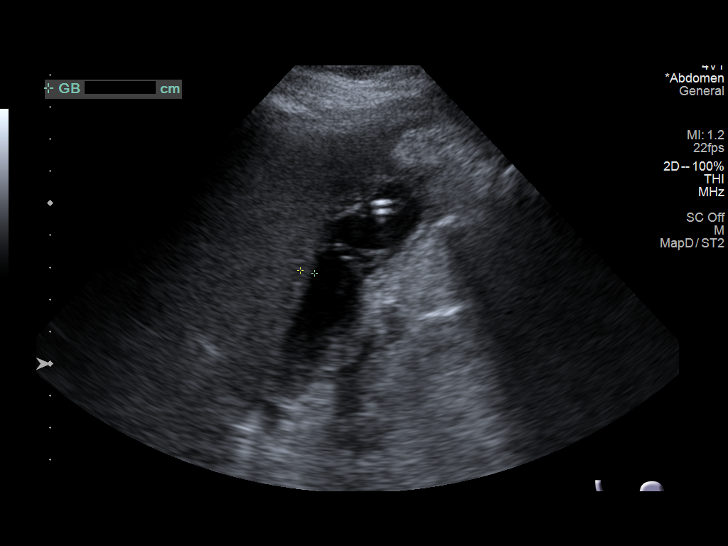
[im 6/64]
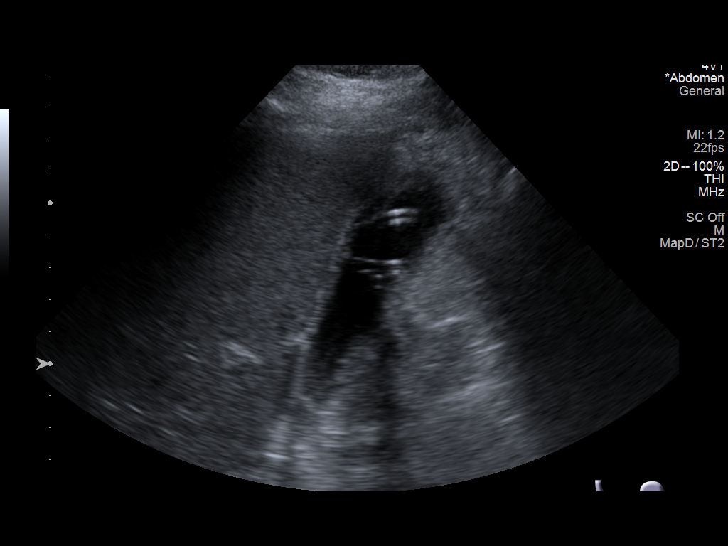
[im 11/64]
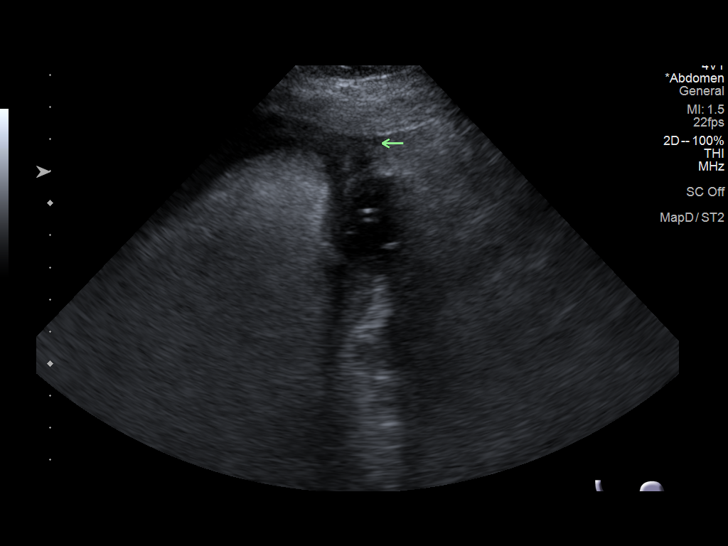
[im 16/64]
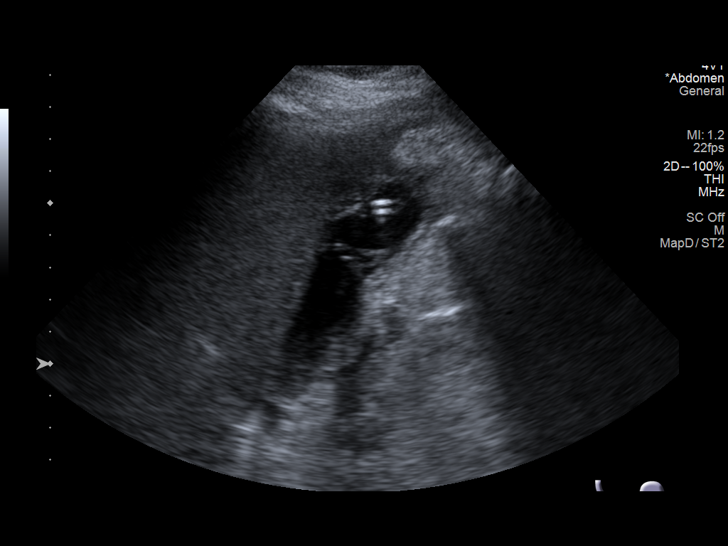
[im 22/64]
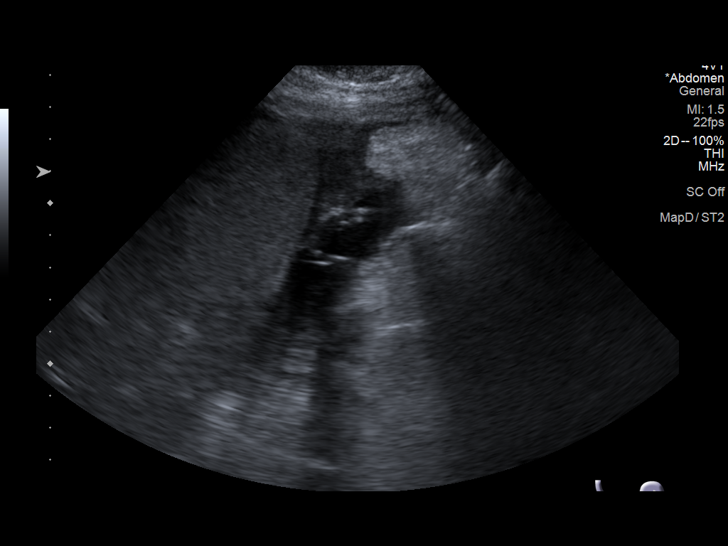
[im 24/64]
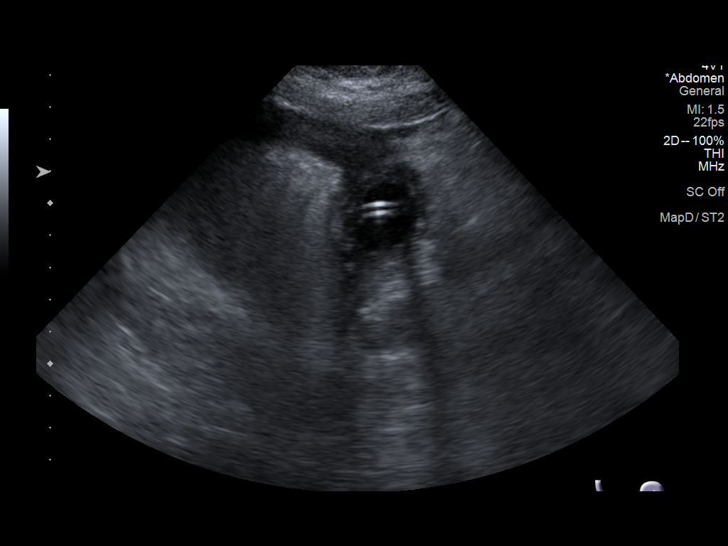
[im 29/64]
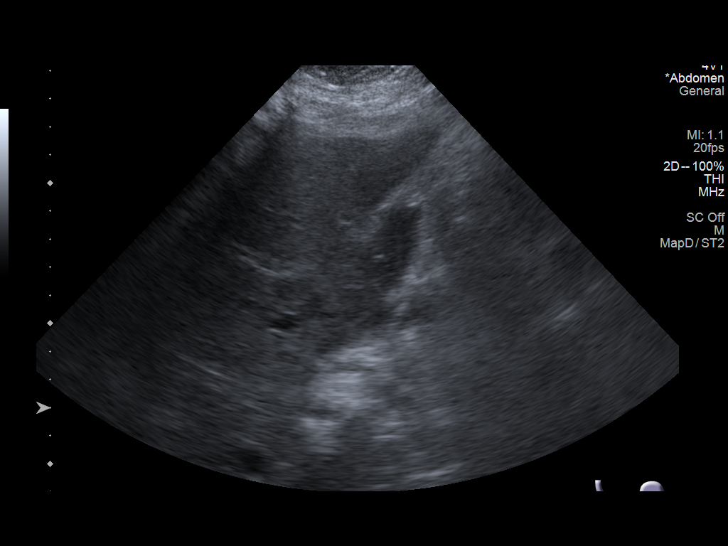
[im 35/64]
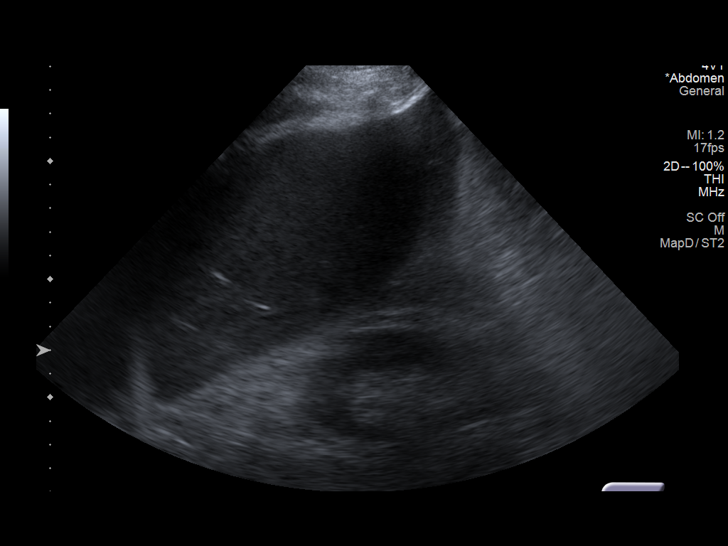
[im 40/64]
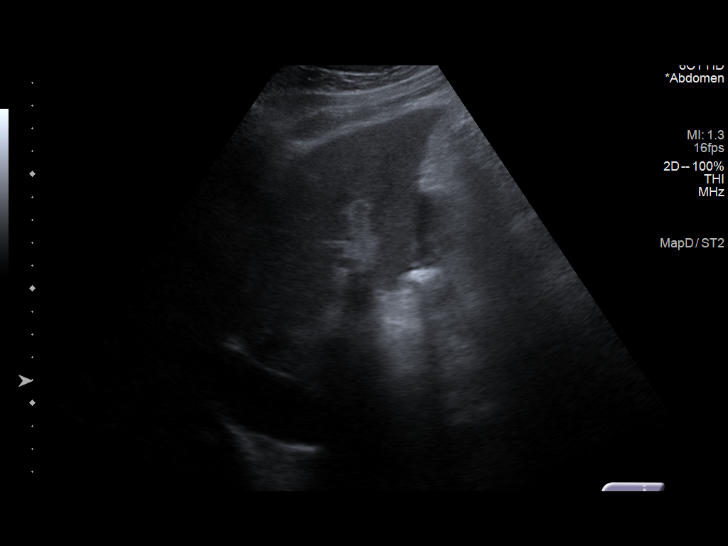
[im 43/64]
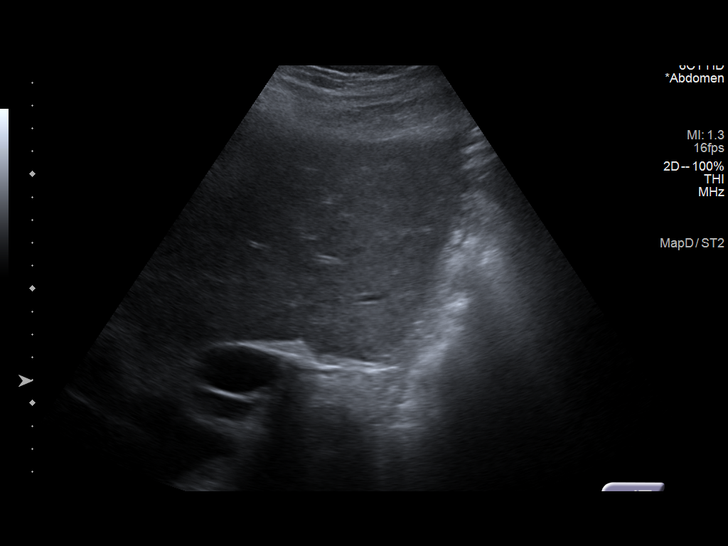
[im 48/64]
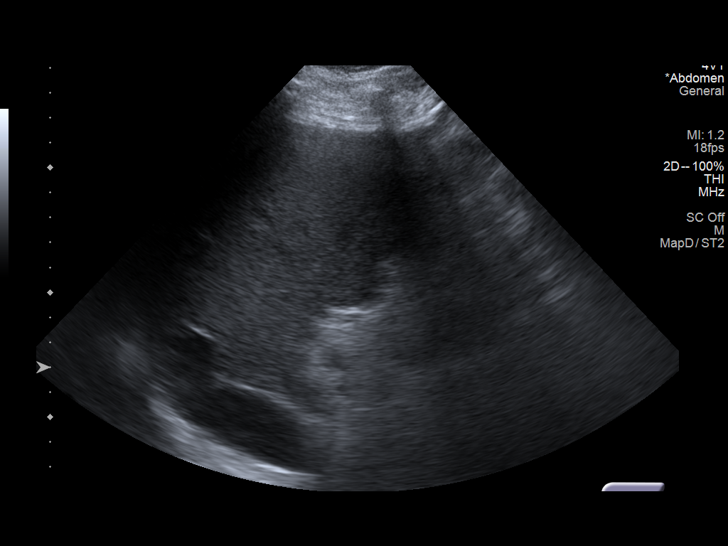
[im 53/64]
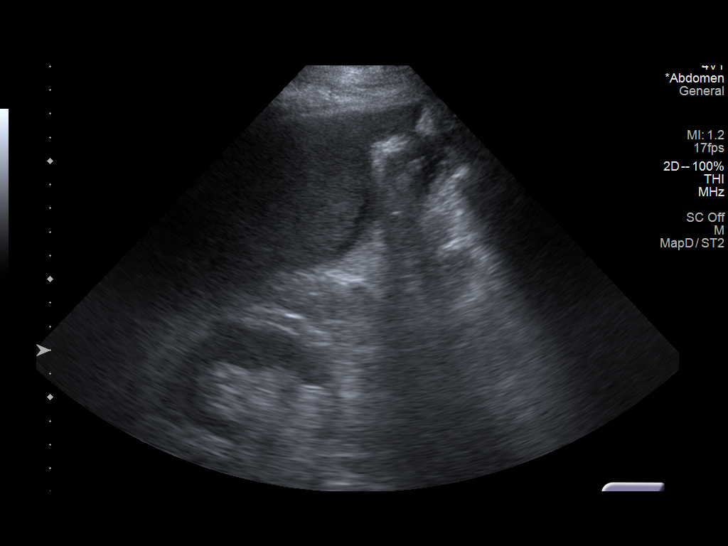
[im 58/64]
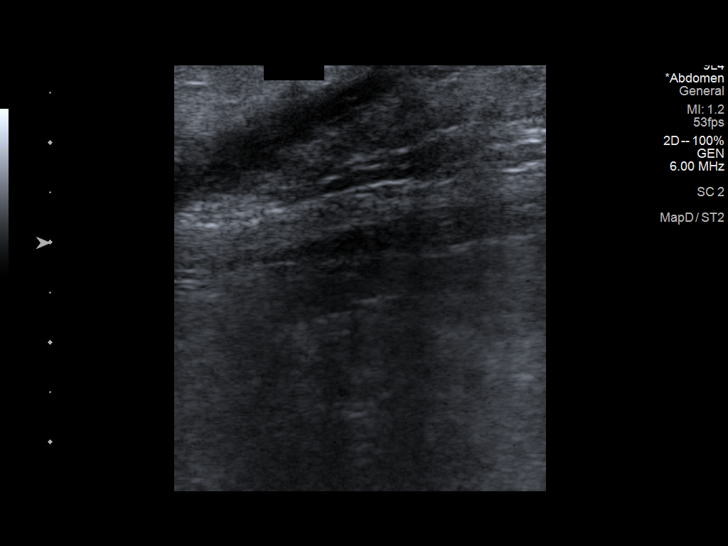
[im 64/64]
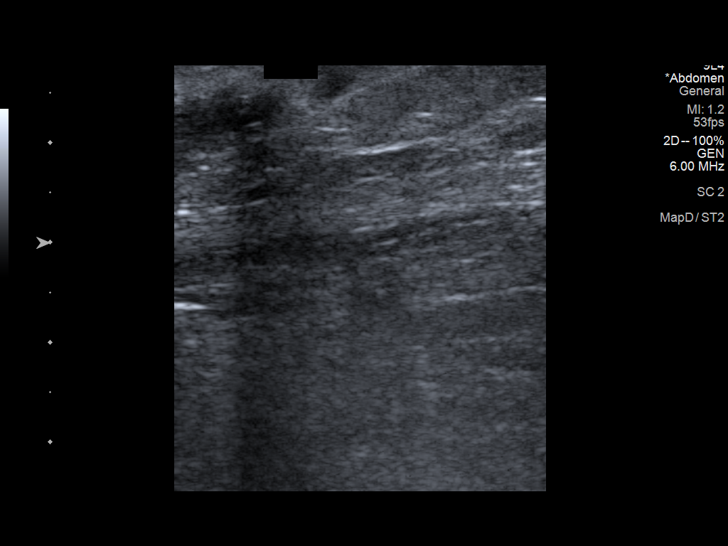

[14 of 25 positions shown; findings below may reference images not displayed]

FINDINGS: Gallbladder:

Catheter is present within the gallbladder. Mild gallbladder wall
thickening measuring 5 mm. No sonographic Murphy sign reported.

Common bile duct:

Diameter: 4 mm

Liver:

No focal lesion identified. Parenchymal echogenicity is within
normal limits. Portal vein is patent on color Doppler imaging with
normal direction of blood flow towards the liver.

Other: There is right pleural and right perihepatic fluid. There is
no subcutaneous fluid collection about the drainage catheter.
IMPRESSION: No fluid collection about the cholecystostomy catheter.

Right pleural and perihepatic fluid.

## 2020-12-30 ENCOUNTER — Other Ambulatory Visit: Payer: Self-pay | Admitting: Family Medicine

## 2020-12-30 DIAGNOSIS — N1831 Chronic kidney disease, stage 3a: Secondary | ICD-10-CM

## 2021-01-01 ENCOUNTER — Other Ambulatory Visit: Payer: Self-pay | Admitting: Family Medicine

## 2021-01-01 DIAGNOSIS — J449 Chronic obstructive pulmonary disease, unspecified: Secondary | ICD-10-CM | POA: Diagnosis not present

## 2021-01-01 DIAGNOSIS — R112 Nausea with vomiting, unspecified: Secondary | ICD-10-CM

## 2021-01-15 ENCOUNTER — Ambulatory Visit (INDEPENDENT_AMBULATORY_CARE_PROVIDER_SITE_OTHER): Payer: Medicare Other | Admitting: Family Medicine

## 2021-01-15 ENCOUNTER — Encounter: Payer: Self-pay | Admitting: Family Medicine

## 2021-01-15 ENCOUNTER — Other Ambulatory Visit: Payer: Self-pay

## 2021-01-15 VITALS — BP 113/67 | HR 95 | Temp 97.4°F | Ht 68.0 in | Wt 199.0 lb

## 2021-01-15 DIAGNOSIS — F331 Major depressive disorder, recurrent, moderate: Secondary | ICD-10-CM

## 2021-01-15 DIAGNOSIS — Z7409 Other reduced mobility: Secondary | ICD-10-CM | POA: Diagnosis not present

## 2021-01-15 DIAGNOSIS — E1122 Type 2 diabetes mellitus with diabetic chronic kidney disease: Secondary | ICD-10-CM | POA: Diagnosis not present

## 2021-01-15 DIAGNOSIS — I152 Hypertension secondary to endocrine disorders: Secondary | ICD-10-CM | POA: Diagnosis not present

## 2021-01-15 DIAGNOSIS — F99 Mental disorder, not otherwise specified: Secondary | ICD-10-CM

## 2021-01-15 DIAGNOSIS — E1159 Type 2 diabetes mellitus with other circulatory complications: Secondary | ICD-10-CM | POA: Diagnosis not present

## 2021-01-15 DIAGNOSIS — L729 Follicular cyst of the skin and subcutaneous tissue, unspecified: Secondary | ICD-10-CM

## 2021-01-15 DIAGNOSIS — E785 Hyperlipidemia, unspecified: Secondary | ICD-10-CM

## 2021-01-15 DIAGNOSIS — E1169 Type 2 diabetes mellitus with other specified complication: Secondary | ICD-10-CM | POA: Diagnosis not present

## 2021-01-15 DIAGNOSIS — Z23 Encounter for immunization: Secondary | ICD-10-CM | POA: Diagnosis not present

## 2021-01-15 DIAGNOSIS — L089 Local infection of the skin and subcutaneous tissue, unspecified: Secondary | ICD-10-CM | POA: Diagnosis not present

## 2021-01-15 DIAGNOSIS — N1831 Chronic kidney disease, stage 3a: Secondary | ICD-10-CM

## 2021-01-15 DIAGNOSIS — F411 Generalized anxiety disorder: Secondary | ICD-10-CM

## 2021-01-15 LAB — BAYER DCA HB A1C WAIVED: HB A1C (BAYER DCA - WAIVED): 6.7 % — ABNORMAL HIGH (ref 4.8–5.6)

## 2021-01-15 MED ORDER — DOXYCYCLINE HYCLATE 100 MG PO TABS: 100.0000 mg | ORAL_TABLET | Freq: Two times a day (BID) | ORAL | 0 refills | Status: AC

## 2021-01-15 MED ORDER — RYBELSUS 7 MG PO TABS: 7.0000 mg | ORAL_TABLET | Freq: Every day | ORAL | 12 refills | Status: DC

## 2021-01-15 MED ORDER — DAPAGLIFLOZIN PROPANEDIOL 10 MG PO TABS: ORAL_TABLET | ORAL | 12 refills | Status: DC

## 2021-01-15 MED ORDER — MIRTAZAPINE 7.5 MG PO TABS: 7.5000 mg | ORAL_TABLET | Freq: Every day | ORAL | 3 refills | Status: DC

## 2021-01-15 MED ORDER — GLIPIZIDE 5 MG PO TABS: 5.0000 mg | ORAL_TABLET | Freq: Every day | ORAL | 3 refills | Status: DC

## 2021-01-15 NOTE — Patient Instructions (Addendum)
Sugar looks PERFECT.  I am reducing your glipizide to just 1 per morning.  Let's see how things go.  We might be able to get rid of it.  Doxycycline for your infection.  Referral to Dr Constance Haw is in place.  You had labs performed today.  You will be contacted with the results of the labs once they are available, usually in the next 3 business days for routine lab work.  If you have an active my chart account, they will be released to your MyChart.  If you prefer to have these labs released to you via telephone, please let us know.

## 2021-01-15 NOTE — Progress Notes (Addendum)
Subjective: CC:dm PCP: [name], DO XHB:ZJIR Matthew Campos is a 80 y.o. male presenting to clinic today for:  1. Type 2 Diabetes with hypertension, hyperlipidemia:  Started on Rybelsus 7 mg at last visit and he is doing well.  His wife reports that his blood sugars often run around 160s.  He is compliant with Farxiga 10 mg daily and glipizide 5 mg twice daily.  No hypoglycemic episodes reported.  Compliant with heart medications, cholesterol medicine.  Last eye exam: UTD Last foot exam: needs Last A1c:  Lab Results  Component Value Date   HGBA1C 7.2 (H) 10/13/2020   Nephropathy screen indicated?: needs Last flu, zoster and/or pneumovax:  Immunization History  Administered Date(s) Administered   Fluad Quad(high Dose 65+) 02/04/2020, 01/15/2021   Influenza, High Dose Seasonal PF 02/29/2016, 01/20/2017, 02/10/2018   Influenza, Seasonal, Injecte, Preservative Fre 02/03/2014   Influenza,inj,Quad PF,6+ Mos 01/01/2019   Influenza-Unspecified 02/03/2014, 02/29/2016, 01/20/2017, 02/10/2018   Moderna Sars-Covid-2 Vaccination 07/05/2019, 08/02/2019, 02/23/2020   PNEUMOCOCCAL CONJUGATE-20 01/15/2021   Pneumococcal Conjugate-13 11/25/2014, 11/25/2014   Pneumococcal Polysaccharide-23 05/30/2016, 05/30/2016, 01/01/2019   Tdap 11/25/2014, 11/25/2014    ROS: No chest pain, shortness of breath, edema.  He has a cardiac appointment tomorrow.  No reports of bleeding.  2.  Memory problems Patient has ongoing memory issues.  He reports that his short-term memory is primarily affected.  Sometimes he will be driving and forget where he is going.  His wife confirms these issues.  3.  Infection His wife reports that the lesion on his back appears to be infected as its been draining purulent material for the last 2 days.  No fevers reported.   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    Anemia    Anxiety and depression    Aortic stenosis    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HCC)    CHF (congestive heart failure) (HCC)    Cholecystitis    Chronic ischemic heart disease    Complication of anesthesia    hard to be put to sleep   COPD (chronic obstructive pulmonary disease) (HCC)    Coronary artery disease    Diabetes mellitus without complication (HCC)    Diabetes mellitus without complication ([place])    takes Januvia and Metformin daily   GERD (gastroesophageal reflux disease)    GERD (gastroesophageal reflux disease)    takes omeprazole daily   Headache(784.0)    sinus   History of bronchitis    last time >24yrs ago   Hyperlipidemia    takes Lipitor daily   Hypertension    Hypertension    takes Metoprolol daily   Joint pain    Myocardial infarction ([place])    x 3;last one about 3-70yrs ago   Neoplasm of uncertain behavior of bladder    Obesity    OSA (obstructive sleep apnea)    Pacemaker    Pancytopenia (HCC)    Paroxysmal atrial fibrillation (HCC)    Pneumonia    last itme about 76yrs ago   S/P TAVR (transcatheter aortic valve replacement)    Severe aortic stenosis    Sinus bradycardia     Current Outpatient Medications:    albuterol (PROVENTIL) (  2.5 MG/3ML) 0.083% nebulizer solution, Take 2.5 mg by nebulization every 6 (six) hours as needed for wheezing or shortness of breath., Disp: , Rfl:    albuterol (VENTOLIN HFA) 108 (90 Base) MCG/ACT inhaler, Inhale 2 puffs into the lungs every 6 (six) hours as needed for wheezing or shortness of breath., Disp: 18 g, Rfl: 3   aspirin EC 81 MG tablet, Take 81 mg by mouth daily., Disp: , Rfl:    benzonatate (TESSALON PERLES) 100 MG capsule, Take 1 capsule (100 mg total) by mouth 3 (three) times daily as needed for cough., Disp: 20 capsule, Rfl: 0   calcium carbonate  (OSCAL) 1500 (600 Ca) MG TABS tablet, Take by mouth 2 (two) times daily with a meal., Disp: , Rfl:    clopidogrel (PLAVIX) 75 MG tablet, TAKE ONE (1) TABLET EACH DAY, Disp: 90 tablet, Rfl: 0   dextromethorphan-guaiFENesin (MUCINEX DM) 30-600 MG 12hr tablet, Take 1 tablet by mouth 2 (two) times daily., Disp: 30 tablet, Rfl: 0   doxycycline (VIBRA-TABS) 100 MG tablet, Take 1 tablet (100 mg total) by mouth 2 (two) times daily. 1 po bid, Disp: 28 tablet, Rfl: 0   FARXIGA 10 MG TABS tablet, TAKE ONE TABLET EACH MORNING BEFORE BREAKFAST, Disp: 30 tablet, Rfl: 0   ferrous sulfate 325 (65 FE) MG EC tablet, Take 325 mg by mouth every Monday, Wednesday, and Friday. , Disp: , Rfl:    glipiZIDE (GLUCOTROL) 5 MG tablet, TAKE 1 TABLET TWICE A DAY BEFORE A MEAL, Disp: 60 tablet, Rfl: 0   levothyroxine (SYNTHROID) 100 MCG tablet, TAKE ONE (1) TABLET EACH DAY, Disp: 90 tablet, Rfl: 3   lubiprostone (AMITIZA) 24 MCG capsule, TAKE 1 CAPSULE TWICE A DAY WITH MEALS., Disp: 60 capsule, Rfl: 2   mirtazapine (REMERON) 7.5 MG tablet, Take 1 tablet (7.5 mg total) by mouth at bedtime. STOP LEXAPRO, Disp: 90 tablet, Rfl: 0   Multiple Vitamin (MULTIVITAMIN WITH MINERALS) TABS, Take 1 tablet by mouth daily., Disp: , Rfl:    nitroGLYCERIN (NITROSTAT) 0.4 MG SL tablet, Place 1 tablet (0.4 mg total) under the tongue every 5 (five) minutes as needed. For chest pain, Disp: 25 tablet, Rfl: 2   ondansetron (ZOFRAN-ODT) 4 MG disintegrating tablet, TAKE 1 TABLET EVERY 8 HOURS AS NEEDED FOR NAUSEA AND VOMITING, Disp: 20 tablet, Rfl: 0   pantoprazole (PROTONIX) 40 MG tablet, TAKE ONE (1) TABLET EACH DAY, Disp: 90 tablet, Rfl: 0   rosuvastatin (CRESTOR) 10 MG tablet, TAKE ONE (1) TABLET EACH DAY, Disp: 90 tablet, Rfl: 3   Semaglutide (RYBELSUS) 7 MG TABS, Take 7 mg by mouth daily., Disp: 30 tablet, Rfl: 2   sodium chloride (OCEAN) 0.65 % nasal spray, Place into the nose., Disp: , Rfl:    SYMBICORT 80-4.5 MCG/ACT inhaler, Inhale 2 puffs into  the lungs 2 (two) times daily as needed., Disp: , Rfl:    torsemide (DEMADEX) 20 MG tablet, TAKE TWO TABLETS BY MOUTH TWICE DAILY, Disp: 120 tablet, Rfl: 2   traZODone (DESYREL) 50 MG tablet, TAKE 1/2 TO 1 TABLET AT BEDTIME AS NEEDED FOR SLEEP, Disp: 90 tablet, Rfl: 1   acetaZOLAMIDE (DIAMOX) 250 MG tablet, Take by mouth., Disp: , Rfl:    spironolactone (ALDACTONE) 25 MG tablet, Take 25 mg by mouth daily. , Disp: , Rfl:  Social History   Socioeconomic History   Marital status: Married    Spouse name: Not on file   Number of children: Not on file   Years of education:  Not on file   Highest education level: Not on file  Occupational History   Occupation: retired  Tobacco Use   Smoking status: Former   Smokeless tobacco: Never   Tobacco comments:    quit smoking 20+yrs ago  Vaping Use   Vaping Use: Never used  Substance and Sexual Activity   Alcohol use: No    Comment: last drank about 2-3 years ago   Drug use: No   Sexual activity: Yes  Other Topics Concern   Not on file  Social History Narrative   Lives with his wife - their grandson lives with them at times       Social Determinants of Health   Financial Resource Strain: Low Risk    Difficulty of Paying Living Expenses: Not very hard  Food Insecurity: No Food Insecurity   Worried About Charity fundraiser in the Last Year: Never true   [place] in the Last Year: Never true  Transportation Needs: No Transportation Needs   Lack of Transportation (Medical): No   Lack of Transportation (Non-Medical): No  Physical Activity: Insufficiently Active   Days of Exercise per Week: 7 days   Minutes of Exercise per Session: 20 min  Stress: Stress Concern Present   Feeling of Stress : To some extent  Social Connections: Moderately Isolated   Frequency of Communication with Friends and Family: Twice a week   Frequency of Social Gatherings with Friends and Family: Twice a week   Attends Religious Services: Never   Building surveyor or Organizations: No   Attends Music therapist: Never   Marital Status: Married  Human resources officer Violence: Not At Risk   Fear of Current or Ex-Partner: No   Emotionally Abused: No   Physically Abused: No   Sexually Abused: No   Family History  Problem Relation Age of Onset   Cancer Mother        Unknown type   Heart disease Father    Diabetes Father    Heart disease Sister    Seizures Brother    Diabetes Daughter    Diabetes Daughter    Heart attack Son    Colon cancer Mother        in her 28s.    Stomach cancer Neg Hx    Esophageal cancer Neg Hx     Objective: Office vital signs reviewed. BP 113/67   Pulse 95   Temp (!) 97.4 F (36.3 C)   Ht 5\' 8"  (1.727 m)   Wt 199 lb (90.3 kg)   SpO2 93%   BMI 30.26 kg/m   Physical Examination:  General: Awake, alert, No acute distress HEENT: Normal; sclera white Cardio: regular rate and rhythm, S1S2 heard, no murmurs appreciated Pulm: clear to auscultation bilaterally, no wheezes, rhonchi or rales; normal work of breathing on room air Extremities: warm, well perfused, No edema, cyanosis or clubbing; +2 pulses bilaterally SKIN: Golf ball sized area of soft tissue swelling with central punctum that is draining purulent fluid.  Mild to moderate associated erythema. Psych: Patient is jovial, interactive and pleasant Neuro: follows commands  MMSE - Mini Mental State Exam 01/15/2021 02/04/2020  Orientation to time 4 5  Orientation to Place 5 5  Registration 3 3  Attention/ Calculation 2 3  Recall 0 0  Language- name 2 objects 2 2  Language- repeat 1 1  Language- follow 3 step command 3 3  Language- read & follow direction 1 0  Write a sentence 1 0  Copy design 0 1  Total score 22 23    Assessment/ Plan: 80 y.o. male   Type 2 diabetes mellitus with stage 3a chronic kidney disease, without long-term current use of insulin ([place]) - Plan: Renal function panel, Bayer DCA Hb A1c Waived,  Microalbumin / creatinine urine ratio, dapagliflozin propanediol (FARXIGA) 10 MG TABS tablet, glipiZIDE (GLUCOTROL) 5 MG tablet, Semaglutide (RYBELSUS) 7 MG TABS, Ambulatory Referral to Neuro Rehab  Hyperlipidemia associated with type 2 diabetes mellitus ([place])  Hypertension associated with diabetes ([place])  Infected cyst of skin - Plan: doxycycline (VIBRA-TABS) 100 MG tablet, Ambulatory referral to General Surgery  Moderate episode of recurrent major depressive disorder ([place]) - Plan: mirtazapine (REMERON) 7.5 MG tablet  Generalized anxiety disorder - Plan: mirtazapine (REMERON) 7.5 MG tablet  Need for pneumococcal vaccination - Plan: Pneumococcal conjugate vaccine 20-valent (Prevnar 20)  Need for immunization against influenza - Plan: Flu Vaccine QUAD High Dose(Fluad)  Abnormal MMSE - Plan: Ambulatory referral to Neurology, Ambulatory Referral to Neuro Rehab  Impaired mobility and endurance - Plan: Ambulatory Referral to Neuro Rehab  Sugar at goal.  Will reduce glipizide to 5 mg and hopefully we can get him off of the glipizide and advance the Rybelsus to 14 instead.  Continue statin  Blood pressure controlled.  No changes  Lesion on back appears to be an infected cyst of the skin.  Start doxycycline.  Referral to general surgery for excision  Anxiety and depression continue to be an issue.  I will CC CCM as he already sees Almyra Free our clinical pharmacist but he would likely benefit from additional support by social work  MMSE shows mild dementia.  Referral to neurology has been placed  Influenza and pneumococcal vaccinations administered  Additionally, requested assessment for motorized wheelchair.  Patient currently using cane but has more and more difficulty walking/ balancing.  Orders Placed This Encounter  Procedures   Renal function panel   Bayer DCA Hb A1c Waived   No orders of the defined types were placed in this encounter.    [name], DO [place] [phone]

## 2021-01-16 LAB — RENAL FUNCTION PANEL
Albumin: 4.5 g/dL (ref 3.7–4.7)
BUN/Creatinine Ratio: 13 (ref 10–24)
BUN: 22 mg/dL (ref 8–27)
CO2: 22 mmol/L (ref 20–29)
Calcium: 9.3 mg/dL (ref 8.6–10.2)
Chloride: 93 mmol/L — ABNORMAL LOW (ref 96–106)
Creatinine, Ser: 1.67 mg/dL — ABNORMAL HIGH (ref 0.76–1.27)
Glucose: 263 mg/dL — ABNORMAL HIGH (ref 70–99)
Phosphorus: 3.8 mg/dL (ref 2.8–4.1)
Potassium: 4.1 mmol/L (ref 3.5–5.2)
Sodium: 134 mmol/L (ref 134–144)
eGFR: 41 mL/min/{1.73_m2} — ABNORMAL LOW (ref >59)

## 2021-01-16 LAB — MICROALBUMIN / CREATININE URINE RATIO
Creatinine, Urine: 53.4 mg/dL
Microalb/Creat Ratio: 29 mg/g creat (ref 0–29)
Microalbumin, Urine: 15.7 ug/mL

## 2021-01-17 NOTE — Addendum Note (Signed)
Addended by: [name] on: 01/17/2021 12:56 PM   Modules accepted: Orders

## 2021-01-18 ENCOUNTER — Ambulatory Visit (INDEPENDENT_AMBULATORY_CARE_PROVIDER_SITE_OTHER): Payer: Medicare Other | Admitting: Licensed Clinical Social Worker

## 2021-01-18 DIAGNOSIS — F331 Major depressive disorder, recurrent, moderate: Secondary | ICD-10-CM

## 2021-01-18 DIAGNOSIS — J449 Chronic obstructive pulmonary disease, unspecified: Secondary | ICD-10-CM

## 2021-01-18 DIAGNOSIS — E1159 Type 2 diabetes mellitus with other circulatory complications: Secondary | ICD-10-CM

## 2021-01-18 DIAGNOSIS — I152 Hypertension secondary to endocrine disorders: Secondary | ICD-10-CM

## 2021-01-18 DIAGNOSIS — G4733 Obstructive sleep apnea (adult) (pediatric): Secondary | ICD-10-CM

## 2021-01-18 DIAGNOSIS — F419 Anxiety disorder, unspecified: Secondary | ICD-10-CM

## 2021-01-18 DIAGNOSIS — E1169 Type 2 diabetes mellitus with other specified complication: Secondary | ICD-10-CM

## 2021-01-18 DIAGNOSIS — K219 Gastro-esophageal reflux disease without esophagitis: Secondary | ICD-10-CM

## 2021-01-18 DIAGNOSIS — I509 Heart failure, unspecified: Secondary | ICD-10-CM

## 2021-01-18 DIAGNOSIS — N1831 Chronic kidney disease, stage 3a: Secondary | ICD-10-CM

## 2021-01-18 NOTE — Patient Instructions (Signed)
Visit Information  PATIENT GOALS:  Goals Addressed             This Visit's Progress    .Manage emotions.  Manage depression symptoms       Timeframe:  Short-Term Goal Priority:  Medium Progress: On Track Start Date:            01/18/21                 Expected End Date:          04/17/21             Follow Up Date 03/13/21 at 3:30 PM   Manage Emotions. Manage Depression symptoms    Why is this important?   When you are stressed, down or upset, your body reacts too.  For example, your blood pressure may get higher; you may have a headache or stomachache.  When your emotions get the best of you, your body's ability to fight off cold and flu gets weak.  These steps will help you manage your emotions.     Patient Self Care Activities:  Completes ADLs as able Attends scheduled medical appointments  Patient Coping Strengths:  Support received from his wife, [employer] received from his son.  Patient Self Care Deficits:  Walking challenges  Patient Goals:  - spend time or talk with others at least 2 to 3 times per week - practice relaxation or meditation daily - keep a calendar with appointment dates  Follow Up Plan: LCSW to call client on 03/13/21 at 3:30 PM to assess client needs     [name].Abass Misener MSW, LCSW Licensed Clinical Social Worker [facility] Care Management [phone]

## 2021-01-18 NOTE — Chronic Care Management (AMB) (Signed)
Chronic Care Management    Clinical Social Work Note  01/18/2021 Name: Matthew Campos MRN: [id number] DOB: [birth year]  Matthew Campos is a 80 y.o. year old male who is a primary care patient of [name], DO. The CCM team was consulted to assist the patient with chronic disease management and/or care coordination needs related to: [employer] .   Engaged with patient by telephone for follow up visit in response to provider referral for social work chronic care management and care coordination services.   Consent to Services:  The patient was given information about Chronic Care Management services, agreed to services, and gave verbal consent prior to initiation of services.  Please see initial visit note for detailed documentation.   Patient agreed to services and consent obtained.   Assessment: Review of patient past medical history, allergies, medications, and health status, including review of relevant consultants reports was performed today as part of a comprehensive evaluation and provision of chronic care management and care coordination services.     SDOH (Social Determinants of Health) assessments and interventions performed:  SDOH Interventions    Flowsheet Row Most Recent Value  SDOH Interventions   Physical Activity Interventions Other (Comments)  [client has walking challenges,  he uses a walker to help him walk]  Depression Interventions/Treatment  Currently on Treatment        Advanced Directives Status: See Vynca application for related entries.  CCM Care Plan  Allergies  Allergen Reactions   Codeine Nausea And Vomiting   Codeine    Latex Hives and Itching   Lisinopril    Lisinopril Cough    Outpatient Encounter Medications as of 01/18/2021  Medication Sig   acetaZOLAMIDE (DIAMOX) 250 MG tablet Take by mouth.   albuterol (PROVENTIL) (2.5 MG/3ML) 0.083% nebulizer solution Take 2.5 mg by nebulization every 6 (six) hours as needed for  wheezing or shortness of breath.   albuterol (VENTOLIN HFA) 108 (90 Base) MCG/ACT inhaler Inhale 2 puffs into the lungs every 6 (six) hours as needed for wheezing or shortness of breath.   aspirin EC 81 MG tablet Take 81 mg by mouth daily.   calcium carbonate (OSCAL) 1500 (600 Ca) MG TABS tablet Take by mouth 2 (two) times daily with a meal.   clopidogrel (PLAVIX) 75 MG tablet TAKE ONE (1) TABLET EACH DAY   dapagliflozin propanediol (FARXIGA) 10 MG TABS tablet TAKE ONE TABLET EACH MORNING BEFORE BREAKFAST   dextromethorphan-guaiFENesin (MUCINEX DM) 30-600 MG 12hr tablet Take 1 tablet by mouth 2 (two) times daily.   doxycycline (VIBRA-TABS) 100 MG tablet Take 1 tablet (100 mg total) by mouth 2 (two) times daily for 10 days.   ferrous sulfate 325 (65 FE) MG EC tablet Take 325 mg by mouth every Monday, Wednesday, and Friday.    glipiZIDE (GLUCOTROL) 5 MG tablet Take 1 tablet (5 mg total) by mouth daily before breakfast.   levothyroxine (SYNTHROID) 100 MCG tablet TAKE ONE (1) TABLET EACH DAY   lubiprostone (AMITIZA) 24 MCG capsule TAKE 1 CAPSULE TWICE A DAY WITH MEALS.   mirtazapine (REMERON) 7.5 MG tablet Take 1 tablet (7.5 mg total) by mouth at bedtime. STOP LEXAPRO   Multiple Vitamin (MULTIVITAMIN WITH MINERALS) TABS Take 1 tablet by mouth daily.   nitroGLYCERIN (NITROSTAT) 0.4 MG SL tablet Place 1 tablet (0.4 mg total) under the tongue every 5 (five) minutes as needed. For chest pain   ondansetron (ZOFRAN-ODT) 4 MG disintegrating tablet TAKE 1 TABLET EVERY 8 HOURS AS  NEEDED FOR NAUSEA AND VOMITING   pantoprazole (PROTONIX) 40 MG tablet TAKE ONE (1) TABLET EACH DAY   rosuvastatin (CRESTOR) 10 MG tablet TAKE ONE (1) TABLET EACH DAY   Semaglutide (RYBELSUS) 7 MG TABS Take 7 mg by mouth daily.   sodium chloride (OCEAN) 0.65 % nasal spray Place into the nose.   spironolactone (ALDACTONE) 25 MG tablet Take 25 mg by mouth daily.    SYMBICORT 80-4.5 MCG/ACT inhaler Inhale 2 puffs into the lungs 2 (two)  times daily as needed.   torsemide (DEMADEX) 20 MG tablet TAKE TWO TABLETS BY MOUTH TWICE DAILY   traZODone (DESYREL) 50 MG tablet TAKE 1/2 TO 1 TABLET AT BEDTIME AS NEEDED FOR SLEEP   No facility-administered encounter medications on file as of 01/18/2021.    Patient Active Problem List   Diagnosis Date Noted   Viral upper respiratory tract infection 10/25/2020   Hyperlipidemia associated with type 2 diabetes mellitus ([place]) 02/04/2020   CAP (community acquired pneumonia) 09/07/2019   Hypomagnesemia 09/07/2019   CKD (chronic kidney disease), stage III ([place]) 09/07/2019   Volume overload 09/07/2019   Community acquired pneumonia 09/07/2019   GI bleeding 04/14/2019   Acute on chronic diastolic heart failure (HCC)    AKI (acute kidney injury) ([place])    Anemia due to blood loss    Bradycardia    Renal insufficiency    UGI bleed 04/13/2019   Chest pain 04/13/2019   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/13/2019   Cholecystostomy care ([place]) 04/13/2019   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   COPD (chronic obstructive pulmonary disease) (HCC)    Coronary artery disease    Chronic ischemic heart disease    Paroxysmal atrial fibrillation (HCC)    Cholecystitis    OSA (obstructive sleep apnea)    Coagulopathy (HCC)    Chronic anticoagulation    Dysphagia    Elevated alkaline phosphatase measurement    Elevated bilirubin    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   Sinus bradycardia 02/18/2019   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   Normocytic anemia 12/23/2018   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[place]) 12/23/2018   OSA (obstructive sleep apnea) 12/23/2018   Severe aortic stenosis 11/26/2018   Anxiety 10/28/2018   CHF (congestive heart failure) ([place]) 10/28/2018   At risk for falls 10/28/2018   Oxygen dependent 10/28/2018   Major depressive disorder, single episode, severe without psychotic features ([place]) 07/21/2017    Thrombocytopenia ([place]) 07/21/2017   COPD (chronic obstructive pulmonary disease) ([place]) 12/27/2015   CAD (coronary artery disease) 12/27/2015   Gastroesophageal reflux disease 12/27/2015   Hypertension associated with diabetes ([place]) 12/27/2015   Hyperlipidemia 09/09/2013   Type 2 diabetes mellitus ([place]) 09/09/2013    Conditions to be addressed/monitored: monitor client management of depression issues  Care Plan : LCSW Care Plan  Updates made by Katha Cabal, LCSW since 01/18/2021 12:00 AM     Problem: Emotional Distress      Goal: Emotional Health Supported; Manage Depression symptoms faced   Start Date: 01/18/2021  Expected End Date: 04/18/2021  This Visit's Progress: On track  Priority: Medium  Note:   Current Barriers:  Mental Health needs related to depression and depression management Mobility issues Suicidal Ideation/Homicidal Ideation: No  Clinical Social Work Goal(s):  patient will work with SW monthly by telephone or in person to reduce or manage symptoms related to depression and depression management Client to call RNCM as needed in next 30 days  to address nursing needs of client Patient will communicate with LCSW in next 30 days to discuss mobility challenges of client  Interventions: 1:1 collaboration with [name], DO regarding development and update of comprehensive plan of care as evidenced by provider attestation and co-signature Discussed with client the mobility of client. He said it is difficult for him to walk longer distances, He uses a walker to ambulate Discussed food intake with Matthew Campos. He said he has reduced appetite Reviewed transport needs with client. He said he drives short distances; he said his wife often transports him to and from appointments Reviewed family support with Matthew Campos, including support from his spouse and from his son Matthew Campos client regarding client mood He said he does get sad occasionally.  He said he does get angry  occasionally. Talked with client about relaxation techniques to help him relax. He likes being outdoors, likes mowing yard, likes sitting on his porch and looking at nature. Interviewed client related to oxygen use. He said he uses oxygen at night to help him with breathing Reviewed with Matthew Campos the skin care needs of client. He said he has an area on his back that has been draining.  He is taking antibiotics as prescribed. His wife is cleaning and dressing area on his back as needed.   Provided counseling support for client  Patient Self Care Activities:  Completes ADLs as able Attends scheduled medical appointments  Patient Coping Strengths:  Support received from his wife, [place] received from his son.  Patient Self Care Deficits:  Walking challenges  Patient Goals:  - spend time or talk with others at least 2 to 3 times per week - practice relaxation or meditation daily - keep a calendar with appointment dates  Follow Up Plan: LCSW to call client on 03/13/21 at 3:30 PM to assess client needs     Matthew Campos MSW, LCSW Licensed Clinical Social Worker [facility] Care Management [phone]

## 2021-01-27 ENCOUNTER — Other Ambulatory Visit: Payer: Self-pay | Admitting: Family Medicine

## 2021-01-27 DIAGNOSIS — J449 Chronic obstructive pulmonary disease, unspecified: Secondary | ICD-10-CM

## 2021-01-27 DIAGNOSIS — I5033 Acute on chronic diastolic (congestive) heart failure: Secondary | ICD-10-CM

## 2021-01-27 DIAGNOSIS — K5909 Other constipation: Secondary | ICD-10-CM

## 2021-01-30 ENCOUNTER — Encounter: Payer: Self-pay | Admitting: General Surgery

## 2021-01-30 ENCOUNTER — Ambulatory Visit (INDEPENDENT_AMBULATORY_CARE_PROVIDER_SITE_OTHER): Payer: Medicare Other | Admitting: General Surgery

## 2021-01-30 ENCOUNTER — Other Ambulatory Visit: Payer: Self-pay

## 2021-01-30 VITALS — BP 106/63 | HR 80 | Temp 97.9°F | Resp 14 | Ht 68.0 in | Wt 201.0 lb

## 2021-01-30 DIAGNOSIS — L089 Local infection of the skin and subcutaneous tissue, unspecified: Secondary | ICD-10-CM

## 2021-01-30 DIAGNOSIS — L723 Sebaceous cyst: Secondary | ICD-10-CM | POA: Diagnosis not present

## 2021-01-30 HISTORY — DX: Local infection of the skin and subcutaneous tissue, unspecified: L08.9

## 2021-01-30 MED ORDER — OXYCODONE HCL 5 MG PO TABS: 5.0000 mg | ORAL_TABLET | ORAL | 0 refills | Status: DC | PRN

## 2021-01-30 NOTE — Patient Instructions (Signed)
Flush area with 2-3cc of saline and then remove packing. Replace packing daily. Cover with bandaid. Finish antibiotic.  Tylenol and ibuprofen for pain. Roxicodone for breakthrough.

## 2021-01-30 NOTE — Progress Notes (Signed)
[facility] History and Physical  Reason for Referral: Left shoulder infected cyst  Referring Physician: [name], DO   Chief Complaint   New Patient (Initial Visit)     Matthew Campos is a 80 y.o. male.  HPI: Mr. Beckley is a 80 yo with a recent growing cyst on his left shoulder that became red and inflamed. The cyst had some reported purulence drainage and was treated with antibiotics.  He says that since it was infected it has shrunk down and is less swollen.  He says that the area has probably been there for a while but has never been infected.  He I ready to have it excised. He is on plavix.  He denies any fever or chills.   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    Anemia    Anxiety and depression    Aortic stenosis    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HCC)    CHF (congestive heart failure) (HCC)    Cholecystitis    Chronic ischemic heart disease    Complication of anesthesia    hard to be put to sleep   COPD (chronic obstructive pulmonary disease) ([place])    Coronary artery disease    Diabetes mellitus without complication ([place])    Diabetes mellitus without complication ([place])    takes Januvia and Metformin daily   GERD (gastroesophageal reflux disease)    GERD (gastroesophageal reflux disease)    takes omeprazole daily   Headache(784.0)    sinus   History of bronchitis    last time >75yrs ago   Hyperlipidemia    takes Lipitor daily   Hypertension    Hypertension    takes Metoprolol daily   Joint pain    Myocardial infarction ([place])    x 3;last one about 3-64yrs ago   Neoplasm of uncertain behavior of bladder    Obesity    OSA (obstructive sleep apnea)    Pacemaker    Pancytopenia (HCC)    Paroxysmal atrial fibrillation (HCC)    Pneumonia    last itme about 64yrs ago   S/P TAVR (transcatheter aortic valve replacement)    Severe aortic  stenosis    Sinus bradycardia     Past Surgical History:  Procedure Laterality Date   abdominal aneurysm stenting     ABDOMINAL AORTIC ANEURYSM REPAIR     per patient stents placed about 4-5 years ago   AORTIC VALVE REPLACEMENT     stated it was done in November   cholecystostomy tube     CORONARY ANGIOPLASTY  2010   CORONARY ANGIOPLASTY WITH STENT PLACEMENT     about 30 years ago per patient   cyst removed from left wrist     ESOPHAGOGASTRODUODENOSCOPY (EGD) WITH PROPOFOL N/A 04/15/2019   Procedure: ESOPHAGOGASTRODUODENOSCOPY (EGD) WITH PROPOFOL;  Surgeon: Danie Binder, MD;  [facility];  Service: Endoscopy;  Laterality: N/A;  possible dilation   HERNIA REPAIR     left shoulder surgery     POLYPECTOMY  04/15/2019   Procedure: POLYPECTOMY;  Surgeon: Danie Binder, MD;  Location: [facility];  Service: Endoscopy;;  duodenal    SHOULDER ARTHROSCOPY WITH ROTATOR CUFF REPAIR AND SUBACROMIAL DECOMPRESSION  04/17/2012   Procedure: SHOULDER ARTHROSCOPY WITH ROTATOR CUFF REPAIR AND SUBACROMIAL DECOMPRESSION;  Surgeon: Yvette Rack., MD;  Location: [place];  Service: Orthopedics;  Laterality: Right;  RIGHT SHOULDER ROTATOR CUFF REPAIR INCLUDING ACROMIOPLASTY CHRONIC, ARTHROSCOPY SHOULDER DEBRIDEMENT EXTENSIVE   TONSILLECTOMY     VALVE REPLACEMENT      Family History  Problem Relation Age of Onset   Cancer Mother        Unknown type   Heart disease Father    Diabetes Father    Heart disease Sister    Seizures Brother    Diabetes Daughter    Diabetes Daughter    Heart attack Son    Colon cancer Mother        in her 35s.    Stomach cancer Neg Hx    Esophageal cancer Neg Hx     Social History   Tobacco Use   Smoking status: Former   Smokeless tobacco: Never   Tobacco comments:    quit smoking 20+yrs ago  Vaping Use   Vaping Use: Never used  Substance Use Topics   Alcohol use: No    Comment: last drank about 2-3 years ago   Drug use: No    Medications:  I have reviewed the patient's current medications. Allergies as of 01/30/2021       Reactions   Codeine Nausea And Vomiting   Codeine    Latex Hives, Itching   Lisinopril    Lisinopril Cough        Medication List        Accurate as of January 30, 2021 12:26 PM. If you have any questions, ask your nurse or doctor.          acetaZOLAMIDE 250 MG tablet Commonly known as: DIAMOX Take by mouth.   albuterol (2.5 MG/3ML) 0.083% nebulizer solution Commonly known as: PROVENTIL Take 2.5 mg by nebulization every 6 (six) hours as needed for wheezing or shortness of breath.   albuterol 108 (90 Base) MCG/ACT inhaler Commonly known as: VENTOLIN HFA INHALE 2 PUFFS INTO THE LUNGS EVERY 6 HOURS AS NEEDED FOR WHEEZING OR SHORTNESS OF BREATH   aspirin EC 81 MG tablet Take 81 mg by mouth daily.   calcium carbonate 1500 (600 Ca) MG Tabs tablet Commonly known as: OSCAL Take by mouth 2 (two) times daily with a meal.   clopidogrel 75 MG tablet Commonly known as: PLAVIX TAKE ONE (1) TABLET EACH DAY   dapagliflozin propanediol 10 MG Tabs tablet Commonly known as: Farxiga TAKE ONE TABLET EACH MORNING BEFORE BREAKFAST   dextromethorphan-guaiFENesin 30-600 MG 12hr tablet Commonly known as: MUCINEX DM Take 1 tablet by mouth 2 (two) times daily.   ferrous sulfate 325 (65 FE) MG EC tablet Take 325 mg by mouth every Monday, Wednesday, and Friday.   glipiZIDE 5 MG tablet Commonly known as: GLUCOTROL Take 1 tablet (5 mg total) by mouth daily before breakfast.   levothyroxine 100 MCG tablet Commonly known as: SYNTHROID TAKE ONE (1) TABLET EACH DAY   lubiprostone 24 MCG capsule Commonly known as: AMITIZA TAKE 1 CAPSULE TWICE A DAY WITH MEALS.   mirtazapine 7.5 MG tablet Commonly known as: REMERON Take 1 tablet (7.5 mg total) by mouth at bedtime. STOP LEXAPRO   multivitamin with minerals Tabs tablet Take 1 tablet by mouth daily.   nitroGLYCERIN 0.4 MG SL tablet Commonly known  as: NITROSTAT Place 1 tablet (0.4 mg total) under the tongue every 5 (five) minutes as needed. For chest pain   ondansetron 4 MG disintegrating tablet Commonly known as: ZOFRAN-ODT TAKE 1 TABLET EVERY 8 HOURS AS NEEDED FOR NAUSEA AND VOMITING   pantoprazole 40  MG tablet Commonly known as: PROTONIX TAKE ONE (1) TABLET EACH DAY   rosuvastatin 10 MG tablet Commonly known as: CRESTOR TAKE ONE (1) TABLET EACH DAY   Rybelsus 7 MG Tabs Generic drug: Semaglutide Take 7 mg by mouth daily.   sodium chloride 0.65 % nasal spray Commonly known as: OCEAN Place into the nose.   spironolactone 25 MG tablet Commonly known as: ALDACTONE Take 25 mg by mouth daily.   Symbicort 80-4.5 MCG/ACT inhaler Generic drug: budesonide-formoterol Inhale 2 puffs into the lungs 2 (two) times daily as needed.   torsemide 20 MG tablet Commonly known as: DEMADEX TAKE TWO TABLETS BY MOUTH TWICE DAILY   traZODone 50 MG tablet Commonly known as: DESYREL TAKE 1/2 TO 1 TABLET AT BEDTIME AS NEEDED FOR SLEEP         ROS:  A comprehensive review of systems was negative except for: Respiratory: positive for cough and SOB Musculoskeletal: positive for back pain Left shoulder cyst that has been draining   Blood pressure 106/63, pulse 80, temperature 97.9 F (36.6 C), temperature source Other (Comment), resp. rate 14, height 5\' 8"  (1.727 m), weight 201 lb (91.2 kg), SpO2 92 %. Physical Exam Vitals reviewed.  Constitutional:      Appearance: Normal appearance.  HENT:     Head: Normocephalic.     Nose: Nose normal.     Mouth/Throat:     Mouth: Mucous membranes are moist.  Eyes:     Extraocular Movements: Extraocular movements intact.  Cardiovascular:     Rate and Rhythm: Normal rate.  Pulmonary:     Effort: Pulmonary effort is normal.  Abdominal:     General: There is no distension.     Palpations: Abdomen is soft.     Tenderness: There is no abdominal tenderness.  Musculoskeletal:         General: Normal range of motion.     Comments: Left shoulder cyst with central pit  Skin:    General: Skin is warm.  Neurological:     General: No focal deficit present.     Mental Status: He is alert and oriented to person, place, and time.  Psychiatric:        Mood and Affect: Mood normal.        Behavior: Behavior normal.        Thought Content: Thought content normal.        Judgment: Judgment normal.    Procedure: Excision of cyst 2cm  Diagnosis: Sebaceous cyst 2cm   Description: The left shoulder area was prepped and lidocaine 1% was injected. An incision was made in the skin over the cyst. Using sharp dissection and blunt dissection, keratin was expressed, the cyst wall was removed in its entirety. The cavity was made hemostatic with silver nitrate. The wound was packed with iodoform gauze and covered with a bandaid.   Assessment & Plan:  Matthew Campos is a 80 y.o. male with a recently infected sebaceous cyst. The patient tolerated excision in the office with local. The wound was packed.  Flush area with 2-3cc of saline and then remove packing. Replace packing daily. Cover with bandaid. Finish antibiotic.  Tylenol and ibuprofen for pain. Roxicodone for breakthrough.   Future Appointments  Date Time Provider [place]  02/08/2021  1:30 PM Virl Cagey, MD [facility] None  02/13/2021 10:00 AM [facility] PHARMACIST [facility] None  02/15/2021 11:00 AM Genia Harold, MD [facility] None  03/13/2021  3:30 PM [facility] SOCIAL WORK [facility] None  04/19/2021  9:30 AM [name], DO [facility] None  07/27/2021  9:45 AM [facility]-ANNUAL WELLNESS VISIT [facility] None    All questions were answered to the satisfaction of the patient and family.   Virl Cagey 01/30/2021, 12:26 PM

## 2021-01-31 DIAGNOSIS — J449 Chronic obstructive pulmonary disease, unspecified: Secondary | ICD-10-CM | POA: Diagnosis not present

## 2021-02-08 ENCOUNTER — Encounter: Payer: Medicare Other | Admitting: General Surgery

## 2021-02-08 ENCOUNTER — Other Ambulatory Visit: Payer: Self-pay

## 2021-02-08 ENCOUNTER — Ambulatory Visit (INDEPENDENT_AMBULATORY_CARE_PROVIDER_SITE_OTHER): Payer: Medicare Other | Admitting: General Surgery

## 2021-02-08 ENCOUNTER — Encounter: Payer: Self-pay | Admitting: General Surgery

## 2021-02-08 VITALS — BP 114/70 | HR 78 | Temp 98.2°F | Resp 18 | Ht 68.0 in | Wt 197.0 lb

## 2021-02-08 DIAGNOSIS — L089 Local infection of the skin and subcutaneous tissue, unspecified: Secondary | ICD-10-CM

## 2021-02-08 DIAGNOSIS — L723 Sebaceous cyst: Secondary | ICD-10-CM

## 2021-02-08 NOTE — Progress Notes (Signed)
[facility]  Right shoulder infected cyst area with scabbing, removed scab and greenish drainage. Concern for area of wall, which I silver nitrated.   BP 114/70   Pulse 78   Temp 98.2 F (36.8 C) (Other (Comment))   Resp 18   Ht 5\' 8"  (1.727 m)   Wt 197 lb (89.4 kg)   SpO2 90%   BMI 29.95 kg/m   Packed with neosporin.   Neosporin to the area daily to twice daily. Clean with saline before reapplication.   Future Appointments  Date Time Provider [place]  02/13/2021 10:00 AM [facility] PHARMACIST [facility] None  02/15/2021 11:00 AM Genia Harold, MD [facility] None  02/22/2021  1:15 PM Virl Cagey, MD [facility] None  03/13/2021  3:30 PM [facility] SOCIAL WORK [facility] None  04/19/2021  9:30 AM [name], DO [facility] None  07/27/2021  9:45 AM [facility]-ANNUAL WELLNESS VISIT [facility] None   Curlene Labrum, MD [facility] [address] [place], [place] [address] [phone] (office)

## 2021-02-08 NOTE — Patient Instructions (Signed)
Neosporin to the area daily to twice daily. Clean with saline before reapplication.

## 2021-02-12 ENCOUNTER — Telehealth: Payer: Self-pay | Admitting: Family Medicine

## 2021-02-12 DIAGNOSIS — I152 Hypertension secondary to endocrine disorders: Secondary | ICD-10-CM | POA: Diagnosis not present

## 2021-02-12 DIAGNOSIS — E1169 Type 2 diabetes mellitus with other specified complication: Secondary | ICD-10-CM

## 2021-02-12 DIAGNOSIS — E1122 Type 2 diabetes mellitus with diabetic chronic kidney disease: Secondary | ICD-10-CM

## 2021-02-12 DIAGNOSIS — E785 Hyperlipidemia, unspecified: Secondary | ICD-10-CM | POA: Diagnosis not present

## 2021-02-12 DIAGNOSIS — I509 Heart failure, unspecified: Secondary | ICD-10-CM | POA: Diagnosis not present

## 2021-02-12 DIAGNOSIS — F331 Major depressive disorder, recurrent, moderate: Secondary | ICD-10-CM | POA: Diagnosis not present

## 2021-02-12 DIAGNOSIS — J449 Chronic obstructive pulmonary disease, unspecified: Secondary | ICD-10-CM | POA: Diagnosis not present

## 2021-02-12 DIAGNOSIS — N1831 Chronic kidney disease, stage 3a: Secondary | ICD-10-CM | POA: Diagnosis not present

## 2021-02-12 DIAGNOSIS — E1159 Type 2 diabetes mellitus with other circulatory complications: Secondary | ICD-10-CM

## 2021-02-12 NOTE — Telephone Encounter (Signed)
He has a 10.1% chance of death within 30 days of surgery based on his RCRI (this is assuming he is put under sedation).  I certainly would want his cardiologist involved in the decision making for Plavix and for cardiac clearance before he pursues extraction, particularly given his poor health over the last year.

## 2021-02-12 NOTE — Telephone Encounter (Signed)
Will try to call back tomorrow

## 2021-02-12 NOTE — Telephone Encounter (Signed)
Patient is needing multiple extractions. Do you think he is healthy enough to go through multiple extractions in one day. If so how many days before extractions does he need to stop plavix and then when does he need to start the plavix if you are ok with him having the extractions completed?

## 2021-02-13 ENCOUNTER — Ambulatory Visit (INDEPENDENT_AMBULATORY_CARE_PROVIDER_SITE_OTHER): Payer: Medicare Other | Admitting: Pharmacist

## 2021-02-13 DIAGNOSIS — N1832 Chronic kidney disease, stage 3b: Secondary | ICD-10-CM

## 2021-02-13 DIAGNOSIS — E1169 Type 2 diabetes mellitus with other specified complication: Secondary | ICD-10-CM

## 2021-02-13 DIAGNOSIS — E785 Hyperlipidemia, unspecified: Secondary | ICD-10-CM

## 2021-02-15 ENCOUNTER — Encounter: Payer: Self-pay | Admitting: Psychiatry

## 2021-02-15 ENCOUNTER — Telehealth: Payer: Self-pay | Admitting: *Deleted

## 2021-02-15 ENCOUNTER — Ambulatory Visit (INDEPENDENT_AMBULATORY_CARE_PROVIDER_SITE_OTHER): Payer: Medicare Other | Admitting: Psychiatry

## 2021-02-15 VITALS — BP 109/70 | HR 72 | Ht 68.0 in | Wt 198.0 lb

## 2021-02-15 DIAGNOSIS — R413 Other amnesia: Secondary | ICD-10-CM

## 2021-02-15 MED ORDER — DONEPEZIL HCL 5 MG PO TABS: ORAL_TABLET | ORAL | 2 refills | Status: DC

## 2021-02-15 NOTE — Progress Notes (Signed)
[facility]  PATIENT: Matthew Campos DOB: [birth year]  REFERRING CLINICIAN: [name], DO HISTORY FROM: self, wife, and granddaughter REASON FOR VISIT: memory loss   HISTORICAL  CHIEF COMPLAINT:  Chief Complaint  Patient presents with   Memory Loss    RM 1 with wife [place] and granddaughter [name]  Pt has been having a memory complications in the last yr, in the last 6 months it has worsen.  States his short term has decreased. Has had a few fall and hit his heads.     HISTORY OF PRESENT ILLNESS:  The patient presents for evaluation of progressive memory loss over the past couple of years. Mostly struggles with short term memory. He will lost objects because he forgets where he puts things. Talks a lot about the past, has good remote memory but will forget what happened 10 minutes ago. Will call his friends and then forget he spoke with them 15 minutes later. Repeats himself frequently in conversation.  Notes he was very sick with pneumonia and was hospitalized for almost one month in January 2020. Still requires supplemental oxygen. This occurred before COVID testing was available, but family suspects this is what he had. Feels memory started to decline around this time.  TBI:  [place] [address] and hit his head a couple of years ago Stroke: No past history of stroke Seizures: No past history of seizures Sleep: Snores at night and feels tired during the day. Does have a history of OSA. Doesn't use a CPAP but is on supplemental oxygen. Used CPAP 10 years ago but it got repossessed by insurance Mood: shorter temper, feels more easily frustrated lately  Functional status: Patient lives with wife Cooking: wife does most of the cooking Cleaning: wife does the Research scientist (life sciences): often forgets to buy items at the store Driving: wife is always with him when he drives, no accidents but he does get lost Bills: has forgotten to pay bills several times Medications:  uses a pill box and wife has to be sure he takes them. States he wouldn't take them without her Ever left the stove on by accident?: no Getting lost going to familiar places?: yes, panics when he gets lost Forgetting loved ones names?: will forget family member's names Word finding difficulty? yes  Had a fall in March 2021, [facility] showed scalp swelling with no acute intracranial process. There was evidence of generalized brain volume loss  OTHER MEDICAL CONDITIONS: DM, HLD, HTN, AAA s/p repair, aortic stenosis s/p TAVR, CHF, MI x3, s/p pacemaker, anxiety, depression   REVIEW OF SYSTEMS: Full 14 system review of systems performed and negative with exception of: memory loss  ALLERGIES: Allergies  Allergen Reactions   Codeine Nausea And Vomiting   Codeine    Latex Hives and Itching   Lisinopril    Lisinopril Cough    HOME MEDICATIONS: Outpatient Medications Prior to Visit  Medication Sig Dispense Refill   acetaZOLAMIDE (DIAMOX) 250 MG tablet Take by mouth.     albuterol (PROVENTIL) (2.5 MG/3ML) 0.083% nebulizer solution Take 2.5 mg by nebulization every 6 (six) hours as needed for wheezing or shortness of breath.     albuterol (VENTOLIN HFA) 108 (90 Base) MCG/ACT inhaler INHALE 2 PUFFS INTO THE LUNGS EVERY 6 HOURS AS NEEDED FOR WHEEZING OR SHORTNESS OF BREATH 8.5 g 1   aspirin EC 81 MG tablet Take 81 mg by mouth daily.     calcium carbonate (OSCAL) 1500 (600 Ca) MG TABS tablet Take by mouth  2 (two) times daily with a meal.     clopidogrel (PLAVIX) 75 MG tablet TAKE ONE (1) TABLET EACH DAY 90 tablet 0   dapagliflozin propanediol (FARXIGA) 10 MG TABS tablet TAKE ONE TABLET EACH MORNING BEFORE BREAKFAST 30 tablet 12   dextromethorphan-guaiFENesin (MUCINEX DM) 30-600 MG 12hr tablet Take 1 tablet by mouth 2 (two) times daily. 30 tablet 0   ferrous sulfate 325 (65 FE) MG EC tablet Take 325 mg by mouth every Monday, Wednesday, and Friday.      glipiZIDE (GLUCOTROL) 5 MG tablet Take 1 tablet (5  mg total) by mouth daily before breakfast. 90 tablet 3   levothyroxine (SYNTHROID) 100 MCG tablet TAKE ONE (1) TABLET EACH DAY 90 tablet 3   lubiprostone (AMITIZA) 24 MCG capsule TAKE 1 CAPSULE TWICE A DAY WITH MEALS. 60 capsule 2   mirtazapine (REMERON) 7.5 MG tablet Take 1 tablet (7.5 mg total) by mouth at bedtime. STOP LEXAPRO 90 tablet 3   Multiple Vitamin (MULTIVITAMIN WITH MINERALS) TABS Take 1 tablet by mouth daily.     nitroGLYCERIN (NITROSTAT) 0.4 MG SL tablet Place 1 tablet (0.4 mg total) under the tongue every 5 (five) minutes as needed. For chest pain 25 tablet 2   ondansetron (ZOFRAN-ODT) 4 MG disintegrating tablet TAKE 1 TABLET EVERY 8 HOURS AS NEEDED FOR NAUSEA AND VOMITING 20 tablet 0   oxyCODONE (ROXICODONE) 5 MG immediate release tablet Take 1 tablet (5 mg total) by mouth every 4 (four) hours as needed for severe pain or breakthrough pain. 5 tablet 0   pantoprazole (PROTONIX) 40 MG tablet TAKE ONE (1) TABLET EACH DAY 90 tablet 0   rosuvastatin (CRESTOR) 10 MG tablet TAKE ONE (1) TABLET EACH DAY 90 tablet 3   Semaglutide (RYBELSUS) 7 MG TABS Take 7 mg by mouth daily. 30 tablet 12   sodium chloride (OCEAN) 0.65 % nasal spray Place into the nose.     SYMBICORT 80-4.5 MCG/ACT inhaler Inhale 2 puffs into the lungs 2 (two) times daily as needed.     torsemide (DEMADEX) 20 MG tablet TAKE TWO TABLETS BY MOUTH TWICE DAILY 120 tablet 2   traZODone (DESYREL) 50 MG tablet TAKE 1/2 TO 1 TABLET AT BEDTIME AS NEEDED FOR SLEEP 90 tablet 1   spironolactone (ALDACTONE) 25 MG tablet Take 25 mg by mouth daily.      No facility-administered medications prior to visit.    PAST MEDICAL HISTORY: 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    Anemia    Anxiety and depression    Aortic stenosis    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HCC)    CHF (congestive heart failure) (HCC)    Cholecystitis    Chronic  ischemic heart disease    Complication of anesthesia    hard to be put to sleep   COPD (chronic obstructive pulmonary disease) ([place])    Coronary artery disease    Diabetes mellitus without complication ([place])    Diabetes mellitus without complication ([place])    takes Januvia and Metformin daily   GERD (gastroesophageal reflux disease)    GERD (gastroesophageal reflux disease)    takes omeprazole daily   Headache(784.0)    sinus   History of bronchitis    last time >54yrs ago   Hyperlipidemia    takes Lipitor daily   Hypertension    Hypertension    takes Metoprolol daily   Joint  pain    Myocardial infarction ([place])    x 3;last one about 3-34yrs ago   Neoplasm of uncertain behavior of bladder    Obesity    OSA (obstructive sleep apnea)    Pacemaker    Pancytopenia (HCC)    Paroxysmal atrial fibrillation (HCC)    Pneumonia    last itme about 67yrs ago   S/P TAVR (transcatheter aortic valve replacement)    Severe aortic stenosis    Sinus bradycardia     PAST SURGICAL HISTORY: Past Surgical History:  Procedure Laterality Date   abdominal aneurysm stenting     ABDOMINAL AORTIC ANEURYSM REPAIR     per patient stents placed about 4-5 years ago   AORTIC VALVE REPLACEMENT     stated it was done in November   cholecystostomy tube     CORONARY ANGIOPLASTY  2010   CORONARY ANGIOPLASTY WITH STENT PLACEMENT     about 30 years ago per patient   cyst removed from left wrist     ESOPHAGOGASTRODUODENOSCOPY (EGD) WITH PROPOFOL N/A 04/15/2019   Procedure: ESOPHAGOGASTRODUODENOSCOPY (EGD) WITH PROPOFOL;  Surgeon: Danie Binder, MD;  [facility];  Service: Endoscopy;  Laterality: N/A;  possible dilation   HERNIA REPAIR     left shoulder surgery     POLYPECTOMY  04/15/2019   Procedure: POLYPECTOMY;  Surgeon: Danie Binder, MD;  Location: [facility];  Service: Endoscopy;;  duodenal    SHOULDER ARTHROSCOPY WITH ROTATOR CUFF REPAIR AND SUBACROMIAL DECOMPRESSION  04/17/2012    Procedure: SHOULDER ARTHROSCOPY WITH ROTATOR CUFF REPAIR AND SUBACROMIAL DECOMPRESSION;  Surgeon: Yvette Rack., MD;  Location: [facility];  Service: Orthopedics;  Laterality: Right;  RIGHT SHOULDER ROTATOR CUFF REPAIR INCLUDING ACROMIOPLASTY CHRONIC, ARTHROSCOPY SHOULDER DEBRIDEMENT EXTENSIVE   TONSILLECTOMY     VALVE REPLACEMENT      FAMILY HISTORY: Family History  Problem Relation Age of Onset   Cancer Mother        Unknown type   Heart disease Father    Diabetes Father    Heart disease Sister    Seizures Brother    Diabetes Daughter    Diabetes Daughter    Heart attack Son    Colon cancer Mother        in her 60s.    Stomach cancer Neg Hx    Esophageal cancer Neg Hx     SOCIAL HISTORY: Social History   Socioeconomic History   Marital status: Married    Spouse name: Not on file   Number of children: Not on file   Years of education: Not on file   Highest education level: Not on file  Occupational History   Occupation: retired  Tobacco Use   Smoking status: Former   Smokeless tobacco: Never   Tobacco comments:    quit smoking 20+yrs ago  Scientific laboratory technician Use: Never used  Substance and Sexual Activity   Alcohol use: No    Comment: last drank about 2-3 years ago   Drug use: No   Sexual activity: Yes  Other Topics Concern   Not on file  Social History Narrative   Lives with his wife - their grandson lives with them at times       Social Determinants of Health   Financial Resource Strain: Low Risk    Difficulty of Paying Living Expenses: Not very hard  Food Insecurity: No Food Insecurity   Worried About [place] in the Last Year:  Never true   Ran Out of Food in the Last Year: Never true  Transportation Needs: No Transportation Needs   Lack of Transportation (Medical): No   Lack of Transportation (Non-Medical): No  Physical Activity: Inactive   Days of Exercise per Week: 0 days   Minutes of Exercise per Session: 0 min  Stress: Stress Concern  Present   Feeling of Stress : To some extent  Social Connections: Moderately Isolated   Frequency of Communication with Friends and Family: Twice a week   Frequency of Social Gatherings with Friends and Family: Twice a week   Attends Religious Services: Never   Marine scientist or Organizations: No   Attends Music therapist: Never   Marital Status: Married  Human resources officer Violence: Not At Risk   Fear of Current or Ex-Partner: No   Emotionally Abused: No   Physically Abused: No   Sexually Abused: No     PHYSICAL EXAM   GENERAL EXAM/CONSTITUTIONAL: Vitals:  Vitals:   02/15/21 1035  BP: 109/70  Pulse: 72  Weight: 198 lb (89.8 kg)  Height: 5\' 8"  (1.727 m)   Body mass index is 30.11 kg/m. Wt Readings from Last 3 Encounters:  02/15/21 198 lb (89.8 kg)  02/08/21 197 lb (89.4 kg)  01/30/21 201 lb (91.2 kg)   Patient is in no distress; well developed, nourished and groomed; neck is supple  CARDIOVASCULAR: Examination of peripheral vascular system by observation and palpation is normal  EYES: Pupils round and reactive to light, Visual fields full to confrontation, Extraocular movements intacts,   MUSCULOSKELETAL: Gait, strength, tone, movements noted in Neurologic exam below  NEUROLOGIC: MENTAL STATUS:  MMSE - [place] Exam 02/15/2021 01/15/2021 02/04/2020  Orientation to time 2 4 5   Orientation to Place 3 5 5   Registration 3 3 3   Attention/ Calculation 1 2 3   Recall 1 0 0  Language- name 2 objects 2 2 2   Language- repeat 1 1 1   Language- follow 3 step command 1 3 3   Language- read & follow direction 1 1 0  Write a sentence 0 1 0  Copy design 0 0 1  Total score 15 22 23     CRANIAL NERVE:  2nd, 3rd, 4th, 6th - pupils equal and reactive to light, visual fields full to confrontation, extraocular muscles intact, no nystagmus 5th - facial sensation symmetric 7th - facial strength symmetric 8th - hearing intact 9th - palate elevates  symmetrically, uvula midline 11th - shoulder shrug symmetric 12th - tongue protrusion midline  MOTOR:  normal bulk and tone, no cogwheeling, full strength in the BUE, BLE  SENSORY:  normal and symmetric to light touch all 4 extremities  COORDINATION:  finger-nose-finger, fine finger movements normal, no tremor  REFLEXES:  deep tendon reflexes present and symmetric  GAIT/STATION:  Slow, shuffling gait. Walks with aid of cane     DIAGNOSTIC DATA (LABS, IMAGING, TESTING) - I reviewed patient records, labs, notes, testing and imaging myself where available.  Lab Results  Component Value Date   WBC 8.6 10/13/2020   HGB 15.4 10/13/2020   HCT 46.8 10/13/2020   MCV 98 (H) 10/13/2020   PLT 146 (L) 10/13/2020      Component Value Date/Time   NA 134 01/15/2021 0945   K 4.1 01/15/2021 0945   CL 93 (L) 01/15/2021 0945   CO2 22 01/15/2021 0945   GLUCOSE 263 (H) 01/15/2021 0945   GLUCOSE 168 (H) 09/07/2019 0705   BUN 22  01/15/2021 0945   CREATININE 1.67 (H) 01/15/2021 0945   CALCIUM 9.3 01/15/2021 0945   PROT 7.3 10/13/2020 1055   ALBUMIN 4.5 01/15/2021 0945   AST 16 10/13/2020 1055   ALT 16 10/13/2020 1055   ALKPHOS 68 10/13/2020 1055   BILITOT 0.5 10/13/2020 1055   GFRNONAA 38 (L) 05/30/2020 1005   GFRAA 44 (L) 05/30/2020 1005   Lab Results  Component Value Date   CHOL 146 10/13/2020   HDL 35 (L) 10/13/2020   LDLCALC 58 10/13/2020   TRIG 340 (H) 10/13/2020   CHOLHDL 4.2 10/13/2020   Lab Results  Component Value Date   HGBA1C 6.7 (H) 01/15/2021   No results found for: VITAMINB12 Lab Results  Component Value Date   TSH 1.630 10/13/2020       ASSESSMENT AND PLAN  80 y.o. year old male with a history of DM, HLD, HTN, AAA s/p repair, aortic stenosis s/p TAVR, CHF, MI x3, s/p defibrillator implant, anxiety, depression who presents for evaluation of progressive memory loss over the past couple of years. MMSE last month and today are suggestive of mild-moderate  memory issues. [place] in 2021 with generalized brain atrophy. He can not get an MRI due to pacemaker.  Discussed how COVID can cause persistent memory issues and may have partially contributed to his symptoms, but progressive memory changes do raise concern for early dementia. Will start donepezil as his ADLs are starting to become impacted by memory loss. Discussed how untreated OSA can contribute to memory issues and offered Sleep referral, but he is not interested in pursuing this at this time. Offered cognitive rehab and PT referral for balance, which he declined at this time. Information on fall precautions provided.   1. Memory loss       PLAN: - Labs: vitamin B12 - Start donepezil 5 mg daily for 4 weeks, then increase to 10 mg daily - Fall precaution information provided - Discussed driving safety. He has never had an accident and will only drive short distances with his wife in the car. Will re-evaluate driving safety every 6-12 months while he is driving.  Orders Placed This Encounter  Procedures   Vitamin B12    Meds ordered this encounter  Medications   donepezil (ARICEPT) 5 MG tablet    Sig: Take 5 mg (1 pill) daily for 4 weeks, then increase to 10 mg (2 pills) daily    Dispense:  60 tablet    Refill:  2    Return in about 1 year (around 02/15/2022).  I spent an average of 50 minutes chart reviewing and counseling the patient, with at least 50% of the time face to face with the patient. General brain health measures discussed, including the importance of regular aerobic exercise. Reviewed safety measures including driving safety.   Genia Harold, MD 02/15/21 12:03 PM   [facility] [address] [place], [place] [address] [phone]

## 2021-02-15 NOTE — Telephone Encounter (Signed)
Donepezil PA, key [id number]. Your information has been sent to [facility].

## 2021-02-15 NOTE — Patient Instructions (Addendum)
Blood work - vitamin B12 level Start Donepezil (Aricept) 5mg  tab once a day for the first 4 weeks, then increase the dose to 10mg  once per day. It is better to take Donepezil with breakfast or lunch. Aricept is well tolerated, although some people may experience nausea, diarrhea, not sleeping well, vomiting, muscle cramps, feeling tired, or not wanting to eat. These side effects were usually mild and temporary. If symptoms continue or become severe, you should stop the medication and contact us.           Tasks to improve attention/working memory 1. Good sleep hygiene (7-8 hrs of sleep) 2. Learning a new skill (Painting, Carpentry, Pottery, new language, Knitting). 3.Cognitive exercises (keep a daily journal, Puzzles) 4. Physical exercise and training  (30 min/day X 4 days week) 5. Being on Antidepressant if needed 6.Yoga, Meditation, Tai Chi 7. Decrease alcohol intake 8.Have a clear schedule and structure in daily routine  DEMENTIA OVERVIEW "Dementia" is a general term for when a person has developed difficulties with reasoning, judgment, and memory. People who have dementia usually have some memory loss as well as difficulty in at least one other area, such as: ?Speaking or writing coherently (or understanding what is said or written) ?Recognizing familiar surroundings ?Planning and carrying out complex or multi-step tasks In order to be considered dementia these issues must be severe enough to interfere with a person's independence and daily activities. Dementia can be caused by several diseases that affect the brain. The most common cause is Alzheimer disease. Alzheimer disease is present in approximately 38 to 76 percent of all cases of dementia; other degenerative and/or vascular diseases may be present as well, particularly as a person gets older.   DEMENTIA RISK FACTORS There is no way to predict with certainty who will develop dementia. Each form of dementia has its own risk factors,  but most forms have several risk factors in common. Age -- The biggest risk factor for dementia is age: dementia is rare in people younger than 99 years and becomes very common in people older than 9. For example, dementia affects approximately one in six people between 71 and 68 years old, one in three above 24 years, and almost half of people over age 41.  Family history -- Some forms of dementia have a genetic component, meaning that they tend to run in families. Having a close family member with Alzheimer disease increases your chances of developing it. People with a first-degree relative (parent or sibling) with Alzheimer disease have a greater chance of developing the disorder. The risk is probably highest if the family member developed Alzheimer disease at a younger age (less than 80 years old) and is lower if the family member did not get Alzheimer disease until late in life. However, families that have a very strong genetic tendency toward Alzheimer disease are uncommon.  Other factors -- Studies indicate that high blood pressure, smoking, and diabetes may be risk factors for dementia. Experts are still not sure how treatment for these problems might influence your risk of developing dementia beyond their benefit of reducing stroke risk. Lifestyle factors have also been implicated in dementia. For instance, people who remain physically active, socially connected, and mentally engaged seem less likely to develop dementia than people who do not. These activities may produce more cognitive (mental) reserve or resilience, delaying the emergence of symptoms until an older age.  DEMENTIA SYMPTOMS Each form of dementia can cause difficulty with memory, language, reasoning, and judgment, but  the symptoms are often very different from person to person. Symptoms also change over time. The differences between one form of dementia and another may only be recognizable to skilled health care providers who have  experience working with people with dementia. Sometimes family members notice changes but mistakenly attribute them to aging.  Is memory loss normal? -- Many people worry that memory problems are related to early Alzheimer disease. However, some problems are normal and just related to aging, and do not signify a progressive dementia. Normal age-related changes often cause minor difficulties with immediate memory, for example, remembering a phone number or a set of directions for a short time. Temporary difficulty recalling proper names, even very familiar ones, is also common with aging. As people age normally, it is common to complain of less efficient and slower processing and learning of new information. Memory changes due to normal aging are usually mild and do not worsen greatly over time, nor should they interfere with a person's day-to-day functioning.  Early changes -- The earliest symptoms of Alzheimer disease are gradual and often subtle. Many people and their families first notice difficulty recalling recent events or information. This often emerges as a tendency to repeat stories or questions or to request or require repetition of material to be able to remember. If you find yourself telling an older family member or friend "I told you that earlier" or "You have told me that more than once," you might begin to suspect Alzheimer disease. Other changes can include one or more of the following: ?Difficulties with language (eg, not being able to find the right words for things) ?Difficulty with concentration and reasoning ?Problems with complex tasks like paying bills, cooking, or balancing a checkbook ?Getting lost in a familiar place  Late changes -- As Alzheimer disease progresses, a person's ability to think clearly continues to decline, and any or all of the changes listed above may be more disruptive. In addition, personality and behavioral symptoms can become quite troublesome. These can  include: ?Increased anger or hostility, sometimes aggressive behavior; alternatively, some people become depressed or exhibit little interest in their surroundings (called "apathy") ?Sleep problems ?Hallucinations and/or delusions ?Disorientation ?Needing help with basic tasks (such as eating, bathing, and dressing) ?Incontinence (difficulty controlling the bladder and/or bowels)  The number of symptoms, the functions that are impaired, and the speed with which symptoms progress can vary widely from one person to the next. In some people, severe dementia occurs within five years of the diagnosis; for others, the progression can take more than 10 years. Most people with Alzheimer disease do not die from the disease itself, but rather from a secondary illness such as pneumonia, bladder infection, or complications of a fall.  DEMENTIA DIAGNOSIS To diagnose dementia and identify the type of dementia, health care providers typically rely on the information they can gather by interacting with the person and speaking with their family members. The provider will typically perform memory and other cognitive (thinking) tests to assess the person's degree of difficulty with different types of problems. The results of these tests can then be monitored over time to observe whether functioning stays the same or declines.  Blood tests are usually done to find out if a chemical or hormonal imbalance or vitamin deficiency is contributing to the person's difficulties. Brain scans (usually MRI) are often performed in people with dementia to look for other problems. Sometimes the MRI can also help health care providers identify the type of dementia, since  different types can have characteristic brain changes.  SAFETY AND LIFESTYLE ISSUES FOR PEOPLE WITH DEMENTIA A major issue for caregivers is making sure the person with dementia stays safe. Because many people with dementia do not realize that their mental functioning  is impaired, they try to continue their day-to-day activities as usual. This can lead to physical danger, and caregivers must help to avoid situations that can threaten the safety of the person or others. The following information applies specifically to people with Alzheimer disease, but much of it is also relevant to people with other forms of dementia.  Medications -- People with Alzheimer disease often have trouble remembering to take medications they are prescribed for other conditions, or they become confused about which medications to take. They are also at increased risk for potentially dangerous side effects from certain medications. Sedatives and certain other drugs (such as some antihistamines and antidepressants) may carry risk of increasing cognitive impairment. It is important to develop a plan for medication monitoring and safety. People with dementia often need help taking their medications. It's a good idea to throw away old pill bottles and other medications that are no longer needed.  Driving -- Driving is often one of the first safety issues that arises in people with Alzheimer disease. In people with Alzheimer disease, the risk of having a car accident is significantly increased, especially as the disease progresses. It is best to discuss the issue of driving early, before the symptoms become advanced. Over time, everyone living long enough with dementia will reach a point where driving is too dangerous. Losing the ability to drive can be hard to accept because it represents independence for many people. It can also be challenging if the person does not completely appreciate their impairments in mental functioning or reaction time. In particular, driving at night may carry extra risk. Many people with mild but worrisome impairments will insist that they can safely drive locally or in the daytime. That may be true for the present time; however, people may forget that they have agreed to  limitations. In addition, their ability to drive safely, even with restrictions, will deteriorate over time. A roadside driving test is often recommended if there is disagreement or uncertainty about a person's ability to drive. However, if a person with newly diagnosed, mild Alzheimer disease is deemed still able to drive, they will need to be reassessed every six months, with the understanding that driving will eventually no longer be possible. There may be important insurance implications of continued driving when medical records document advice to stop driving.  Cooking -- Cooking is another area that can lead to serious safety concerns and may require help or supervision. Symptoms such as distractibility, forgetfulness, and difficulty following directions can lead to burns, fires, or other injuries. The use of gas cooking appliance raises a particular concern. A family member may have to ask the utility company to disconnect gas stoves if there is potential for accident or injury. Newer induction electric stoves do not change color when on, and may carry an inadvertent burn risk if a person forgets what they are doing.  Wandering -- As dementia progresses, some people with Alzheimer disease begin to wander. Because restlessness, distractibility, and memory problems are common, a person who wanders may easily become lost. Identification bracelets can help ensure that a lost wanderer gets home. The [employer] provides a "Wandering Support" program that provides ID tags and 24-hour assistance to patients who are preregistered for this program.  There are many "locator" applications that allow the person with dementia to wear or carry a GPS device that a family member can track with their cell phone. Regular exercise may decrease the restlessness that can lead to wandering. Exercise is also just good practice to maintain strength, good sleep, and overall health. If wandering continues, wearable  alarm systems are available that alert caretakers when the person leaves the home.  Falls -- Falls with secondary injuries are one of the most important causes of additional disability in people with all types of dementia, including Alzheimer disease. Commonly used medications can increase risk of falls and injuries. Hip fracture is a particular concern in older people, as it can lead to serious complications and sometimes even death. To reduce the risk of falls, potential tripping hazards such as loose electrical cords, slippery rugs, and clutter should be removed. Inadvertent hoarding may develop and pose a safety risk. If the person lives alone, family members or elder services should perform safety inspections of the living space periodically. Medication lists should also be reviewed with your doctor to identify those that might increase the risk of falling. Regular exercise, especially early in the course of dementia, and use of assistive devices like canes can also help with balance.  DEMENTIA TREATMENT Medications for Alzheimer disease -- Many people with Alzheimer disease will have the option of trying a medication. A trial of medication is usually begun for a period of a few to several weeks while the person is monitored for side effects and response. A health care provider should periodically review all medications to see if they are providing any benefit. It is important to have realistic expectations about the potential benefits of medication therapy in Alzheimer disease. None of these medications cure the disease, and the reality is that over time the person will continue to worsen. When medication does have an effect, the goal is not to stop progression of the disease, but to improve quality of life for the person and their family to the extent possible.  Treatment to slow or delay progression -- There is currently no cure for Alzheimer disease. However, experts are studying treatments in the  hope of finding a way to slow the progression of the mental and functional decline, along with scientific efforts to prevent or delay onset.  Treatment of memory problems -- There are several medicines currently available for treating the memory problems associated with Alzheimer disease; they are also used in people with other forms of dementia.  ?Donepezil (brand name: Aricept) This medications allow more of a chemical called acetylcholine to be active in the brain, making up for drops in acetylcholine levels that happen in Alzheimer disease. It can cause side effects such as nausea, vomiting, and diarrhea in some people. It also may cause weight loss in many people. When taken at bedtime, cholinesterase inhibitors can cause very vivid dreams. If there is no improvement in symptoms or side effects are bothersome, the medication should be stopped. Sometimes the person's symptoms will worsen after treatment is stopped; if this happens, the medication may be started again.  Treatment of behavioral symptoms -- The behavioral symptoms of Alzheimer disease are often more troubling than the cognitive (mental) symptoms. Even in mild cases, agitation, anxiety, and irritability can occur, and generally worsen as Alzheimer disease advances. This can be stressful for the person as well as for their family and caregivers. A combination of medications and behavioral therapy may be helpful. Non-medication therapies are preferred, as  virtually all medications used for behavioral symptoms can increase confusion and many are associated with serious side effects and even an increased risk of death.  Depression -- Depression is common, especially in the early phases of dementia. It may be treated with behavioral therapy and/or with medications. The key is to recognize that depression may be playing a role in the person's symptoms. If depression is causing distress, it is worth treating. Potentially helpful medicines include  a group of medicines known as selective serotonin reuptake inhibitors, or SSRIs, which are usually preferred over other choices in patients with dementia. Widely used SSRIs include fluoxetine (brand name: Prozac), sertraline (brand name: Zoloft), paroxetine (sample brand names: Brisdelle, Paxil), citalopram (brand name: Celexa), and escitalopram (brand names: Lexapro, Cipralex).   A variety of behavioral therapies are often helpful, do not have the side effects often seen with medications, and may be recommended for depression. Behavioral therapy involves changing the person's environment (eg, regular exercise, avoiding triggers that cause sadness, socializing with others, engaging in pleasant activities that a person enjoys).  Agitation and aggression -- One of the most difficult issues for caregivers and people with Alzheimer disease is aggressive behavior. Fortunately, this behavior is not common. However, many family members are reluctant to report aggressive behavior. In some cases, the behavior becomes physically abusive as dementia progresses. Agitation and aggression can be caused by a number of factors, including: ?Confusion, misunderstanding, or disorientation (doctors use the term "delirium" as a general term to describe a state of confusion in which a person does not think or behave normally) ?Frightening or paranoid delusions or hallucinations ?Depression or anxiety ?Sleep disorders, such as reduced sleep or altered sleep/wake cycles ?Certain medical conditions that can cause delirium, such as urinary tract infection or pneumonia ?Being in physical pain or discomfort ?Side effects of certain medications Delusions (ie, believing something that is not real or true) are common in patients with dementia, occurring in up to 30 percent of those with advanced disease. Paranoid delusions are particularly distressing to both the patients and the caregivers: these often include beliefs that someone  has invaded the house, that family members have been replaced by impostors, that spouses have been unfaithful, or that personal possessions have been stolen.   Family members should discuss any concerns about aggressive behavior with a health care provider and arrange for help if necessary. The best treatment for these symptoms depends upon what triggers them. As an example, a person who becomes aggressive during periods of confusion might best be treated by talking through the problem, while someone who becomes aggressive during delusions might require medication. Often, behavior improves once an underlying medical condition is treated. Caregivers can learn strategies to help lessen the number of triggers and confrontations.   Sleep problems -- Sleep disorders can be treated with either medicine or behavior changes or both: for example, limiting daytime naps, increasing physical activity, avoiding caffeine and alcohol in the evening. In some people, medication to help with sleep may be recommended, although these medications almost always have side effects (eg, worsened confusion and increased risk of falls). Maintaining daily rhythms, using artificial lighting when needed during the day, and avoiding bright light exposures during the night may help maintain normal wake-sleep cycles.  COPING WITH DEMENTIA Being diagnosed with any form of dementia can be distressing and overwhelming for the person affected as well as their loved ones. For people with dementia -- It is important for people with early dementia to care for their physical  and mental health. This means getting regular checkups, taking medicines if needed, eating a healthy diet, exercising regularly, getting enough sleep, and avoiding activities that may be risky. It is often helpful to talk to others through support groups or a counselor or social worker to discuss any feelings of anxiety, frustration, anger, loneliness, or depression. All of  these feelings are normal, and dealing with these feelings can help you to feel more in control of your life and health. It can also help to talk to other people who are going through a similar experience. Another issue to consider is how to tell your family and friends about your diagnosis. Explaining the disease can help others to understand what to expect and how they can help, now and in the future. This can be especially helpful for children and grandchildren, who may not be familiar with the condition. While many people are able to live alone in the early stages of dementia, you may need help with tasks such as housekeeping, cooking, transportation, and paying bills. If possible, ask a friend or family member for help making plans to deal with these and other issues as dementia progresses. Occupational therapists, and sometimes speech pathologists, can help to set up your home to minimize confusion and keep you independent for as long as possible. It's also important to establish Power of Attorney and Health Care Proxy statuses early, before a financial or health care crisis happens. This involves completing paperwork to determine who can make decisions on your behalf if needed. In addition, you should discuss your preferences regarding issues that are likely to become important as your dementia worsens, including: ?Is health insurance available, and what does it cover? ?Where will I live? ?Who will make health care and end-of-life decisions if I can't make them for myself? ?Who will pay for care? A number of resources are available to assist in this type of planning.   For caregivers -- Dementia can also impose an enormous burden on families and other caregivers. People with dementia become less able to care for themselves as the condition progresses. If you are caring for someone with dementia, the following may help: ?Make a daily plan and prepare to be flexible if needed. ?Try to be patient  when responding to repetitive questions, behaviors, or statements. ?Try not to argue or confront the person with dementia when they express mistaken ideas or facts. Change the subject or gently remind the person of an inaccuracy. Arguing or trying to convince a person of "the truth" is a natural reaction but it can be frustrating to all and can trigger unwanted behavior and feelings. ?Use memory aids such as writing out a list of daily activities, phone numbers, and instructions for usual tasks (ie, the telephone, microwave, etc). It may help if these are posted and easily visible so that the person need not remember to look for the aids. ?Establish calm and consistent nighttime routines to manage behavioral problems, which are often worst at night. Leave a night light on in the person's bedroom. ?Avoid major changes to the home environment (for example, rearranging furniture). ?Employ safety measures in the home, such as putting locks on medicine cabinets, keeping furniture in the same place to prevent falls, reducing clutter, removing electrical appliances from the bathroom, installing grab bars in the bathroom, and setting the water heater below 120F. ?Help the person with personal care tasks as needed. It is not necessary to bathe every day, although a health care provider should be  notified if the person develops sores in the mouth or genitals related to hygiene problems (eg, ill-fitting dentures, urine leakage). ?Speak slowly, present only one idea at a time, and be patient when waiting for responses. ?Encourage physical activity and exercise. Even a daily walk can help prevent physical decline and improve behavioral problems. ?Consider respite care. Respite care can provide a needed break and give you a chance to recharge. This is offered in many communities in the form of in-home care or adult day care. Caregiving can be an all-consuming experience, and it's essential to take time for yourself,  take care of your own medical problems, and arrange for breaks when you need them. ?See if your area has a support group for people caring for loved ones with dementia. It can help to talk with other people who understand what you are going through.    Preventing Falls at [facility] are common, often dreaded events in the lives of older people. Aside from the obvious injuries and even death that may result, fall can cause wide-ranging consequences including loss of independence, mental decline, decreased activity and mobility. Younger people are also at risk of falling, especially those with chronic illnesses and fatigue.  Ways to reduce risk for falling   Examine diet and medications. Warm foods and alcohol dilate blood vessels, which can lead to dizziness when standing. Sleep aids, antidepressants and pain medications can also increase the likelihood of a fall.   Get a vision exam. Poor vision, cataracts and glaucoma increase the chances of falling.   Check foot gear. Shoes should fit snugly and have a sturdy, nonskid sole and a broad, low heel   Participate in a physician-approved exercise program to build and maintain muscle strength and improve balance and coordination. Programs that use ankle weights or stretch bands are excellent for muscle-strengthening. Water aerobics programs and low-impact Tai Chi programs have also been shown to improve balance and coordination.   Increase vitamin D intake. Vitamin D improves muscle strength and increases the amount of calcium the body is able to absorb and deposit in bones.  How to prevent falls from common hazards   Floors -- Remove all loose wires, cords, and throw rugs. Minimize clutter. Make sure rugs are anchored and smooth. Keep furniture in its usual place.   Chairs -- Use chairs with straight backs, armrests and firm seats. Add firm cushions to existing pieces to add height.   Bathroom -- Install grab bars and non-skid tape in the tub or shower.  Use a bathtub transfer bench or a shower chair with a back support Use an elevated toilet seat and/or safety rails to assist standing from a low surface. Do not use towel racks or bathroom tissue holders to help you stand.   Lighting -- Make sure halls, stairways, and entrances are well-lit. Install a night light in your bathroom or hallway. Make sure there is a light switch at the top and bottom of the staircase. Turn lights on if you get up in the middle of the night. Make sure lamps or light switches are within reach of the bed if you have to get up during the night.   Kitchen -- Install non-skid rubber mats near the sink and stove. Clean spills immediately. Store frequently used utensils, pots, pans between waist and eye level. This helps prevent reaching and bending. Sit when getting things out of lower cupboards.   Living room / [place] furniture with wide spaces in between,  giving enough room to move around. Establish a route through the living room that gives you something to hold onto as you walk.   Stairs -- Make sure treads, rails, and rugs are secure. Install a rail on both sides of the stairs. If stairs are a threat, it might be helpful to arrange most of your activities on the lower level to reduce the number of times you must climb the stairs.   Entrances and doorways -- Install metal handles on the walls adjacent to the doorknobs of all doors to make it more secure as you travel through the doorway.   Tips for maintaining balance   Keep at least one hand free at all times. Try using a backpack or fanny pack to hold things rather than carrying them in your hands. Never carry objects in both hands when walking as this interferes with keeping your balance.   Attempt to swing both arms from front to back while walking. This might require a conscious effort if Parkinson's disease has diminished your movement. It will, however, help you to maintain balance and posture, and reduce fatigue.    Consciously lift your feet off of the ground when walking. Shuffling and dragging of the feet is a common culprit in losing your balance.   When trying to navigate turns, use a "U" technique of facing forward and making a wide turn, rather than pivoting sharply.   Try to stand with your feet shoulder-length apart. When your feet are close together for any length of time, you increase your risk of losing your balance and falling.   Do one thing at a time. Don't try to walk and accomplish another task, such as reading or looking around. The decrease in your automatic reflexes complicates motor function, so the less distraction, the better.   Do not wear rubber or gripping soled shoes, they might "catch" on the floor and cause tripping.   Move slowly when changing positions. Use deliberate, concentrated movements and, if needed, use a grab bar or walking aid. Count 15 seconds between each movement. For example, when rising from a seated position, wait 15 seconds after standing to begin walking.   If balance is a continuous problem, you might want to consider a walking aid such as a cane, walking stick, or walker. Once you've mastered walking with help, you might be ready to try it on your own again.

## 2021-02-16 LAB — VITAMIN B12: Vitamin B-12: 475 pg/mL (ref 232–1245)

## 2021-02-16 IMAGING — CT CT HEAD W/O CM
3 series · 14 of 47 positions shown, 16 images · non-contrast
Comparison: None.

CLINICAL DATA: Fall, posterior head strike

EXAM:
CT HEAD WITHOUT CONTRAST
CT CERVICAL SPINE WITHOUT CONTRAST
TECHNIQUE: Multidetector CT imaging of the head and cervical spine was
performed following the standard protocol without intravenous
contrast. Multiplanar CT image reconstructions of the cervical spine
were also generated.

[Series 2: head w o · axial · 0.45mm/px · z∈[+1296,+1421]mm · 8 of 30 slices shown, 10 images]
[im 3/30  brain]
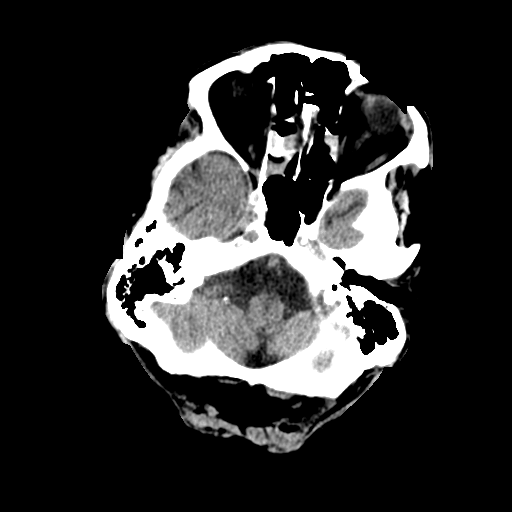
[im 3/30  bone]
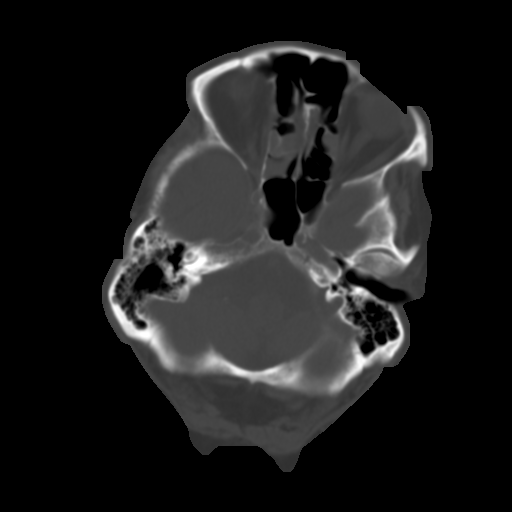
[im 7/30  brain]
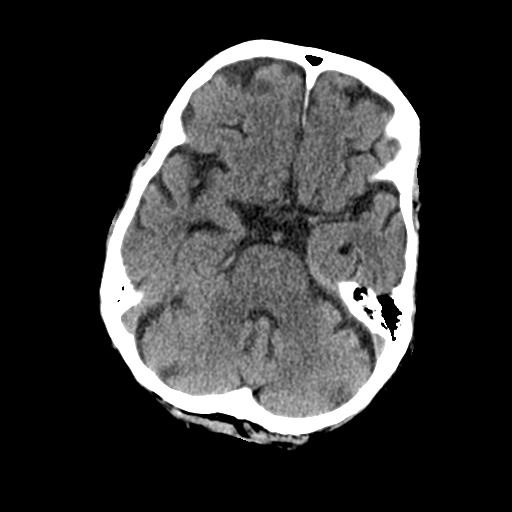
[im 10/30  brain]
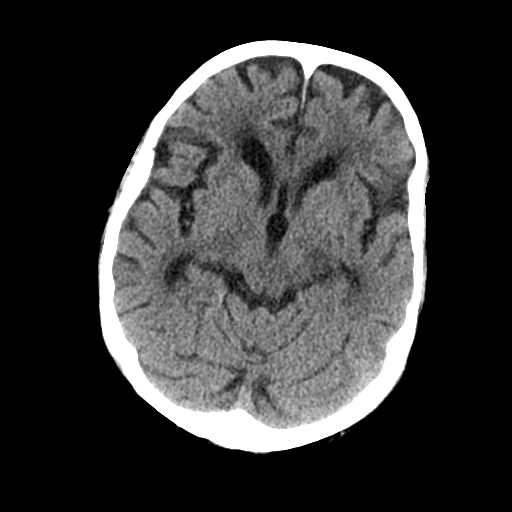
[im 14/30  brain]
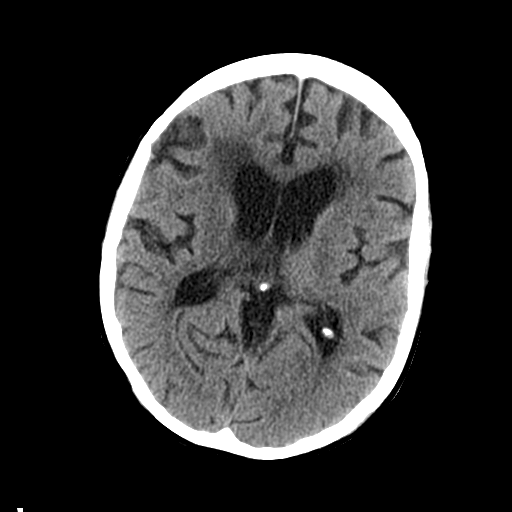
[im 17/30  brain]
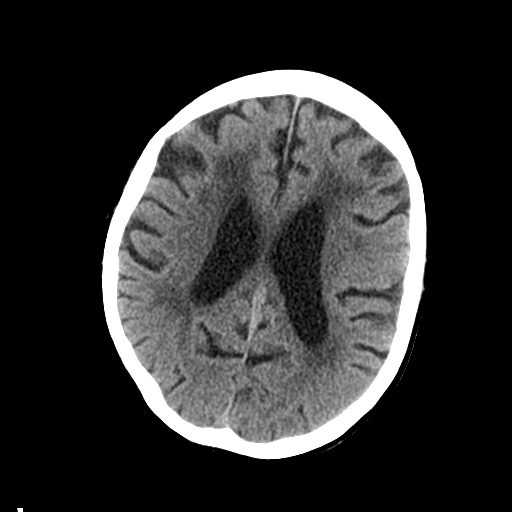
[im 17/30  bone]
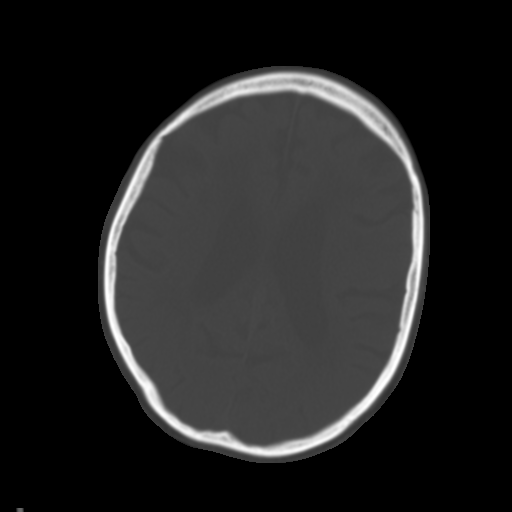
[im 21/30  brain]
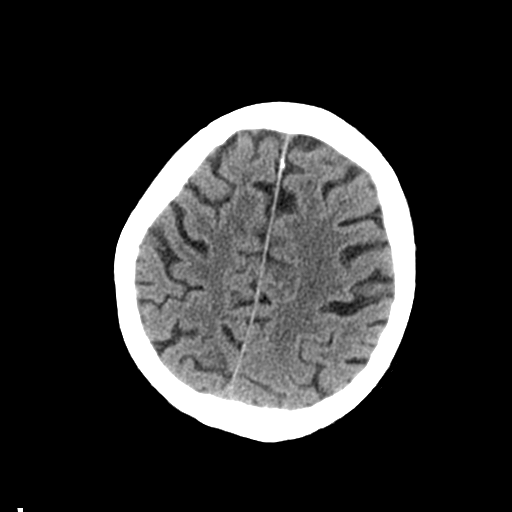
[im 24/30  brain]
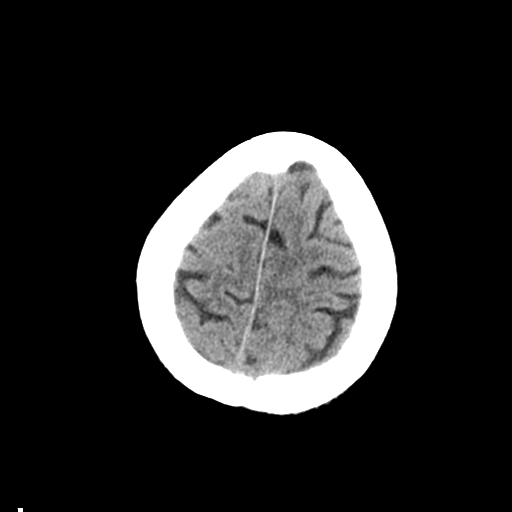
[im 28/30  brain]
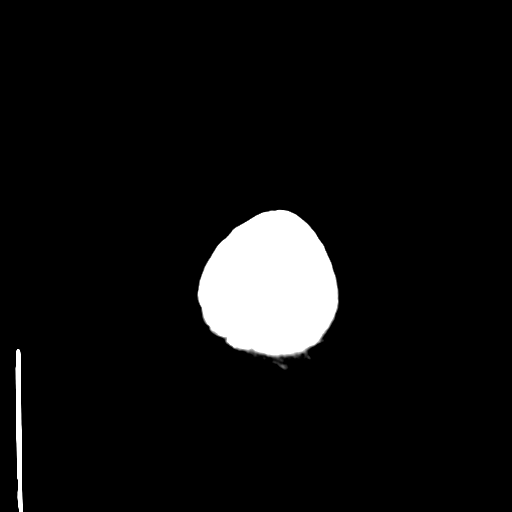

[Series 4: coronal soft · coronal · 0.34mm/px · 3 of 68 slices shown]
[im 23/68  brain]
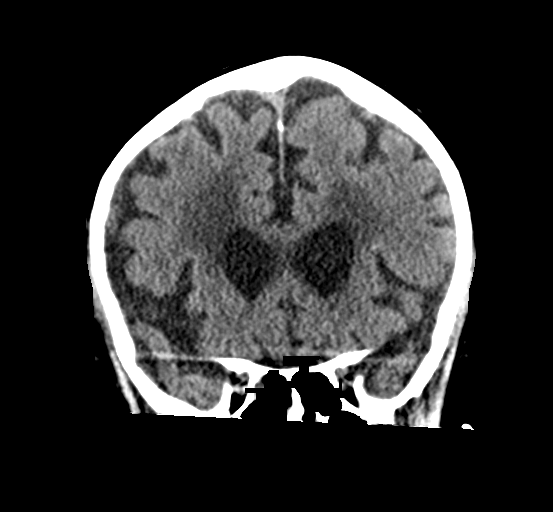
[im 30/68  brain]
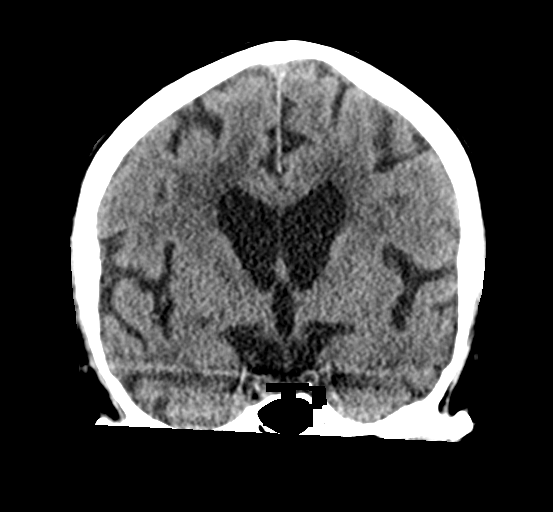
[im 38/68  brain]
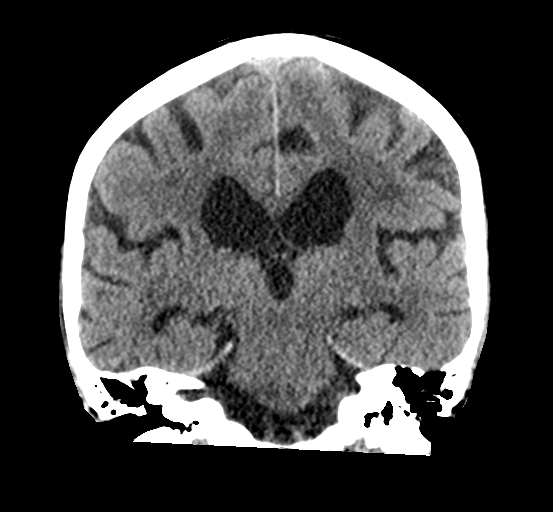

[Series 5: sagittal soft · sagittal · 0.33mm/px · 3 of 55 slices shown]
[im 19/55  brain]
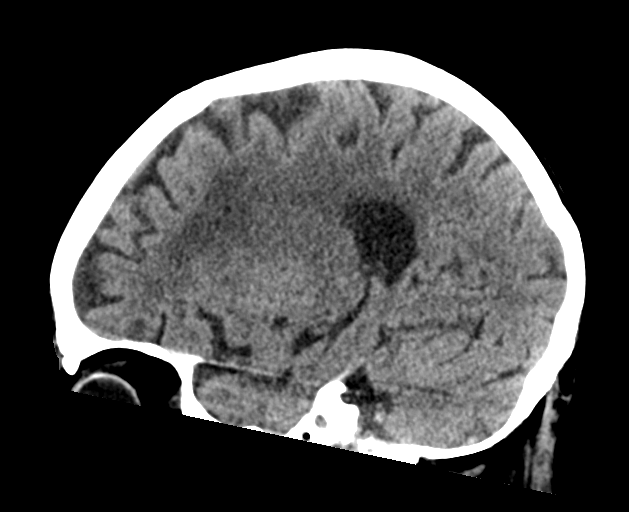
[im 28/55  brain]
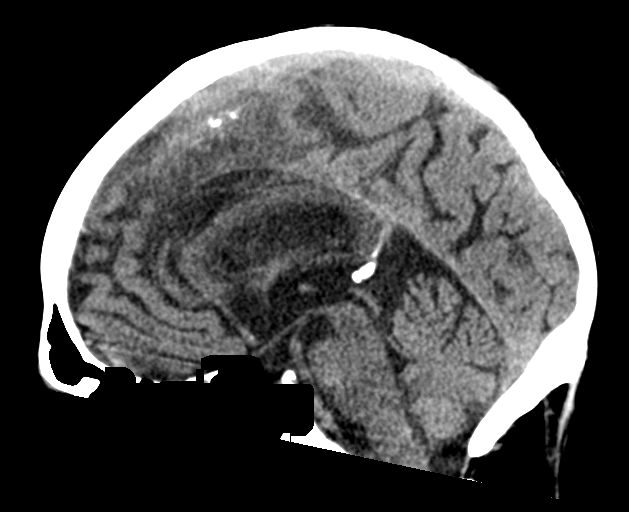
[im 37/55  brain]
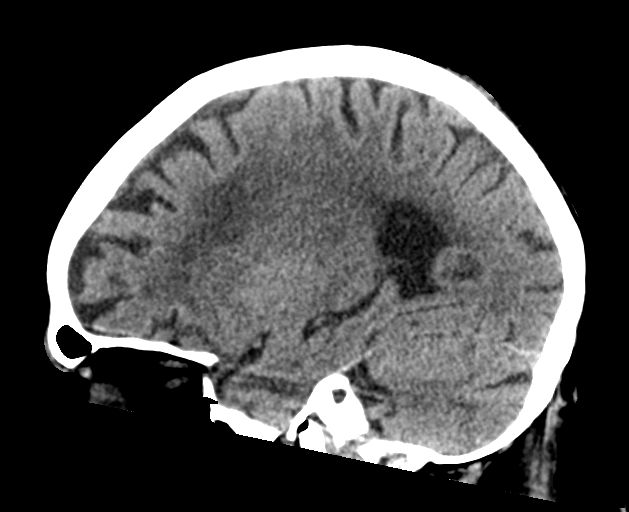

[14 of 47 positions shown; findings below may reference images not displayed]

FINDINGS: CT HEAD FINDINGS

Brain: No evidence of acute infarction, hemorrhage, hydrocephalus,
extra-axial collection or mass lesion/mass effect. Symmetric
prominence of the ventricles, cisterns and sulci compatible with
parenchymal volume loss. Patchy areas of white matter
hypoattenuation are most compatible with chronic microvascular
angiopathy.

Vascular: Atherosclerotic calcification of the carotid siphons. No
hyperdense vessel.

Skull: Left parietal and occipital scalp swelling and thickening
without subjacent calvarial fracture. No acute or suspicious osseous
lesions.

Sinuses/Orbits: Paranasal sinuses and mastoid air cells are
predominantly clear. Included orbital structures are unremarkable.

Other: None

CT CERVICAL SPINE FINDINGS

Alignment: None slight reversal the normal cervical lordosis. Mild
retrolisthesis C3 on 4 is on a likely degenerative basis given the
extent of spondylitic change at this level. No abnormally widened,
jumped or perched facets. Craniocervical and atlantoaxial
articulations are normally aligned accounting for mild leftward
rotation of the patient head.

Skull base and vertebrae: No acute fracture. No primary bone lesion
or focal pathologic process.

Soft tissues and spinal canal: No pre or paravertebral fluid or
swelling. No visible canal hematoma.

Disc levels: Multilevel intervertebral disc height loss with
spondylitic endplate changes most pronounced at C3-4 but without
significant canal stenosis. Additional uncinate hypertrophy and
facet hypertrophic changes at this level result in mild-to-moderate
bilateral foraminal narrowing.

Upper chest: No acute abnormality in the upper chest or imaged lung
apices.

Other: Normal thyroid. Cervical carotid atherosclerosis.
IMPRESSION: 1. Left parietal and occipital scalp swelling and thickening without
subjacent calvarial fracture.
2. No acute intracranial findings. Chronic microvascular angiopathy
and parenchymal volume loss.
3. No evidence of acute fracture or traumatic malalignment within
the cervical spine.
4. Multilevel spondylitic and facet degenerative changes most
pronounced at C3-4.
5. Cervical and intracranial atherosclerosis.

## 2021-02-19 ENCOUNTER — Other Ambulatory Visit: Payer: Self-pay | Admitting: Psychiatry

## 2021-02-19 ENCOUNTER — Telehealth: Payer: Self-pay

## 2021-02-19 DIAGNOSIS — G309 Alzheimer's disease, unspecified: Secondary | ICD-10-CM

## 2021-02-19 DIAGNOSIS — F0280 Dementia in other diseases classified elsewhere without behavioral disturbance: Secondary | ICD-10-CM

## 2021-02-19 MED ORDER — DONEPEZIL HCL 5 MG PO TABS: ORAL_TABLET | ORAL | 2 refills | Status: DC

## 2021-02-19 NOTE — Telephone Encounter (Addendum)
Donepezil denied. Plan limit is 1 tablet per day, unless diagnosis includes Alzheimer's dementia, mild impaired cognition or Alzheimer's disease prophylaxis. Donepezil 10 mg is under plan for up to 2 tablets per day. Will send to MD.

## 2021-02-19 NOTE — Telephone Encounter (Signed)
Unable to complete PA on CMM, called [facility] PA line, spoke with Juanda Crumble who stated appeal must be submitted. He will fax over appeal instructions.

## 2021-02-19 NOTE — Telephone Encounter (Signed)
Resubmitting PA on CMM with new diagnosis: G30.9, F02.80. Key: [id number], faxed Rx to be attached for review.

## 2021-02-19 NOTE — Telephone Encounter (Signed)
Appeal letter placed on MD desk for review, signature.

## 2021-02-19 NOTE — Telephone Encounter (Signed)
Prescription diagnosis updated to include Alzheimer's dementia, thanks

## 2021-02-19 NOTE — Telephone Encounter (Signed)
-----   Message from Genia Harold, MD sent at 02/19/2021  8:27 AM EST ----- B12 level is normal

## 2021-02-19 NOTE — Telephone Encounter (Signed)
Contacted pt, LVM per DPR informing him labs and B 12 levels are normal. Advised to call the office back with questions.

## 2021-02-21 ENCOUNTER — Encounter: Payer: Self-pay | Admitting: Family Medicine

## 2021-02-21 ENCOUNTER — Ambulatory Visit (INDEPENDENT_AMBULATORY_CARE_PROVIDER_SITE_OTHER): Payer: Medicare Other | Admitting: Family Medicine

## 2021-02-21 DIAGNOSIS — K529 Noninfective gastroenteritis and colitis, unspecified: Secondary | ICD-10-CM

## 2021-02-21 MED ORDER — ONDANSETRON 4 MG PO TBDP: 4.0000 mg | ORAL_TABLET | Freq: Three times a day (TID) | ORAL | 1 refills | Status: DC | PRN

## 2021-02-21 NOTE — Telephone Encounter (Signed)
Donepezil appeal letter signed and faxed to [facility].

## 2021-02-21 NOTE — Progress Notes (Signed)
Virtual Visit via telephone Note  I connected with Matthew Campos on 02/21/21 at 1710 by telephone and verified that I am speaking with the correct person using two identifiers. Matthew Campos is currently located at home and patient are currently with her during visit. The provider, Fransisca Kaufmann Uri Covey, MD is located in their office at time of visit.  Call ended at 1716  I discussed the limitations, risks, security and privacy concerns of performing an evaluation and management service by telephone and the availability of in person appointments. I also discussed with the patient that there may be a patient responsible charge related to this service. The patient expressed understanding and agreed to proceed.   History and Present Illness: Patient was sick to his stomach yesterday and took a pill from her wife and it started feeling better today. He was not able to eat yesterday but is eating today and is not nauseated. He had diarrhea.  He took zofran.  His diarrhea is better today. He did not eat something different. He denies blood in stool  1. Gastroenteritis     Outpatient Encounter Medications as of 02/21/2021  Medication Sig   acetaZOLAMIDE (DIAMOX) 250 MG tablet Take by mouth.   albuterol (PROVENTIL) (2.5 MG/3ML) 0.083% nebulizer solution Take 2.5 mg by nebulization every 6 (six) hours as needed for wheezing or shortness of breath.   albuterol (VENTOLIN HFA) 108 (90 Base) MCG/ACT inhaler INHALE 2 PUFFS INTO THE LUNGS EVERY 6 HOURS AS NEEDED FOR WHEEZING OR SHORTNESS OF BREATH   aspirin EC 81 MG tablet Take 81 mg by mouth daily.   calcium carbonate (OSCAL) 1500 (600 Ca) MG TABS tablet Take by mouth 2 (two) times daily with a meal.   clopidogrel (PLAVIX) 75 MG tablet TAKE ONE (1) TABLET EACH DAY   dapagliflozin propanediol (FARXIGA) 10 MG TABS tablet TAKE ONE TABLET EACH MORNING BEFORE BREAKFAST   dextromethorphan-guaiFENesin (MUCINEX DM) 30-600 MG 12hr tablet Take 1 tablet by  mouth 2 (two) times daily.   donepezil (ARICEPT) 5 MG tablet Take 5 mg (1 pill) daily for 4 weeks, then increase to 10 mg (2 pills) daily   ferrous sulfate 325 (65 FE) MG EC tablet Take 325 mg by mouth every Monday, Wednesday, and Friday.    glipiZIDE (GLUCOTROL) 5 MG tablet Take 1 tablet (5 mg total) by mouth daily before breakfast.   levothyroxine (SYNTHROID) 100 MCG tablet TAKE ONE (1) TABLET EACH DAY   lubiprostone (AMITIZA) 24 MCG capsule TAKE 1 CAPSULE TWICE A DAY WITH MEALS.   mirtazapine (REMERON) 7.5 MG tablet Take 1 tablet (7.5 mg total) by mouth at bedtime. STOP LEXAPRO   Multiple Vitamin (MULTIVITAMIN WITH MINERALS) TABS Take 1 tablet by mouth daily.   nitroGLYCERIN (NITROSTAT) 0.4 MG SL tablet Place 1 tablet (0.4 mg total) under the tongue every 5 (five) minutes as needed. For chest pain   ondansetron (ZOFRAN-ODT) 4 MG disintegrating tablet Take 1 tablet (4 mg total) by mouth every 8 (eight) hours as needed for nausea or vomiting.   oxyCODONE (ROXICODONE) 5 MG immediate release tablet Take 1 tablet (5 mg total) by mouth every 4 (four) hours as needed for severe pain or breakthrough pain.   pantoprazole (PROTONIX) 40 MG tablet TAKE ONE (1) TABLET EACH DAY   rosuvastatin (CRESTOR) 10 MG tablet TAKE ONE (1) TABLET EACH DAY   Semaglutide (RYBELSUS) 7 MG TABS Take 7 mg by mouth daily.   sodium chloride (OCEAN) 0.65 % nasal spray Place  into the nose.   spironolactone (ALDACTONE) 25 MG tablet Take 25 mg by mouth daily.    SYMBICORT 80-4.5 MCG/ACT inhaler Inhale 2 puffs into the lungs 2 (two) times daily as needed.   torsemide (DEMADEX) 20 MG tablet TAKE TWO TABLETS BY MOUTH TWICE DAILY   traZODone (DESYREL) 50 MG tablet TAKE 1/2 TO 1 TABLET AT BEDTIME AS NEEDED FOR SLEEP   [DISCONTINUED] ondansetron (ZOFRAN-ODT) 4 MG disintegrating tablet TAKE 1 TABLET EVERY 8 HOURS AS NEEDED FOR NAUSEA AND VOMITING   No facility-administered encounter medications on file as of 02/21/2021.    Review of  Systems  Constitutional:  Negative for chills and fever.  HENT:  Negative for ear pain and tinnitus.   Respiratory:  Negative for cough, shortness of breath and wheezing.   Cardiovascular:  Negative for chest pain, palpitations and leg swelling.  Gastrointestinal:  Positive for abdominal pain, diarrhea and nausea. Negative for blood in stool, constipation and vomiting.  Genitourinary:  Negative for decreased urine volume, difficulty urinating, dysuria and hematuria.  Musculoskeletal:  Negative for back pain and myalgias.  Skin:  Negative for rash.  Neurological:  Negative for dizziness, weakness and headaches.  Psychiatric/Behavioral:  Negative for suicidal ideas.    Observations/Objective: Patient sounds comfortable and in no acute distress  Assessment and Plan: Problem List Items Addressed This Visit   None Visit Diagnoses     Gastroenteritis    -  Primary       Will give Zofran to help with he gets any further symptoms but it sounds like he is doing better and already improved, he will call back if anything worsens. Follow up plan: Return if symptoms worsen or fail to improve.     I discussed the assessment and treatment plan with the patient. The patient was provided an opportunity to ask questions and all were answered. The patient agreed with the plan and demonstrated an understanding of the instructions.   The patient was advised to call back or seek an in-person evaluation if the symptoms worsen or if the condition fails to improve as anticipated.  The above assessment and management plan was discussed with the patient. The patient verbalized understanding of and has agreed to the management plan. Patient is aware to call the clinic if symptoms persist or worsen. Patient is aware when to return to the clinic for a follow-up visit. Patient educated on when it is appropriate to go to the emergency department.    I provided 6 minutes of non-face-to-face time during this  encounter.    Worthy Rancher, MD

## 2021-02-22 ENCOUNTER — Other Ambulatory Visit: Payer: Self-pay

## 2021-02-22 ENCOUNTER — Ambulatory Visit (INDEPENDENT_AMBULATORY_CARE_PROVIDER_SITE_OTHER): Payer: Medicare Other | Admitting: General Surgery

## 2021-02-22 ENCOUNTER — Encounter: Payer: Self-pay | Admitting: General Surgery

## 2021-02-22 VITALS — BP 118/76 | HR 89 | Temp 98.2°F | Resp 16 | Ht 68.0 in | Wt 199.0 lb

## 2021-02-22 DIAGNOSIS — L723 Sebaceous cyst: Secondary | ICD-10-CM

## 2021-02-22 DIAGNOSIS — L089 Local infection of the skin and subcutaneous tissue, unspecified: Secondary | ICD-10-CM

## 2021-02-22 NOTE — Progress Notes (Signed)
[facility]  Area healing. Scab on area.  BP 118/76   Pulse 89   Temp 98.2 F (36.8 C) (Other (Comment))   Resp 16   Ht 5\' 8"  (1.727 m)   Wt 199 lb (90.3 kg)   SpO2 92%   BMI 30.26 kg/m  Scab removed, nothing expressed from underneath, no signs of obvious cyst wall  Patient s/p cyst excision while infected, healing. Neosporin and bandaid placed Ok to remove tomorrow PRN follow up  Curlene Labrum, MD [facility] [address] [place], [place] [address] [phone] (office)

## 2021-02-22 NOTE — Patient Instructions (Signed)
Ok to remove bandage tomorrow.  Neosporin to area as needed.

## 2021-02-24 ENCOUNTER — Other Ambulatory Visit: Payer: Self-pay | Admitting: Family Medicine

## 2021-02-24 DIAGNOSIS — D500 Iron deficiency anemia secondary to blood loss (chronic): Secondary | ICD-10-CM

## 2021-03-01 NOTE — Progress Notes (Signed)
Chronic Care Management Pharmacy Note  02/13/2021 Name:  Matthew Campos MRN:  [id number] DOB:  [birth year]  Summary: T2DM, CKD3B  Recommendations/Changes made from today's visit: Diabetes: Controlled; current treatment: Rybelsus 7mg , farxiga 10mg  , glipizide;  A1C IMPROVED TO 6.7% , GFR 44-->41 Denies personal and family history of Medullary thyroid cancer (MTC) Patient reports blood sugars have improved 150-200s. Counseled on diet.  He is taking rybelsus as prescribed.  Will continue 7mg .  Patient has supply at home and back up RX was sent to the drug store 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  Continue Rybelsus 7mg  daily -->Consider increasing rybelsus to 14mg  vs adding another therapy at follow up [place] GLIPIZIDE Current glucose readings: fasting glucose: 150-200;  post prandial glucose: <180 Denies hypoglycemic/hyperglycemic symptoms Discussed meal planning options and Plate method for healthy eating Avoid sugary drinks and desserts Incorporate balanced protein, non starchy veggies, 1 serving of carbohydrate with each meal Increase water intake Increase physical activity as able Current exercise: N/A Educated on MEDICATIONS (PURPOSE AND SIDE EFFECTS); diet/lifestyle modifications Recommended continue current therapy  Patient Goals/Self-Care Activities Over the next 90 days, patient will:  - take medications as prescribed check glucose DAILY (FASTING) OR IF SYMPTOMATIC, document, and provide at future appointments  Follow Up Plan: Telephone follow up appointment with care management team member scheduled for: 1 month  Subjective: Matthew Campos is an 80 y.o. year old male who is a primary patient of [name], DO.  The CCM team was consulted for assistance with disease management and care coordination needs.    Engaged with patient by telephone for follow up visit in response to provider referral for pharmacy case management and/or care  coordination services.   Consent to Services:  The patient was given information about Chronic Care Management services, agreed to services, and gave verbal consent prior to initiation of services.  Please see initial visit note for detailed documentation.   Patient Care Team: [name], DO as PCP - General (Family Medicine) Curly Rim, MD as Referring Physician (Internal Medicine) [name], DO (Family Medicine) Katha Cabal, LCSW as Social Worker (Licensed Clinical Social Worker) Lottie Dawson D, [facility] (Pharmacist)  Objective:  Lab Results  Component Value Date   CREATININE 1.67 (H) 01/15/2021   CREATININE 1.59 (H) 10/13/2020   CREATININE 1.67 (H) 05/30/2020    Lab Results  Component Value Date   HGBA1C 6.7 (H) 01/15/2021   Last diabetic Eye exam:  Lab Results  Component Value Date/Time   HMDIABEYEEXA No Retinopathy 02/15/2020 12:00 AM    Last diabetic Foot exam: No results found for: HMDIABFOOTEX      Component Value Date/Time   CHOL 146 10/13/2020 1055   TRIG 340 (H) 10/13/2020 1055   HDL 35 (L) 10/13/2020 1055   CHOLHDL 4.2 10/13/2020 1055   CHOLHDL 3.3 04/13/2019 1722   VLDL 18 04/13/2019 1722   LDLCALC 58 10/13/2020 1055    Hepatic Function Latest Ref Rng & Units 01/15/2021 10/13/2020 05/30/2020  Total Protein 6.0 - 8.5 g/dL - 7.3 -  Albumin 3.7 - 4.7 g/dL 4.5 4.5 4.7  AST 0 - 40 IU/L - 16 -  ALT 0 - 44 IU/L - 16 -  Alk Phosphatase 44 - 121 IU/L - 68 -  Total Bilirubin 0.0 - 1.2 mg/dL - 0.5 -    Lab Results  Component Value Date/Time   TSH 1.630 10/13/2020 10:55 AM   TSH 1.860  05/30/2020 10:05 AM   FREET4 1.52 10/13/2020 10:55 AM   FREET4 0.53 (L) 04/14/2019 01:11 AM    CBC Latest Ref Rng & Units 10/13/2020 05/30/2020 02/04/2020  WBC 3.4 - 10.8 x10E3/uL 8.6 9.3 8.5  Hemoglobin 13.0 - 17.7 g/dL 15.4 14.8 13.3  Hematocrit 37.5 - 51.0 % 46.8 46.9 41.0  Platelets 150 - 450 x10E3/uL 146(L) 155 172    No results found for:  VD25OH  Clinical ASCVD: Yes  The ASCVD Risk score (Arnett DK, et al., 2019) failed to calculate for the following reasons:   The 2019 ASCVD risk score is only valid for ages 66 to 20   The patient has a prior MI or stroke diagnosis    Other: (CHADS2VASc if Afib, PHQ9 if depression, MMRC or CAT for COPD, ACT, DEXA)  Social History   Tobacco Use  Smoking Status Former  Smokeless Tobacco Never  Tobacco Comments   quit smoking 20+yrs ago   BP Readings from Last 3 Encounters:  02/22/21 118/76  02/15/21 109/70  02/08/21 114/70   Pulse Readings from Last 3 Encounters:  02/22/21 89  02/15/21 72  02/08/21 78   Wt Readings from Last 3 Encounters:  02/22/21 199 lb (90.3 kg)  02/15/21 198 lb (89.8 kg)  02/08/21 197 lb (89.4 kg)    Assessment: Review of patient past medical history, allergies, medications, health status, including review of consultants reports, laboratory and other test data, was performed as part of comprehensive evaluation and provision of chronic care management services.   SDOH:  (Social Determinants of Health) assessments and interventions performed:    CCM Care Plan  Allergies  Allergen Reactions   Codeine Nausea And Vomiting   Latex Hives and Itching   Lisinopril Cough    Medications Reviewed Today     Reviewed by Lavera Guise, [facility] (Pharmacist) on 03/01/21 at [place] List Status: <None>   Medication Order Taking? Sig Documenting Provider Last Dose Status Informant  acetaZOLAMIDE (DIAMOX) 250 MG tablet [id number] No Take by mouth. [provider] Taking Expired 02/15/21 2359   albuterol (PROVENTIL) (2.5 MG/3ML) 0.083% nebulizer solution [id number] No Take 2.5 mg by nebulization every 6 (six) hours as needed for wheezing or shortness of breath. [provider] Taking Active Spouse/Significant Other  albuterol (VENTOLIN HFA) 108 (90 Base) MCG/ACT inhaler [id number] No INHALE 2 PUFFS INTO THE LUNGS EVERY 6 HOURS AS NEEDED FOR WHEEZING  OR SHORTNESS OF BREATH Ronnie Doss M, DO Taking Active   aspirin EC 81 MG tablet [id number] No Take 81 mg by mouth daily. [provider] Taking Active Spouse/Significant Other           Med Note Bradly Chris, Gloris [place]   Tue Sep 07, 2019  9:28 AM)    calcium carbonate (OSCAL) 1500 (600 Ca) MG TABS tablet [id number] No Take by mouth 2 (two) times daily with a meal. [provider] Taking Active Spouse/Significant Other  clopidogrel (PLAVIX) 75 MG tablet [id number] No TAKE ONE (1) TABLET EACH DAY [place], [place], DO Taking Active   dapagliflozin propanediol (FARXIGA) 10 MG TABS tablet [id number] No TAKE ONE TABLET EACH MORNING BEFORE BREAKFAST Ronnie Doss M, DO Taking Active   dextromethorphan-guaiFENesin (MUCINEX DM) 30-600 MG 12hr tablet [id number] No Take 1 tablet by mouth 2 (two) times daily. Ivy Lynn, NP Taking Active   donepezil (ARICEPT) 5 MG tablet [id number] No Take 5 mg (1 pill) daily for 4 weeks, then increase to 10 mg (2 pills) daily [place],  Anderson [place], MD Taking Active   ferrous sulfate 325 (65 FE) MG EC tablet [id number] No Take 325 mg by mouth every Monday, Wednesday, and Friday.  [provider] Taking Active Spouse/Significant Other  glipiZIDE (GLUCOTROL) 5 MG tablet [id number] No Take 1 tablet (5 mg total) by mouth daily before breakfast. [name], DO Taking Active   levothyroxine (SYNTHROID) 100 MCG tablet [id number] No TAKE ONE (1) TABLET EACH DAY [place], [place], DO Taking Active   lubiprostone (AMITIZA) 24 MCG capsule [id number] No TAKE 1 CAPSULE TWICE A DAY WITH MEALS. Ronnie Doss M, DO Taking Active   mirtazapine (REMERON) 7.5 MG tablet [id number] No Take 1 tablet (7.5 mg total) by mouth at bedtime. STOP Isaac Laud, DO Taking Active   Multiple Vitamin (MULTIVITAMIN WITH MINERALS) TABS [id number] No Take 1 tablet by mouth daily. [provider] Taking Active Spouse/Significant Other  nitroGLYCERIN  (NITROSTAT) 0.4 MG SL tablet [id number]  DISSOLVE 1 TAB UNDER TOUNGE FOR CHEST PAIN. MAY REPEAT EVERY 5 MINUTES FOR 3 DOSES. IF NO RELIEF CALL 911 OR GO TO ER Ronnie Doss M, DO  Active   ondansetron (ZOFRAN-ODT) 4 MG disintegrating tablet [id number] No Take 1 tablet (4 mg total) by mouth every 8 (eight) hours as needed for nausea or vomiting. Dettinger, Fransisca Kaufmann, MD Taking Active   oxyCODONE (ROXICODONE) 5 MG immediate release tablet [id number] No Take 1 tablet (5 mg total) by mouth every 4 (four) hours as needed for severe pain or breakthrough pain. Virl Cagey, MD Taking Active   pantoprazole (PROTONIX) 40 MG tablet [id number]  TAKE ONE (1) TABLET EACH DAY Ronnie Doss M, [place]  Active   rosuvastatin (CRESTOR) 10 MG tablet [id number] No TAKE ONE (1) TABLET EACH DAY Gottsch[place], [place], DO Taking Active   Semaglutide (RYBELSUS) 7 MG TABS [id number] No Take 7 mg by mouth daily. Ronnie Doss M, DO Taking Active   sodium chloride (OCEAN) 0.65 % nasal spray [id number] No Place into the nose. [provider] Taking Active   spironolactone (ALDACTONE) 25 MG tablet [id number] No Take 25 mg by mouth daily.  [provider] 09/06/2019 Unknown time Expired 09/07/19 2359 Spouse/Significant Other  SYMBICORT 80-4.5 MCG/ACT inhaler [id number] No Inhale 2 puffs into the lungs 2 (two) times daily as needed. [provider] Taking Active Spouse/Significant Other  torsemide (DEMADEX) 20 MG tablet [id number] No TAKE TWO TABLETS BY MOUTH TWICE DAILY [name], DO Taking Active   traZODone (DESYREL) 50 MG tablet [id number] No TAKE 1/2 TO 1 TABLET AT BEDTIME AS NEEDED FOR SLEEP [name], DO Taking Active             Patient Active Problem List   Diagnosis Date Noted   Infected sebaceous cyst 01/30/2021   Viral upper respiratory tract infection 10/25/2020   Hyperlipidemia associated with type 2 diabetes mellitus ([place]) 02/04/2020   CAP (community acquired  pneumonia) 09/07/2019   Hypomagnesemia 09/07/2019   CKD (chronic kidney disease), stage III ([place]) 09/07/2019   Volume overload 09/07/2019   Community acquired pneumonia 09/07/2019   GI bleeding 04/14/2019   Acute on chronic diastolic heart failure (HCC)    AKI (acute kidney injury) ([place])    Anemia due to blood loss    Bradycardia    Renal insufficiency    UGI bleed 04/13/2019   Chest pain 04/13/2019   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/13/2019   Cholecystostomy care ([place]) 04/13/2019   Chronic blood loss anemia  04/13/2019   COPD (chronic obstructive pulmonary disease) (HCC)    Coronary artery disease    Chronic ischemic heart disease    Paroxysmal atrial fibrillation (HCC)    Cholecystitis    OSA (obstructive sleep apnea)    Coagulopathy (HCC)    Chronic anticoagulation    Dysphagia    Elevated alkaline phosphatase measurement    Elevated bilirubin    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   Sinus bradycardia 02/18/2019   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   Normocytic anemia 12/23/2018   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[place]) 12/23/2018   OSA (obstructive sleep apnea) 12/23/2018   Severe aortic stenosis 11/26/2018   Anxiety 10/28/2018   CHF (congestive heart failure) ([place]) 10/28/2018   At risk for falls 10/28/2018   Oxygen dependent 10/28/2018   Major depressive disorder, single episode, severe without psychotic features ([place]) 07/21/2017   Thrombocytopenia ([place]) 07/21/2017   COPD (chronic obstructive pulmonary disease) ([place]) 12/27/2015   CAD (coronary artery disease) 12/27/2015   Gastroesophageal reflux disease 12/27/2015   Hypertension associated with diabetes ([place]) 12/27/2015   Hyperlipidemia 09/09/2013   Type 2 diabetes mellitus ([place]) 09/09/2013    Immunization History  Administered Date(s) Administered   Fluad Quad(high Dose 65+) 02/04/2020, 01/15/2021   Influenza, High Dose Seasonal PF  02/29/2016, 01/20/2017, 02/10/2018   Influenza, Seasonal, Injecte, Preservative Fre 02/03/2014   Influenza,inj,Quad PF,6+ Mos 01/01/2019   Influenza-Unspecified 02/03/2014, 02/29/2016, 01/20/2017, 02/10/2018   Moderna Sars-Covid-2 Vaccination 07/05/2019, 08/02/2019, 02/23/2020   PNEUMOCOCCAL CONJUGATE-20 01/15/2021   Pneumococcal Conjugate-13 11/25/2014, 11/25/2014   Pneumococcal Polysaccharide-23 05/30/2016, 05/30/2016, 01/01/2019   Tdap 11/25/2014, 11/25/2014    Conditions to be addressed/monitored: CKD Stage 3B , t2DM  There are no care plans that you recently modified to display for this patient.   Medication Assistance: None required.  Patient affirms current coverage meets needs.  Patient's preferred pharmacy is:  [facility] [address], [place] [address] Phone: [phone] Fax: [phone]   Follow Up:  Patient agrees to Care Plan and Follow-up.  Plan: Telephone follow up appointment with care management team member scheduled for:  3 WEEKS    Regina Eck, PharmD, BCPS Clinical Pharmacist, [place]  II Phone [phone]

## 2021-03-01 NOTE — Telephone Encounter (Signed)
Called [employer] to check on status of donepezil PA. It was approved as of 02/23/21.

## 2021-03-01 NOTE — Patient Instructions (Signed)
Visit Information  Thank you for taking time to visit with me today. Please don't hesitate to contact me if I can be of assistance to you before our next scheduled telephone appointment.  Telephone follow up appointment with care management team member scheduled for: 3 WEEKS  If you need to cancel or re-schedule our visit, please call [phone] and our care guide team will be happy to assist you.  Following is a list of the goals we discussed today:  Current Barriers:  Unable to achieve control of T2DM  Suboptimal therapeutic regimen for T2DM & CKD 3B  Pharmacist Clinical Goal(s):  Over the next 90 days, patient will achieve control of T2DM & CKD3B as evidenced by IMPROVED GLYCEMIC CONTROL through collaboration with PharmD and provider.   Interventions: 1:1 collaboration with [name], DO regarding development and update of comprehensive plan of care as evidenced by provider attestation and co-signature Inter-disciplinary care team collaboration (see longitudinal plan of care) Comprehensive medication review performed; medication list updated in electronic medical record  Diabetes: C ontrolled; current treatment: Rybelsus 7mg , farxiga 10mg  , glipizide;  A1C IMPROVED TO 6.7% , GFR 44-->41 Denies personal and family history of Medullary thyroid cancer (MTC) Patient reports blood sugars have improved 150-200s. Counseled on diet.  He is taking rybelsus as prescribed.  Will continue 7mg .  Patient has supply at home and back up RX was sent to the drug store 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  Continue Rybelsus 7mg  daily -->Consider increasing rybelsus to 14mg  vs adding another therapy at follow up [place] GLIPIZIDE Current glucose readings: fasting glucose: 150-200;  post prandial glucose: <180 Denies hypoglycemic/hyperglycemic symptoms Discussed meal planning options and Plate method for healthy eating Avoid sugary drinks and desserts Incorporate  balanced protein, non starchy veggies, 1 serving of carbohydrate with each meal Increase water intake Increase physical activity as able Current exercise: N/A Educated on MEDICATIONS (PURPOSE AND SIDE EFFECTS); diet/lifestyle modifications Recommended continue current therapy  Patient Goals/Self-Care Activities Over the next 90 days, patient will:  - take medications as prescribed check glucose DAILY (FASTING) OR IF SYMPTOMATIC, document, and provide at future appointments  Follow Up Plan: Telephone follow up appointment with care management team member scheduled for: 1 month   The patient verbalized understanding of instructions, educational materials, and care plan provided today and declined offer to receive copy of patient instructions, educational materials, and care plan.    Regina Eck, PharmD, BCPS Clinical Pharmacist, [place]  II Phone [phone]

## 2021-03-13 ENCOUNTER — Ambulatory Visit: Payer: Medicare Other | Admitting: Licensed Clinical Social Worker

## 2021-03-13 DIAGNOSIS — N1831 Chronic kidney disease, stage 3a: Secondary | ICD-10-CM

## 2021-03-13 DIAGNOSIS — G4733 Obstructive sleep apnea (adult) (pediatric): Secondary | ICD-10-CM

## 2021-03-13 DIAGNOSIS — F331 Major depressive disorder, recurrent, moderate: Secondary | ICD-10-CM

## 2021-03-13 DIAGNOSIS — I152 Hypertension secondary to endocrine disorders: Secondary | ICD-10-CM

## 2021-03-13 DIAGNOSIS — I509 Heart failure, unspecified: Secondary | ICD-10-CM

## 2021-03-13 DIAGNOSIS — D414 Neoplasm of uncertain behavior of bladder: Secondary | ICD-10-CM

## 2021-03-13 DIAGNOSIS — K219 Gastro-esophageal reflux disease without esophagitis: Secondary | ICD-10-CM

## 2021-03-13 DIAGNOSIS — E1169 Type 2 diabetes mellitus with other specified complication: Secondary | ICD-10-CM

## 2021-03-13 DIAGNOSIS — F32A Depression, unspecified: Secondary | ICD-10-CM

## 2021-03-13 DIAGNOSIS — F419 Anxiety disorder, unspecified: Secondary | ICD-10-CM

## 2021-03-13 DIAGNOSIS — J449 Chronic obstructive pulmonary disease, unspecified: Secondary | ICD-10-CM

## 2021-03-13 NOTE — Chronic Care Management (AMB) (Signed)
Chronic Care Management    Clinical Social Work Note  03/13/2021 Name: Matthew Campos MRN: [id number] DOB: 11/27/1940  Matthew Campos is a 80 y.o. year old male who is a primary care patient of Matthew Norlander, DO. The CCM team was consulted to assist the patient with chronic disease management and/or care coordination needs related to: [employer] .   Engaged with patient by telephone for follow up visit in response to provider referral for social work chronic care management and care coordination services.   Consent to Services:  The patient was given information about Chronic Care Management services, agreed to services, and gave verbal consent prior to initiation of services.  Please see initial visit note for detailed documentation.   Patient agreed to services and consent obtained.   Assessment: Review of patient past medical history, allergies, medications, and health status, including review of relevant consultants reports was performed today as part of a comprehensive evaluation and provision of chronic care management and care coordination services.     SDOH (Social Determinants of Health) assessments and interventions performed:  SDOH Interventions    Flowsheet Row Most Recent Value  SDOH Interventions   Physical Activity Interventions Other (Comments)  [client has walking challenges. he uses a walker to help him walk]  Stress Interventions Provide Counseling  [client has stress related to managing memory issues. client has stress in managing medical needs.]  Depression Interventions/Treatment  Currently on Treatment        Advanced Directives Status: See [employer]yn[employer] application for related entries.  CCM Care Plan  Allergies  Allergen Reactions   Codeine Nausea And Vomiting   Latex Hives and Itching   Lisinopril Cough    Outpatient Encounter Medications as of 03/13/2021  Medication Sig Note   acetaZOLAMIDE (DIAMOX) 250 MG tablet Take by mouth.     albuterol (PROVENTIL) (2.5 MG/3ML) 0.083% nebulizer solution Take 2.5 mg by nebulization every 6 (six) hours as needed for wheezing or shortness of breath.    albuterol (VENTOLIN HFA) 108 (90 Base) MCG/ACT inhaler INHALE 2 PUFFS INTO THE LUNGS EVERY 6 HOURS AS NEEDED FOR WHEEZING OR SHORTNESS OF BREATH    aspirin EC 81 MG tablet Take 81 mg by mouth daily.    calcium carbonate (OSCAL) 1500 (600 Ca) MG TABS tablet Take by mouth 2 (two) times daily with a meal.    clopidogrel (PLAVIX) 75 MG tablet TAKE ONE (1) TABLET EACH DAY    dapagliflozin propanediol (FARXIGA) 10 MG TABS tablet TAKE ONE TABLET EACH MORNING BEFORE BREAKFAST    dextromethorphan-guaiFENesin (MUCINEX DM) 30-600 MG 12hr tablet Take 1 tablet by mouth 2 (two) times daily.    donepezil (ARICEPT) 5 MG tablet Take 5 mg (1 pill) daily for 4 weeks, then increase to 10 mg (2 pills) daily 03/01/2021: 03/01/21 approved as of 02/23/21   ferrous sulfate 325 (65 FE) MG EC tablet Take 325 mg by mouth every Monday, Wednesday, and Friday.     glipiZIDE (GLUCOTROL) 5 MG tablet Take 1 tablet (5 mg total) by mouth daily before breakfast.    levothyroxine (SYNTHROID) 100 MCG tablet TAKE ONE (1) TABLET EACH DAY    lubiprostone (AMITIZA) 24 MCG capsule TAKE 1 CAPSULE TWICE A DAY WITH MEALS.    mirtazapine (REMERON) 7.5 MG tablet Take 1 tablet (7.5 mg total) by mouth at bedtime. STOP LEXAPRO    Multiple Vitamin (MULTIVITAMIN WITH MINERALS) TABS Take 1 tablet by mouth daily.    nitroGLYCERIN (NITROSTAT) 0.4 MG SL  tablet DISSOLVE 1 TAB UNDER TOUNGE FOR CHEST PAIN. MAY REPEAT EVERY 5 MINUTES FOR 3 DOSES. IF NO RELIEF CALL 911 OR GO TO ER    ondansetron (ZOFRAN-ODT) 4 MG disintegrating tablet Take 1 tablet (4 mg total) by mouth every 8 (eight) hours as needed for nausea or vomiting.    oxyCODONE (ROXICODONE) 5 MG immediate release tablet Take 1 tablet (5 mg total) by mouth every 4 (four) hours as needed for severe pain or breakthrough pain.    pantoprazole  (PROTONIX) 40 MG tablet TAKE ONE (1) TABLET EACH DAY    rosuvastatin (CRESTOR) 10 MG tablet TAKE ONE (1) TABLET EACH DAY    Semaglutide (RYBELSUS) 7 MG TABS Take 7 mg by mouth daily.    sodium chloride (OCEAN) 0.65 % nasal spray Place into the nose.    spironolactone (ALDACTONE) 25 MG tablet Take 25 mg by mouth daily.     SYMBICORT 80-4.5 MCG/ACT inhaler Inhale 2 puffs into the lungs 2 (two) times daily as needed.    torsemide (DEMADEX) 20 MG tablet TAKE TWO TABLETS BY MOUTH TWICE DAILY    traZODone (DESYREL) 50 MG tablet TAKE 1/2 TO 1 TABLET AT BEDTIME AS NEEDED FOR SLEEP    No facility-administered encounter medications on file as of 03/13/2021.    Patient Active Problem List   Diagnosis Date Noted   Infected sebaceous cyst 01/30/2021   Viral upper respiratory tract infection 10/25/2020   Hyperlipidemia associated with type 2 diabetes mellitus ([place]) 02/04/2020   CAP (community acquired pneumonia) 09/07/2019   Hypomagnesemia 09/07/2019   CKD (chronic kidney disease), stage III ([place]) 09/07/2019   Volume overload 09/07/2019   Community acquired pneumonia 09/07/2019   GI bleeding 04/14/2019   Acute on chronic diastolic heart failure (HCC)    AKI (acute kidney injury) ([place])    Anemia due to blood loss    Bradycardia    Renal insufficiency    UGI bleed 04/13/2019   Chest pain 04/13/2019   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/13/2019   Cholecystostomy care ([place]) 04/13/2019   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   COPD (chronic obstructive pulmonary disease) (HCC)    Coronary artery disease    Chronic ischemic heart disease    Paroxysmal atrial fibrillation (HCC)    Cholecystitis    OSA (obstructive sleep apnea)    Coagulopathy (HCC)    Chronic anticoagulation    Dysphagia    Elevated alkaline phosphatase measurement    Elevated bilirubin    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   Sinus bradycardia 02/18/2019   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   Normocytic anemia 12/23/2018   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[place]) 12/23/2018   OSA (obstructive sleep apnea) 12/23/2018   Severe aortic stenosis 11/26/2018   Anxiety 10/28/2018   CHF (congestive heart failure) ([place]) 10/28/2018   At risk for falls 10/28/2018   Oxygen dependent 10/28/2018   Major depressive disorder, single episode, severe without psychotic features ([place]) 07/21/2017   Thrombocytopenia ([place]) 07/21/2017   COPD (chronic obstructive pulmonary disease) ([place]) 12/27/2015   CAD (coronary artery disease) 12/27/2015   Gastroesophageal reflux disease 12/27/2015   Hypertension associated with diabetes ([place]) 12/27/2015   Hyperlipidemia 09/09/2013   Type 2 diabetes mellitus ([place]) 09/09/2013    Conditions to be addressed/monitored: monitor client management of depression issues  Care Plan : LCSW Care Plan  Updates made by Katha Cabal, LCSW since 03/13/2021 12:00 AM     Problem: Emotional  Distress      Goal: Emotional Health Supported; Manage Depression symptoms faced   Start Date: 03/13/2021  Expected End Date: 06/07/2021  This Visit's Progress: On track  Recent Progress: On track  Priority: Medium  Note:   Current Barriers:  Mental Health needs related to depression and depression management Mobility issues Memory challenges Suicidal Ideation/Homicidal Ideation: No  Clinical Social Work Goal(s):  patient will work with SW monthly by telephone or in person to reduce or manage symptoms related to depression and depression management Client to call RNCM as needed in next 30 days to address nursing needs of client Patient will communicate with LCSW in next 30 days to discuss mobility challenges of client  Interventions: 1:1 collaboration with Matthew Norlander, DO regarding development and update of comprehensive plan of care as evidenced by provider attestation and co-signature Discussed with client the mobility of  client. He said it is difficult for him to walk longer distances, He uses a walker to ambulate. He said he has talked with PCP about scooter.  He said he has appointment in [place], [place] in December of 2022 to get measurements made for possible scooter for client Discussed food intake with Matthew Campos. He said he has reduced appetite Reviewed transport needs with client. He said he drives short distances; he said his wife often transports him to and from appointments Reviewed family support with Matthew Campos, including support from his spouse and from his son Matthew Campos client regarding client mood He said he does get sad occasionally.  He said he is hoping to get scooter soon and may get a little upset about process of getting scooter.  He thinks use of scooter will be helpful. He spoke of fall risk.  He said he gets help from his wife and son and this is helpful related to managing his mood. He also gets some help from his grandchildren. Discussed relaxation techniques to help him relax. He likes being outdoors, likes mowing yard, likes sitting on his porch and looking at nature. Interviewed client related to oxygen use. He said he uses oxygen at night to help him with breathing Reviewed with Matthew Campos the skin care needs of client. He said that the area which was excised on his shoulder is now healed. He said that area is not draining and is healed .  Provided counseling support for client Discussed energy level of client. He said he is often fatigued and often has little energy. Discussed pain issues of client, sleeping issues of client, and appetite of client  Patient Self Care Activities:  Completes ADLs as able Attends scheduled medical appointments  Patient Coping Strengths:  Support received from his wife, Matthew Campos received from his son.  Patient Self Care Deficits:  Walking challenges  Patient Goals:  - spend time or talk with others at least 2 to 3 times per week - practice relaxation or  meditation daily - keep a calendar with appointment dates  Follow Up Plan: LCSW to call client or spouse of client on 05/11/21 at 2:00 PM to assess client needs      Matthew Campos MSW, LCSW Licensed Clinical Social Worker [facility] Care Management [phone]

## 2021-03-13 NOTE — Patient Instructions (Addendum)
Visit Information  Patient goals: Manage emotions. Manage depression symptoms  Timeframe:  Short-Term Goal Priority:  Medium Progress: On Track Start Date:            03/13/21                 Expected End Date:          06/07/21             Follow Up Date 05/11/21 at 2:00 PM   Manage Emotions. Manage Depression symptoms    Why is this important?   When you are stressed, down or upset, your body reacts too.  For example, your blood pressure may get higher; you may have a headache or stomachache.  When your emotions get the best of you, your body's ability to fight off cold and flu gets weak.  These steps will help you manage your emotions.     Patient Self Care Activities:  Completes ADLs as able Attends scheduled medical appointments  Patient Coping Strengths:  Support received from his wife, [name] received from his son.  Patient Self Care Deficits:  Walking challenges  Patient Goals:  - spend time or talk with others at least 2 to 3 times per week - practice relaxation or meditation daily - keep a calendar with appointment dates  Follow Up Plan: LCSW to call client or spouse of client on 05/11/21 at 2:00 PM to assess client needs   [name].Matthew Campos MSW, LCSW Licensed Clinical Social Worker [facility] Care Management [phone]

## 2021-03-14 DIAGNOSIS — N1831 Chronic kidney disease, stage 3a: Secondary | ICD-10-CM | POA: Diagnosis not present

## 2021-03-14 DIAGNOSIS — E1122 Type 2 diabetes mellitus with diabetic chronic kidney disease: Secondary | ICD-10-CM | POA: Diagnosis not present

## 2021-03-14 DIAGNOSIS — E1159 Type 2 diabetes mellitus with other circulatory complications: Secondary | ICD-10-CM | POA: Diagnosis not present

## 2021-03-15 ENCOUNTER — Ambulatory Visit (INDEPENDENT_AMBULATORY_CARE_PROVIDER_SITE_OTHER): Payer: Medicare Other | Admitting: Pharmacist

## 2021-03-15 DIAGNOSIS — E782 Mixed hyperlipidemia: Secondary | ICD-10-CM

## 2021-03-15 DIAGNOSIS — E1165 Type 2 diabetes mellitus with hyperglycemia: Secondary | ICD-10-CM

## 2021-03-15 NOTE — Progress Notes (Signed)
Chronic Care Management Pharmacy Note  03/15/2021 Name:  Matthew Campos MRN:  [id number] DOB:  Jan 11, 1941  Summary: T2DM, HLD  Recommendations/Changes made from today's visit: Diabetes: Controlled; current treatment: Rybelsus 7mg , farxiga 10mg  , glipizide;  A1C IMPROVED TO 6.7% , GFR 44-->41 Denies personal and family history of Medullary thyroid cancer (MTC) Patient's wife reports blood sugars have improved 140-175. Counseled on diet.  He is taking rybelsus as prescribed.  Will continue 7mg  daily.  Patient has supply at home and back up RX was sent to the drug store.  Patient is now suffering from dementia (new start Aricept). 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  Continue Rybelsus 7mg  daily  WOULD LIKE TO DROP THE GLIPIZIDE Current glucose readings: fasting glucose: 140-175;  post prandial glucose: <180 Denies hypoglycemic/hyperglycemic symptoms Discussed meal planning options and Plate method for healthy eating Avoid sugary drinks and desserts Incorporate balanced protein, non starchy veggies, 1 serving of carbohydrate with each meal Increase water intake Increase physical activity as able Current exercise: N/A Educated on MEDICATIONS (PURPOSE AND SIDE EFFECTS); diet/lifestyle modifications Recommended continue current therapy Reviewed lipid panel/HLD-->continue rosuvastatin  Patient Goals/Self-Care Activities Over the next 90 days, patient will:  - take medications as prescribed check glucose DAILY (FASTING) OR IF SYMPTOMATIC, document, and provide at future appointments  Follow Up Plan: Telephone follow up appointment with care management team member scheduled for: 3 months  Subjective: Matthew Campos is an 80 y.o. year old male who is a primary patient of [name], DO.  The CCM team was consulted for assistance with disease management and care coordination needs.    Engaged with patient by telephone for follow up visit in response to provider  referral for pharmacy case management and/or care coordination services.   Consent to Services:  The patient was given information about Chronic Care Management services, agreed to services, and gave verbal consent prior to initiation of services.  Please see initial visit note for detailed documentation.   Patient Care Team: [name], DO as PCP - General (Family Medicine) Curly Rim, MD as Referring Physician (Internal Medicine) [name], DO (Family Medicine) Katha Cabal, LCSW as Social Worker (Licensed Clinical Social Worker) Lottie Dawson D, [facility] (Pharmacist)  Objective:  Lab Results  Component Value Date   CREATININE 1.67 (H) 01/15/2021   CREATININE 1.59 (H) 10/13/2020   CREATININE 1.67 (H) 05/30/2020    Lab Results  Component Value Date   HGBA1C 6.7 (H) 01/15/2021   Last diabetic Eye exam:  Lab Results  Component Value Date/Time   HMDIABEYEEXA No Retinopathy 02/15/2020 12:00 AM    Last diabetic Foot exam: No results found for: HMDIABFOOTEX      Component Value Date/Time   CHOL 146 10/13/2020 1055   TRIG 340 (H) 10/13/2020 1055   HDL 35 (L) 10/13/2020 1055   CHOLHDL 4.2 10/13/2020 1055   CHOLHDL 3.3 04/13/2019 1722   VLDL 18 04/13/2019 1722   LDLCALC 58 10/13/2020 1055    Hepatic Function Latest Ref Rng & Units 01/15/2021 10/13/2020 05/30/2020  Total Protein 6.0 - 8.5 g/dL - 7.3 -  Albumin 3.7 - 4.7 g/dL 4.5 4.5 4.7  AST 0 - 40 IU/L - 16 -  ALT 0 - 44 IU/L - 16 -  Alk Phosphatase 44 - 121 IU/L - 68 -  Total Bilirubin 0.0 - 1.2 mg/dL - 0.5 -    Lab Results  Component Value Date/Time   TSH 1.630 10/13/2020 10:55  AM   TSH 1.860 05/30/2020 10:05 AM   FREET4 1.52 10/13/2020 10:55 AM   FREET4 0.53 (L) 04/14/2019 01:11 AM    CBC Latest Ref Rng & Units 10/13/2020 05/30/2020 02/04/2020  WBC 3.4 - 10.8 x10E3/uL 8.6 9.3 8.5  Hemoglobin 13.0 - 17.7 g/dL 15.4 14.8 13.3  Hematocrit 37.5 - 51.0 % 46.8 46.9 41.0  Platelets 150 - 450  x10E3/uL 146(L) 155 172    No results found for: VD25OH  Clinical ASCVD: Yes  The ASCVD Risk score (Arnett DK, et al., 2019) failed to calculate for the following reasons:   The 2019 ASCVD risk score is only valid for ages 74 to 33   The patient has a prior MI or stroke diagnosis    Other: (CHADS2VASc if Afib, PHQ9 if depression, MMRC or CAT for COPD, ACT, DEXA)  Social History   Tobacco Use  Smoking Status Former  Smokeless Tobacco Never  Tobacco Comments   quit smoking 20+yrs ago   BP Readings from Last 3 Encounters:  02/22/21 118/76  02/15/21 109/70  02/08/21 114/70   Pulse Readings from Last 3 Encounters:  02/22/21 89  02/15/21 72  02/08/21 78   Wt Readings from Last 3 Encounters:  02/22/21 199 lb (90.3 kg)  02/15/21 198 lb (89.8 kg)  02/08/21 197 lb (89.4 kg)    Assessment: Review of patient past medical history, allergies, medications, health status, including review of consultants reports, laboratory and other test data, was performed as part of comprehensive evaluation and provision of chronic care management services.   SDOH:  (Social Determinants of Health) assessments and interventions performed:    CCM Care Plan  Allergies  Allergen Reactions   Codeine Nausea And Vomiting   Latex Hives and Itching   Lisinopril Cough    Medications Reviewed Today     Reviewed by Lavera Guise, [facility] (Pharmacist) on 03/16/21 at [id number]  Med List Status: <None>   Medication Order Taking? Sig Documenting Provider Last Dose Status Informant  acetaZOLAMIDE (DIAMOX) 250 MG tablet [id number] No Take by mouth. [provider] Taking Expired 02/15/21 2359   albuterol (PROVENTIL) (2.5 MG/3ML) 0.083% nebulizer solution [id number] No Take 2.5 mg by nebulization every 6 (six) hours as needed for wheezing or shortness of breath. [provider] Taking Active Spouse/Significant Other  albuterol (VENTOLIN HFA) 108 (90 Base) MCG/ACT inhaler [id number] No INHALE 2 PUFFS  INTO THE LUNGS EVERY 6 HOURS AS NEEDED FOR WHEEZING OR SHORTNESS OF BREATH Ronnie Doss M, DO Taking Active   aspirin EC 81 MG tablet [id number] No Take 81 mg by mouth daily. [provider] Taking Active Spouse/Significant Other           Med Note Bradly Chris, Gloris [place]   Tue Sep 07, 2019  9:28 AM)    calcium carbonate (OSCAL) 1500 (600 Ca) MG TABS tablet [id number] No Take by mouth 2 (two) times daily with a meal. [provider] Taking Active Spouse/Significant Other  clopidogrel (PLAVIX) 75 MG tablet [id number] No TAKE ONE (1) TABLET EACH DAY [place], [place], DO Taking Active   dapagliflozin propanediol (FARXIGA) 10 MG TABS tablet [id number] No TAKE ONE TABLET EACH MORNING BEFORE BREAKFAST Ronnie Doss M, DO Taking Active   dextromethorphan-guaiFENesin (MUCINEX DM) 30-600 MG 12hr tablet [id number] No Take 1 tablet by mouth 2 (two) times daily. Ivy Lynn, NP Taking Active   donepezil (ARICEPT) 5 MG tablet [id number] No Take 5 mg (1 pill) daily for 4 weeks, then increase to 10  mg (2 pills) daily Genia Harold, MD Taking Active            Med Note Pollyann Kennedy Mar 01, 2021  9:16 AM) 03/01/21 approved as of 02/23/21  ferrous sulfate 325 (65 FE) MG EC tablet [id number] No Take 325 mg by mouth every Monday, Wednesday, and Friday.  [provider] Taking Active Spouse/Significant Other  glipiZIDE (GLUCOTROL) 5 MG tablet [id number] No Take 1 tablet (5 mg total) by mouth daily before breakfast. [name], DO Taking Active   levothyroxine (SYNTHROID) 100 MCG tablet [id number] No TAKE ONE (1) TABLET EACH DAY Gottschalk, [place], DO Taking Active   lubiprostone (AMITIZA) 24 MCG capsule [id number] No TAKE 1 CAPSULE TWICE A DAY WITH MEALS. Ronnie Doss M, DO Taking Active   mirtazapine (REMERON) 7.5 MG tablet [id number] No Take 1 tablet (7.5 mg total) by mouth at bedtime. STOP Isaac Laud, DO Taking Active   Multiple Vitamin  (MULTIVITAMIN WITH MINERALS) TABS [id number] No Take 1 tablet by mouth daily. [provider] Taking Active Spouse/Significant Other  nitroGLYCERIN (NITROSTAT) 0.4 MG SL tablet [id number]  DISSOLVE 1 TAB UNDER TOUNGE FOR CHEST PAIN. MAY REPEAT EVERY 5 MINUTES FOR 3 DOSES. IF NO RELIEF CALL 911 OR GO TO ER Ronnie Doss M, DO  Active   ondansetron (ZOFRAN-ODT) 4 MG disintegrating tablet [id number] No Take 1 tablet (4 mg total) by mouth every 8 (eight) hours as needed for nausea or vomiting. Dettinger, Fransisca Kaufmann, MD Taking Active   oxyCODONE (ROXICODONE) 5 MG immediate release tablet [id number] No Take 1 tablet (5 mg total) by mouth every 4 (four) hours as needed for severe pain or breakthrough pain. Virl Cagey, MD Taking Active   pantoprazole (PROTONIX) 40 MG tablet [id number]  TAKE ONE (1) TABLET EACH DAY Ronnie Doss M, [place]  Active   rosuvastatin (CRESTOR) 10 MG tablet [id number] No TAKE ONE (1) TABLET EACH DAY Gottschalk, [place], DO Taking Active   Semaglutide (RYBELSUS) 7 MG TABS [id number] No Take 7 mg by mouth daily. Ronnie Doss M, DO Taking Active   sodium chloride (OCEAN) 0.65 % nasal spray [id number] No Place into the nose. [provider] Taking Active   spironolactone (ALDACTONE) 25 MG tablet [id number] No Take 25 mg by mouth daily.  [provider] 09/06/2019 Unknown time Expired 09/07/19 2359 Spouse/Significant Other  SYMBICORT 80-4.5 MCG/ACT inhaler [id number] No Inhale 2 puffs into the lungs 2 (two) times daily as needed. [provider] Taking Active Spouse/Significant Other  torsemide (DEMADEX) 20 MG tablet [id number] No TAKE TWO TABLETS BY MOUTH TWICE DAILY [name], DO Taking Active   traZODone (DESYREL) 50 MG tablet [id number] No TAKE 1/2 TO 1 TABLET AT BEDTIME AS NEEDED FOR SLEEP [name], DO Taking Active             Patient Active Problem List   Diagnosis Date Noted   Infected sebaceous cyst 01/30/2021    Viral upper respiratory tract infection 10/25/2020   Hyperlipidemia associated with type 2 diabetes mellitus ([place]) 02/04/2020   CAP (community acquired pneumonia) 09/07/2019   Hypomagnesemia 09/07/2019   CKD (chronic kidney disease), stage III ([place]) 09/07/2019   Volume overload 09/07/2019   Community acquired pneumonia 09/07/2019   GI bleeding 04/14/2019   Acute on chronic diastolic heart failure ([place])    AKI (acute kidney injury) ([place])    Anemia due to blood loss    Bradycardia    Renal insufficiency  UGI bleed 04/13/2019   Chest pain 04/13/2019   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/13/2019   Cholecystostomy care ([place]) 04/13/2019   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   COPD (chronic obstructive pulmonary disease) (HCC)    Coronary artery disease    Chronic ischemic heart disease    Paroxysmal atrial fibrillation (HCC)    Cholecystitis    OSA (obstructive sleep apnea)    Coagulopathy (HCC)    Chronic anticoagulation    Dysphagia    Elevated alkaline phosphatase measurement    Elevated bilirubin    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   Sinus bradycardia 02/18/2019   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   Normocytic anemia 12/23/2018   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[place]) 12/23/2018   OSA (obstructive sleep apnea) 12/23/2018   Severe aortic stenosis 11/26/2018   Anxiety 10/28/2018   CHF (congestive heart failure) ([place]) 10/28/2018   At risk for falls 10/28/2018   Oxygen dependent 10/28/2018   Major depressive disorder, single episode, 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/21/2017   Thrombocytopenia ([place]) 07/21/2017   COPD (chronic obstructive pulmonary disease) ([place]) 12/27/2015   CAD (coronary artery disease) 12/27/2015   Gastroesophageal reflux disease 12/27/2015   Hypertension associated with diabetes ([place]) 12/27/2015   Hyperlipidemia 09/09/2013   Type 2 diabetes mellitus ([place]) 09/09/2013     Immunization History  Administered Date(s) Administered   Fluad Quad(high Dose 65+) 02/04/2020, 01/15/2021   Influenza, High Dose Seasonal PF 02/29/2016, 01/20/2017, 02/10/2018   Influenza, Seasonal, Injecte, Preservative Fre 02/03/2014   Influenza,inj,Quad PF,6+ Mos 01/01/2019   Influenza-Unspecified 02/03/2014, 02/29/2016, 01/20/2017, 02/10/2018   Moderna Sars-Covid-2 Vaccination 07/05/2019, 08/02/2019, 02/23/2020   PNEUMOCOCCAL CONJUGATE-20 01/15/2021   Pneumococcal Conjugate-13 11/25/2014, 11/25/2014   Pneumococcal Polysaccharide-23 05/30/2016, 05/30/2016, 01/01/2019   Tdap 11/25/2014, 11/25/2014    Conditions to be addressed/monitored: HLD and DMII  Care Plan : PHARMD MEDICATION MANAGEMENT  Updates made by Lavera Guise, [place] since 03/16/2021 12:00 AM     Problem: DISEASE PROGRESSION PREVENTION      Long-Range Goal: T2DM   This Visit's Progress: On track  Recent Progress: Not on track  Priority: High  Note:   Current Barriers:  Unable to achieve control of T2DM  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   Interventions: 1:1 collaboration with [name], DO regarding development and update of comprehensive plan of care as evidenced by provider attestation and co-signature Inter-disciplinary care team collaboration (see longitudinal plan of care) Comprehensive medication review performed; medication list updated in electronic medical record  Diabetes: Controlled; current treatment: Rybelsus 7mg , farxiga 10mg  , glipizide;  A1C IMPROVED TO 6.7% , GFR 44-->41 Denies personal and family history of Medullary thyroid cancer (MTC) Patient's wife reports blood sugars have improved 140-175. Counseled on diet.  He is taking rybelsus as prescribed.  Will continue 7mg   daily.  Patient has supply at home and back up RX was sent to the drug store.  Patient is now suffering from dementia (new start Aricept). Continue Farxiga 10mg  for MOL0B and T2DM  Continue Rybelsus 7mg  daily  WOULD LIKE TO DROP THE GLIPIZIDE Current glucose readings: fasting glucose: 140-175;  post prandial glucose: <180 Denies hypoglycemic/hyperglycemic symptoms Discussed meal planning options and Plate method for healthy eating Avoid sugary drinks and desserts Incorporate balanced protein,  non starchy veggies, 1 serving of carbohydrate with each meal Increase water intake Increase physical activity as able Current exercise: N/A Educated on MEDICATIONS (PURPOSE AND SIDE EFFECTS); diet/lifestyle modifications Recommended continue current therapy Reviewed lipid panel/HLD-->continue rosuvastatin  Patient Goals/Self-Care Activities Over the next 90 days, patient will:  - take medications as prescribed check glucose DAILY (FASTING) OR IF SYMPTOMATIC, document, and provide at future appointments  Follow Up Plan: Telephone follow up appointment with care management team member scheduled for: 3 months      Medication Assistance: None required.  Patient affirms current coverage meets needs.  Patient's preferred pharmacy is:  [facility] - [address], [address] [place] [address] Phone: [phone] Fax: [phone]  Uses pill box? Yes Pt endorses 100% compliance (WIFE ORGANIZES; PT WITH DEMENTIA)  Follow Up:  Patient agrees to Care Plan and Follow-up.  Plan: Telephone follow up appointment with care management team member scheduled for:  3 MONTHS  Regina Eck, PharmD, BCPS Clinical Pharmacist, [place]  II Phone [phone]

## 2021-03-16 ENCOUNTER — Telehealth: Payer: Self-pay | Admitting: Family Medicine

## 2021-03-16 ENCOUNTER — Ambulatory Visit: Payer: Medicare Other | Admitting: Licensed Clinical Social Worker

## 2021-03-16 DIAGNOSIS — J449 Chronic obstructive pulmonary disease, unspecified: Secondary | ICD-10-CM

## 2021-03-16 DIAGNOSIS — G4733 Obstructive sleep apnea (adult) (pediatric): Secondary | ICD-10-CM

## 2021-03-16 DIAGNOSIS — I509 Heart failure, unspecified: Secondary | ICD-10-CM

## 2021-03-16 DIAGNOSIS — E1122 Type 2 diabetes mellitus with diabetic chronic kidney disease: Secondary | ICD-10-CM

## 2021-03-16 DIAGNOSIS — E1159 Type 2 diabetes mellitus with other circulatory complications: Secondary | ICD-10-CM

## 2021-03-16 DIAGNOSIS — N1831 Chronic kidney disease, stage 3a: Secondary | ICD-10-CM

## 2021-03-16 DIAGNOSIS — F32A Depression, unspecified: Secondary | ICD-10-CM

## 2021-03-16 DIAGNOSIS — I152 Hypertension secondary to endocrine disorders: Secondary | ICD-10-CM

## 2021-03-16 DIAGNOSIS — K219 Gastro-esophageal reflux disease without esophagitis: Secondary | ICD-10-CM

## 2021-03-16 DIAGNOSIS — D414 Neoplasm of uncertain behavior of bladder: Secondary | ICD-10-CM

## 2021-03-16 DIAGNOSIS — E785 Hyperlipidemia, unspecified: Secondary | ICD-10-CM

## 2021-03-16 DIAGNOSIS — F331 Major depressive disorder, recurrent, moderate: Secondary | ICD-10-CM

## 2021-03-16 DIAGNOSIS — E1169 Type 2 diabetes mellitus with other specified complication: Secondary | ICD-10-CM

## 2021-03-16 NOTE — Patient Instructions (Addendum)
Visit Information  Patient Goals:  Manage Emotions.Manage Depression symptoms  Timeframe:  Short-Term Goal Priority:  Medium Progress: On Track Start Date:            03/16/21                 Expected End Date:          06/11/21             Follow Up Date 05/11/21 at 2:00 PM   Manage Emotions. Manage Depression symptoms    Why is this important?   When you are stressed, down or upset, your body reacts too.  For example, your blood pressure may get higher; you may have a headache or stomachache.  When your emotions get the best of you, your body's ability to fight off cold and flu gets weak.  These steps will help you manage your emotions.     Patient Self Care Activities:  Completes ADLs as able Attends scheduled medical appointments  Patient Coping Strengths:  Support received from his wife, [place] received from his son.  Patient Self Care Deficits:  Walking challenges  Patient Goals:  - spend time or talk with others at least 2 to 3 times per week - practice relaxation or meditation daily - keep a calendar with appointment dates  Follow Up Plan: LCSW to call client or spouse of client on 05/11/21 at 2:00 PM to assess client needs   [name].Kaysie Michelini MSW, LCSW Licensed Clinical Social Worker [facility] Care Management [phone]

## 2021-03-16 NOTE — Telephone Encounter (Signed)
[email or web address]

## 2021-03-16 NOTE — Chronic Care Management (AMB) (Signed)
Chronic Care Management    Clinical Social Work Note  03/16/2021 Name: Matthew Campos MRN: [id number] DOB: 27-Aug-1940  Matthew Campos is a 80 y.o. year old male who is a primary care patient of Matthew Norlander, DO. The CCM team was consulted to assist the patient with chronic disease management and/or care coordination needs related to: [employer] .   Engaged with patient /spouse of patient, Matthew Campos, by telephone for follow up visit in response to provider referral for social work chronic care management and care coordination services.   Consent to Services:  The patient was given information about Chronic Care Management services, agreed to services, and gave verbal consent prior to initiation of services.  Please see initial visit note for detailed documentation.   Patient agreed to services and consent obtained.   Assessment: Review of patient past medical history, allergies, medications, and health status, including review of relevant consultants reports was performed today as part of a comprehensive evaluation and provision of chronic care management and care coordination services.     SDOH (Social Determinants of Health) assessments and interventions performed:  SDOH Interventions    Flowsheet Row Most Recent Value  SDOH Interventions   Physical Activity Interventions Other (Comments)  [walking challenges. risk for falls]  Stress Interventions Provide Counseling  [client has stress related to managing medical needs]  Depression Interventions/Treatment  Currently on Treatment        Advanced Directives Status: See Vyn[employer] application for related entries.  CCM Care Plan  Allergies  Allergen Reactions   Codeine Nausea And Vomiting   Latex Hives and Itching   Lisinopril Cough    Outpatient Encounter Medications as of 03/16/2021  Medication Sig Note   acetaZOLAMIDE (DIAMOX) 250 MG tablet Take by mouth.    albuterol (PROVENTIL) (2.5 MG/3ML) 0.083%  nebulizer solution Take 2.5 mg by nebulization every 6 (six) hours as needed for wheezing or shortness of breath.    albuterol (VENTOLIN HFA) 108 (90 Base) MCG/ACT inhaler INHALE 2 PUFFS INTO THE LUNGS EVERY 6 HOURS AS NEEDED FOR WHEEZING OR SHORTNESS OF BREATH    aspirin EC 81 MG tablet Take 81 mg by mouth daily.    calcium carbonate (OSCAL) 1500 (600 Ca) MG TABS tablet Take by mouth 2 (two) times daily with a meal.    clopidogrel (PLAVIX) 75 MG tablet TAKE ONE (1) TABLET EACH DAY    dapagliflozin propanediol (FARXIGA) 10 MG TABS tablet TAKE ONE TABLET EACH MORNING BEFORE BREAKFAST    dextromethorphan-guaiFENesin (MUCINEX DM) 30-600 MG 12hr tablet Take 1 tablet by mouth 2 (two) times daily.    donepezil (ARICEPT) 5 MG tablet Take 5 mg (1 pill) daily for 4 weeks, then increase to 10 mg (2 pills) daily 03/01/2021: 03/01/21 approved as of 02/23/21   ferrous sulfate 325 (65 FE) MG EC tablet Take 325 mg by mouth every Monday, Wednesday, and Friday.     glipiZIDE (GLUCOTROL) 5 MG tablet Take 1 tablet (5 mg total) by mouth daily before breakfast.    levothyroxine (SYNTHROID) 100 MCG tablet TAKE ONE (1) TABLET EACH DAY    lubiprostone (AMITIZA) 24 MCG capsule TAKE 1 CAPSULE TWICE A DAY WITH MEALS.    mirtazapine (REMERON) 7.5 MG tablet Take 1 tablet (7.5 mg total) by mouth at bedtime. STOP LEXAPRO    Multiple Vitamin (MULTIVITAMIN WITH MINERALS) TABS Take 1 tablet by mouth daily.    nitroGLYCERIN (NITROSTAT) 0.4 MG SL tablet DISSOLVE 1 TAB UNDER TOUNGE FOR CHEST PAIN.  MAY REPEAT EVERY 5 MINUTES FOR 3 DOSES. IF NO RELIEF CALL 911 OR GO TO ER    ondansetron (ZOFRAN-ODT) 4 MG disintegrating tablet Take 1 tablet (4 mg total) by mouth every 8 (eight) hours as needed for nausea or vomiting.    oxyCODONE (ROXICODONE) 5 MG immediate release tablet Take 1 tablet (5 mg total) by mouth every 4 (four) hours as needed for severe pain or breakthrough pain.    pantoprazole (PROTONIX) 40 MG tablet TAKE ONE (1) TABLET EACH  DAY    rosuvastatin (CRESTOR) 10 MG tablet TAKE ONE (1) TABLET EACH DAY    Semaglutide (RYBELSUS) 7 MG TABS Take 7 mg by mouth daily.    sodium chloride (OCEAN) 0.65 % nasal spray Place into the nose.    spironolactone (ALDACTONE) 25 MG tablet Take 25 mg by mouth daily.     SYMBICORT 80-4.5 MCG/ACT inhaler Inhale 2 puffs into the lungs 2 (two) times daily as needed.    torsemide (DEMADEX) 20 MG tablet TAKE TWO TABLETS BY MOUTH TWICE DAILY    traZODone (DESYREL) 50 MG tablet TAKE 1/2 TO 1 TABLET AT BEDTIME AS NEEDED FOR SLEEP    No facility-administered encounter medications on file as of 03/16/2021.    Patient Active Problem List   Diagnosis Date Noted   Infected sebaceous cyst 01/30/2021   Viral upper respiratory tract infection 10/25/2020   Hyperlipidemia associated with type 2 diabetes mellitus ([place]) 02/04/2020   CAP (community acquired pneumonia) 09/07/2019   Hypomagnesemia 09/07/2019   CKD (chronic kidney disease), stage III ([place]) 09/07/2019   Volume overload 09/07/2019   Community acquired pneumonia 09/07/2019   GI bleeding 04/14/2019   Acute on chronic diastolic heart failure (HCC)    AKI (acute kidney injury) ([place])    Anemia due to blood loss    Bradycardia    Renal insufficiency    UGI bleed 04/13/2019   Chest pain 04/13/2019   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/13/2019   Cholecystostomy care ([place]) 04/13/2019   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   COPD (chronic obstructive pulmonary disease) (HCC)    Coronary artery disease    Chronic ischemic heart disease    Paroxysmal atrial fibrillation (HCC)    Cholecystitis    OSA (obstructive sleep apnea)    Coagulopathy (HCC)    Chronic anticoagulation    Dysphagia    Elevated alkaline phosphatase measurement    Elevated bilirubin    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   Sinus bradycardia 02/18/2019   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    Normocytic anemia 12/23/2018   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[place]) 12/23/2018   OSA (obstructive sleep apnea) 12/23/2018   Severe aortic stenosis 11/26/2018   Anxiety 10/28/2018   CHF (congestive heart failure) ([place]) 10/28/2018   At risk for falls 10/28/2018   Oxygen dependent 10/28/2018   Major depressive disorder, single episode, severe without psychotic features ([place]) 07/21/2017   Thrombocytopenia ([place]) 07/21/2017   COPD (chronic obstructive pulmonary disease) ([place]) 12/27/2015   CAD (coronary artery disease) 12/27/2015   Gastroesophageal reflux disease 12/27/2015   Hypertension associated with diabetes ([place]) 12/27/2015   Hyperlipidemia 09/09/2013   Type 2 diabetes mellitus ([place]) 09/09/2013    Conditions to be addressed/monitored: monitor client management of depression issues  Care Plan : LCSW Care Plan  Updates made by Katha Cabal, LCSW since 03/16/2021 12:00 AM     Problem: Emotional Distress      Goal: Emotional Health  Supported; Manage Depression symptoms faced   Start Date: 03/16/2021  Expected End Date: 06/11/2021  This Visit's Progress: On track  Recent Progress: On track  Priority: Medium  Note:   Current Barriers:  Mental Health needs related to depression and depression management Mobility issues Memory challenges Suicidal Ideation/Homicidal Ideation: No  Clinical Social Work Goal(s):  patient will work with SW monthly by telephone or in person to reduce or manage symptoms related to depression and depression management Client to call RNCM as needed in next 30 days to address nursing needs of client Patient will communicate with LCSW in next 30 days to discuss mobility challenges of client  Interventions: 1:1 collaboration with Matthew Norlander, DO regarding development and update of comprehensive plan of care as evidenced by provider attestation and co-signature Reviewed transport needs of client with [name], spouse of client. [name] helps  transport client as needed to appointments Reviewed family support  for [place] with [name].  [name] is supportive of client. Client's son is also supportive of client.  Discussed ambulation of client with [name].  [name] said client had appointment several months ago with Dr. Lajuana Ripple.  Also, client has follow up appointment with Dr. Lajuana Ripple on 04/19/21.  [name] said client has a scheduled appointment in [place], [place] on March 29, 2021 to discuss mobility needs. [name] said that client is hoping to get a motorized scooter. She said he fell recently and has potential for more falls. She said he shuffles his feet when walking, slides his feet when walking.  LCSW informed [name] that LCSW would contact [facility] Triage Nurse today and ask Triage Nurse to call her ([name]) to further discuss client appointment on 03/29/21 and to further discuss mobility needs of client. [name] agreed to this plan. Reviewed client oxygen use. [name] said client is using oxygen 24/7 continuously now.   Discussed client energy level. [name] said client has reduced energy Discussed client falls. [name] said client is using walker now; she said client fell yesterday but had no serious injury. She said his shoulder is sore now  Patient Self Care Activities:  Completes ADLs as able Attends scheduled medical appointments  Patient Coping Strengths:  Support received from his wife, [place] received from his son.  Patient Self Care Deficits:  Walking challenges  Patient Goals:  - spend time or talk with others at least 2 to 3 times per week - practice relaxation or meditation daily - keep a calendar with appointment dates  Follow Up Plan: LCSW to call client or spouse of client on 05/11/21 at 2:00 PM to assess client needs      [name].Geraldo Haris MSW, LCSW Licensed Clinical Social Worker [facility] Care Management [phone]

## 2021-03-16 NOTE — Patient Instructions (Signed)
Visit Information  Thank you for taking time to visit with me today. Please don't hesitate to contact me if I can be of assistance to you before our next scheduled telephone appointment.  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   Interventions: 1:1 collaboration with [name], DO regarding development and update of comprehensive plan of care as evidenced by provider attestation and co-signature Inter-disciplinary care team collaboration (see longitudinal plan of care) Comprehensive medication review performed; medication list updated in electronic medical record  Diabetes: Controlled; current treatment: Rybelsus 7mg , farxiga 10mg  , glipizide;  A1C IMPROVED TO 6.7% , GFR 44-->41 Denies personal and family history of Medullary thyroid cancer (MTC) Patient's wife reports blood sugars have improved 140-175. Counseled on diet.  He is taking rybelsus as prescribed.  Will continue 7mg  daily.  Patient has supply at home and back up RX was sent to the drug store.  Patient is now suffering from dementia (new start Aricept). Continue Farxiga 10mg  for [employer] and T2DM  Continue Rybelsus 7mg  daily  WOULD LIKE TO DROP THE GLIPIZIDE Current glucose readings: fasting glucose: 140-175;  post prandial glucose: <180 Denies hypoglycemic/hyperglycemic symptoms Discussed meal planning options and Plate method for healthy eating Avoid sugary drinks and desserts Incorporate balanced protein, non starchy veggies, 1 serving of carbohydrate with each meal Increase water intake Increase physical activity as able Current exercise: N/A Educated on MEDICATIONS (PURPOSE AND SIDE EFFECTS); diet/lifestyle  modifications Recommended continue current therapy Reviewed lipid panel/HLD-->continue rosuvastatin  Patient Goals/Self-Care Activities Over the next 90 days, patient will:  - take medications as prescribed check glucose DAILY (FASTING) OR IF SYMPTOMATIC, document, and provide at future appointments  Follow Up Plan: Telephone follow up appointment with care management team member scheduled for: 3 months   Please call the care guide team at [phone] if you need to cancel or reschedule your appointment.   The patient verbalized understanding of instructions, educational materials, and care plan provided today and declined offer to receive copy of patient instructions, educational materials, and care plan.    Regina Eck, PharmD, BCPS Clinical Pharmacist, [place]  [facility] Phone [phone]

## 2021-03-16 NOTE — Telephone Encounter (Signed)
Last referral note for neuro rehab was 11/9 and it said they left a VM for pt to call back.  They need to call them back for next steps.

## 2021-03-19 NOTE — Telephone Encounter (Signed)
LMTCB

## 2021-03-22 ENCOUNTER — Ambulatory Visit: Payer: Medicare Other | Admitting: Family Medicine

## 2021-03-23 ENCOUNTER — Emergency Department (HOSPITAL_COMMUNITY)
Admission: EM | Admit: 2021-03-23 | Discharge: 2021-03-23 | Disposition: A | Discharge status: Home / Self Care | Payer: Medicare Other | Attending: Emergency Medicine | Admitting: Emergency Medicine

## 2021-03-23 ENCOUNTER — Emergency Department (HOSPITAL_COMMUNITY): Payer: Medicare Other

## 2021-03-23 ENCOUNTER — Encounter (HOSPITAL_COMMUNITY): Payer: Self-pay | Admitting: *Deleted

## 2021-03-23 DIAGNOSIS — N183 Chronic kidney disease, stage 3 unspecified: Secondary | ICD-10-CM | POA: Insufficient documentation

## 2021-03-23 DIAGNOSIS — I2510 Atherosclerotic heart disease of native coronary artery without angina pectoris: Secondary | ICD-10-CM | POA: Insufficient documentation

## 2021-03-23 DIAGNOSIS — Z79899 Other long term (current) drug therapy: Secondary | ICD-10-CM | POA: Insufficient documentation

## 2021-03-23 DIAGNOSIS — Z87891 Personal history of nicotine dependence: Secondary | ICD-10-CM | POA: Insufficient documentation

## 2021-03-23 DIAGNOSIS — A09 Infectious gastroenteritis and colitis, unspecified: Secondary | ICD-10-CM

## 2021-03-23 DIAGNOSIS — Z91040 Latex allergy status: Secondary | ICD-10-CM | POA: Insufficient documentation

## 2021-03-23 DIAGNOSIS — Z8551 Personal history of malignant neoplasm of bladder: Secondary | ICD-10-CM | POA: Insufficient documentation

## 2021-03-23 DIAGNOSIS — Z20822 Contact with and (suspected) exposure to covid-19: Secondary | ICD-10-CM | POA: Insufficient documentation

## 2021-03-23 DIAGNOSIS — R1110 Vomiting, unspecified: Secondary | ICD-10-CM | POA: Diagnosis present

## 2021-03-23 DIAGNOSIS — K529 Noninfective gastroenteritis and colitis, unspecified: Secondary | ICD-10-CM | POA: Insufficient documentation

## 2021-03-23 DIAGNOSIS — I5033 Acute on chronic diastolic (congestive) heart failure: Secondary | ICD-10-CM | POA: Diagnosis not present

## 2021-03-23 DIAGNOSIS — Z7982 Long term (current) use of aspirin: Secondary | ICD-10-CM | POA: Insufficient documentation

## 2021-03-23 DIAGNOSIS — E1122 Type 2 diabetes mellitus with diabetic chronic kidney disease: Secondary | ICD-10-CM | POA: Insufficient documentation

## 2021-03-23 DIAGNOSIS — J449 Chronic obstructive pulmonary disease, unspecified: Secondary | ICD-10-CM | POA: Insufficient documentation

## 2021-03-23 DIAGNOSIS — I130 Hypertensive heart and chronic kidney disease with heart failure and stage 1 through stage 4 chronic kidney disease, or unspecified chronic kidney disease: Secondary | ICD-10-CM | POA: Insufficient documentation

## 2021-03-23 LAB — CBC WITH DIFFERENTIAL/PLATELET
Abs Immature Granulocytes: 0.13 10*3/uL — ABNORMAL HIGH (ref 0.00–0.07)
Basophils Absolute: 0 10*3/uL (ref 0.0–0.1)
Basophils Relative: 0 %
Eosinophils Absolute: 0 10*3/uL (ref 0.0–0.5)
Eosinophils Relative: 0 %
HCT: 41.7 % (ref 39.0–52.0)
Hemoglobin: 14 g/dL (ref 13.0–17.0)
Immature Granulocytes: 1 %
Lymphocytes Relative: 3 %
Lymphs Abs: 0.4 10*3/uL — ABNORMAL LOW (ref 0.7–4.0)
MCH: 34.3 pg — ABNORMAL HIGH (ref 26.0–34.0)
MCHC: 33.6 g/dL (ref 30.0–36.0)
MCV: 102.2 fL — ABNORMAL HIGH (ref 80.0–100.0)
Monocytes Absolute: 0.5 10*3/uL (ref 0.1–1.0)
Monocytes Relative: 5 %
Neutro Abs: 10.6 10*3/uL — ABNORMAL HIGH (ref 1.7–7.7)
Neutrophils Relative %: 91 %
Platelets: 119 10*3/uL — ABNORMAL LOW (ref 150–400)
RBC: 4.08 MIL/uL — ABNORMAL LOW (ref 4.22–5.81)
RDW: 14.5 % (ref 11.5–15.5)
WBC: 11.6 10*3/uL — ABNORMAL HIGH (ref 4.0–10.5)
nRBC: 0 % (ref 0.0–0.2)

## 2021-03-23 LAB — COMPREHENSIVE METABOLIC PANEL
ALT: 37 U/L (ref 0–44)
AST: 30 U/L (ref 15–41)
Albumin: 3.9 g/dL (ref 3.5–5.0)
Alkaline Phosphatase: 46 U/L (ref 38–126)
Anion gap: 11 (ref 5–15)
BUN: 43 mg/dL — ABNORMAL HIGH (ref 8–23)
CO2: 24 mmol/L (ref 22–32)
Calcium: 7.9 mg/dL — ABNORMAL LOW (ref 8.9–10.3)
Chloride: 97 mmol/L — ABNORMAL LOW (ref 98–111)
Creatinine, Ser: 1.91 mg/dL — ABNORMAL HIGH (ref 0.61–1.24)
GFR, Estimated: 35 mL/min — ABNORMAL LOW (ref >60)
Glucose, Bld: 298 mg/dL — ABNORMAL HIGH (ref 70–99)
Potassium: 3.8 mmol/L (ref 3.5–5.1)
Sodium: 132 mmol/L — ABNORMAL LOW (ref 135–145)
Total Bilirubin: 0.7 mg/dL (ref 0.3–1.2)
Total Protein: 7.2 g/dL (ref 6.5–8.1)

## 2021-03-23 LAB — LIPASE, BLOOD: Lipase: 60 U/L — ABNORMAL HIGH (ref 11–51)

## 2021-03-23 LAB — RESP PANEL BY RT-PCR (FLU A&B, COVID) ARPGX2
Influenza A by PCR: NEGATIVE
Influenza B by PCR: NEGATIVE
SARS Coronavirus 2 by RT PCR: NEGATIVE

## 2021-03-23 LAB — BRAIN NATRIURETIC PEPTIDE: B Natriuretic Peptide: 928 pg/mL — ABNORMAL HIGH (ref 0.0–100.0)

## 2021-03-23 MED ORDER — SODIUM CHLORIDE 0.9 % IV SOLN: INTRAVENOUS | Status: DC

## 2021-03-23 MED ORDER — ONDANSETRON 4 MG PO TBDP: ORAL_TABLET | ORAL | 0 refills | Status: DC

## 2021-03-23 NOTE — ED Triage Notes (Signed)
Diarrhea for the past 3 days, incontinent on arrival

## 2021-03-23 NOTE — Discharge Instructions (Signed)
Drink plenty of fluids.  Follow-up with your family doctor next week.  Return if any problem

## 2021-03-23 NOTE — ED Notes (Signed)
Spoke with pt spouse, Thersa Salt who says she is on way to pick up pt- informed spouse that pt will be waiting for her in the ED lobby

## 2021-03-23 NOTE — ED Provider Notes (Signed)
[facility] EMERGENCY DEPARTMENT Provider Note   CSN: [id number] Arrival date & time: 03/23/21  1100     History Chief Complaint  Patient presents with   Diarrhea    Matthew Campos is a 80 y.o. male.  Patient brought in by his wife.  Patient's wife said that yesterday started having vomiting.  Then had diarrhea all night long.  Triage nurse had marked down 3 days.  But does not sound like that is accurate.  No blood in the vomit no blood in the diarrhea.  Definitely been more diarrhea than the has been vomiting.  Patient not on any blood thinners.  Is on Aricept.  And is on Demadex.  Past medical history sniffing for hypertension diabetes coronary artery disease abdominal arctic aneurysm severe aortic stenosis had status post TAVR transcatheter aortic repair valve replacement.  And history of congestive heart failure COPD and loop recorder I believe.  Patient does not have a pacemaker pocket.  He denies having pacemaker.  EKG is a sort of wide-complex.      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    Anemia    Anxiety and depression    Aortic stenosis    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HCC)    CHF (congestive heart failure) (HCC)    Cholecystitis    Chronic ischemic heart disease    Complication of anesthesia    hard to be put to sleep   COPD (chronic obstructive pulmonary disease) ([place])    Coronary artery disease    Diabetes mellitus without complication ([place])    Diabetes mellitus without complication ([place])    takes Januvia and Metformin daily   GERD (gastroesophageal reflux disease)    GERD (gastroesophageal reflux disease)    takes omeprazole daily   Headache(784.0)    sinus   History of bronchitis    last time >22yrs ago   Hyperlipidemia    takes Lipitor daily   Hypertension    Hypertension    takes Metoprolol daily   Joint pain    Myocardial infarction ([place])    x  3;last one about 3-89yrs ago   Neoplasm of uncertain behavior of bladder    Obesity    OSA (obstructive sleep apnea)    Pacemaker    Pancytopenia (HCC)    Paroxysmal atrial fibrillation (HCC)    Pneumonia    last itme about 44yrs ago   S/P TAVR (transcatheter aortic valve replacement)    Severe aortic stenosis    Sinus bradycardia     Patient Active Problem List   Diagnosis Date Noted   Infected sebaceous cyst 01/30/2021   Viral upper respiratory tract infection 10/25/2020   Hyperlipidemia associated with type 2 diabetes mellitus ([place]) 02/04/2020   CAP (community acquired pneumonia) 09/07/2019   Hypomagnesemia 09/07/2019   CKD (chronic kidney disease), stage III ([place]) 09/07/2019   Volume overload 09/07/2019   Community acquired pneumonia 09/07/2019   GI bleeding 04/14/2019   Acute on chronic diastolic heart failure (HCC)    AKI (acute kidney injury) ([place])    Anemia due to blood loss    Bradycardia    Renal insufficiency    UGI bleed 04/13/2019   Chest pain 04/13/2019   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/13/2019   Cholecystostomy care ([place]) 04/13/2019   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   COPD (chronic obstructive pulmonary disease) ([place])  Coronary artery disease    Chronic ischemic heart disease    Paroxysmal atrial fibrillation (HCC)    Cholecystitis    OSA (obstructive sleep apnea)    Coagulopathy (HCC)    Chronic anticoagulation    Dysphagia    Elevated alkaline phosphatase measurement    Elevated bilirubin    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   Sinus bradycardia 02/18/2019   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   Normocytic anemia 12/23/2018   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[place]) 12/23/2018   OSA (obstructive sleep apnea) 12/23/2018   Severe aortic stenosis 11/26/2018   Anxiety 10/28/2018   CHF (congestive heart failure) ([place]) 10/28/2018   At risk for falls 10/28/2018   Oxygen  dependent 10/28/2018   Major depressive disorder, single episode, 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/21/2017   Thrombocytopenia ([place]) 07/21/2017   COPD (chronic obstructive pulmonary disease) ([place]) 12/27/2015   CAD (coronary artery disease) 12/27/2015   Gastroesophageal reflux disease 12/27/2015   Hypertension associated with diabetes ([place]) 12/27/2015   Hyperlipidemia 09/09/2013   Type 2 diabetes mellitus ([place]) 09/09/2013    Past Surgical History:  Procedure Laterality Date   abdominal aneurysm stenting     ABDOMINAL AORTIC ANEURYSM REPAIR     per patient stents placed about 4-5 years ago   AORTIC VALVE REPLACEMENT     stated it was done in November   cholecystostomy tube     CORONARY ANGIOPLASTY  2010   CORONARY ANGIOPLASTY WITH STENT PLACEMENT     about 30 years ago per patient   cyst removed from left wrist     ESOPHAGOGASTRODUODENOSCOPY (EGD) WITH PROPOFOL N/A 04/15/2019   Procedure: ESOPHAGOGASTRODUODENOSCOPY (EGD) WITH PROPOFOL;  Surgeon: Danie Binder, MD;  Location[facility];  Service: Endoscopy;  Laterality: N/A;  possible dilation   HERNIA REPAIR     left shoulder surgery     POLYPECTOMY  04/15/2019   Procedure: POLYPECTOMY;  Surgeon: Danie Binder, MD;  Location: [facility] END[facility];  Service: Endoscopy;;  duodenal    SHOULDER ARTHROSCOPY WITH ROTATOR CUFF REPAIR AND SUBACROMIAL DECOMPRESSION  04/17/2012   Procedure: SHOULDER ARTHROSCOPY WITH ROTATOR CUFF REPAIR AND SUBACROMIAL DECOMPRESSION;  Surgeon: Yvette Rack., MD;  Location: [facility];  Service: Orthopedics;  Laterality: Right;  RIGHT SHOULDER ROTATOR CUFF REPAIR INCLUDING ACROMIOPLASTY CHRONIC, ARTHROSCOPY SHOULDER DEBRIDEMENT EXTENSIVE   TONSILLECTOMY     VALVE REPLACEMENT         Family History  Problem Relation Age of Onset   Cancer Mother        Unknown type   Heart disease Father    Diabetes Father    Heart disease Sister    Seizures Brother    Diabetes Daughter    Diabetes Daughter     Heart attack Son    Colon cancer Mother        in her 12s.    Stomach cancer Neg Hx    Esophageal cancer Neg Hx     Social History   Tobacco Use   Smoking status: Former   Smokeless tobacco: Never   Tobacco comments:    quit smoking 20+yrs ago  Vaping Use   Vaping Use: Never used  Substance Use Topics   Alcohol use: No    Comment: last drank about 2-3 years ago   Drug use: No    Home Medications Prior to Admission medications   Medication Sig Start Date End Date Taking? Authorizing Provider  acetaZOLAMIDE (DIAMOX) 250 MG  tablet Take by mouth. 05/14/19 02/15/21  [provider]  albuterol (PROVENTIL) (2.5 MG/3ML) 0.083% nebulizer solution Take 2.5 mg by nebulization every 6 (six) hours as needed for wheezing or shortness of breath.    [provider]  albuterol (VENTOLIN HFA) 108 (90 Base) MCG/ACT inhaler INHALE 2 PUFFS INTO THE LUNGS EVERY 6 HOURS AS NEEDED FOR WHEEZING OR SHORTNESS OF BREATH 01/29/21   Ronnie Doss M, DO  aspirin EC 81 MG tablet Take 81 mg by mouth daily.    [provider]  calcium carbonate (OSCAL) 1500 (600 Ca) MG TABS tablet Take by mouth 2 (two) times daily with a meal.    [provider]  clopidogrel (PLAVIX) 75 MG tablet TAKE ONE (1) TABLET EACH DAY 01/01/21   Ronnie Doss M, DO  dapagliflozin propanediol (FARXIGA) 10 MG TABS tablet TAKE ONE TABLET EACH MORNING BEFORE BREAKFAST 01/15/21   Ronnie Doss M, DO  dextromethorphan-guaiFENesin (MUCINEX DM) 30-600 MG 12hr tablet Take 1 tablet by mouth 2 (two) times daily. 10/25/20   Ivy Lynn, NP  donepezil (ARICEPT) 5 MG tablet Take 5 mg (1 pill) daily for 4 weeks, then increase to 10 mg (2 pills) daily 02/19/21   Genia Harold, MD  ferrous sulfate 325 (65 FE) MG EC tablet Take 325 mg by mouth every Monday, Wednesday, and Friday.  06/18/19   [provider]  glipiZIDE (GLUCOTROL) 5 MG tablet Take 1 tablet (5 mg total) by mouth daily before breakfast.  01/15/21   Ronnie Doss M, DO  levothyroxine (SYNTHROID) 100 MCG tablet TAKE ONE (1) TABLET EACH DAY 06/15/20   Gottschalk, Ashly M, DO  lubiprostone (AMITIZA) 24 MCG capsule TAKE 1 CAPSULE TWICE A DAY WITH MEALS. 01/29/21   [name], DO  mirtazapine (REMERON) 7.5 MG tablet Take 1 tablet (7.5 mg total) by mouth at bedtime. STOP LEXAPRO 01/15/21   [name], DO  Multiple Vitamin (MULTIVITAMIN WITH MINERALS) TABS Take 1 tablet by mouth daily.    [provider]  nitroGLYCERIN (NITROSTAT) 0.4 MG SL tablet DISSOLVE 1 TAB UNDER TOUNGE FOR CHEST PAIN. MAY REPEAT EVERY 5 MINUTES FOR 3 DOSES. IF NO RELIEF CALL 911 OR GO TO ER 02/26/21   Ronnie Doss M, DO  ondansetron (ZOFRAN-ODT) 4 MG disintegrating tablet Take 1 tablet (4 mg total) by mouth every 8 (eight) hours as needed for nausea or vomiting. 02/21/21   Dettinger, Fransisca Kaufmann, MD  oxyCODONE (ROXICODONE) 5 MG immediate release tablet Take 1 tablet (5 mg total) by mouth every 4 (four) hours as needed for severe pain or breakthrough pain. 01/30/21   Virl Cagey, MD  pantoprazole (PROTONIX) 40 MG tablet TAKE ONE (1) TABLET EACH DAY 02/26/21   Ronnie Doss M, DO  rosuvastatin (CRESTOR) 10 MG tablet TAKE ONE (1) TABLET EACH DAY 11/01/20   Gottschalk, Ashly M, DO  Semaglutide (RYBELSUS) 7 MG TABS Take 7 mg by mouth daily. 01/15/21   [name], DO  sodium chloride (OCEAN) 0.65 % nasal spray Place into the nose.    [provider]  spironolactone (ALDACTONE) 25 MG tablet Take 25 mg by mouth daily.  05/15/19 09/07/19  [provider]  SYMBICORT 80-4.5 MCG/ACT inhaler Inhale 2 puffs into the lungs 2 (two) times daily as needed. 10/28/18   [provider]  torsemide (DEMADEX) 20 MG tablet TAKE TWO TABLETS BY MOUTH TWICE DAILY 01/29/21   Ronnie Doss M, DO  traZODone (DESYREL) 50 MG tablet TAKE 1/2 TO 1  TABLET AT BEDTIME AS NEEDED FOR SLEEP 10/09/20   Ronnie Doss M, DO     Allergies    Codeine, Latex, and Lisinopril  Review of Systems   Review of Systems  Constitutional:  Negative for chills and fever.  HENT:  Negative for ear pain and sore throat.   Eyes:  Negative for pain and visual disturbance.  Respiratory:  Negative for cough and shortness of breath.   Cardiovascular:  Negative for chest pain and palpitations.  Gastrointestinal:  Positive for abdominal pain, diarrhea, nausea and vomiting.  Genitourinary:  Negative for dysuria and hematuria.  Musculoskeletal:  Negative for arthralgias and back pain.  Skin:  Negative for color change and rash.  Neurological:  Negative for seizures and syncope.  All other systems reviewed and are negative.  Physical Exam Updated Vital Signs BP 100/73   Pulse 70   Temp 97.7 F (36.5 C) (Oral)   Resp 16   SpO2 96%   Physical Exam Vitals and nursing note reviewed.  Constitutional:      General: He is not in acute distress.    Appearance: Normal appearance. He is well-developed.  HENT:     Head: Normocephalic and atraumatic.  Eyes:     Extraocular Movements: Extraocular movements intact.     Conjunctiva/sclera: Conjunctivae normal.     Pupils: Pupils are equal, round, and reactive to light.  Cardiovascular:     Rate and Rhythm: Normal rate and regular rhythm.     Heart sounds: No murmur heard. Pulmonary:     Effort: Pulmonary effort is normal. No respiratory distress.     Breath sounds: Normal breath sounds.  Abdominal:     General: There is no distension.     Palpations: Abdomen is soft.     Tenderness: There is no abdominal tenderness. There is no guarding.  Musculoskeletal:        General: No swelling.     Cervical back: Normal range of motion and neck supple.  Skin:    General: Skin is warm and dry.     Capillary Refill: Capillary refill takes less than 2 seconds.  Neurological:     General: No focal deficit present.     Mental Status: He is alert and oriented to person, place, and time.      Cranial Nerves: No cranial nerve deficit.     Sensory: No sensory deficit.     Motor: No weakness.  Psychiatric:        Mood and Affect: Mood normal.    ED Results / Procedures / Treatments   Labs (all labs ordered are listed, but only abnormal results are displayed) Labs Reviewed  COMPREHENSIVE METABOLIC PANEL - Abnormal; Notable for the following components:      Result Value   Sodium 132 (*)    Chloride 97 (*)    Glucose, Bld 298 (*)    BUN 43 (*)    Creatinine, Ser 1.91 (*)    Calcium 7.9 (*)    GFR, Estimated 35 (*)    All other components within normal limits  CBC WITH DIFFERENTIAL/PLATELET - Abnormal; Notable for the following components:   WBC 11.6 (*)    RBC 4.08 (*)    MCV 102.2 (*)    MCH 34.3 (*)    Platelets 119 (*)    Neutro Abs 10.6 (*)    Lymphs Abs 0.4 (*)    Abs Immature Granulocytes 0.13 (*)    All other components within normal limits  LIPASE,  BLOOD - Abnormal; Notable for the following components:   Lipase 60 (*)    All other components within normal limits  RESP PANEL BY RT-PCR (FLU A&B, COVID) [id number]    EKG EKG Interpretation  Date/Time:  Friday March 23 2021 13:04:30 EST Ventricular Rate:  79 PR Interval:  156 QRS Duration: 182 QT Interval:  472 QTC Calculation: 542 R Axis:   -77 Text Interpretation: Sinus rhythm Multiple ventricular premature complexes Nonspecific IVCD with LAD Left ventricular hypertrophy Confirmed by Fredia Sorrow [phone]) on 03/23/2021 1:11:38 PM  Radiology DG Chest Port 1 View  Result Date: 03/23/2021 CLINICAL DATA:  Flu-like symptoms, diarrhea EXAM: PORTABLE CHEST 1 VIEW COMPARISON:  09/06/2019 FINDINGS: Cardiomegaly implantable loop recorder. Pulmonary vascular prominence and mild diffuse interstitial opacity. The visualized skeletal structures are unremarkable. IMPRESSION: Cardiomegaly with pulmonary vascular prominence and mild diffuse interstitial opacity, likely edema. No focal airspace opacity.  Electronically Signed   By: Delanna Ahmadi M.D.   On: 03/23/2021 12:58    Procedures Procedures   Medications Ordered in ED Medications  0.9 %  sodium chloride infusion ( Intravenous New Bag/Given 03/23/21 1323)    ED Course  I have reviewed the triage vital signs and the nursing notes.  Pertinent labs & imaging results that were available during my care of the patient were reviewed by me and considered in my medical decision making (see chart for details).    MDM Rules/Calculators/A&P                           Patient right now feeling better.  But due to all that diarrhea and his age in fact he had some abdominal pain feel that he needs CT scan abdomen.  Patient's electrolytes show blood sugar 298.  Patient has not anything to eat today however.  And these were all done at about 1:00's afternoon sodium down a little bit 132 chloride 97 but okay BUN 43 creatinine 1.91 for a GFR 35.  So CT scan have to be done without contrast.  White blood cell count shows mild leukocytosis at 11.6.  Hemoglobin is 14.0.  Platelets under 19 K.  Lipase up some at 60.  COVID testing and flu testing negative chest x-ray cardiomegaly with some pulmonary vascular prominence mild diffuse interstitial opacity likely some edema.  No focal airspace opacity.  We will also add on BNP.  In October patient's creatinine was 1.67.  So he is showing some evidence of dehydration.  But he is receiving some fluids here gently.  Dispositions mostly going to be based on that CT scan of the abdomen.  Clinically this sounds like this was probably a gastro enteritis. Final Clinical Impression(s) / ED Diagnoses Final diagnoses:  Gastroenteritis    Rx / DC Orders ED Discharge Orders     None        Fredia Sorrow, MD 03/23/21 [phone]

## 2021-03-24 ENCOUNTER — Other Ambulatory Visit: Payer: Self-pay | Admitting: Family Medicine

## 2021-03-24 DIAGNOSIS — J449 Chronic obstructive pulmonary disease, unspecified: Secondary | ICD-10-CM

## 2021-03-24 DIAGNOSIS — F5101 Primary insomnia: Secondary | ICD-10-CM

## 2021-03-26 ENCOUNTER — Ambulatory Visit (INDEPENDENT_AMBULATORY_CARE_PROVIDER_SITE_OTHER): Payer: Medicare Other | Admitting: Nurse Practitioner

## 2021-03-26 ENCOUNTER — Telehealth: Payer: Self-pay | Admitting: Family Medicine

## 2021-03-26 ENCOUNTER — Encounter: Payer: Self-pay | Admitting: Nurse Practitioner

## 2021-03-26 DIAGNOSIS — R197 Diarrhea, unspecified: Secondary | ICD-10-CM | POA: Insufficient documentation

## 2021-03-26 MED ORDER — LOPERAMIDE HCL 2 MG PO CAPS: 4.0000 mg | ORAL_CAPSULE | ORAL | 1 refills | Status: DC | PRN

## 2021-03-26 NOTE — Patient Instructions (Signed)
Diarrhea, Adult °Diarrhea is when you pass loose and watery poop (stool) often. Diarrhea can make you feel weak and cause you to lose water in your body (get dehydrated). Losing water in your body can cause you to: °Feel tired and thirsty. °Have a dry mouth. °Go pee (urinate) less often. °Diarrhea often lasts 2-3 days. However, it can last longer if it is a sign of something more serious. It is important to treat your diarrhea as told by your doctor. °Follow these instructions at home: °Eating and drinking °  °Follow these instructions as told by your doctor: °Take an ORS (oral rehydration solution). This is a drink that helps you replace fluids and minerals your body lost. It is sold at pharmacies and stores. °Drink plenty of fluids, such as: °Water. °Ice chips. °Diluted fruit juice. °Low-calorie sports drinks. °Milk, if you want. °Avoid drinking fluids that have a lot of sugar or caffeine in them. °Eat bland, easy-to-digest foods in small amounts as you are able. These foods include: °Bananas. °Applesauce. °Rice. °Low-fat (lean) meats. °Toast. °Crackers. °Avoid alcohol. °Avoid spicy or fatty foods. ° °Medicines °Take over-the-counter and prescription medicines only as told by your doctor. °If you were prescribed an antibiotic medicine, take it as told by your doctor. Do not stop using the antibiotic even if you start to feel better. °General instructions ° °Wash your hands often using soap and water. If soap and water are not available, use a hand sanitizer. Others in your home should wash their hands as well. Hands should be washed: °After using the toilet or changing a diaper. °Before preparing, cooking, or serving food. °While caring for a sick person. °While visiting someone in a hospital. °Drink enough fluid to keep your pee (urine) pale yellow. °Rest at home while you get better. °Take a warm bath to help with any burning or pain from having diarrhea. °Watch your condition for any changes. °Keep all  follow-up visits as told by your doctor. This is important. °Contact a doctor if: °You have a fever. °Your diarrhea gets worse. °You have new symptoms. °You cannot keep fluids down. °You feel light-headed or dizzy. °You have a headache. °You have muscle cramps. °Get help right away if: °You have chest pain. °You feel very weak or you pass out (faint). °You have bloody or black poop or poop that looks like tar. °You have very bad pain, cramping, or bloating in your belly (abdomen). °You have trouble breathing or you are breathing very quickly. °Your heart is beating very quickly. °Your skin feels cold and clammy. °You feel confused. °You have signs of losing too much water in your body, such as: °Dark pee, very little pee, or no pee. °Cracked lips. °Dry mouth. °Sunken eyes. °Sleepiness. °Weakness. °Summary °Diarrhea is when you pass loose and watery poop (stool) often. °Diarrhea can make you feel weak and cause you to lose water in your body (get dehydrated). °Take an ORS (oral rehydration solution). This is a drink that is sold at pharmacies and stores. °Eat bland, easy-to-digest foods in small amounts as you are able. °Contact a doctor if your condition gets worse. Get help right away if you have signs that you have lost too much water in your body. °This information is not intended to replace advice given to you by your health care provider. Make sure you discuss any questions you have with your health care provider. °Document Revised: 10/11/2020 Document Reviewed: 10/11/2020 °Elsevier Patient Education © 2022 [employer] ° °

## 2021-03-26 NOTE — Progress Notes (Signed)
   Virtual Visit  Note Due to COVID-19 pandemic this visit was conducted virtually. This visit type was conducted due to national recommendations for restrictions regarding the COVID-19 Pandemic (e.g. social distancing, sheltering in place) in an effort to limit this patient's exposure and mitigate transmission in our community. All issues noted in this document were discussed and addressed.  A physical exam was not performed with this format.  I connected with Matthew Campos on 03/26/21 at 10:30 AM by telephone and verified that I am speaking with the correct person using two identifiers. Matthew Campos is currently located at home with spouse present during visit. The provider, Ivy Lynn, NP is located in their office at time of visit.  I discussed the limitations, risks, security and privacy concerns of performing an evaluation and management service by telephone and the availability of in person appointments. I also discussed with the patient that there may be a patient responsible charge related to this service. The patient expressed understanding and agreed to proceed.   History and Present Illness:  Diarrhea  This is a recurrent problem. The current episode started in the past 7 days. The problem occurs 5 to 10 times per day. The stool consistency is described as Watery. The patient states that diarrhea awakens him from sleep. Associated symptoms include coughing. Pertinent negatives include no chills or fever. Nothing aggravates the symptoms. There are no known risk factors. He has tried bismuth subsalicylate and electrolyte solution for the symptoms. The treatment provided mild relief. There is no history of malabsorption.     Review of Systems  Constitutional: Negative.  Negative for chills and fever.  HENT: Negative.    Eyes: Negative.   Respiratory:  Positive for cough.   Cardiovascular: Negative.   Gastrointestinal:  Positive for diarrhea.  Skin: Negative.  Negative for  rash.    Observations/Objective: Televisit patient not in distress assessment and Plan:  Take medication as prescribed Imodium 4 mg capsule by mouth as needed for diarrhea, followed by 2 mg after each loose stool for maximum of 60 mg/day.  Increase hydration and electrolyte, brat diet recommended.  Follow Up Instructions: Follow-up with worsening unresolved symptoms.    I discussed the assessment and treatment plan with the patient. The patient was provided an opportunity to ask questions and all were answered. The patient agreed with the plan and demonstrated an understanding of the instructions.   The patient was advised to call back or seek an in-person evaluation if the symptoms worsen or if the condition fails to improve as anticipated.  The above assessment and management plan was discussed with the patient. The patient verbalized understanding of and has agreed to the management plan. Patient is aware to call the clinic if symptoms persist or worsen. Patient is aware when to return to the clinic for a follow-up visit. Patient educated on when it is appropriate to go to the emergency department.   Time call ended: 10:40 AM  I provided 10 minutes of  non face-to-face time during this encounter.    Ivy Lynn, NP

## 2021-03-26 NOTE — Assessment & Plan Note (Signed)
Take medication as prescribed Imodium 4 mg capsule by mouth as needed for diarrhea, followed by 2 mg after each loose stool for maximum of 60 mg/day.  Increase hydration and electrolyte, brat diet recommended.

## 2021-03-29 ENCOUNTER — Ambulatory Visit: Payer: Medicare Other | Attending: Family Medicine

## 2021-03-29 ENCOUNTER — Other Ambulatory Visit: Payer: Self-pay

## 2021-03-29 DIAGNOSIS — M6281 Muscle weakness (generalized): Secondary | ICD-10-CM | POA: Insufficient documentation

## 2021-03-29 NOTE — Therapy (Addendum)
[facility] [address] [place], [place], [address] Phone: [phone]   Fax:  [phone]  Physical Therapy Evaluation  Patient Details  Name: Matthew Campos MRN: [id number] Date of Birth: 08/03/1940 Referring Provider (PT): Adam Phenix   Encounter Date: 03/29/2021   PT End of Session - 03/29/21 1314     Visit Number 1    Number of Visits 1    PT Start Time 75    PT Stop Time 40    PT Time Calculation (min) 50 min    Equipment Utilized During Treatment --   RW and trialed powerchair with joystick   Activity Tolerance Patient tolerated treatment well    Behavior During Therapy WFL for tasks assessed/performed             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    Anemia    Anxiety and depression    Aortic stenosis    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HCC)    CHF (congestive heart failure) ([place])    Cholecystitis    Chronic ischemic heart disease    Complication of anesthesia    hard to be put to sleep   COPD (chronic obstructive pulmonary disease) ([place])    Coronary artery disease    Diabetes mellitus without complication ([place])    Diabetes mellitus without complication ([place])    takes Januvia and Metformin daily   GERD (gastroesophageal reflux disease)    GERD (gastroesophageal reflux disease)    takes omeprazole daily   Headache(784.0)    sinus   History of bronchitis    last time >79yrs ago   Hyperlipidemia    takes Lipitor daily   Hypertension    Hypertension    takes Metoprolol daily   Joint pain    Myocardial infarction ([place])    x 3;last one about 3-71yrs ago   Neoplasm of uncertain behavior of bladder    Obesity    OSA (obstructive sleep apnea)    Pacemaker    Pancytopenia (HCC)    Paroxysmal atrial fibrillation (HCC)    Pneumonia    last itme about 1yrs ago   S/P TAVR  (transcatheter aortic valve replacement)    Severe aortic stenosis    Sinus bradycardia     Past Surgical History:  Procedure Laterality Date   abdominal aneurysm stenting     ABDOMINAL AORTIC ANEURYSM REPAIR     per patient stents placed about 4-5 years ago   AORTIC VALVE REPLACEMENT     stated it was done in November   cholecystostomy tube     CORONARY ANGIOPLASTY  2010   CORONARY ANGIOPLASTY WITH STENT PLACEMENT     about 30 years ago per patient   cyst removed from left wrist     ESOPHAGOGASTRODUODENOSCOPY (EGD) WITH PROPOFOL N/A 04/15/2019   Procedure: ESOPHAGOGASTRODUODENOSCOPY (EGD) WITH PROPOFOL;  Surgeon: Danie Binder, MD;  [facility];  Service: Endoscopy;  Laterality: N/A;  possible dilation   HERNIA REPAIR     left shoulder surgery     POLYPECTOMY  04/15/2019   Procedure: POLYPECTOMY;  Surgeon: Danie Binder, MD;  Location: [facility];  Service: Endoscopy;;  duodenal    SHOULDER ARTHROSCOPY WITH ROTATOR CUFF REPAIR AND SUBACROMIAL DECOMPRESSION  04/17/2012   Procedure: SHOULDER ARTHROSCOPY WITH ROTATOR CUFF REPAIR AND SUBACROMIAL DECOMPRESSION;  Surgeon: Yvette Rack.,  MD;  Location: [facility];  Service: Orthopedics;  Laterality: Right;  RIGHT SHOULDER ROTATOR CUFF REPAIR INCLUDING ACROMIOPLASTY CHRONIC, ARTHROSCOPY SHOULDER DEBRIDEMENT EXTENSIVE   TONSILLECTOMY     VALVE REPLACEMENT      There were no vitals filed for this visit.    Subjective Assessment - 03/29/21 1315     Subjective Pt presents for power w/c evaluation. Reports that he has a little scooter but turns over whenever he is in yard. Wife had purchased that one. He has history of multiple falls and is on 2L oxygen with history of COPD and MIs.                [facility] PT Assessment - 03/29/21 1428       Assessment   Medical Diagnosis impaired mobility and endurance    Referring Provider (PT) Adam Phenix    Onset Date/Surgical Date 01/17/21    Hand Dominance Right       Precautions   Precautions Fall      Prior Function   Level of Independence Needs assistance with ADLs    Vocation Retired                        Objective measurements completed on examination: See above findings.           Mobility/Seating Evaluation    PATIENT INFORMATION: Name: Matthew Campos DOB: 01-23-1941  Sex: m Date seen: 03/29/21 Time: 1:15p  Address:  [address] [place], [place] [address] Physician: Ronnie Doss This evaluation/justification form will serve as the LMN for the following suppliers: __________________________ Supplier: [employer] Contact Person: Deberah Pelton Phone:  [phone]   Seating Therapist: Cherly Anderson, PT Phone:   [phone]   Phone: [phone]    Spouse/Parent/Caregiver name: Chan Sheahan  Phone number: [phone] Insurance/Payer: [employer]     Reason for Referral: evaluation for motorized w/c  Patient/Caregiver Goals: To obtain a power w/c to be safer in home and more independent.  Patient was seen for face-to-face evaluation for new power wheelchair.  Also present was Deberah Pelton to discuss recommendations and wheelchair options.  Further paperwork was completed and sent to vendor.  Patient appears to qualify for power mobility device at this time per objective findings.   MEDICAL HISTORY: Diagnosis: Primary Diagnosis: Type 2 diabetes mellitus with stage 3a chronic kidney disease, without long-term current use of insulin  Onset:  Diagnosis: abnormal MMSE, impaired mobility and endurance   '[]'$ Progressive Disease Relevant past and future surgeries: Pt has history of 3 MIs, heart surgery, COPD on oxygen 2L all the time at home but did not bring his portable tank to clinic. He had aorta surgery, 2 stents 3 years ago.    Height: $Remove'5\' 8"'tWQsMNm$  Weight: 190 Explain recent changes or trends in weight:    History including Falls: Pt has had multiple falls. Falls every 1-2 weeks. Reports he is unsteady and weak.  Shuffles feet.    HOME ENVIRONMENT: $RemoveBeforeDEI'[x]'ipiCYjhDxHIooEio$ House  $Remo'[]'aAdsi$ Condo/town home  $Rem'[]'iHQr$ Apartment  $RemoveBe'[]'XjtdpTzGk$ Assisted Living    '[]'$ Lives Alone $RemoveBef'[x]'nrQYuLDyUh$  Lives with Others  Hours with caregiver: lives with wife  [] Home is accessible to patient           Stairs      [] Yes []  No     Ramp [x] Yes [] No Comments:  Pt reports doors about 31 inches.   COMMUNITY ADL: TRANSPORTATION: [x] Car    [] Van    [] Public Transportation    [] Adapted w/c Lift    [] Ambulance    [] Other:       [] Sits in wheelchair during transport  Employment/School:  Specific requirements pertaining to mobility   Other:     FUNCTIONAL/SENSORY PROCESSING SKILLS:  Handedness:   [x] Right     [] Left    [] NA  Comments:    Functional Processing Skills for Wheeled Mobility [x] Processing Skills are adequate for safe wheelchair operation  Areas of concern than may interfere with safe operation of wheelchair Description of problem   []  Attention to environment      [] Judgment      []  Hearing  []  Vision or visual processing      [] Motor Planning  []  Fluctuations in Behavior      VERBAL COMMUNICATION: [x] WFL receptive [x]  WFL expressive [x] Understandable  [] Difficult to understand  [] non-communicative []  Uses an augmented communication device  CURRENT SEATING / MOBILITY: Current Mobility Base:  [] None [] Dependent [] Manual [x] Scooter [] Power  Type of Control:   Manufacturer:  Size:  Age: has had a couple years that wife had purchased from neighbor  Current Condition of Mobility Base:     Current Wheelchair components:    Describe posture in present seating system:  Did not bring to evaluation.      SENSATION and SKIN ISSUES: Sensation [x] Intact  [] Impaired [] Absent  Level of sensation: does report tingling in feet in morning Pressure Relief: Able to perform effective pressure relief :    [x] Yes  []  No Method: stand up If not, Why?:   Skin Issues/Skin Integrity Current Skin  Issues  [] Yes [x] No [] Intact []  Red area[]  Open Area  [] Scar Tissue [] At risk from prolonged sitting Where    History of Skin Issues  [] Yes [x] No Where   When    Hx of skin flap surgeries  [] Yes [x] No Where   When    Limited sitting tolerance [] Yes [] No Hours spent sitting in wheelchair daily:   Complaint of Pain:  Please describe: Pt reports low back pain. 0/10 currently but worse first thing in morning and sometimes throughout day up to 8/10   Swelling/Edema: no swelling lately but does get swelling from time to time in legs. Takes fluid pills to help.    ADL STATUS (in reference to wheelchair use):  Indep Assist Unable Indep with Equip Not assessed Comments  Dressing []  [x]  []  []  []  wife assists with lower body dressing  Eating [x]  []  []  []  []    Toileting [x]  []  []  []  []    Bathing []  [x]  []  []  []  wife assists and uses shower chair  Grooming/Hygiene [x]  []  []  []  []    Meal Prep []  [x]  []  []  []  wife assist with most of the meals  IADLS []  [x]  []  []  []    Bowel Management: [x] Continent  [] Incontinent  [] Accidents Comments:    Bladder Management: [] Continent  [x] Incontinent  [] Accidents Comments:  wears a diaper     WHEELCHAIR SKILLS: Manual w/c Propulsion: [] UE or LE strength and endurance sufficient to participate in ADLs using manual wheelchair Arm : [] left [] right   [] Both      Distance:  Foot:  []   left [] right   [] Both  Operate Scooter: []  Strength, hand grip, balance and transfer appropriate for use [] Living environment is accessible for use of scooter  Operate Power w/c:  [x]  Std. Joystick   []  Alternative Controls Indep [x]  Assist []  Dependent/unable []  N/A []   [x] Safe          [x]  Functional      Distance: 150  Bed confined without wheelchair []  Yes [x]  No   STRENGTH/RANGE OF MOTION:   Range of Motion Strength  Shoulder  4/5 shoulder flexion  Elbow  4/5 elbow flexion/extension  Wrist/Hand  WFL  Hip  hip flexion 3+/5 bilateral  Knee  4/5 bilateral knee flexion/ext   Ankle  4/5 DF     MOBILITY/BALANCE:  []  Patient is totally dependent for mobility      Balance Transfers Ambulation  Sitting Balance: Standing Balance: []  Independent []  Independent/Modified Independent  [x]  WFL     []  WFL [x]  Supervision [x]  Supervision  []  Uses UE for balance  []  Supervision []  Min Assist [x]  Ambulates with Assist  RW and close SBA/CGA    []  Min Assist [x]  Min assist []  Mod Assist []  Ambulates with Device:      [x]  RW  []  StW  []  Cane  []    []  Mod Assist []  Mod assist []  Max assist   []  Max Assist []  Max assist []  Dependent []  Indep. Short Distance Only  []  Unable []  Unable []  Lift / Sling Required Distance (in feet)  20' TUG=29 sec   []  Sliding board []  Unable to Ambulate (see explanation below)  Cardio Status:  [] Intact  [x]  Impaired   []  NA     History of MIs, aorta surgery, stents. Rest HR=70,O2=94%, BP=120/70. After TUG HR=76, O2=88%  Respiratory Status:  [] Intact   [x] Impaired   [] NA     Pt has COPD. Uses 2L oxygen at home. Desats to 88% with activity without.  Orthotics/Prosthetics:   Comments (Address manual vs power w/c vs scooter): Pt has had multiple falls with trying to utilize walker falling almost weekly. He fatigues quickly with gait short distances with RW and needs supervision/ CGA for safety. He also desats to 88% with ambulating 20' at evaluation.Is on 2L oxygen. His timed up and go score of 29 sec indicates high fall risk. Pt has history of MIs with heart surgery and stents as well as COPD which would not allow him to propel a manual wheelchair safely. Pt is unsafe to utilize scooter. He has had multiple falls on scooter his wife purchased for him tipping it over. Pt would benefit from power wheelchair to improve access in his home to allow him to be more independent with MRADLs as well as improve his safety. He was able to safely propel a trial powerchair with joystick in clinic on right.           Anterior / Posterior Obliquity Rotation-Pelvis    PELVIS    [x]  []  []   Neutral Posterior Anterior  [x]  []  []   WFL Rt elev Lt elev  [x]  []  []   WFL Right Left                      Anterior    Anterior     []  Fixed []  Other []  Partly Flexible []  Flexible   []  Fixed []  Other []  Partly Flexible  []  Flexible  []  Fixed []  Other []  Partly Flexible  []  Flexible   TRUNK  [x]  []  []   [facility]  Thoracic  Lumbar  Kyphosis Lordosis  [x]  []  []   WFL Convex Convex  Right Left [] c-curve [] s-curve [] multiple  [x]  Neutral []  Left-anterior []  Right-anterior     []  Fixed []  Flexible []  Partly Flexible []  Other  []  Fixed []  Flexible []  Partly Flexible []  Other  []  Fixed             []  Flexible []  Partly Flexible []  Other    Position Windswept    HIPS          [x]            []               []    Neutral       Abduct        ADduct         [x]           []            []   Neutral Right           Left      []  Fixed []  Subluxed []  Partly Flexible []  Dislocated []  Flexible  []  Fixed []  Other []  Partly Flexible  []  Flexible                 Foot Positioning Knee Positioning      [x]  WFL  [] Lt [] Rt [x]  WFL  [] Lt [] Rt    KNEES ROM concerns: ROM concerns:    & Dorsi-Flexed [] Lt [] Rt     FEET Plantar Flexed [] Lt [] Rt      Inversion                 [] Lt [] Rt      Eversion                 [] Lt [] Rt     HEAD [x]  Functional []  Good Head Control    & []  Flexed         []  Extended []  Adequate Head Control    NECK []  Rotated  Lt  []  Lat Flexed Lt []  Rotated  Rt []  Lat Flexed Rt []  Limited Head Control     []  Cervical Hyperextension []  Absent  Head Control     SHOULDERS ELBOWS WRIST& HAND       Left     Right    Left     Right    Left     Right   U/E [x] Functional           [x] Functional   [] Fisting             [] Fisting      [] elev   [] dep      [] elev   [] dep       [] pro -[] retract     [] pro  [] retract [] subluxed             [] subluxed           Goals for Wheelchair Mobility  [x]  Independence with mobility in the home with motor related ADLs  (MRADLs)  [x]  Independence with MRADLs in the community []  Provide dependent mobility  []  Provide recline     [] Provide tilt   Goals for Seating system []  Optimize pressure distribution [x]  Provide support needed to facilitate function or safety []  Provide corrective forces to assist with maintaining or improving posture []  Accommodate client's posture:   current seated postures and positions are not flexible or will not tolerate corrective forces []  Client  to be independent with relieving pressure in the wheelchair [] Enhance physiological function such as breathing, swallowing, digestion  Simulation ideas/Equipment trials:Trialed power wheelchair with joystick on right in clinic and pt was able to propel safely. State why other equipment was unsuccessful:   MOBILITY BASE RECOMMENDATIONS and JUSTIFICATION: MOBILITY COMPONENT JUSTIFICATION  Manufacturer: PrideModel: Eva    Size: Width 18Seat Depth 18 [x] provide transport from point A to B      [x] promote Indep mobility  [x] is not a safe, functional ambulator [x] walker or cane inadequate [] non-standard width/depth necessary to accommodate anatomical measurement []    [] Manual Mobility Base [] non-functional ambulator    [] Scooter/POV  [] can safely operate  [] can safely transfer   [] has adequate trunk stability  [] cannot functionally propel manual w/c  [x] Power Mobility Base  [] non-ambulatory  [x] cannot functionally propel manual wheelchair  [x]  cannot functionally and safely operate scooter/POV [x] can safely operate and willing to  [] Stroller Base [] infant/child  [] unable to propel manual wheelchair [] allows for growth [] non-functional ambulator [] non-functional UE [] Indep mobility is not a goal at this time  [] Tilt  [] Forward [] Backward [] Powered tilt  [] Manual tilt  [] change position against gravitational force on head and shoulders  [] change position for pressure relief/cannot weight shift [] transfers  [] management of  tone [] rest periods [] control edema [] facilitate postural control  []    [] Recline  [] Power recline on power base [] Manual recline on manual base  [] accommodate femur to back angle  [] bring to full recline for ADL care  [] change position for pressure relief/cannot weight shift [] rest periods [] repositioning for transfers or clothing/diaper /catheter changes [] head positioning  [] Lighter weight required [] self- propulsion  [] lifting []    [] Heavy Duty required [] user weight greater than 250# [] extreme tone/ over active movement [] broken frame on previous chair []    []  Back  [x]  Angle Adjustable []  Custom molded Captain's seat and back [x] postural control [] control of tone/spasticity [] accommodation of range of motion [] UE functional control [] accommodation for seating system []   [] provide lateral trunk support [] accommodate deformity [x] provide posterior trunk support [] provide lumbar/sacral support [] support trunk in midline [] Pressure relief over spinal processes  [x]  Seat Cushion Captains [] impaired sensation  [] decubitus ulcers present [] history of pressure ulceration [] prevent pelvic extension [x] low maintenance  [] stabilize pelvis  [] accommodate obliquity [] accommodate multiple deformity [] neutralize lower extremity position [] increase pressure distribution []    []  Pelvic/thigh support  []  Lateral thigh guide []  Distal medial pad  []  Distal lateral pad []  pelvis in neutral [] accommodate pelvis []  position upper legs []  alignment []  accommodate ROM []  decr adduction [] accommodate tone [] removable for transfers [] decr abduction  []  Lateral trunk Supports []  Lt     []  Rt [] decrease lateral trunk leaning [] control tone [] contour for increased contact [] safety  [] accommodate asymmetry []    []  Mounting hardware  [] lateral trunk supports  [] back   [] seat [] headrest      []  thigh support [] fixed   [] swing away [] attach seat platform/cushion to w/c frame [] attach  back cushion to w/c frame [] mount postural supports [] mount headrest  [] swing medial thigh support away [] swing lateral supports away for transfers  []      Armrests  [] fixed [x] adjustable height [] removable   [] swing away  [x] flip back   [] reclining [x] full length pads [] desk    [] pads tubular  [x] provide support with elbow at 90   [] provide support for w/c tray [x] change of height/angles for variable activities [x] remove for transfers [x] allow to come closer to table top [] remove for access to tables []    Hangers/ Leg rests  [] 60 [] 70 [] 90 []   elevating [] heavy duty  [] articulating [] fixed [] lift off [] swing away     [] power [] provide LE support  [] accommodate to hamstring tightness [] elevate legs during recline   [] provide change in position for Legs [] Maintain placement of feet on footplate [] durability [] enable transfers [] decrease edema [] Accommodate lower leg length []    Foot support Footplate    [] Lt  []  Rt  []  Center mount [] flip up     [] depth/angle adjustable [] Amputee adapter    []  Lt     []  Rt [] provide foot support [] accommodate to ankle ROM [] transfers [] Provide support for residual extremity []  allow foot to go under wheelchair base []  decrease tone  []    []  Ankle strap/heel loops [] support foot on foot support [] decrease extraneous movement [] provide input to heel  [] protect foot  Tires: [] pneumatic  [] flat free inserts  [] solid  [] decrease maintenance  [] prevent frequent flats [] increase shock absorbency [] decrease pain from road shock [] decrease spasms from road shock []    []  Headrest  [] provide posterior head support [] provide posterior neck support [] provide lateral head support [] provide anterior head support [] support during tilt and recline [] improve feeding   [] improve respiration [] placement of switches [] safety  [] accommodate ROM  [] accommodate tone [] improve visual orientation  []  Anterior chest strap []  Vest []  Shoulder  retractors  [] decrease forward movement of shoulder [] accommodation of TLSO [] decrease forward movement of trunk [] decrease shoulder elevation [] added abdominal support [] alignment [] assistance with shoulder control  []    Pelvic Positioner [x] Belt [] SubASIS bar [] Dual Pull [] stabilize tone [x] decrease falling out of chair/ **will not Decr potential for sliding due to pelvic tilting [] prevent excessive rotation [] pad for protection over boney prominence [] prominence comfort [] special pull angle to control rotation []    Upper Extremity Support [] L   []  R [] Arm trough    [] hand support []  tray       [] full tray [] swivel mount [] decrease edema      [] decrease subluxation   [] control tone   [] placement for AAC/Computer/EADL [] decrease gravitational pull on shoulders [] provide midline positioning [] provide support to increase UE function [] provide hand support in natural position [] provide work surface   POWER WHEELCHAIR CONTROLS  [x] Proportional  [] Non-Proportional Type  [] Left  [x] Right [x] provides access for controlling wheelchair   [] lacks motor control to operate proportional drive control [] unable to understand proportional controls  Actuator Control Module  [] Single  [] Multiple   [] Allow the client to operate the power seat function(s) through the joystick control   [] Safety Reset Switches [] Used to change modes and stop the wheelchair when driving in latch mode    [] Upgraded Electronics   [] programming for accurate control [] progressive Disease/changing condition [] non-proportional drive control needed [] Needed in order to operate power seat functions through joystick control   [] Display box [] Allows user to see in which mode and drive the wheelchair is set  [] necessary for alternate controls    [] Digital interface electronics [] Allows w/c to operate when using alternative drive controls  [] ASL Head Array [] Allows client to operate wheelchair  through switches placed  in tri-panel headrest  [] Sip and puff with tubing kit [] needed to operate sip and puff drive controls  [] Upgraded tracking electronics [] increase safety when driving [] correct tracking when on uneven surfaces  [x] Mount for switches or joystick [] Attaches switches to w/c  [x] Swing away for access or transfers [] midline for optimal placement [] provides for consistent access  [] Attendant controlled joystick plus mount [] safety [] long distance driving [] operation of seat functions [] compliance with transportation regulations []      Rear wheel placement/Axle  adjustability [] None [] semi adjustable [] fully adjustable  [] improved UE access to wheels [] improved stability [] changing angle in space for improvement of postural stability [] 1-arm drive access [] amputee pad placement []    Wheel rims/ hand rims  [] metal  [] plastic coated [] oblique projections [] vertical projections [] Provide ability to propel manual wheelchair  []  Increase self-propulsion with hand weakness/decreased grasp  Push handles [] extended  [] angle adjustable  [] standard [] caregiver access [] caregiver assist [] allows hooking to enable increased ability to perform ADLs or maintain balance  One armed device  [] Lt   [] Rt [] enable propulsion of manual wheelchair with one arm   []      Brake/wheel lock extension []  Lt   []  Rt [] increase indep in applying wheel locks   [] Side guards [] prevent clothing getting caught in wheel or becoming soiled []  prevent skin tears/abrasions  Battery: U1 x 2 [x] to power wheelchair   Other:     The above equipment has a life- long use expectancy. Growth and changes in medical and/or functional conditions would be the exceptions. This is to certify that the therapist has no financial relationship with durable medical provider or manufacturer. The therapist will not receive remuneration of any kind for the equipment recommended in this evaluation.   Patient has mobility limitation that  significantly impairs safe, timely participation in one or more mobility related ADL's.  (bathing, toileting, feeding, dressing, grooming, moving from room to room)                                                             [x]  Yes []  No Will mobility device sufficiently improve ability to participate and/or be aided in participation of MRADL's?         [x]  Yes []  No Can limitation be compensated for with use of a cane or walker?                                                                                []  Yes [x]  No Does patient or caregiver demonstrate ability/potential ability & willingness to safely use the mobility device?   [x]  Yes []  No Does patient's home environment support use of recommended mobility device?                                                    [x]  Yes []  No Does patient have sufficient upper extremity function necessary to functionally propel a manual wheelchair?    []  Yes [x]  No Does patient have sufficient strength and trunk stability to safely operate a POV (scooter)?                                  []  Yes [x]  No Does patient need additional features/benefits provided by a power wheelchair for MRADL's in the home?       [  x] Yes []  No Does the patient demonstrate the ability to safely use a power wheelchair?                                                              [x]  Yes []  No  Therapist Name Printed: Cherly Anderson Date: 03/29/2021  Therapist's Signature:   Date:   Supplier's Name Printed: Deberah Pelton Date: 03/29/2021  Supplier's Signature:   Date:  Patient/Caregiver Signature:   Date:     This is to certify that I have read this evaluation and do agree with the content within:   Physician's Name Printed: Ronnie Doss  Physician's Signature:  Date:     This is to certify that I, the above signed therapist have the following affiliations: []  This DME provider []  Manufacturer of recommended equipment []  Patient's long term care facility [x]  None  of the above                         Plan - 03/29/21 1434     Clinical Impression Statement Pt presents for power wheelchair evaluation. He has history of multiple falls. Impaired standing balance with TUG of 29 sec indicating high fall risk. Pt also desaturates with gait short distances with RW to 88%. Pt walks with short, shuffles steps with flexed posture. He is unable to utilize manual chair or scooter safely at this time and would benefit from power wheelchair to be able to maximize independence in his home.    Personal Factors and Comorbidities Comorbidity 3+    Comorbidities DM2, stage 3a chronic kidney disease, abnormal MMSW, impaired mobility and endurance, history of MIs, aorta surgery, COPS    Examination-Activity Limitations Bathing;Locomotion Level;Transfers;Stand    Examination-Participation Restrictions Community Activity;Yard Work;Cleaning;Meal Prep    Stability/Clinical Decision Making Evolving/Moderate complexity    Clinical Decision Making Moderate    Rehab Potential Good    PT Frequency One time visit    PT Treatment/Interventions Other (comment)   w/c management   PT Next Visit Plan W/c evaluation only    Consulted and Agree with Plan of Care Patient;Family member/caregiver             Patient will benefit from skilled therapeutic intervention in order to improve the following deficits and impairments:  Abnormal gait, Decreased activity tolerance, Cardiopulmonary status limiting activity, Decreased balance, Decreased cognition, Decreased mobility, Decreased strength, Pain, Decreased endurance, Decreased knowledge of use of DME  Visit Diagnosis: Muscle weakness (generalized)     Problem List Patient Active Problem List   Diagnosis Date Noted   Diarrhea 03/26/2021   Infected sebaceous cyst 01/30/2021   Viral upper respiratory tract infection 10/25/2020   Hyperlipidemia associated with type 2 diabetes mellitus ([place]) 02/04/2020   CAP  (community acquired pneumonia) 09/07/2019   Hypomagnesemia 09/07/2019   CKD (chronic kidney disease), stage III ([place]) 09/07/2019   Volume overload 09/07/2019   Community acquired pneumonia 09/07/2019   GI bleeding 04/14/2019   Acute on chronic diastolic heart failure (HCC)    AKI (acute kidney injury) ([place])    Anemia due to blood loss    Bradycardia    Renal insufficiency    UGI bleed 04/13/2019   Chest pain 04/13/2019   History of recent transcatheter aortic valve replacement (  TAVR) 04/13/2019   Cholecystostomy care ([place]) 04/13/2019   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   COPD (chronic obstructive pulmonary disease) (HCC)    Coronary artery disease    Chronic ischemic heart disease    Paroxysmal atrial fibrillation (HCC)    Cholecystitis    OSA (obstructive sleep apnea)    Coagulopathy (HCC)    Chronic anticoagulation    Dysphagia    Elevated alkaline phosphatase measurement    Elevated bilirubin    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   Sinus bradycardia 02/18/2019   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   Normocytic anemia 12/23/2018   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[place]) 12/23/2018   OSA (obstructive sleep apnea) 12/23/2018   Severe aortic stenosis 11/26/2018   Anxiety 10/28/2018   CHF (congestive heart failure) ([place]) 10/28/2018   At risk for falls 10/28/2018   Oxygen dependent 10/28/2018   Major depressive disorder, single episode, severe without psychotic features ([place]) 07/21/2017   Thrombocytopenia ([place]) 07/21/2017   COPD (chronic obstructive pulmonary disease) ([place]) 12/27/2015   CAD (coronary artery disease) 12/27/2015   Gastroesophageal reflux disease 12/27/2015   Hypertension associated with diabetes ([place]) 12/27/2015   Hyperlipidemia 09/09/2013   Type 2 diabetes mellitus ([place]) 09/09/2013    Electa Sniff, PT, DPT, NCS 03/29/2021, 2:41 PM  [facility] [address] [place], [place], [address] Phone: [phone]   Fax:  [phone]  Name: NISSAN FRAZZINI MRN: [id number] Date of Birth: April 23, 1940

## 2021-03-30 NOTE — Telephone Encounter (Signed)
Spoke with Nicki Reaper and and he went to his appointment yesterday 03/29/21.

## 2021-04-14 DIAGNOSIS — E1159 Type 2 diabetes mellitus with other circulatory complications: Secondary | ICD-10-CM

## 2021-04-14 DIAGNOSIS — E1165 Type 2 diabetes mellitus with hyperglycemia: Secondary | ICD-10-CM

## 2021-04-14 DIAGNOSIS — F331 Major depressive disorder, recurrent, moderate: Secondary | ICD-10-CM

## 2021-04-14 DIAGNOSIS — F32A Depression, unspecified: Secondary | ICD-10-CM

## 2021-04-14 DIAGNOSIS — E782 Mixed hyperlipidemia: Secondary | ICD-10-CM

## 2021-04-14 DIAGNOSIS — I152 Hypertension secondary to endocrine disorders: Secondary | ICD-10-CM

## 2021-04-14 DIAGNOSIS — F419 Anxiety disorder, unspecified: Secondary | ICD-10-CM

## 2021-04-14 DIAGNOSIS — E1169 Type 2 diabetes mellitus with other specified complication: Secondary | ICD-10-CM

## 2021-04-14 DIAGNOSIS — E785 Hyperlipidemia, unspecified: Secondary | ICD-10-CM

## 2021-04-14 DIAGNOSIS — E1122 Type 2 diabetes mellitus with diabetic chronic kidney disease: Secondary | ICD-10-CM

## 2021-04-14 DIAGNOSIS — N1831 Chronic kidney disease, stage 3a: Secondary | ICD-10-CM

## 2021-04-14 DIAGNOSIS — I509 Heart failure, unspecified: Secondary | ICD-10-CM

## 2021-04-14 DIAGNOSIS — J449 Chronic obstructive pulmonary disease, unspecified: Secondary | ICD-10-CM

## 2021-04-17 ENCOUNTER — Telehealth: Payer: Self-pay | Admitting: Family Medicine

## 2021-04-17 NOTE — Telephone Encounter (Signed)
Patients daughter aware that FMLA paper was faxed on 12/30 and I have refaxed it for her. I also have placed a copy up front for daughter to pick up.

## 2021-04-19 ENCOUNTER — Ambulatory Visit (INDEPENDENT_AMBULATORY_CARE_PROVIDER_SITE_OTHER): Payer: Medicare Other | Admitting: Family Medicine

## 2021-04-19 ENCOUNTER — Encounter: Payer: Self-pay | Admitting: Family Medicine

## 2021-04-19 VITALS — BP 142/78 | HR 82 | Temp 97.5°F | Ht 68.0 in | Wt 199.8 lb

## 2021-04-19 DIAGNOSIS — B359 Dermatophytosis, unspecified: Secondary | ICD-10-CM | POA: Diagnosis not present

## 2021-04-19 DIAGNOSIS — E1159 Type 2 diabetes mellitus with other circulatory complications: Secondary | ICD-10-CM | POA: Diagnosis not present

## 2021-04-19 DIAGNOSIS — F0280 Dementia in other diseases classified elsewhere without behavioral disturbance: Secondary | ICD-10-CM | POA: Insufficient documentation

## 2021-04-19 DIAGNOSIS — L602 Onychogryphosis: Secondary | ICD-10-CM

## 2021-04-19 DIAGNOSIS — E039 Hypothyroidism, unspecified: Secondary | ICD-10-CM | POA: Insufficient documentation

## 2021-04-19 DIAGNOSIS — J3489 Other specified disorders of nose and nasal sinuses: Secondary | ICD-10-CM | POA: Diagnosis not present

## 2021-04-19 DIAGNOSIS — E785 Hyperlipidemia, unspecified: Secondary | ICD-10-CM

## 2021-04-19 DIAGNOSIS — B351 Tinea unguium: Secondary | ICD-10-CM | POA: Diagnosis not present

## 2021-04-19 DIAGNOSIS — F32A Depression, unspecified: Secondary | ICD-10-CM

## 2021-04-19 DIAGNOSIS — N1832 Chronic kidney disease, stage 3b: Secondary | ICD-10-CM

## 2021-04-19 DIAGNOSIS — N179 Acute kidney failure, unspecified: Secondary | ICD-10-CM

## 2021-04-19 DIAGNOSIS — G309 Alzheimer's disease, unspecified: Secondary | ICD-10-CM | POA: Diagnosis not present

## 2021-04-19 DIAGNOSIS — E1122 Type 2 diabetes mellitus with diabetic chronic kidney disease: Secondary | ICD-10-CM

## 2021-04-19 DIAGNOSIS — E1169 Type 2 diabetes mellitus with other specified complication: Secondary | ICD-10-CM | POA: Diagnosis not present

## 2021-04-19 DIAGNOSIS — I152 Hypertension secondary to endocrine disorders: Secondary | ICD-10-CM

## 2021-04-19 DIAGNOSIS — N1831 Chronic kidney disease, stage 3a: Secondary | ICD-10-CM | POA: Diagnosis not present

## 2021-04-19 DIAGNOSIS — F419 Anxiety disorder, unspecified: Secondary | ICD-10-CM

## 2021-04-19 LAB — BAYER DCA HB A1C WAIVED: HB A1C (BAYER DCA - WAIVED): 8.5 % — ABNORMAL HIGH (ref 4.8–5.6)

## 2021-04-19 MED ORDER — CLOTRIMAZOLE-BETAMETHASONE 1-0.05 % EX CREA: 1.0000 "application " | TOPICAL_CREAM | Freq: Two times a day (BID) | CUTANEOUS | 1 refills | Status: DC

## 2021-04-19 MED ORDER — AZELASTINE HCL 0.1 % NA SOLN
1.0000 | Freq: Two times a day (BID) | NASAL | 12 refills | Status: DC
Start: 2021-04-19 — End: 2022-03-19

## 2021-04-19 MED ORDER — GLIPIZIDE 5 MG PO TABS: 5.0000 mg | ORAL_TABLET | Freq: Two times a day (BID) | ORAL | 3 refills | Status: DC

## 2021-04-19 MED ORDER — MIRTAZAPINE 15 MG PO TABS: 15.0000 mg | ORAL_TABLET | Freq: Every day | ORAL | 1 refills | Status: DC

## 2021-04-19 NOTE — Progress Notes (Signed)
Subjective: CC:DM PCP: [name], DO JSE:GBTD D Sliger is a 81 y.o. male presenting to clinic today for:  1. Type 2 Diabetes with hypertension, hyperlipidemia and CKD3b:  He is accompanied today's visit by his wife.  He is compliant with his medications.  He has had some decreased appetite as of late and that worries his wife.  He often will pick at his food.  Last eye exam: needs Last foot exam: needs Last A1c:  Lab Results  Component Value Date   HGBA1C 6.7 (H) 01/15/2021   Nephropathy screen indicated?: has CKD Last flu, zoster and/or pneumovax:  Immunization History  Administered Date(s) Administered   Fluad Quad(high Dose 65+) 02/04/2020, 01/15/2021   Influenza, High Dose Seasonal PF 02/29/2016, 01/20/2017, 02/10/2018   Influenza, Seasonal, Injecte, Preservative Fre 02/03/2014   Influenza,inj,Quad PF,6+ Mos 01/01/2019   Influenza-Unspecified 02/03/2014, 02/29/2016, 01/20/2017, 02/10/2018   Moderna Sars-Covid-2 Vaccination 07/05/2019, 08/02/2019, 02/23/2020   PNEUMOCOCCAL CONJUGATE-20 01/15/2021   Pneumococcal Conjugate-13 11/25/2014, 11/25/2014   Pneumococcal Polysaccharide-23 05/30/2016, 05/30/2016, 01/01/2019   Tdap 11/25/2014, 11/25/2014    ROS: No chest pain, shortness of breath reported.  Complains of rhinorrhea.  2.  Rash Wife noticed a rash on patient's back.  It looks scaly.  She has been applying Tinactin on it for the last few days.  3.  Depression anxiety Depression and anxiety are not well controlled.  He does want to go up on the mirtazapine.   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    Anemia    Anxiety and depression    Aortic stenosis    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HCC)    CHF (congestive heart failure)  (HCC)    Cholecystitis    Chronic ischemic heart disease    Complication of anesthesia    hard to be put to sleep   COPD (chronic obstructive pulmonary disease) ([place])    Coronary artery disease    Diabetes mellitus without complication ([place])    Diabetes mellitus without complication ([place])    takes Januvia and Metformin daily   GERD (gastroesophageal reflux disease)    GERD (gastroesophageal reflux disease)    takes omeprazole daily   Headache(784.0)    sinus   History of bronchitis    last time >30yrs ago   Hyperlipidemia    takes Lipitor daily   Hypertension    Hypertension    takes Metoprolol daily   Infected sebaceous cyst 01/30/2021   Joint pain    Myocardial infarction ([place])    x 3;last one about 3-9yrs ago   Neoplasm of uncertain behavior of bladder    Obesity    OSA (obstructive sleep apnea)    Pacemaker    Pancytopenia (HCC)    Paroxysmal atrial fibrillation (HCC)    Pneumonia    last itme about 30yrs ago   S/P TAVR (transcatheter aortic valve replacement)    Severe aortic stenosis    Sinus bradycardia     Current Outpatient Medications:    albuterol (VENTOLIN HFA) 108 (90 Base) MCG/ACT inhaler, INHALE 2 PUFFS INTO THE LUNGS EVERY 6 HOURS AS NEEDED FOR WHEEZING OR SHORTNESS OF BREATH, Disp: 8.5 g, Rfl: 1   clopidogrel (PLAVIX) 75 MG tablet, TAKE ONE (1) TABLET EACH DAY,  Disp: 90 tablet, Rfl: 0   acetaZOLAMIDE (DIAMOX) 250 MG tablet, Take by mouth., Disp: , Rfl:    albuterol (PROVENTIL) (2.5 MG/3ML) 0.083% nebulizer solution, Take 2.5 mg by nebulization every 6 (six) hours as needed for wheezing or shortness of breath., Disp: , Rfl:    aspirin EC 81 MG tablet, Take 81 mg by mouth daily., Disp: , Rfl:    calcium carbonate (OSCAL) 1500 (600 Ca) MG TABS tablet, Take by mouth 2 (two) times daily with a meal., Disp: , Rfl:    dapagliflozin propanediol (FARXIGA) 10 MG TABS tablet, TAKE ONE TABLET EACH MORNING BEFORE BREAKFAST, Disp: 30 tablet, Rfl: 12    dextromethorphan-guaiFENesin (MUCINEX DM) 30-600 MG 12hr tablet, Take 1 tablet by mouth 2 (two) times daily., Disp: 30 tablet, Rfl: 0   donepezil (ARICEPT) 5 MG tablet, Take 5 mg (1 pill) daily for 4 weeks, then increase to 10 mg (2 pills) daily, Disp: 60 tablet, Rfl: 2   ferrous sulfate 325 (65 FE) MG EC tablet, Take 325 mg by mouth every Monday, Wednesday, and Friday. , Disp: , Rfl:    glipiZIDE (GLUCOTROL) 5 MG tablet, Take 1 tablet (5 mg total) by mouth daily before breakfast., Disp: 90 tablet, Rfl: 3   levothyroxine (SYNTHROID) 100 MCG tablet, TAKE ONE (1) TABLET EACH DAY, Disp: 90 tablet, Rfl: 3   loperamide (IMODIUM) 2 MG capsule, Take 2 capsules (4 mg total) by mouth as needed for diarrhea or loose stools. Followed by 2 mg after each loose stool for maximum of 16 mg/day., Disp: 30 capsule, Rfl: 1   lubiprostone (AMITIZA) 24 MCG capsule, TAKE 1 CAPSULE TWICE A DAY WITH MEALS., Disp: 60 capsule, Rfl: 2   mirtazapine (REMERON) 7.5 MG tablet, Take 1 tablet (7.5 mg total) by mouth at bedtime. STOP LEXAPRO, Disp: 90 tablet, Rfl: 3   Multiple Vitamin (MULTIVITAMIN WITH MINERALS) TABS, Take 1 tablet by mouth daily., Disp: , Rfl:    nitroGLYCERIN (NITROSTAT) 0.4 MG SL tablet, DISSOLVE 1 TAB UNDER TOUNGE FOR CHEST PAIN. MAY REPEAT EVERY 5 MINUTES FOR 3 DOSES. IF NO RELIEF CALL 911 OR GO TO ER, Disp: 25 tablet, Rfl: 2   ondansetron (ZOFRAN-ODT) 4 MG disintegrating tablet, TAKE 1 TABLET BY MOUTH EVERY 8 HOURS AS NEEDED FOR NAUSEA & VOMITING, Disp: 30 tablet, Rfl: 0   oxyCODONE (ROXICODONE) 5 MG immediate release tablet, Take 1 tablet (5 mg total) by mouth every 4 (four) hours as needed for severe pain or breakthrough pain., Disp: 5 tablet, Rfl: 0   pantoprazole (PROTONIX) 40 MG tablet, TAKE ONE (1) TABLET EACH DAY, Disp: 90 tablet, Rfl: 0   rosuvastatin (CRESTOR) 10 MG tablet, TAKE ONE (1) TABLET EACH DAY, Disp: 90 tablet, Rfl: 3   Semaglutide (RYBELSUS) 7 MG TABS, Take 7 mg by mouth daily., Disp: 30  tablet, Rfl: 12   sodium chloride (OCEAN) 0.65 % nasal spray, Place into the nose., Disp: , Rfl:    spironolactone (ALDACTONE) 25 MG tablet, Take 25 mg by mouth daily. , Disp: , Rfl:    SYMBICORT 80-4.5 MCG/ACT inhaler, Inhale 2 puffs into the lungs 2 (two) times daily as needed., Disp: , Rfl:    torsemide (DEMADEX) 20 MG tablet, TAKE TWO TABLETS BY MOUTH TWICE DAILY, Disp: 120 tablet, Rfl: 2   traZODone (DESYREL) 50 MG tablet, TAKE 1/2 TO 1 TABLET AT BEDTIME AS NEEDED FOR SLEEP, Disp: 90 tablet, Rfl: 1 Social History   Socioeconomic History   Marital status: Married  Spouse name: Not on file   Number of children: Not on file   Years of education: Not on file   Highest education level: Not on file  Occupational History   Occupation: retired  Tobacco Use   Smoking status: Former   Smokeless tobacco: Never   Tobacco comments:    quit smoking 20+yrs ago  Vaping Use   Vaping Use: Never used  Substance and Sexual Activity   Alcohol use: No    Comment: last drank about 2-3 years ago   Drug use: No   Sexual activity: Yes  Other Topics Concern   Not on file  Social History Narrative   Lives with his wife - their grandson lives with them at times       Social Determinants of Health   Financial Resource Strain: Low Risk    Difficulty of Paying Living Expenses: Not very hard  Food Insecurity: No Food Insecurity   Worried About Charity fundraiser in the Last Year: Never true   [place] in the Last Year: Never true  Transportation Needs: No Transportation Needs   Lack of Transportation (Medical): No   Lack of Transportation (Non-Medical): No  Physical Activity: Inactive   Days of Exercise per Week: 0 days   Minutes of Exercise per Session: 0 min  Stress: Stress Concern Present   Feeling of Stress : To some extent  Social Connections: Moderately Isolated   Frequency of Communication with Friends and Family: Twice a week   Frequency of Social Gatherings with Friends and  Family: Twice a week   Attends Religious Services: Never   Marine scientist or Organizations: No   Attends Music therapist: Never   Marital Status: Married  Human resources officer Violence: Not At Risk   Fear of Current or Ex-Partner: No   Emotionally Abused: No   Physically Abused: No   Sexually Abused: No   Family History  Problem Relation Age of Onset   Cancer Mother        Unknown type   Heart disease Father    Diabetes Father    Heart disease Sister    Seizures Brother    Diabetes Daughter    Diabetes Daughter    Heart attack Son    Colon cancer Mother        in her 36s.    Stomach cancer Neg Hx    Esophageal cancer Neg Hx     Objective: Office vital signs reviewed. BP (!) 142/78    Pulse 82    Temp (!) 97.5 F (36.4 C)    Ht 5\' 8"  (1.727 m)    Wt 199 lb 12.8 oz (90.6 kg)    SpO2 (!) 88%    BMI 30.38 kg/m   Physical Examination:  General: Awake, alert, well nourished, No acute distress HEENT: Sclera white.  Moist mucous membranes Cardio: regular rate and rhythm, S1S2 heard, no murmurs appreciated Pulm: clear to auscultation bilaterally, no wheezes, rhonchi or rales; normal work of breathing on room air GI: Protuberant Extremities: warm, well perfused, No edema, cyanosis or clubbing; +2 pulses bilaterally MSK: Slow gait and station.  Uses rolling walker Skin: He has a maculopapular rash noted on the back but this does have associated scaling of the skin.  Similar on left flank Neuro: See foot exam below Diabetic Foot Exam - Simple   Simple Foot Form Diabetic Foot exam was performed with the following findings: Yes 04/19/2021  5:18 PM  Visual Inspection See comments: Yes Sensation Testing Intact to touch and monofilament testing bilaterally: Yes Pulse Check Posterior Tibialis and Dorsalis pulse intact bilaterally: Yes Comments Scaling of the skin of the feet bilaterally.  He has thickened onychomycotic nails bilaterally.      Assessment/  Plan: 81 y.o. male   Type 2 diabetes mellitus with stage 3a chronic kidney disease, without long-term current use of insulin ([place]) - Plan: Bayer DCA Hb A1c Waived, glipiZIDE (GLUCOTROL) 5 MG tablet  Hyperlipidemia associated with type 2 diabetes mellitus ([place])  Hypertension associated with diabetes ([place])  Acute renal failure superimposed on stage 3b chronic kidney disease, unspecified acute renal failure type ([place]) - Plan: Basic Metabolic Panel, CBC  Acquired hypothyroidism - Plan: TSH, T4, Free  Alzheimer's dementia without behavioral disturbance (HCC)  Tinea - Plan: clotrimazole-betamethasone (LOTRISONE) cream  Rhinorrhea - Plan: azelastine (ASTELIN) 0.1 % nasal spray  Anxiety and depression - Plan: mirtazapine (REMERON) 15 MG tablet  Thickened nails - Plan: Ambulatory referral to Podiatry  Onychomycosis - Plan: Ambulatory referral to Podiatry  His sugar is not at goal.  A1c has risen quite a bit since discontinuation of the glipizide so we will advance him back to 5 mg twice daily.  Continue Rybelsus at current dose  Continue statin.  Not yet due for fasting lipid  Noted to have an AKI on most recent lab draw in the ER.  Likely secondary to GI illness but will repeat.  Check TSH, free T4.  Hypothyroidism however was not discussed in detail today  Tinea suspected of the back.  I am going to place him on clotrimazole with betamethasone to help with the itch cycle.  Rhinorrhea to be treated with Astelin.  Anxiety depression are not controlled.  Start increased dose of mirtazapine to 15 mg  For his feet I have referred him to podiatry reason per his request  No orders of the defined types were placed in this encounter.  No orders of the defined types were placed in this encounter.    [name], DO [place] [phone]

## 2021-04-20 LAB — CBC
Hematocrit: 47.4 % (ref 37.5–51.0)
Hemoglobin: 15.5 g/dL (ref 13.0–17.7)
MCH: 33.1 pg — ABNORMAL HIGH (ref 26.6–33.0)
MCHC: 32.7 g/dL (ref 31.5–35.7)
MCV: 101 fL — ABNORMAL HIGH (ref 79–97)
Platelets: 152 10*3/uL (ref 150–450)
RBC: 4.68 x10E6/uL (ref 4.14–5.80)
RDW: 14.3 % (ref 11.6–15.4)
WBC: 6.7 10*3/uL (ref 3.4–10.8)

## 2021-04-20 LAB — BASIC METABOLIC PANEL
BUN/Creatinine Ratio: 16 (ref 10–24)
BUN: 23 mg/dL (ref 8–27)
CO2: 22 mmol/L (ref 20–29)
Calcium: 9.2 mg/dL (ref 8.6–10.2)
Chloride: 100 mmol/L (ref 96–106)
Creatinine, Ser: 1.48 mg/dL — ABNORMAL HIGH (ref 0.76–1.27)
Glucose: 221 mg/dL — ABNORMAL HIGH (ref 70–99)
Potassium: 4.5 mmol/L (ref 3.5–5.2)
Sodium: 139 mmol/L (ref 134–144)
eGFR: 48 mL/min/{1.73_m2} — ABNORMAL LOW (ref >59)

## 2021-04-20 LAB — TSH: TSH: 0.564 u[IU]/mL (ref 0.450–4.500)

## 2021-04-20 LAB — T4, FREE: Free T4: 1.67 ng/dL (ref 0.82–1.77)

## 2021-04-21 ENCOUNTER — Other Ambulatory Visit: Payer: Self-pay | Admitting: Family Medicine

## 2021-04-21 DIAGNOSIS — I5033 Acute on chronic diastolic (congestive) heart failure: Secondary | ICD-10-CM

## 2021-04-21 DIAGNOSIS — K5909 Other constipation: Secondary | ICD-10-CM

## 2021-04-24 ENCOUNTER — Telehealth: Payer: Self-pay | Admitting: Family Medicine

## 2021-04-24 MED ORDER — LANCET DEVICE MISC
3 refills | Status: DC
Start: 2021-04-24 — End: 2021-05-08

## 2021-04-24 MED ORDER — BLOOD GLUCOSE MONITOR SYSTEM W/DEVICE KIT: 1.0000 | PACK | Freq: Two times a day (BID) | 0 refills | Status: DC

## 2021-04-24 MED ORDER — COOL BLOOD GLUCOSE TEST STRIPS VI STRP: ORAL_STRIP | 3 refills | Status: DC

## 2021-04-24 NOTE — Telephone Encounter (Signed)
Calling about labs and to discuss with the nurse that pt needs a new meter to check sugar levels along with the strips. Please call back and advise.

## 2021-04-24 NOTE — Telephone Encounter (Signed)
I spoke to pt's wife and she is aware of results and glucometer with supplies sent to pharmacy per pt request.

## 2021-04-30 ENCOUNTER — Encounter: Payer: Self-pay | Admitting: Podiatry

## 2021-04-30 ENCOUNTER — Other Ambulatory Visit: Payer: Self-pay

## 2021-04-30 ENCOUNTER — Ambulatory Visit (INDEPENDENT_AMBULATORY_CARE_PROVIDER_SITE_OTHER): Payer: Medicare Other | Admitting: Podiatry

## 2021-04-30 DIAGNOSIS — B351 Tinea unguium: Secondary | ICD-10-CM | POA: Diagnosis not present

## 2021-04-30 DIAGNOSIS — E1142 Type 2 diabetes mellitus with diabetic polyneuropathy: Secondary | ICD-10-CM

## 2021-04-30 DIAGNOSIS — M79675 Pain in left toe(s): Secondary | ICD-10-CM

## 2021-04-30 DIAGNOSIS — M79674 Pain in right toe(s): Secondary | ICD-10-CM | POA: Diagnosis not present

## 2021-04-30 NOTE — Progress Notes (Signed)
°  Subjective:  Patient ID: Matthew Campos, male    DOB: [birth year],   MRN: [id number]  Chief Complaint  Patient presents with   debride    Pt is here for [facility].     81 y.o. male presents for diabetic foot care. Relates painful thickened and elongated toenails he is unable to trim himself. Denies burning and tinlging.  He is diabetic and last A1c was 8.5  . Denies any other pedal complaints. Denies n/v/f/c.   PCP: Ronnie Doss DO   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    Anemia    Anxiety and depression    Aortic stenosis    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HCC)    CHF (congestive heart failure) (HCC)    Cholecystitis    Chronic ischemic heart disease    Complication of anesthesia    hard to be put to sleep   COPD (chronic obstructive pulmonary disease) ([place])    Coronary artery disease    Diabetes mellitus without complication ([place])    Diabetes mellitus without complication ([place])    takes Januvia and Metformin daily   GERD (gastroesophageal reflux disease)    GERD (gastroesophageal reflux disease)    takes omeprazole daily   Headache(784.0)    sinus   History of bronchitis    last time >77yrs ago   Hyperlipidemia    takes Lipitor daily   Hypertension    Hypertension    takes Metoprolol daily   Infected sebaceous cyst 01/30/2021   Joint pain    Myocardial infarction ([place])    x 3;last one about 3-63yrs ago   Neoplasm of uncertain behavior of bladder    Obesity    OSA (obstructive sleep apnea)    Pacemaker    Pancytopenia (HCC)    Paroxysmal atrial fibrillation (HCC)    Pneumonia    last itme about 34yrs ago   S/P TAVR (transcatheter aortic valve replacement)    Severe aortic stenosis    Sinus bradycardia     Objective:  Physical Exam: Vascular: DP/PT pulses 2/4 bilateral. CFT <3 seconds. Normal hair growth on digits. No edema.  Skin. No lacerations or  abrasions bilateral feet. Nails 1-5 are thickened discolored and elongated with subungual debris.   Musculoskeletal: MMT 5/5 bilateral lower extremities in DF, PF, Inversion and Eversion. Deceased ROM in DF of ankle joint.  Neurological: Sensation intact to light touch.   Assessment:   1. Type 2 diabetes mellitus with diabetic polyneuropathy, unspecified whether long term insulin use (HCC)   2. Pain due to onychomycosis of toenails of both feet      Plan:  Patient was evaluated and treated and all questions answered. -Discussed and educated patient on diabetic foot care, especially with  regards to the vascular, neurological and musculoskeletal systems.  -Stressed the importance of good glycemic control and the detriment of not  controlling glucose levels in relation to the foot. -Discussed supportive shoes at all times and checking feet regularly.  -Mechanically debrided all nails 1-5 bilateral using sterile nail nipper and filed with dremel without incident  -Answered all patient questions -Patient to return  in 3 months for at risk foot care -Patient advised to call the office if any problems or questions arise in the meantime.   Lorenda Peck, DPM

## 2021-05-03 ENCOUNTER — Telehealth: Payer: Self-pay | Admitting: Family Medicine

## 2021-05-03 DIAGNOSIS — J449 Chronic obstructive pulmonary disease, unspecified: Secondary | ICD-10-CM | POA: Diagnosis not present

## 2021-05-03 NOTE — Telephone Encounter (Signed)
Wife aware that meter was ordered on 01/0/23 and that pharmacy received it. Advised her to call [facility]

## 2021-05-05 IMAGING — DX DG CHEST 1V PORT
1 series · 1 of 1 positions shown · non-contrast
Comparison: Most recent radiograph 04/13/2019. Most recent CT
01/04/2019

CLINICAL DATA: Shortness of breath. Respiratory distress.

EXAM:
PORTABLE CHEST 1 VIEW

[chest ap]
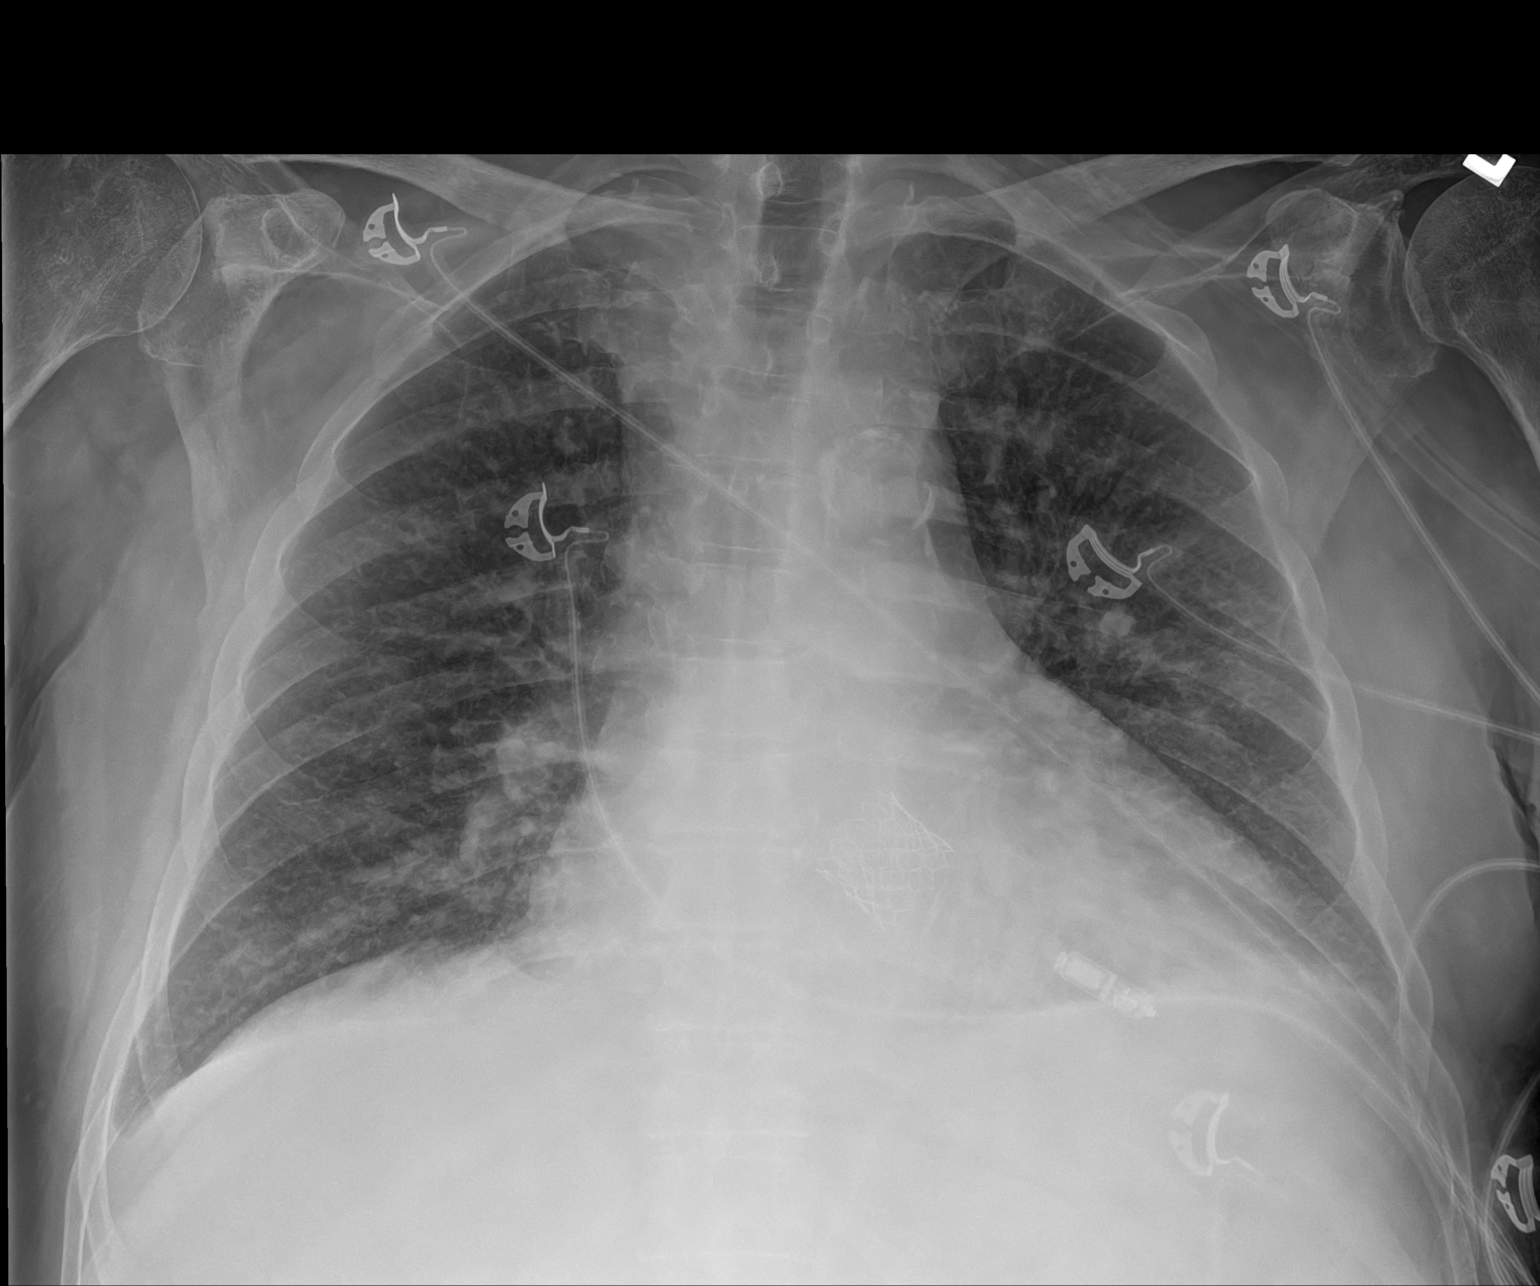

[1 of 1 positions shown; findings below may reference images not displayed]

FINDINGS: Stable cardiomegaly with prior TAVR. Loop recorder in the left chest
wall. Aortic atherosclerosis. Vascular congestion. Suggestion of
pulmonary edema with mild Kerley B-lines. Possible small pleural
effusions. Ill-defined retrocardiac opacity with additional patchy
opacities in the mid lung zones. No pneumothorax.
IMPRESSION: 1. Stable cardiomegaly. Vascular congestion with possible small
pleural effusions. Suggestion of mild pulmonary edema.
2. Ill-defined retrocardiac opacity with additional patchy opacities
in the mid lung zones. This may represent pulmonary edema or
superimposed pneumonia.

## 2021-05-08 ENCOUNTER — Telehealth: Payer: Self-pay | Admitting: Family Medicine

## 2021-05-08 MED ORDER — BLOOD GLUCOSE MONITOR SYSTEM W/DEVICE KIT: 1.0000 | PACK | Freq: Two times a day (BID) | 0 refills | Status: DC

## 2021-05-08 MED ORDER — LANCET DEVICE MISC: 3 refills | Status: DC

## 2021-05-08 MED ORDER — COOL BLOOD GLUCOSE TEST STRIPS VI STRP: ORAL_STRIP | 3 refills | Status: DC

## 2021-05-08 NOTE — Telephone Encounter (Signed)
Pt wife aware

## 2021-05-11 ENCOUNTER — Telehealth: Payer: Medicare Other

## 2021-05-16 ENCOUNTER — Other Ambulatory Visit: Payer: Self-pay | Admitting: Nurse Practitioner

## 2021-05-16 DIAGNOSIS — R197 Diarrhea, unspecified: Secondary | ICD-10-CM

## 2021-05-19 ENCOUNTER — Other Ambulatory Visit: Payer: Self-pay | Admitting: Family Medicine

## 2021-05-19 DIAGNOSIS — D500 Iron deficiency anemia secondary to blood loss (chronic): Secondary | ICD-10-CM

## 2021-05-19 DIAGNOSIS — B359 Dermatophytosis, unspecified: Secondary | ICD-10-CM

## 2021-05-19 DIAGNOSIS — J449 Chronic obstructive pulmonary disease, unspecified: Secondary | ICD-10-CM

## 2021-05-21 ENCOUNTER — Encounter: Payer: Self-pay | Admitting: Family Medicine

## 2021-05-21 ENCOUNTER — Telehealth: Payer: Self-pay | Admitting: *Deleted

## 2021-05-21 ENCOUNTER — Ambulatory Visit (INDEPENDENT_AMBULATORY_CARE_PROVIDER_SITE_OTHER): Payer: Medicare Other | Admitting: Family Medicine

## 2021-05-21 VITALS — BP 122/70 | HR 84 | Temp 98.1°F | Ht 68.0 in | Wt 208.2 lb

## 2021-05-21 DIAGNOSIS — M545 Low back pain, unspecified: Secondary | ICD-10-CM | POA: Diagnosis not present

## 2021-05-21 DIAGNOSIS — I509 Heart failure, unspecified: Secondary | ICD-10-CM | POA: Diagnosis not present

## 2021-05-21 DIAGNOSIS — G8929 Other chronic pain: Secondary | ICD-10-CM | POA: Diagnosis not present

## 2021-05-21 DIAGNOSIS — J029 Acute pharyngitis, unspecified: Secondary | ICD-10-CM | POA: Diagnosis not present

## 2021-05-21 MED ORDER — LIDOCAINE 5 % EX PTCH: 1.0000 | MEDICATED_PATCH | CUTANEOUS | 0 refills | Status: DC

## 2021-05-21 MED ORDER — METHOCARBAMOL 500 MG PO TABS: 250.0000 mg | ORAL_TABLET | Freq: Two times a day (BID) | ORAL | 0 refills | Status: DC | PRN

## 2021-05-21 NOTE — Patient Instructions (Signed)
You have fluid in your lungs and you are up almost 9lbs.  This lets me know you are retaining fluid.  Take the Torsemide AS DIRECTED! Schedule appt with Cardiologist Referral to [place] with physical therapy and to [facility] for your back are in Robaxin sent to help with pain flareups.  Use ONLY as directed.  Caution SLEEPINESS with medication. It can cause falls Use your oxygen. I want you to come back Thursday for a recheck.

## 2021-05-21 NOTE — Telephone Encounter (Signed)
Approvedtoday Request Reference Number: [id number]. LIDOCAINE PAD 5% is approved through 04/14/2022. Your patient may now fill this prescription and it will be covered.

## 2021-05-21 NOTE — Telephone Encounter (Signed)
Key: [id number] Drug Lidocaine 5% patches Sent to plan

## 2021-05-21 NOTE — Telephone Encounter (Signed)
The drug store aware

## 2021-05-21 NOTE — Progress Notes (Signed)
Subjective: CC: Back pain PCP: [name], DO MBT:[facility] Matthew Campos is a 81 y.o. male presenting to clinic today for:  1.  Back pain Patient with chronic low back pain.  Often the back pain will gradually increase to the upper back throughout the day.  By the end of the day he finds it difficult to ambulate.  At baseline he uses a cane for ambulation.  Does not wish to pursue any surgical interventions but does want some help with back pain as Tylenol is not helping at this point.  Does not report any sensory changes in the legs  2.  CHF Patient admits that he only takes 1 tablet of his torsemide daily.  Has not yet seen his cardiologist.  His wife is to reschedule this appointment.  He has 2 L of oxygen at home but is not wearing this today.  Denies any chest pain.  Does get dyspneic on exertion.  No lower extremity edema.   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    Anemia    Anxiety and depression    Aortic stenosis    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HCC)    CHF (congestive heart failure) (HCC)    Cholecystitis    Chronic ischemic heart disease    Complication of anesthesia    hard to be put to sleep   COPD (chronic obstructive pulmonary disease) (HCC)    Coronary artery disease    Diabetes mellitus without complication (HCC)    Diabetes mellitus without complication ([place])    takes Januvia and Metformin daily   GERD (gastroesophageal reflux disease)    GERD (gastroesophageal reflux disease)    takes omeprazole daily   Headache(784.0)    sinus   History of bronchitis    last time >51yrs ago   Hyperlipidemia    takes Lipitor daily   Hypertension    Hypertension    takes Metoprolol daily   Infected sebaceous cyst 01/30/2021   Joint pain     Myocardial infarction ([place])    x 3;last one about 3-58yrs ago   Neoplasm of uncertain behavior of bladder    Obesity    OSA (obstructive sleep apnea)    Pacemaker    Pancytopenia (HCC)    Paroxysmal atrial fibrillation (HCC)    Pneumonia    last itme about 63yrs ago   S/P TAVR (transcatheter aortic valve replacement)    Severe aortic stenosis    Sinus bradycardia     Current Outpatient Medications:    acetaZOLAMIDE (DIAMOX) 250 MG tablet, Take by mouth., Disp: , Rfl:    albuterol (PROVENTIL) (2.5 MG/3ML) 0.083% nebulizer solution, Take 2.5 mg by nebulization every 6 (six) hours as needed for wheezing or shortness of breath., Disp: , Rfl:    albuterol (VENTOLIN HFA) 108 (90 Base) MCG/ACT inhaler, INHALE 2 PUFFS INTO THE LUNGS EVERY 6 HOURS AS NEEDED FOR WHEEZING OR SHORTNESS OF BREATH, Disp: 8.5 g, Rfl: 1   aspirin EC 81 MG tablet, Take 81 mg by mouth daily., Disp: , Rfl:    azelastine (ASTELIN) 0.1 % nasal spray, Place 1 spray into both nostrils 2 (two) times daily. For runny nose, Disp: 30 mL, Rfl: 12   Blood Glucose Monitoring Suppl (  BLOOD GLUCOSE MONITOR SYSTEM) w/Device KIT, 1 Device by Does not apply route 2 (two) times daily. Dx E11.9, Disp: 1 kit, Rfl: 0   calcium carbonate (OSCAL) 1500 (600 Ca) MG TABS tablet, Take by mouth 2 (two) times daily with a meal., Disp: , Rfl:    clopidogrel (PLAVIX) 75 MG tablet, TAKE ONE (1) TABLET EACH DAY, Disp: 90 tablet, Rfl: 0   clotrimazole-betamethasone (LOTRISONE) cream, Apply 1 application topically 2 (two) times daily. To torso For up to 14 days, Disp: 45 g, Rfl: 1   dapagliflozin propanediol (FARXIGA) 10 MG TABS tablet, TAKE ONE TABLET EACH MORNING BEFORE BREAKFAST, Disp: 30 tablet, Rfl: 12   dextromethorphan-guaiFENesin (MUCINEX DM) 30-600 MG 12hr tablet, Take 1 tablet by mouth 2 (two) times daily., Disp: 30 tablet, Rfl: 0   donepezil (ARICEPT) 5 MG tablet, Take 5 mg (1 pill) daily for 4 weeks, then increase to 10 mg (2 pills) daily, Disp: 60  tablet, Rfl: 2   ferrous sulfate 325 (65 FE) MG EC tablet, Take 325 mg by mouth every Monday, Wednesday, and Friday. , Disp: , Rfl:    glipiZIDE (GLUCOTROL) 5 MG tablet, Take 1 tablet (5 mg total) by mouth 2 (two) times daily before a meal., Disp: 180 tablet, Rfl: 3   glucose blood (COOL BLOOD GLUCOSE TEST STRIPS) test strip, Use to check BS BID dx E11.9, Disp: 100 each, Rfl: 3   Lancet Device MISC, Use to check BS BID Dx E11.9, Disp: 100 each, Rfl: 3   levothyroxine (SYNTHROID) 100 MCG tablet, TAKE ONE (1) TABLET EACH DAY, Disp: 90 tablet, Rfl: 3   loperamide (IMODIUM) 2 MG capsule, TAKE 2 CAPSULES BY MOUTH AS NEEDED FOR DIARRHEA/LOOSE STOOL. FOLLOWED BY 1 CAPSULE AFTER EACH LOOSE STOOL (MAX OF 8 CAPSULES DAILY), Disp: 30 capsule, Rfl: 1   lubiprostone (AMITIZA) 24 MCG capsule, TAKE 1 CAPSULE TWICE A DAY WITH MEALS., Disp: 60 capsule, Rfl: 2   mirtazapine (REMERON) 15 MG tablet, Take 1 tablet (15 mg total) by mouth at bedtime., Disp: 90 tablet, Rfl: 1   Multiple Vitamin (MULTIVITAMIN WITH MINERALS) TABS, Take 1 tablet by mouth daily., Disp: , Rfl:    nitroGLYCERIN (NITROSTAT) 0.4 MG SL tablet, DISSOLVE 1 TAB UNDER TOUNGE FOR CHEST PAIN. MAY REPEAT EVERY 5 MINUTES FOR 3 DOSES. IF NO RELIEF CALL 911 OR GO TO ER, Disp: 25 tablet, Rfl: 2   ondansetron (ZOFRAN-ODT) 4 MG disintegrating tablet, TAKE 1 TABLET BY MOUTH EVERY 8 HOURS AS NEEDED FOR NAUSEA & VOMITING, Disp: 30 tablet, Rfl: 0   pantoprazole (PROTONIX) 40 MG tablet, TAKE ONE (1) TABLET EACH DAY, Disp: 90 tablet, Rfl: 0   rosuvastatin (CRESTOR) 10 MG tablet, TAKE ONE (1) TABLET EACH DAY, Disp: 90 tablet, Rfl: 3   Semaglutide (RYBELSUS) 7 MG TABS, Take 7 mg by mouth daily., Disp: 30 tablet, Rfl: 12   sodium chloride (OCEAN) 0.65 % nasal spray, Place into the nose., Disp: , Rfl:    SYMBICORT 80-4.5 MCG/ACT inhaler, Inhale 2 puffs into the lungs 2 (two) times daily as needed., Disp: , Rfl:    torsemide (DEMADEX) 20 MG tablet, TAKE TWO TABLETS BY  MOUTH TWICE DAILY, Disp: 120 tablet, Rfl: 2   traZODone (DESYREL) 50 MG tablet, TAKE 1/2 TO 1 TABLET AT BEDTIME AS NEEDED FOR SLEEP, Disp: 90 tablet, Rfl: 1   spironolactone (ALDACTONE) 25 MG tablet, Take 25 mg by mouth daily. , Disp: , Rfl:  Social History   Socioeconomic History   Marital status:  Married    Spouse name: Not on file   Number of children: Not on file   Years of education: Not on file   Highest education level: Not on file  Occupational History   Occupation: retired  Tobacco Use   Smoking status: Former   Smokeless tobacco: Never   Tobacco comments:    quit smoking 20+yrs ago  Vaping Use   Vaping Use: Never used  Substance and Sexual Activity   Alcohol use: No    Comment: last drank about 2-3 years ago   Drug use: No   Sexual activity: Yes  Other Topics Concern   Not on file  Social History Narrative   Lives with his wife - their grandson lives with them at times       Social Determinants of Health   Financial Resource Strain: Low Risk    Difficulty of Paying Living Expenses: Not very hard  Food Insecurity: No Food Insecurity   Worried About Charity fundraiser in the Last Year: Never true   [place] in the Last Year: Never true  Transportation Needs: No Transportation Needs   Lack of Transportation (Medical): No   Lack of Transportation (Non-Medical): No  Physical Activity: Inactive   Days of Exercise per Week: 0 days   Minutes of Exercise per Session: 0 min  Stress: Stress Concern Present   Feeling of Stress : To some extent  Social Connections: Moderately Isolated   Frequency of Communication with Friends and Family: Twice a week   Frequency of Social Gatherings with Friends and Family: Twice a week   Attends Religious Services: Never   Marine scientist or Organizations: No   Attends Music therapist: Never   Marital Status: Married  Human resources officer Violence: Not At Risk   Fear of Current or Ex-Partner: No    Emotionally Abused: No   Physically Abused: No   Sexually Abused: No   Family History  Problem Relation Age of Onset   Cancer Mother        Unknown type   Heart disease Father    Diabetes Father    Heart disease Sister    Seizures Brother    Diabetes Daughter    Diabetes Daughter    Heart attack Son    Colon cancer Mother        in her 59s.    Stomach cancer Neg Hx    Esophageal cancer Neg Hx     Objective: Office vital signs reviewed. BP 122/70    Pulse 84    Temp 98.1 F (36.7 C)    Ht $R'5\' 8"'xO$  (1.727 m)    Wt 208 lb 3.2 oz (94.4 kg)    SpO2 (!) 76%    BMI 31.66 kg/m   Physical Examination:  General: Awake, alert, nontoxic, No acute distress Cardio: regular rate and rhythm, S1S2 heard, no murmurs appreciated Pulm: Fine rales appreciated at bibasilar lung fields.  Normal work of breathing on room air but oxygen saturation is 76% GI: Abdomen is protuberant Extremities: No ankle edema MSK: Ambulates with use of cane.  Gait is antalgic and slow  Assessment/ Plan: 81 y.o. male   Acute on chronic congestive heart failure, unspecified heart failure type ([place]) - Plan: Basic Metabolic Panel, Brain natriuretic peptide  Chronic bilateral low back pain, unspecified whether sciatica present - Plan: Ambulatory referral to Physical Therapy, Ambulatory referral to Orthopedic Surgery, methocarbamol (ROBAXIN) 500 MG tablet, lidocaine (LIDODERM) 5 %  Sore throat - Plan: Novel Coronavirus, NAA ([facility])  Very concerned about acute on chronic CHF.  His lung exam was notable for Rales, he has a 9 pound weight gain since his last visit 1 month ago and his oxygen level was 76% on room air.  BNP and BMP ordered.  I have advised him to resume his home 2 L as soon as he gets home and make sure that he is taking his torsemide, which he has been noncompliant with, twice daily as directed.  He has been scheduled for a 72-hour follow-up.  I have reviewed red flag signs and symptoms with both his wife and  the patient.  He will follow-up as directed, or seek emergent care in the ER if any concerns arise  For his chronic back pain, I have referred him to physical therapy as well as orthopedics.  X-ray was not available today.  I have given him a very low dose and small quantity of Robaxin for as needed use.  He is not a candidate for NSAIDs given CHF, impaired renal function and will be a poor candidate for prednisone given diabetes.  We discussed the high risk use of this medication in his age group, caution fall and sedation.  Use only if needed  Patient requested screening for COVID-19.  This has been completed  No orders of the defined types were placed in this encounter.  No orders of the defined types were placed in this encounter.    [name], DO [place] [phone]

## 2021-05-22 LAB — SARS-COV-2, NAA 2 DAY TAT

## 2021-05-22 LAB — NOVEL CORONAVIRUS, NAA: SARS-CoV-2, NAA: NOT DETECTED

## 2021-05-23 LAB — BRAIN NATRIURETIC PEPTIDE: BNP: 773.7 pg/mL — ABNORMAL HIGH (ref 0.0–100.0)

## 2021-05-23 LAB — BASIC METABOLIC PANEL
BUN/Creatinine Ratio: 16 (ref 10–24)
BUN: 26 mg/dL (ref 8–27)
CO2: 23 mmol/L (ref 20–29)
Calcium: 9.3 mg/dL (ref 8.6–10.2)
Chloride: 98 mmol/L (ref 96–106)
Creatinine, Ser: 1.66 mg/dL — ABNORMAL HIGH (ref 0.76–1.27)
Glucose: 214 mg/dL — ABNORMAL HIGH (ref 70–99)
Potassium: 4.4 mmol/L (ref 3.5–5.2)
Sodium: 138 mmol/L (ref 134–144)
eGFR: 41 mL/min/{1.73_m2} — ABNORMAL LOW (ref >59)

## 2021-05-24 ENCOUNTER — Ambulatory Visit (INDEPENDENT_AMBULATORY_CARE_PROVIDER_SITE_OTHER): Payer: Medicare Other | Admitting: Family Medicine

## 2021-05-24 ENCOUNTER — Ambulatory Visit (INDEPENDENT_AMBULATORY_CARE_PROVIDER_SITE_OTHER): Payer: Medicare Other

## 2021-05-24 ENCOUNTER — Encounter: Payer: Self-pay | Admitting: Family Medicine

## 2021-05-24 VITALS — BP 124/74 | HR 72 | Temp 98.1°F | Ht 68.0 in | Wt 199.4 lb

## 2021-05-24 DIAGNOSIS — I5043 Acute on chronic combined systolic (congestive) and diastolic (congestive) heart failure: Secondary | ICD-10-CM | POA: Diagnosis not present

## 2021-05-24 DIAGNOSIS — I517 Cardiomegaly: Secondary | ICD-10-CM | POA: Diagnosis not present

## 2021-05-24 DIAGNOSIS — R0602 Shortness of breath: Secondary | ICD-10-CM

## 2021-05-24 NOTE — Progress Notes (Signed)
Subjective:  Patient ID: Matthew Campos, male    DOB: 05-20-1940, 81 y.o.   MRN: [id number]  Patient Care Team: [name], DO as PCP - General (Family Medicine) Curly Rim, MD as Referring Physician (Internal Medicine) [name], DO (Family Medicine) Shea Evans [name], LCSW as Social Worker (Licensed Clinical Social Worker) Blanca Friend, Royce Macadamia, [facility] (Pharmacist)   Chief Complaint:  Congestive Heart Failure   HPI: Matthew Campos is a 81 y.o. male presenting on 05/24/2021 for Congestive Heart Failure   Pt presents today for heart failure follow up. He was seen in office 05/21/2021. Pt was noted to have a significant weight gain since last visit and was hypoxic. Pt was not wearing prescribed supplemental oxygen and sats were in the high 70's. He also admitted to not taking his diuretic as prescribed. BNP was greater than 700, renal function acceptable. He states since last visit he has been taking his torsemide as prescribed. He is still not wearing his oxygen when out of his home, does wear at home. He states shortness of breath has improved slightly. He is scheduled to see cardiology next week.   Congestive Heart Failure Presents for follow-up visit. Associated symptoms include edema, fatigue, orthopnea, shortness of breath and unexpected weight change. Pertinent negatives include no abdominal pain, chest pain, chest pressure, claudication, muscle weakness, near-syncope, nocturia, palpitations or paroxysmal nocturnal dyspnea. The symptoms have been improving.     Relevant past medical, surgical, family, and social history reviewed and updated as indicated.  Allergies and medications reviewed and updated. Data reviewed: Chart in Epic.   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    Anemia    Anxiety and depression    Aortic stenosis    Carotid artery  disease (HCC)    CHF (congestive heart failure) (HCC)    Cholecystitis    Chronic ischemic heart disease    Complication of anesthesia    hard to be put to sleep   COPD (chronic obstructive pulmonary disease) ([place])    Coronary artery disease    Diabetes mellitus without complication ([place])    Diabetes mellitus without complication ([place])    takes Januvia and Metformin daily   GERD (gastroesophageal reflux disease)    GERD (gastroesophageal reflux disease)    takes omeprazole daily   Headache(784.0)    sinus   History of bronchitis    last time >83yr ago   Hyperlipidemia    takes Lipitor daily   Hypertension    Hypertension    takes Metoprolol daily   Infected sebaceous cyst 01/30/2021   Joint pain    Myocardial infarction ([place]    x 3;last one about 3-440yrago   Neoplasm of uncertain behavior of bladder    Obesity    OSA (obstructive sleep apnea)    Pacemaker    Pancytopenia (HCC)    Paroxysmal atrial fibrillation (HCC)    Pneumonia    last itme about [id number]   S/P TAVR (transcatheter aortic valve replacement)    Severe aortic stenosis    Sinus bradycardia     Past Surgical History:  Procedure Laterality Date   abdominal aneurysm stenting     ABDOMINAL AORTIC ANEURYSM REPAIR     per patient stents placed about 4-5 years ago   AORTIC VALVE REPLACEMENT     stated it was done in November  cholecystostomy tube     CORONARY ANGIOPLASTY  2010   CORONARY ANGIOPLASTY WITH STENT PLACEMENT     about 30 years ago per patient   cyst removed from left wrist     ESOPHAGOGASTRODUODENOSCOPY (EGD) WITH PROPOFOL N/A 04/15/2019   Procedure: ESOPHAGOGASTRODUODENOSCOPY (EGD) WITH PROPOFOL;  Surgeon: Danie Binder, MD;  [facility];  Service: Endoscopy;  Laterality: N/A;  possible dilation   HERNIA REPAIR     left shoulder surgery     POLYPECTOMY  04/15/2019   Procedure: POLYPECTOMY;  Surgeon: Danie Binder, MD;  [facility]: [facility];  Service: Endoscopy;;   duodenal    SHOULDER ARTHROSCOPY WITH ROTATOR CUFF REPAIR AND SUBACROMIAL DECOMPRESSION  04/17/2012   Procedure: SHOULDER ARTHROSCOPY WITH ROTATOR CUFF REPAIR AND SUBACROMIAL DECOMPRESSION;  Surgeon: Yvette Rack., MD;  Location: [facility];  Service: Orthopedics;  Laterality: Right;  RIGHT SHOULDER ROTATOR CUFF REPAIR INCLUDING ACROMIOPLASTY CHRONIC, ARTHROSCOPY SHOULDER DEBRIDEMENT EXTENSIVE   TONSILLECTOMY     VALVE REPLACEMENT      Social History   Socioeconomic History   Marital status: Married    Spouse name: Not on file   Number of children: Not on file   Years of education: Not on file   Highest education level: Not on file  Occupational History   Occupation: retired  Tobacco Use   Smoking status: Former   Smokeless tobacco: Never   Tobacco comments:    quit smoking 20+yrs ago  Scientific laboratory technician Use: Never used  Substance and Sexual Activity   Alcohol use: No    Comment: last drank about 2-3 years ago   Drug use: No   Sexual activity: Yes  Other Topics Concern   Not on file  Social History Narrative   Lives with his wife - their grandson lives with them at times       Social Determinants of Health   Financial Resource Strain: Low Risk    Difficulty of Paying Living Expenses: Not very hard  Food Insecurity: No Food Insecurity   Worried About Charity fundraiser in the Last Year: Never true   [place] in the Last Year: Never true  Transportation Needs: No Transportation Needs   Lack of Transportation (Medical): No   Lack of Transportation (Non-Medical): No  Physical Activity: Inactive   Days of Exercise per Week: 0 days   Minutes of Exercise per Session: 0 min  Stress: Stress Concern Present   Feeling of Stress : To some extent  Social Connections: Moderately Isolated   Frequency of Communication with Friends and Family: Twice a week   Frequency of Social Gatherings with Friends and Family: Twice a week   Attends Religious Services: Never   Building surveyor or Organizations: No   Attends Music therapist: Never   Marital Status: Married  Human resources officer Violence: Not At Risk   Fear of Current or Ex-Partner: No   Emotionally Abused: No   Physically Abused: No   Sexually Abused: No    Outpatient Encounter Medications as of 05/24/2021  Medication Sig   albuterol (PROVENTIL) (2.5 MG/3ML) 0.083% nebulizer solution Take 2.5 mg by nebulization every 6 (six) hours as needed for wheezing or shortness of breath.   albuterol (VENTOLIN HFA) 108 (90 Base) MCG/ACT inhaler INHALE 2 PUFFS INTO THE LUNGS EVERY 6 HOURS AS NEEDED FOR WHEEZING OR SHORTNESS OF BREATH   aspirin EC 81 MG tablet Take 81 mg  by mouth daily.   azelastine (ASTELIN) 0.1 % nasal spray Place 1 spray into both nostrils 2 (two) times daily. For runny nose   Blood Glucose Monitoring Suppl (BLOOD GLUCOSE MONITOR SYSTEM) w/Device KIT 1 Device by Does not apply route 2 (two) times daily. Dx E11.9   calcium carbonate (OSCAL) 1500 (600 Ca) MG TABS tablet Take by mouth 2 (two) times daily with a meal.   clopidogrel (PLAVIX) 75 MG tablet TAKE ONE (1) TABLET EACH DAY   clotrimazole-betamethasone (LOTRISONE) cream Apply 1 application topically 2 (two) times daily. To torso For up to 14 days   dapagliflozin propanediol (FARXIGA) 10 MG TABS tablet TAKE ONE TABLET EACH MORNING BEFORE BREAKFAST   dextromethorphan-guaiFENesin (MUCINEX DM) 30-600 MG 12hr tablet Take 1 tablet by mouth 2 (two) times daily.   donepezil (ARICEPT) 5 MG tablet Take 5 mg (1 pill) daily for 4 weeks, then increase to 10 mg (2 pills) daily   ferrous sulfate 325 (65 FE) MG EC tablet Take 325 mg by mouth every Monday, Wednesday, and Friday.    glipiZIDE (GLUCOTROL) 5 MG tablet Take 1 tablet (5 mg total) by mouth 2 (two) times daily before a meal.   glucose blood (COOL BLOOD GLUCOSE TEST STRIPS) test strip Use to check BS BID dx E11.9   Lancet Device MISC Use to check BS BID Dx E11.9   levothyroxine  (SYNTHROID) 100 MCG tablet TAKE ONE (1) TABLET EACH DAY   lidocaine (LIDODERM) 5 % Place 1 patch onto the skin daily. To the area of back pain. Remove & Discard patch within 12 hours or as directed by MD   loperamide (IMODIUM) 2 MG capsule TAKE 2 CAPSULES BY MOUTH AS NEEDED FOR DIARRHEA/LOOSE STOOL. FOLLOWED BY 1 CAPSULE AFTER EACH LOOSE STOOL (MAX OF 8 CAPSULES DAILY)   lubiprostone (AMITIZA) 24 MCG capsule TAKE 1 CAPSULE TWICE A DAY WITH MEALS.   methocarbamol (ROBAXIN) 500 MG tablet Take 0.5-1 tablets (250-500 mg total) by mouth 2 (two) times daily as needed for muscle spasms (back pain).   mirtazapine (REMERON) 15 MG tablet Take 1 tablet (15 mg total) by mouth at bedtime.   Multiple Vitamin (MULTIVITAMIN WITH MINERALS) TABS Take 1 tablet by mouth daily.   nitroGLYCERIN (NITROSTAT) 0.4 MG SL tablet DISSOLVE 1 TAB UNDER TOUNGE FOR CHEST PAIN. MAY REPEAT EVERY 5 MINUTES FOR 3 DOSES. IF NO RELIEF CALL 911 OR GO TO ER   ondansetron (ZOFRAN-ODT) 4 MG disintegrating tablet TAKE 1 TABLET BY MOUTH EVERY 8 HOURS AS NEEDED FOR NAUSEA & VOMITING   pantoprazole (PROTONIX) 40 MG tablet TAKE ONE (1) TABLET EACH DAY   rosuvastatin (CRESTOR) 10 MG tablet TAKE ONE (1) TABLET EACH DAY   Semaglutide (RYBELSUS) 7 MG TABS Take 7 mg by mouth daily.   sodium chloride (OCEAN) 0.65 % nasal spray Place into the nose.   SYMBICORT 80-4.5 MCG/ACT inhaler Inhale 2 puffs into the lungs 2 (two) times daily as needed.   torsemide (DEMADEX) 20 MG tablet TAKE TWO TABLETS BY MOUTH TWICE DAILY   traZODone (DESYREL) 50 MG tablet TAKE 1/2 TO 1 TABLET AT BEDTIME AS NEEDED FOR SLEEP   acetaZOLAMIDE (DIAMOX) 250 MG tablet Take by mouth.   spironolactone (ALDACTONE) 25 MG tablet Take 25 mg by mouth daily.    No facility-administered encounter medications on file as of 05/24/2021.    Allergies  Allergen Reactions   Codeine Nausea And Vomiting   Latex Hives and Itching   Lisinopril Cough    Review  of Systems  Constitutional:   Positive for activity change, appetite change, fatigue and unexpected weight change. Negative for chills, diaphoresis and fever.  Respiratory:  Positive for shortness of breath. Negative for apnea, cough, choking, chest tightness and stridor.   Cardiovascular:  Negative for chest pain, palpitations, claudication, leg swelling and near-syncope.  Gastrointestinal:  Negative for abdominal pain.  Genitourinary:  Negative for decreased urine volume and nocturia.  Musculoskeletal:  Negative for muscle weakness.  Neurological:  Positive for weakness (generalized). Negative for dizziness and headaches.  Psychiatric/Behavioral:  Negative for confusion.   All other systems reviewed and are negative.      Objective:  BP 124/74    Pulse 72    Temp 98.1 F (36.7 C) (Temporal)    Ht $R'5\' 8"'Et$  (1.727 m)    Wt 199 lb 6 oz (90.4 kg)    SpO2 (!) 82%    BMI 30.31 kg/m    Wt Readings from Last 3 Encounters:  05/24/21 199 lb 6 oz (90.4 kg)  05/21/21 208 lb 3.2 oz (94.4 kg)  04/19/21 199 lb 12.8 oz (90.6 kg)    Physical Exam Vitals and nursing note reviewed.  Constitutional:      Appearance: He is obese. He is ill-appearing (chronically).  HENT:     Head: Normocephalic and atraumatic.  Eyes:     Conjunctiva/sclera: Conjunctivae normal.     Pupils: Pupils are equal, round, and reactive to light.  Cardiovascular:     Rate and Rhythm: Normal rate and regular rhythm.     Heart sounds: Normal heart sounds. No murmur heard.   No friction rub. No gallop.  Pulmonary:     Effort: Pulmonary effort is normal. No respiratory distress.     Breath sounds: Rales (minimal at bases) present.     Comments: Not wearing oxygen. Oxygen saturation 82% with exertion and 88% at rest.  Musculoskeletal:     Right lower leg: No edema.     Left lower leg: No edema.  Skin:    General: Skin is warm and dry.     Capillary Refill: Capillary refill takes less than 2 seconds.  Neurological:     General: No focal deficit present.      Mental Status: He is alert. Mental status is at baseline.     Gait: Gait abnormal (using walker).  Psychiatric:        Mood and Affect: Mood normal.        Behavior: Behavior normal.    Results for orders placed or performed in visit on 05/21/21  Novel Coronavirus, NAA ([facility])   Specimen: Nasopharyngeal(NP) swabs in vial transport medium  Result Value Ref Range   SARS-CoV-2, NAA Not Detected Not Detected  SARS-COV-2, NAA 2 DAY TAT  Result Value Ref Range   SARS-CoV-2, NAA 2 DAY TAT Performed   Basic Metabolic Panel  Result Value Ref Range   Glucose 214 (H) 70 - 99 mg/dL   BUN 26 8 - 27 mg/dL   Creatinine, Ser 1.66 (H) 0.76 - 1.27 mg/dL   eGFR 41 (L) >59 mL/min/1.73   BUN/Creatinine Ratio 16 10 - 24   Sodium 138 134 - 144 mmol/L   Potassium 4.4 3.5 - 5.2 mmol/L   Chloride 98 96 - 106 mmol/L   CO2 23 20 - 29 mmol/L   Calcium 9.3 8.6 - 10.2 mg/dL  Brain natriuretic peptide  Result Value Ref Range   BNP 773.7 (H) 0.0 - 100.0 pg/mL     X-Ray:  CXR: No acute findings. Preliminary x-ray reading by Monia Pouch, FNP-C, [facility].   Pertinent labs & imaging results that were available during my care of the patient were reviewed by me and considered in my medical decision making.  Assessment & Plan:  Shubh was seen today for congestive heart failure.  Diagnoses and all orders for this visit:  Shortness of breath Acute on chronic combined systolic and diastolic congestive heart failure ([place]) Symptoms have greatly improved now that pt is compliant with diuretic therapy. He is still not wearing oxygen when out of home, educated on importance of compliance with oxygen therapy. Has follow up with cardiology next week. CXR without significant findings, TAVR is visible on imaging. Will repeat BMP and BNP. Pt aware of red flags which require emergent evaluation and treatment.  -     DG Chest 2 View; Future -     BMP8+EGFR -     Brain natriuretic peptide     Continue all other  maintenance medications.  Follow up plan: Return in about 1 month (around 06/21/2021), or if symptoms worsen or fail to improve, for PCP.   Continue healthy lifestyle choices, including diet (rich in fruits, vegetables, and lean proteins, and low in salt and simple carbohydrates) and exercise (at least 30 minutes of moderate physical activity daily).  Educational handout given for Heart Failure  The above assessment and management plan was discussed with the patient. The patient verbalized understanding of and has agreed to the management plan. Patient is aware to call the clinic if they develop any new symptoms or if symptoms persist or worsen. Patient is aware when to return to the clinic for a follow-up visit. Patient educated on when it is appropriate to go to the emergency department.   Monia Pouch, FNP-C [facility] [phone]

## 2021-05-25 LAB — BMP8+EGFR
BUN/Creatinine Ratio: 25 — ABNORMAL HIGH (ref 10–24)
BUN: 43 mg/dL — ABNORMAL HIGH (ref 8–27)
CO2: 27 mmol/L (ref 20–29)
Calcium: 9.5 mg/dL (ref 8.6–10.2)
Chloride: 93 mmol/L — ABNORMAL LOW (ref 96–106)
Creatinine, Ser: 1.69 mg/dL — ABNORMAL HIGH (ref 0.76–1.27)
Glucose: 188 mg/dL — ABNORMAL HIGH (ref 70–99)
Potassium: 4 mmol/L (ref 3.5–5.2)
Sodium: 139 mmol/L (ref 134–144)
eGFR: 41 mL/min/{1.73_m2} — ABNORMAL LOW (ref >59)

## 2021-05-25 LAB — BRAIN NATRIURETIC PEPTIDE: BNP: 490.6 pg/mL — ABNORMAL HIGH (ref 0.0–100.0)

## 2021-05-29 ENCOUNTER — Telehealth: Payer: Medicare Other

## 2021-05-29 DIAGNOSIS — I2510 Atherosclerotic heart disease of native coronary artery without angina pectoris: Secondary | ICD-10-CM | POA: Diagnosis not present

## 2021-05-29 DIAGNOSIS — I5031 Acute diastolic (congestive) heart failure: Secondary | ICD-10-CM | POA: Diagnosis not present

## 2021-05-29 DIAGNOSIS — E782 Mixed hyperlipidemia: Secondary | ICD-10-CM | POA: Diagnosis not present

## 2021-05-29 DIAGNOSIS — I482 Chronic atrial fibrillation, unspecified: Secondary | ICD-10-CM | POA: Diagnosis not present

## 2021-05-29 DIAGNOSIS — Z9181 History of falling: Secondary | ICD-10-CM | POA: Diagnosis not present

## 2021-05-29 DIAGNOSIS — Z952 Presence of prosthetic heart valve: Secondary | ICD-10-CM | POA: Diagnosis not present

## 2021-05-29 DIAGNOSIS — I7143 Infrarenal abdominal aortic aneurysm, without rupture: Secondary | ICD-10-CM | POA: Diagnosis not present

## 2021-06-01 ENCOUNTER — Encounter: Payer: Self-pay | Admitting: Family Medicine

## 2021-06-01 ENCOUNTER — Ambulatory Visit (INDEPENDENT_AMBULATORY_CARE_PROVIDER_SITE_OTHER): Payer: Medicare Other | Admitting: Family Medicine

## 2021-06-01 VITALS — BP 121/69 | HR 72 | Temp 97.7°F | Ht 68.0 in | Wt 201.0 lb

## 2021-06-01 DIAGNOSIS — I5043 Acute on chronic combined systolic (congestive) and diastolic (congestive) heart failure: Secondary | ICD-10-CM

## 2021-06-01 DIAGNOSIS — N1831 Chronic kidney disease, stage 3a: Secondary | ICD-10-CM

## 2021-06-01 DIAGNOSIS — E1122 Type 2 diabetes mellitus with diabetic chronic kidney disease: Secondary | ICD-10-CM

## 2021-06-01 NOTE — Progress Notes (Signed)
Subjective: CC: CHF exacerbation PCP: [name], DO QMG:[id number] Matthew Campos is a 81 y.o. male who is accompanied today's visit by his wife.  He is presenting to clinic today for:  1.  CHF exacerbation Patient recently seen by his cardiologist and determined to have an acute on chronic CHF exacerbation.  His Demadex was arranged to 2 tablets by mouth twice daily.  He was continued on Aldactone.  His wife notes that his weight has still gone up a couple of pounds but they have not reached out to his cardiologist yet.  He is currently on 2 L of oxygen via nasal cannula as he was desaturating without it.  He reports good urine output with the diuretic.  No chest pain reported.  2.  Diabetes Patient is compliant with medication which includes glipizide 5 mg twice daily, Farxiga 10 mg, Rybelsus 7 mg.  Blood sugars are running 150s to 160s typically.  Urine output as above.  No reports of visual disturbance.   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    Anemia    Anxiety and depression    Aortic stenosis    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HCC)    CHF (congestive heart failure) (HCC)    Cholecystitis    Chronic ischemic heart disease    Complication of anesthesia    hard to be put to sleep   COPD (chronic obstructive pulmonary disease) ([place])    Coronary artery disease    Diabetes mellitus without complication ([place])    Diabetes mellitus without complication ([place])    takes Januvia and Metformin daily   GERD (gastroesophageal reflux disease)    GERD (gastroesophageal reflux disease)    takes omeprazole daily   Headache(784.0)    sinus   History of bronchitis    last time >97yrs ago   Hyperlipidemia    takes Lipitor daily   Hypertension    Hypertension     takes Metoprolol daily   Infected sebaceous cyst 01/30/2021   Joint pain    Myocardial infarction ([place])    x 3;last one about 3-64yrs ago   Neoplasm of uncertain behavior of bladder    Obesity    OSA (obstructive sleep apnea)    Pacemaker    Pancytopenia (HCC)    Paroxysmal atrial fibrillation (HCC)    Pneumonia    last itme about 71yrs ago   S/P TAVR (transcatheter aortic valve replacement)    Severe aortic stenosis    Sinus bradycardia     Current Outpatient Medications:    albuterol (PROVENTIL) (2.5 MG/3ML) 0.083% nebulizer solution, Take 2.5 mg by nebulization every 6 (six) hours as needed for wheezing or shortness of breath., Disp: , Rfl:    albuterol (VENTOLIN HFA) 108 (90 Base) MCG/ACT inhaler, INHALE 2 PUFFS INTO THE LUNGS EVERY 6 HOURS AS NEEDED FOR WHEEZING OR SHORTNESS OF BREATH, Disp: 8.5 g, Rfl: 1   aspirin EC 81 MG tablet, Take 81 mg by mouth daily., Disp: , Rfl:    azelastine (ASTELIN) 0.1 % nasal spray, Place 1 spray into both nostrils 2 (two) times daily. For runny nose, Disp: 30 mL, Rfl: 12   Blood Glucose Monitoring Suppl (BLOOD GLUCOSE MONITOR SYSTEM) w/Device KIT, 1 Device by Does  not apply route 2 (two) times daily. Dx E11.9, Disp: 1 kit, Rfl: 0   calcium carbonate (OSCAL) 1500 (600 Ca) MG TABS tablet, Take by mouth 2 (two) times daily with a meal., Disp: , Rfl:    clopidogrel (PLAVIX) 75 MG tablet, TAKE ONE (1) TABLET EACH DAY, Disp: 90 tablet, Rfl: 0   clotrimazole-betamethasone (LOTRISONE) cream, Apply 1 application topically 2 (two) times daily. To torso For up to 14 days, Disp: 45 g, Rfl: 1   dapagliflozin propanediol (FARXIGA) 10 MG TABS tablet, TAKE ONE TABLET EACH MORNING BEFORE BREAKFAST, Disp: 30 tablet, Rfl: 12   dextromethorphan-guaiFENesin (MUCINEX DM) 30-600 MG 12hr tablet, Take 1 tablet by mouth 2 (two) times daily., Disp: 30 tablet, Rfl: 0   donepezil (ARICEPT) 5 MG tablet, Take 5 mg (1 pill) daily for 4 weeks, then increase to 10 mg (2 pills) daily,  Disp: 60 tablet, Rfl: 2   ferrous sulfate 325 (65 FE) MG EC tablet, Take 325 mg by mouth every Monday, Wednesday, and Friday. , Disp: , Rfl:    glipiZIDE (GLUCOTROL) 5 MG tablet, Take 1 tablet (5 mg total) by mouth 2 (two) times daily before a meal., Disp: 180 tablet, Rfl: 3   glucose blood (COOL BLOOD GLUCOSE TEST STRIPS) test strip, Use to check BS BID dx E11.9, Disp: 100 each, Rfl: 3   Lancet Device MISC, Use to check BS BID Dx E11.9, Disp: 100 each, Rfl: 3   levothyroxine (SYNTHROID) 100 MCG tablet, TAKE ONE (1) TABLET EACH DAY, Disp: 90 tablet, Rfl: 3   lidocaine (LIDODERM) 5 %, Place 1 patch onto the skin daily. To the area of back pain. Remove & Discard patch within 12 hours or as directed by MD, Disp: 30 patch, Rfl: 0   loperamide (IMODIUM) 2 MG capsule, TAKE 2 CAPSULES BY MOUTH AS NEEDED FOR DIARRHEA/LOOSE STOOL. FOLLOWED BY 1 CAPSULE AFTER EACH LOOSE STOOL (MAX OF 8 CAPSULES DAILY), Disp: 30 capsule, Rfl: 1   lubiprostone (AMITIZA) 24 MCG capsule, TAKE 1 CAPSULE TWICE A DAY WITH MEALS., Disp: 60 capsule, Rfl: 2   methocarbamol (ROBAXIN) 500 MG tablet, Take 0.5-1 tablets (250-500 mg total) by mouth 2 (two) times daily as needed for muscle spasms (back pain)., Disp: 30 tablet, Rfl: 0   mirtazapine (REMERON) 15 MG tablet, Take 1 tablet (15 mg total) by mouth at bedtime., Disp: 90 tablet, Rfl: 1   Multiple Vitamin (MULTIVITAMIN WITH MINERALS) TABS, Take 1 tablet by mouth daily., Disp: , Rfl:    nitroGLYCERIN (NITROSTAT) 0.4 MG SL tablet, DISSOLVE 1 TAB UNDER TOUNGE FOR CHEST PAIN. MAY REPEAT EVERY 5 MINUTES FOR 3 DOSES. IF NO RELIEF CALL 911 OR GO TO ER, Disp: 25 tablet, Rfl: 2   ondansetron (ZOFRAN-ODT) 4 MG disintegrating tablet, TAKE 1 TABLET BY MOUTH EVERY 8 HOURS AS NEEDED FOR NAUSEA & VOMITING, Disp: 30 tablet, Rfl: 0   pantoprazole (PROTONIX) 40 MG tablet, TAKE ONE (1) TABLET EACH DAY, Disp: 90 tablet, Rfl: 0   rosuvastatin (CRESTOR) 10 MG tablet, TAKE ONE (1) TABLET EACH DAY, Disp: 90  tablet, Rfl: 3   Semaglutide (RYBELSUS) 7 MG TABS, Take 7 mg by mouth daily., Disp: 30 tablet, Rfl: 12   sodium chloride (OCEAN) 0.65 % nasal spray, Place into the nose., Disp: , Rfl:    SYMBICORT 80-4.5 MCG/ACT inhaler, Inhale 2 puffs into the lungs 2 (two) times daily as needed., Disp: , Rfl:    torsemide (DEMADEX) 20 MG tablet, TAKE TWO TABLETS BY MOUTH TWICE  DAILY, Disp: 120 tablet, Rfl: 2   traZODone (DESYREL) 50 MG tablet, TAKE 1/2 TO 1 TABLET AT BEDTIME AS NEEDED FOR SLEEP, Disp: 90 tablet, Rfl: 1   acetaZOLAMIDE (DIAMOX) 250 MG tablet, Take by mouth., Disp: , Rfl:    spironolactone (ALDACTONE) 25 MG tablet, Take 25 mg by mouth daily. , Disp: , Rfl:  Social History   Socioeconomic History   Marital status: Married    Spouse name: Not on file   Number of children: Not on file   Years of education: Not on file   Highest education level: Not on file  Occupational History   Occupation: retired  Tobacco Use   Smoking status: Former   Smokeless tobacco: Never   Tobacco comments:    quit smoking 20+yrs ago  Scientific laboratory technician Use: Never used  Substance and Sexual Activity   Alcohol use: No    Comment: last drank about 2-3 years ago   Drug use: No   Sexual activity: Yes  Other Topics Concern   Not on file  Social History Narrative   Lives with his wife - their grandson lives with them at times       Social Determinants of Health   Financial Resource Strain: Low Risk    Difficulty of Paying Living Expenses: Not very hard  Food Insecurity: No Food Insecurity   Worried About Charity fundraiser in the Last Year: Never true   [place] in the Last Year: Never true  Transportation Needs: No Transportation Needs   Lack of Transportation (Medical): No   Lack of Transportation (Non-Medical): No  Physical Activity: Inactive   Days of Exercise per Week: 0 days   Minutes of Exercise per Session: 0 min  Stress: Stress Concern Present   Feeling of Stress : To some extent   Social Connections: Moderately Isolated   Frequency of Communication with Friends and Family: Twice a week   Frequency of Social Gatherings with Friends and Family: Twice a week   Attends Religious Services: Never   Marine scientist or Organizations: No   Attends Music therapist: Never   Marital Status: Married  Human resources officer Violence: Not At Risk   Fear of Current or Ex-Partner: No   Emotionally Abused: No   Physically Abused: No   Sexually Abused: No   Family History  Problem Relation Age of Onset   Cancer Mother        Unknown type   Heart disease Father    Diabetes Father    Heart disease Sister    Seizures Brother    Diabetes Daughter    Diabetes Daughter    Heart attack Son    Colon cancer Mother        in her 91s.    Stomach cancer Neg Hx    Esophageal cancer Neg Hx     Objective: Office vital signs reviewed. BP 121/69    Pulse 72    Temp 97.7 F (36.5 C)    Ht $R'5\' 8"'fm$  (1.727 m)    Wt 201 lb (91.2 kg)    SpO2 93%    BMI 30.56 kg/m   Physical Examination:  General: Awake, alert, chronically ill-appearing male, No acute distress HEENT: Sclera white Cardio: regular rate and rhythm, S1S2 heard, no murmurs appreciated Pulm: clear to auscultation bilaterally, no wheezes, rhonchi or rales; normal work of breathing on supplemental oxygen Extremities: Pedal edema still present    Assessment/  Plan: 81 y.o. male   Acute on chronic combined systolic and diastolic congestive heart failure ([place])  Type 2 diabetes mellitus with stage 3a chronic kidney disease, without long-term current use of insulin ([place])  He sounds like he is gaining some fluid from a weight standpoint but his lung exam did not demonstrate the rales that works appreciated on our last visit.  I advised him to continue monitoring weight very closely over the weekend and if this escalated he has a low threshold to contact cardiology for further instruction.  May need to consider  alternative diuretics.  His wife understands red flag signs and symptoms warranting further evaluation in the ER  We will plan for A1c at her next visit.  His last A1c was 8.5.  May need to consider advancing the Rybelsus to 14 mg.  No orders of the defined types were placed in this encounter.  No orders of the defined types were placed in this encounter.    [name], DO [place] [phone]

## 2021-06-03 DIAGNOSIS — J449 Chronic obstructive pulmonary disease, unspecified: Secondary | ICD-10-CM | POA: Diagnosis not present

## 2021-06-04 ENCOUNTER — Ambulatory Visit (INDEPENDENT_AMBULATORY_CARE_PROVIDER_SITE_OTHER): Payer: Medicare Other

## 2021-06-04 DIAGNOSIS — N1831 Chronic kidney disease, stage 3a: Secondary | ICD-10-CM

## 2021-06-04 DIAGNOSIS — J449 Chronic obstructive pulmonary disease, unspecified: Secondary | ICD-10-CM

## 2021-06-04 DIAGNOSIS — K219 Gastro-esophageal reflux disease without esophagitis: Secondary | ICD-10-CM

## 2021-06-04 DIAGNOSIS — E782 Mixed hyperlipidemia: Secondary | ICD-10-CM

## 2021-06-04 DIAGNOSIS — E1159 Type 2 diabetes mellitus with other circulatory complications: Secondary | ICD-10-CM

## 2021-06-04 DIAGNOSIS — I152 Hypertension secondary to endocrine disorders: Secondary | ICD-10-CM

## 2021-06-04 DIAGNOSIS — D414 Neoplasm of uncertain behavior of bladder: Secondary | ICD-10-CM

## 2021-06-04 DIAGNOSIS — G4733 Obstructive sleep apnea (adult) (pediatric): Secondary | ICD-10-CM

## 2021-06-04 DIAGNOSIS — I5043 Acute on chronic combined systolic (congestive) and diastolic (congestive) heart failure: Secondary | ICD-10-CM

## 2021-06-04 DIAGNOSIS — F331 Major depressive disorder, recurrent, moderate: Secondary | ICD-10-CM

## 2021-06-04 DIAGNOSIS — E1122 Type 2 diabetes mellitus with diabetic chronic kidney disease: Secondary | ICD-10-CM

## 2021-06-04 NOTE — Chronic Care Management (AMB) (Signed)
Chronic Care Management    Clinical Social Work Note  06/04/2021 Name: Matthew Campos MRN: [id number] DOB: Sep 26, 1940  Matthew Campos is a 81 y.o. year old male who is a primary care patient of Matthew Norlander, DO. The CCM team was consulted to assist the patient with chronic disease management and/or care coordination needs related to: [employer] .   Engaged with patient / spouse of patient, [place], by telephone for follow up visit in response to provider referral for social work chronic care management and care coordination services.   Consent to Services:  The patient was given information about Chronic Care Management services, agreed to services, and gave verbal consent prior to initiation of services.  Please see initial visit note for detailed documentation.   Patient agreed to services and consent obtained.   Assessment: Review of patient past medical history, allergies, medications, and health status, including review of relevant consultants reports was performed today as part of a comprehensive evaluation and provision of chronic care management and care coordination services.     SDOH (Social Determinants of Health) assessments and interventions performed:  SDOH Interventions    Flowsheet Row Most Recent Value  SDOH Interventions   Physical Activity Interventions Other (Comments)  [walking challenges]  Stress Interventions Other (Comment)  [client has stress related to managing medical needs]  Depression Interventions/Treatment  --  [informed Matthew Campos, spouse of client, about LCSW support for client and about RNCM support for client]        Advanced Directives Status: See Vyn[employer] application for related entries.  CCM Care Plan  Allergies  Allergen Reactions   Codeine Nausea And Vomiting   Latex Hives and Itching   Lisinopril Cough    Outpatient Encounter Medications as of 06/04/2021  Medication Sig Note   acetaZOLAMIDE (DIAMOX)  250 MG tablet Take by mouth.    albuterol (PROVENTIL) (2.5 MG/3ML) 0.083% nebulizer solution Take 2.5 mg by nebulization every 6 (six) hours as needed for wheezing or shortness of breath.    albuterol (VENTOLIN HFA) 108 (90 Base) MCG/ACT inhaler INHALE 2 PUFFS INTO THE LUNGS EVERY 6 HOURS AS NEEDED FOR WHEEZING OR SHORTNESS OF BREATH    aspirin EC 81 MG tablet Take 81 mg by mouth daily.    azelastine (ASTELIN) 0.1 % nasal spray Place 1 spray into both nostrils 2 (two) times daily. For runny nose    Blood Glucose Monitoring Suppl (BLOOD GLUCOSE MONITOR SYSTEM) w/Device KIT 1 Device by Does not apply route 2 (two) times daily. Dx E11.9    calcium carbonate (OSCAL) 1500 (600 Ca) MG TABS tablet Take by mouth 2 (two) times daily with a meal.    clopidogrel (PLAVIX) 75 MG tablet TAKE ONE (1) TABLET EACH DAY    clotrimazole-betamethasone (LOTRISONE) cream Apply 1 application topically 2 (two) times daily. To torso For up to 14 days    dapagliflozin propanediol (FARXIGA) 10 MG TABS tablet TAKE ONE TABLET EACH MORNING BEFORE BREAKFAST    dextromethorphan-guaiFENesin (MUCINEX DM) 30-600 MG 12hr tablet Take 1 tablet by mouth 2 (two) times daily.    donepezil (ARICEPT) 5 MG tablet Take 5 mg (1 pill) daily for 4 weeks, then increase to 10 mg (2 pills) daily 03/01/2021: 03/01/21 approved as of 02/23/21   ferrous sulfate 325 (65 FE) MG EC tablet Take 325 mg by mouth every Monday, Wednesday, and Friday.     glipiZIDE (GLUCOTROL) 5 MG tablet Take 1 tablet (5 mg total) by mouth 2 (  two) times daily before a meal.    glucose blood (COOL BLOOD GLUCOSE TEST STRIPS) test strip Use to check BS BID dx E11.9    Lancet Device MISC Use to check BS BID Dx E11.9    levothyroxine (SYNTHROID) 100 MCG tablet TAKE ONE (1) TABLET EACH DAY    lidocaine (LIDODERM) 5 % Place 1 patch onto the skin daily. To the area of back pain. Remove & Discard patch within 12 hours or as directed by MD    loperamide (IMODIUM) 2 MG capsule TAKE 2  CAPSULES BY MOUTH AS NEEDED FOR DIARRHEA/LOOSE STOOL. FOLLOWED BY 1 CAPSULE AFTER EACH LOOSE STOOL (MAX OF 8 CAPSULES DAILY)    lubiprostone (AMITIZA) 24 MCG capsule TAKE 1 CAPSULE TWICE A DAY WITH MEALS.    methocarbamol (ROBAXIN) 500 MG tablet Take 0.5-1 tablets (250-500 mg total) by mouth 2 (two) times daily as needed for muscle spasms (back pain).    mirtazapine (REMERON) 15 MG tablet Take 1 tablet (15 mg total) by mouth at bedtime.    Multiple Vitamin (MULTIVITAMIN WITH MINERALS) TABS Take 1 tablet by mouth daily.    nitroGLYCERIN (NITROSTAT) 0.4 MG SL tablet DISSOLVE 1 TAB UNDER TOUNGE FOR CHEST PAIN. MAY REPEAT EVERY 5 MINUTES FOR 3 DOSES. IF NO RELIEF CALL 911 OR GO TO ER    ondansetron (ZOFRAN-ODT) 4 MG disintegrating tablet TAKE 1 TABLET BY MOUTH EVERY 8 HOURS AS NEEDED FOR NAUSEA & VOMITING    pantoprazole (PROTONIX) 40 MG tablet TAKE ONE (1) TABLET EACH DAY    rosuvastatin (CRESTOR) 10 MG tablet TAKE ONE (1) TABLET EACH DAY    Semaglutide (RYBELSUS) 7 MG TABS Take 7 mg by mouth daily.    sodium chloride (OCEAN) 0.65 % nasal spray Place into the nose.    spironolactone (ALDACTONE) 25 MG tablet Take 25 mg by mouth daily.     SYMBICORT 80-4.5 MCG/ACT inhaler Inhale 2 puffs into the lungs 2 (two) times daily as needed.    torsemide (DEMADEX) 20 MG tablet TAKE TWO TABLETS BY MOUTH TWICE DAILY    traZODone (DESYREL) 50 MG tablet TAKE 1/2 TO 1 TABLET AT BEDTIME AS NEEDED FOR SLEEP    No facility-administered encounter medications on file as of 06/04/2021.    Patient Active Problem List   Diagnosis Date Noted   Alzheimer's dementia without behavioral disturbance ([place]) 04/19/2021   Acquired hypothyroidism 04/19/2021   Diarrhea 03/26/2021   Hyperlipidemia associated with type 2 diabetes mellitus ([place]) 02/04/2020   CAP (community acquired pneumonia) 09/07/2019   Hypomagnesemia 09/07/2019   CKD (chronic kidney disease), stage III ([place]) 09/07/2019   Volume overload 09/07/2019   Community  acquired pneumonia 09/07/2019   GI bleeding 04/14/2019   Acute on chronic diastolic heart failure (HCC)    Bradycardia    Renal insufficiency    Chest pain 04/13/2019   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/13/2019   Cholecystostomy care ([place]) 04/13/2019   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   COPD (chronic obstructive pulmonary disease) (HCC)    Coronary artery disease    Chronic ischemic heart disease    Paroxysmal atrial fibrillation (HCC)    OSA (obstructive sleep apnea)    Coagulopathy (HCC)    Chronic anticoagulation    Dysphagia    Elevated alkaline phosphatase measurement    Elevated bilirubin    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   Sinus bradycardia 02/18/2019   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   Normocytic  anemia 12/23/2018   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HCC) 12/23/2018   Severe aortic stenosis 11/26/2018   Anxiety 10/28/2018   CHF (congestive heart failure) ([place]) 10/28/2018   At risk for falls 10/28/2018   Oxygen dependent 10/28/2018   Major depressive disorder, single episode, severe without psychotic features ([place]) 07/21/2017   Thrombocytopenia ([place]) 07/21/2017   CAD (coronary artery disease) 12/27/2015   Gastroesophageal reflux disease 12/27/2015   Hypertension associated with diabetes ([place]) 12/27/2015   Hyperlipidemia 09/09/2013   Type 2 diabetes mellitus ([place]) 09/09/2013    Conditions to be addressed/monitored: monitor client management of depression issues  Care Plan : LCSW Care Plan  Updates made by Katha Cabal, LCSW since 06/04/2021 12:00 AM     Problem: Emotional Distress      Goal: Emotional Health Supported; Manage Depression symptoms faced   Start Date: 06/04/2021  Expected End Date: 08/30/2021  This Visit's Progress: On track  Recent Progress: On track  Priority: Medium  Note:   Current Barriers:  Mental Health needs related to depression and depression  management Mobility issues Memory challenges Suicidal Ideation/Homicidal Ideation: No  Clinical Social Work Goal(s):  patient will work with SW monthly by telephone or in person to reduce or manage symptoms related to depression and depression management Client to call RNCM as needed in next 30 days to address nursing needs of client Patient will communicate with LCSW in next 30 days to discuss mobility challenges of client  Interventions: 1:1 collaboration with Matthew Norlander, DO regarding development and update of comprehensive plan of care as evidenced by provider attestation and co-signature Reviewed transport needs of client with Matthew Campos, spouse of client. [name] helps transport client as needed to appointments Reviewed family support  for [place] with [name].  [name] is supportive of client. Client's son is also supportive of client. Granddaughter of client has also been helping to care for needs of client Discussed ambulation of client with [name].  [name] said client is trying to obtain motorize scooter.  She said that client will have to make monthly payments on electric scooter for one years. She thinks if client can obtain an electric scooter is will help him with ambulation Reviewed client oxygen use. [name] said client is using oxygen 24/7 continuously now.  She said also that client is using oxygen when he goes into the community Discussed client energy level. [name] said client has reduced energy Reviewed medication procurement for client with [name] Reviewed mood of client. She said client is sad occasionally. She said client is irritable occasionally. He watches TV westerns for relaxation.  She said client does not always sleep very well..   Encouraged [name] to call RNCM to discuss nursing needs of client Encouraged [name] to call LCSW as needed in next 30 days to discuss social work needs of client  Patient Self Care Activities:  Completes  ADLs as able Attends scheduled medical appointments  Patient Coping Strengths:  Support received from his wife, [place] received from his son.  Patient Self Care Deficits:  Walking challenges  Patient Goals:  - spend time or talk with others at least 2 to 3 times per week - practice relaxation or meditation daily - keep a calendar with appointment dates  Follow Up Plan: LCSW to call client or spouse of client on 07/30/21 at 9:00 AM to assess client needs      [name].Burley Kopka MSW, [place] Holiday representative [facility] Care Management [phone]

## 2021-06-04 NOTE — Patient Instructions (Addendum)
Visit Information  Patient Goals:  Manage Emotions: Manage Depression symptoms  Timeframe:  Short-Term Goal Priority:  Medium Progress: On Track Start Date:           06/04/21                   Expected End Date:          08/30/21                Follow Up Date 07/30/21 at 9:00 AM   Manage Emotions. Manage Depression symptoms    Why is this important?   When you are stressed, down or upset, your body reacts too.  For example, your blood pressure may get higher; you may have a headache or stomachache.  When your emotions get the best of you, your body's ability to fight off cold and flu gets weak.  These steps will help you manage your emotions.     Patient Self Care Activities:  Completes ADLs as able Attends scheduled medical appointments  Patient Coping Strengths:  Support received from his wife, [place] received from his son.  Patient Self Care Deficits:  Walking challenges  Patient Goals:  - spend time or talk with others at least 2 to 3 times per week - practice relaxation or meditation daily - keep a calendar with appointment dates  Follow Up Plan: LCSW to call client or spouse of client on 07/30/21 at 9:00 AM to assess client needs   [name].Linnette Panella MSW, [place] Holiday representative [facility] Care Management [phone]

## 2021-06-12 DIAGNOSIS — I152 Hypertension secondary to endocrine disorders: Secondary | ICD-10-CM

## 2021-06-12 DIAGNOSIS — E782 Mixed hyperlipidemia: Secondary | ICD-10-CM

## 2021-06-12 DIAGNOSIS — F331 Major depressive disorder, recurrent, moderate: Secondary | ICD-10-CM

## 2021-06-12 DIAGNOSIS — N1831 Chronic kidney disease, stage 3a: Secondary | ICD-10-CM

## 2021-06-12 DIAGNOSIS — I5043 Acute on chronic combined systolic (congestive) and diastolic (congestive) heart failure: Secondary | ICD-10-CM

## 2021-06-12 DIAGNOSIS — E1122 Type 2 diabetes mellitus with diabetic chronic kidney disease: Secondary | ICD-10-CM

## 2021-06-12 DIAGNOSIS — J449 Chronic obstructive pulmonary disease, unspecified: Secondary | ICD-10-CM

## 2021-06-12 DIAGNOSIS — E1159 Type 2 diabetes mellitus with other circulatory complications: Secondary | ICD-10-CM

## 2021-06-15 ENCOUNTER — Other Ambulatory Visit: Payer: Self-pay | Admitting: Family Medicine

## 2021-06-15 DIAGNOSIS — R197 Diarrhea, unspecified: Secondary | ICD-10-CM

## 2021-06-15 NOTE — Telephone Encounter (Signed)
Last office visit 06/01/21 ?Last refills: ?Plavix 03/26/21, #90, no refills ?Immodium 05/16/21, #30, 1 refill ?Zofran 05/21/21, #30, no refills ?

## 2021-06-16 ENCOUNTER — Other Ambulatory Visit: Payer: Self-pay | Admitting: Family Medicine

## 2021-06-16 DIAGNOSIS — G8929 Other chronic pain: Secondary | ICD-10-CM

## 2021-06-16 DIAGNOSIS — M545 Low back pain, unspecified: Secondary | ICD-10-CM

## 2021-06-17 NOTE — Telephone Encounter (Signed)
Last office visit 06/01/21 ?Last refill 05/21/21, #30, no refills ?

## 2021-06-19 ENCOUNTER — Ambulatory Visit: Payer: Medicare Other | Admitting: Physical Therapy

## 2021-06-20 DIAGNOSIS — M545 Low back pain, unspecified: Secondary | ICD-10-CM | POA: Insufficient documentation

## 2021-06-21 ENCOUNTER — Encounter: Payer: Self-pay | Admitting: Physical Therapy

## 2021-06-21 ENCOUNTER — Other Ambulatory Visit: Payer: Self-pay

## 2021-06-21 ENCOUNTER — Ambulatory Visit: Payer: Medicare Other | Attending: Family Medicine | Admitting: Physical Therapy

## 2021-06-21 DIAGNOSIS — M545 Low back pain, unspecified: Secondary | ICD-10-CM | POA: Diagnosis not present

## 2021-06-21 DIAGNOSIS — G8929 Other chronic pain: Secondary | ICD-10-CM | POA: Insufficient documentation

## 2021-06-21 DIAGNOSIS — M6281 Muscle weakness (generalized): Secondary | ICD-10-CM | POA: Diagnosis not present
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
## 2021-06-26 ENCOUNTER — Other Ambulatory Visit: Payer: Self-pay

## 2021-06-26 ENCOUNTER — Ambulatory Visit: Payer: Medicare Other | Admitting: Physical Therapy

## 2021-06-26 ENCOUNTER — Encounter: Payer: Self-pay | Admitting: Physical Therapy

## 2021-06-26 DIAGNOSIS — M545 Low back pain, unspecified: Secondary | ICD-10-CM | POA: Diagnosis not present

## 2021-06-26 DIAGNOSIS — G8929 Other chronic pain: Secondary | ICD-10-CM | POA: Diagnosis not present

## 2021-06-26 DIAGNOSIS — M6281 Muscle weakness (generalized): Secondary | ICD-10-CM

## 2021-06-26 NOTE — Therapy (Signed)
[facility] ?[place] ?[place], [place], [address] ?Phone: [phone]   Fax:  [phone] ? ?Physical Therapy Treatment ? ?Patient Details  ?Name: Matthew Campos ?MRN: [id number] ?Date of Birth: 29-Jan-1941 ?Referring Provider (PT): Ronnie Doss DO ? ? ?Encounter Date: 06/26/2021 ? ? PT End of Session - 06/26/21 1351   ? ? Visit Number 2   ? Number of Visits 12   ? Date for PT Re-Evaluation 09/19/21   ? Authorization Type FOTO AT LEAST EVERY 5TH VISIT.  PROGRESS NOTE AT 10TH VISIT.  KX MODIFIER AFTER 15 VISITS.   ? PT Start Time 1348   ? PT Stop Time 1430   ? PT Time Calculation (min) 42 min   ? Activity Tolerance Patient tolerated treatment well   ? Behavior During Therapy [facility] for tasks assessed/performed   ? ?  ?  ? ?  ? ? ?Past Medical History:  ?Diagnosis Date  ? AAA (abdominal aortic aneurysm) without rupture   ? repaired  ? 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 Anemia   ? Anxiety and depression   ? Aortic stenosis   ? 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[place])   ? CHF (congestive heart failure) ([place])   ? Cholecystitis   ? Chronic ischemic heart disease   ? Complication of anesthesia   ? hard to be put to sleep  ? COPD (chronic obstructive pulmonary disease) ([place])   ? Coronary artery disease   ? Diabetes mellitus without complication ([place])   ? Diabetes mellitus without complication ([place])   ? takes Januvia and Metformin daily  ? GERD (gastroesophageal reflux disease)   ? GERD (gastroesophageal reflux disease)   ? takes omeprazole daily  ? Headache(784.0)   ? sinus  ? History of bronchitis   ? last time >5yr ago  ? Hyperlipidemia   ? takes Lipitor daily  ? Hypertension   ? Hypertension   ? takes Metoprolol daily  ? Infected sebaceous cyst 01/30/2021  ? Joint pain   ? Myocardial infarction ([place]   ? x 3;last one about 3-470yrago  ? Neoplasm of uncertain behavior of bladder   ? Obesity   ? OSA (obstructive sleep apnea)   ? Pacemaker   ? Pancytopenia ([place]  ?  Paroxysmal atrial fibrillation (HCC)   ? Pneumonia   ? last itme about 9y34yrgo  ? S/P TAVR (transcatheter aortic valve replacement)   ? Severe aortic stenosis   ? Sinus bradycardia   ? ? ?Past Surgical History:  ?Procedure Laterality Date  ? abdominal aneurysm stenting    ? ABDOMINAL AORTIC ANEURYSM REPAIR    ? per patient stents placed about 4-5 years ago  ? AORTIC VALVE REPLACEMENT    ? stated it was done in November  ? cholecystostomy tube    ? CORONARY ANGIOPLASTY  2010  ? CORONARY ANGIOPLASTY WITH STENT PLACEMENT    ? about 30 years ago per patient  ? cyst removed from left wrist    ? ESOPHAGOGASTRODUODENOSCOPY (EGD) WITH PROPOFOL N/A 04/15/2019  ? Procedure: ESOPHAGOGASTRODUODENOSCOPY (EGD) WITH PROPOFOL;  Surgeon: FieDanie BinderD;  [facility];  Service: Endoscopy;  Laterality: N/A;  possible dilation  ? HERNIA REPAIR    ? left shoulder surgery    ? POLYPECTOMY  04/15/2019  ? Procedure: POLYPECTOMY;  Surgeon: FieDanie BinderD;  [facility];  Service: Endoscopy;;  duodenal ?  ? SHOULDER ARTHROSCOPY WITH ROTATOR CUFF REPAIR AND SUBACROMIAL  DECOMPRESSION  04/17/2012  ? Procedure: SHOULDER ARTHROSCOPY WITH ROTATOR CUFF REPAIR AND SUBACROMIAL DECOMPRESSION;  Surgeon: Yvette Rack., MD;  Location: [facility];  Service: Orthopedics;  Laterality: Right;  RIGHT SHOULDER ROTATOR CUFF REPAIR INCLUDING ACROMIOPLASTY CHRONIC, ARTHROSCOPY SHOULDER DEBRIDEMENT EXTENSIVE  ? TONSILLECTOMY    ? VALVE REPLACEMENT    ? ? ?There were no vitals filed for this visit. ? ? Subjective Assessment - 06/26/21 1350   ? ? Subjective Denies any pain upon he was out playing with his dog before coming. Reports 9/10 pain.   ? Pertinent History MI x 3; AAA, DM, TAVR, hernia repair, RTC repair, CAD, COPD, cardiac stent.   ? How long can you sit comfortably? Varies.   ? How long can you stand comfortably? Varies.   ? How long can you walk comfortably? Short community distances.   ? Patient Stated Goals Reduce pain.   ?  Currently in Pain? Yes   ? Pain Score 9    ? Pain Location Back   ? Pain Orientation Lower   ? Pain Descriptors / Indicators Discomfort   ? Pain Type Chronic pain   ? Pain Onset More than a month ago   ? Pain Frequency Constant   ? ?  ?  ? ?  ? ? ? ? ? [facility] PT Assessment - 06/26/21 0001   ? ?  ? Assessment  ? Medical Diagnosis Chronic LBP.   ? Referring Provider (PT) Ronnie Doss DO   ?  ? Precautions  ? Precautions Fall   ? ?  ?  ? ?  ? ? ? ? ? ? ? ? ? ? ? ? ? ? ? ? [facility] Adult PT Treatment/Exercise - 06/26/21 0001   ? ?  ? Exercises  ? Exercises Lumbar   ?  ? Lumbar Exercises: Aerobic  ? Nustep L3 x15 min   ?  ? Modalities  ? Modalities Electrical Stimulation   ?  ? Electrical Stimulation  ? Electrical Stimulation Location Left LB   ? Electrical Stimulation Action Pre-Mod   ? Electrical Stimulation Parameters 80-150 hz x10 min   ? Electrical Stimulation Goals Tone;Pain   ?  ? Manual Therapy  ? Manual Therapy Soft tissue mobilization   ? Soft tissue mobilization STW to L QL, lumbar paraspinals to reduce tone and pain   ? ?  ?  ? ?  ? ? ? ? ? ? ? ? ? ? ? ? ? ? ? PT Long Term Goals - 06/21/21 1522   ? ?  ? PT LONG TERM GOAL #1  ? Title Independent with HEP.   ? Time 6   ? Period Weeks   ? Status New   ?  ? PT LONG TERM GOAL #2  ? Title Perform ADL's with pain not > 3/10.   ? Time 6   ? Period Weeks   ? Status New   ?  ? PT LONG TERM GOAL #3  ? Title Eliminate radiation of pain into left hip region.   ? Time 6   ? Period Weeks   ? Status New   ? ?  ?  ? ?  ? ? ? ? ? ? ? ? Plan - 06/26/21 1425   ? ? Clinical Impression Statement Patient presented in clinic with reports of increased LBP prior to coming to PT. Increased tone of the L QL and lumbar paraspinals but no complaints of palpable soreness or tenderness with manual  therapy. Patient using [facility] for ambulatory use. Patient indicated that he was playing with his dog and laying in the bed and upon rising had more pain. Normal stimulation response noted following  removal of the modality.   ? Personal Factors and Comorbidities Comorbidity 1;Comorbidity 2   ? Comorbidities MI x 3; AAA, DM, TAVR, hernia repair, RTC repair, CAD, COPD, cardiac stent.   ? Examination-Activity Limitations Other;Locomotion Level   ? Examination-Participation Restrictions Other;[place]aver Work   ? Stability/Clinical Decision Making Stable/Uncomplicated   ? Rehab Potential Good   ? PT Frequency 2x / week   ? PT Duration 6 weeks   ? PT Treatment/Interventions ADLs/Self Care Home Management;Cryotherapy;Electrical Stimulation;Ultrasound;Moist Heat;Functional mobility training;Therapeutic activities;Therapeutic exercise;Manual techniques;Patient/family education;Passive range of motion   ? PT Next Visit Plan STW/M and modalities as needed.  Core exercise progression.   ? Consulted and Agree with Plan of Care Patient   ? ?  ?  ? ?  ? ? ?Patient will benefit from skilled therapeutic intervention in order to improve the following deficits and impairments:  Decreased activity tolerance, Decreased mobility, Decreased range of motion, Decreased strength, Increased muscle spasms, Pain, Postural dysfunction ? ?Visit Diagnosis: ?Chronic left-sided low back pain, unspecified whether sciatica present ? ?Muscle weakness (generalized) ? ? ? ? ?Problem List ?Patient Active Problem List  ? Diagnosis Date Noted  ? Alzheimer's dementia without behavioral disturbance ([place]) 04/19/2021  ? Acquired hypothyroidism 04/19/2021  ? Diarrhea 03/26/2021  ? Hyperlipidemia associated with type 2 diabetes mellitus ([employer]) 02/04/2020  ? CAP (community acquired pneumonia) 09/07/2019  ? Hypomagnesemia 09/07/2019  ? CKD (chronic kidney disease), stage III ([place]) 09/07/2019  ? Volume overload 09/07/2019  ? Community acquired pneumonia 09/07/2019  ? GI bleeding 04/14/2019  ? Acute on chronic diastolic heart failure ([place])   ? Bradycardia   ? Renal insufficiency   ? Chest pain 04/13/2019  ? History of recent transcatheter aortic valve replacement  (TAVR) 04/13/2019  ? Cholecystostomy care [facility]) 04/13/2019  ? 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  ? COPD (chronic obstructive pulmonary disease) ([place])   ? Coronary artery disease   ? Chronic ischemic heart disease

## 2021-06-27 ENCOUNTER — Telehealth: Payer: Self-pay | Admitting: Family Medicine

## 2021-06-27 NOTE — Telephone Encounter (Signed)
Does he ntbs sooner than 4/24? ?

## 2021-06-27 NOTE — Telephone Encounter (Signed)
Ok to wait. It's a DM follow up ?

## 2021-06-28 ENCOUNTER — Encounter: Payer: Medicare Other | Admitting: Physical Therapy

## 2021-06-28 NOTE — Telephone Encounter (Signed)
Patients wife aware

## 2021-06-29 ENCOUNTER — Ambulatory Visit: Payer: Medicare Other | Admitting: Family Medicine

## 2021-06-29 DIAGNOSIS — Z952 Presence of prosthetic heart valve: Secondary | ICD-10-CM | POA: Diagnosis not present

## 2021-06-29 DIAGNOSIS — I371 Nonrheumatic pulmonary valve insufficiency: Secondary | ICD-10-CM | POA: Diagnosis not present

## 2021-06-29 DIAGNOSIS — I5031 Acute diastolic (congestive) heart failure: Secondary | ICD-10-CM | POA: Diagnosis not present

## 2021-06-29 DIAGNOSIS — I071 Rheumatic tricuspid insufficiency: Secondary | ICD-10-CM | POA: Diagnosis not present

## 2021-06-29 DIAGNOSIS — I340 Nonrheumatic mitral (valve) insufficiency: Secondary | ICD-10-CM | POA: Diagnosis not present

## 2021-07-01 DIAGNOSIS — J449 Chronic obstructive pulmonary disease, unspecified: Secondary | ICD-10-CM | POA: Diagnosis not present

## 2021-07-03 ENCOUNTER — Telehealth: Payer: Medicare Other

## 2021-07-06 ENCOUNTER — Other Ambulatory Visit: Payer: Self-pay | Admitting: Family Medicine

## 2021-07-06 DIAGNOSIS — M545 Low back pain, unspecified: Secondary | ICD-10-CM

## 2021-07-13 ENCOUNTER — Other Ambulatory Visit: Payer: Self-pay | Admitting: Family Medicine

## 2021-07-13 DIAGNOSIS — J449 Chronic obstructive pulmonary disease, unspecified: Secondary | ICD-10-CM

## 2021-07-13 DIAGNOSIS — G309 Alzheimer's disease, unspecified: Secondary | ICD-10-CM | POA: Diagnosis not present

## 2021-07-13 DIAGNOSIS — Z85828 Personal history of other malignant neoplasm of skin: Secondary | ICD-10-CM | POA: Diagnosis not present

## 2021-07-13 DIAGNOSIS — Z7901 Long term (current) use of anticoagulants: Secondary | ICD-10-CM | POA: Diagnosis not present

## 2021-07-13 DIAGNOSIS — Z8249 Family history of ischemic heart disease and other diseases of the circulatory system: Secondary | ICD-10-CM | POA: Diagnosis not present

## 2021-07-13 DIAGNOSIS — I2510 Atherosclerotic heart disease of native coronary artery without angina pectoris: Secondary | ICD-10-CM | POA: Diagnosis not present

## 2021-07-13 DIAGNOSIS — K5909 Other constipation: Secondary | ICD-10-CM

## 2021-07-13 DIAGNOSIS — D414 Neoplasm of uncertain behavior of bladder: Secondary | ICD-10-CM | POA: Diagnosis not present

## 2021-07-13 DIAGNOSIS — Z955 Presence of coronary angioplasty implant and graft: Secondary | ICD-10-CM | POA: Diagnosis not present

## 2021-07-13 DIAGNOSIS — R001 Bradycardia, unspecified: Secondary | ICD-10-CM | POA: Diagnosis not present

## 2021-07-13 DIAGNOSIS — R531 Weakness: Secondary | ICD-10-CM | POA: Diagnosis not present

## 2021-07-13 DIAGNOSIS — I5033 Acute on chronic diastolic (congestive) heart failure: Secondary | ICD-10-CM

## 2021-07-13 DIAGNOSIS — I5020 Unspecified systolic (congestive) heart failure: Secondary | ICD-10-CM | POA: Diagnosis not present

## 2021-07-13 DIAGNOSIS — Z789 Other specified health status: Secondary | ICD-10-CM | POA: Diagnosis not present

## 2021-07-13 DIAGNOSIS — I428 Other cardiomyopathies: Secondary | ICD-10-CM | POA: Diagnosis not present

## 2021-07-13 DIAGNOSIS — I5023 Acute on chronic systolic (congestive) heart failure: Secondary | ICD-10-CM | POA: Diagnosis not present

## 2021-07-13 DIAGNOSIS — Z45018 Encounter for adjustment and management of other part of cardiac pacemaker: Secondary | ICD-10-CM | POA: Diagnosis not present

## 2021-07-13 DIAGNOSIS — Z952 Presence of prosthetic heart valve: Secondary | ICD-10-CM | POA: Diagnosis not present

## 2021-07-13 DIAGNOSIS — I110 Hypertensive heart disease with heart failure: Secondary | ICD-10-CM | POA: Diagnosis not present

## 2021-07-13 DIAGNOSIS — I252 Old myocardial infarction: Secondary | ICD-10-CM | POA: Diagnosis not present

## 2021-07-13 DIAGNOSIS — D61818 Other pancytopenia: Secondary | ICD-10-CM | POA: Diagnosis not present

## 2021-07-13 DIAGNOSIS — E039 Hypothyroidism, unspecified: Secondary | ICD-10-CM | POA: Diagnosis not present

## 2021-07-13 DIAGNOSIS — Z7409 Other reduced mobility: Secondary | ICD-10-CM | POA: Diagnosis not present

## 2021-07-13 DIAGNOSIS — I4891 Unspecified atrial fibrillation: Secondary | ICD-10-CM | POA: Diagnosis not present

## 2021-07-13 DIAGNOSIS — R0602 Shortness of breath: Secondary | ICD-10-CM | POA: Diagnosis not present

## 2021-07-13 DIAGNOSIS — I5021 Acute systolic (congestive) heart failure: Secondary | ICD-10-CM | POA: Diagnosis not present

## 2021-07-13 DIAGNOSIS — Z87891 Personal history of nicotine dependence: Secondary | ICD-10-CM | POA: Diagnosis not present

## 2021-07-13 DIAGNOSIS — E119 Type 2 diabetes mellitus without complications: Secondary | ICD-10-CM | POA: Diagnosis not present

## 2021-07-13 DIAGNOSIS — I482 Chronic atrial fibrillation, unspecified: Secondary | ICD-10-CM | POA: Diagnosis not present

## 2021-07-13 DIAGNOSIS — G4733 Obstructive sleep apnea (adult) (pediatric): Secondary | ICD-10-CM | POA: Diagnosis not present

## 2021-07-13 DIAGNOSIS — I493 Ventricular premature depolarization: Secondary | ICD-10-CM | POA: Diagnosis not present

## 2021-07-13 DIAGNOSIS — Z800 Family history of malignant neoplasm of digestive organs: Secondary | ICD-10-CM | POA: Diagnosis not present

## 2021-07-13 DIAGNOSIS — I5031 Acute diastolic (congestive) heart failure: Secondary | ICD-10-CM | POA: Diagnosis not present

## 2021-07-13 DIAGNOSIS — I5032 Chronic diastolic (congestive) heart failure: Secondary | ICD-10-CM | POA: Diagnosis not present

## 2021-07-13 DIAGNOSIS — K219 Gastro-esophageal reflux disease without esophagitis: Secondary | ICD-10-CM | POA: Diagnosis not present

## 2021-07-13 DIAGNOSIS — I259 Chronic ischemic heart disease, unspecified: Secondary | ICD-10-CM | POA: Diagnosis not present

## 2021-07-13 DIAGNOSIS — I350 Nonrheumatic aortic (valve) stenosis: Secondary | ICD-10-CM | POA: Diagnosis not present

## 2021-07-13 DIAGNOSIS — I5043 Acute on chronic combined systolic (congestive) and diastolic (congestive) heart failure: Secondary | ICD-10-CM | POA: Diagnosis not present

## 2021-07-16 ENCOUNTER — Telehealth (INDEPENDENT_AMBULATORY_CARE_PROVIDER_SITE_OTHER): Payer: Medicare Other | Admitting: Family Medicine

## 2021-07-16 ENCOUNTER — Encounter: Payer: Self-pay | Admitting: Family Medicine

## 2021-07-16 DIAGNOSIS — Z7409 Other reduced mobility: Secondary | ICD-10-CM

## 2021-07-16 DIAGNOSIS — I5043 Acute on chronic combined systolic (congestive) and diastolic (congestive) heart failure: Secondary | ICD-10-CM | POA: Diagnosis not present

## 2021-07-16 DIAGNOSIS — R0602 Shortness of breath: Secondary | ICD-10-CM

## 2021-07-16 DIAGNOSIS — G309 Alzheimer's disease, unspecified: Secondary | ICD-10-CM

## 2021-07-16 DIAGNOSIS — R531 Weakness: Secondary | ICD-10-CM

## 2021-07-16 DIAGNOSIS — Z789 Other specified health status: Secondary | ICD-10-CM

## 2021-07-16 DIAGNOSIS — F0280 Dementia in other diseases classified elsewhere without behavioral disturbance: Secondary | ICD-10-CM

## 2021-07-16 NOTE — Progress Notes (Signed)
? ?Virtual Visit via video Note ? ? ?Due to COVID-19 pandemic this visit was conducted virtually. This visit type was conducted due to national recommendations for restrictions regarding the COVID-19 Pandemic (e.g. social distancing, sheltering in place) in an effort to limit this patient's exposure and mitigate transmission in our community. All issues noted in this document were discussed and addressed.  A physical exam was not performed with this format. ? ?I connected with  Matthew Campos  on 07/16/21 at 0912 by video and verified that I am speaking with the correct person using two identifiers. Matthew Campos is currently located in the hospital and  is currently with [name] during visit. The provider, Gwenlyn Perking, FNP is located in their office at time of visit. ? ?I discussed the limitations, risks, security and privacy concerns of performing an evaluation and management service by video  and the availability of in person appointments. I also discussed with the patient that there may be a patient responsible charge related to this service. The patient expressed understanding and agreed to proceed. ? ?CC: referral ? ?History and Present Illness: ?Matthew Campos is currently admitted at [facility] for acute on chronic HF. History is primarily provided by [name]. [name] is stable. An MRI is planned for tomorrow. The cardiac team has stated that Matthew with need [facility] for PT/OT at discharge. Matthew Campos would like to go ahead and get the ball rolling for this. He will also need an aide. He has dementia, CHF, shortness of breath, mobility and balance issues, and generalized weakness. He is on oxygen at home. He needs help with ADLs such as bathing himself, meal prepping and feeding, and dressing himself. He lives at home with his wife who is also elderly. The cardiac team also stated that he will need PT to improve his strength and endurance. He has had difficulty with balance and with walking as well. Just prior to  this admission, they were working to get him a motorized scooter to help with this. His discharge date is TBD. ? ? ?ROS ?As per HPI.  ? ? ? ?Observations/Objective: ?Alert and oriented. Respirations unlabored. No cyanosis noted. Nasal cannula in place.  ? ?Assessment and Plan: ?[name] was seen today for referral. ? ?Diagnoses and all orders for this visit: ? ?Acute on chronic combined systolic and diastolic congestive heart failure ([place]) ?-     Ambulatory referral to Home Health ? ?Shortness of breath ?-     Ambulatory referral to Home Health ? ?Alzheimer's dementia without behavioral disturbance ([name]) ?-     Ambulatory referral to Home Health ? ?Generalized weakness ?-     Ambulatory referral to Home Health ? ?Impaired mobility and ADLs ?-     Ambulatory referral to Home Health ? ?Referral for [name] for PT and OT placed today for above diagnoses.  ?Also requested aide. Will speak with [name] regarding options for aide in the home.  ? ?Follow Up Instructions: ?Pending hospital d/c  ? ?  ?I discussed the assessment and treatment plan with the patient. The patient was provided an opportunity to ask questions and all were answered. The patient agreed with the plan and demonstrated an understanding of the instructions. ?  ?The patient was advised to call back or seek an in-person evaluation if the symptoms worsen or if the condition fails to improve as anticipated. ? ?The above assessment and management plan was discussed with the patient. The patient verbalized understanding of and has agreed to the  management plan. Patient is aware to call the clinic if symptoms persist or worsen. Patient is aware when to return to the clinic for a follow-up visit. Patient educated on when it is appropriate to go to the emergency department.  ? ?Time video call ended: 0924 ? ?I provided 9 minutes of face-to-face time during this encounter. ? ? ? ?Gwenlyn Perking, FNP ? ? ?

## 2021-07-19 ENCOUNTER — Telehealth: Payer: Self-pay | Admitting: Family Medicine

## 2021-07-19 NOTE — Telephone Encounter (Signed)
Patient is getting discharged from [facility] today, granddaughter calling because when he had an appt with Tiffany on 4/3 she said she needed to know when he would be discharged.  ?

## 2021-07-26 ENCOUNTER — Ambulatory Visit (INDEPENDENT_AMBULATORY_CARE_PROVIDER_SITE_OTHER): Payer: Medicare Other | Admitting: Family Medicine

## 2021-07-26 ENCOUNTER — Encounter: Payer: Self-pay | Admitting: Family Medicine

## 2021-07-26 VITALS — BP 110/60 | HR 77 | Temp 97.6°F | Ht 68.0 in | Wt 195.0 lb

## 2021-07-26 DIAGNOSIS — R296 Repeated falls: Secondary | ICD-10-CM | POA: Diagnosis not present

## 2021-07-26 DIAGNOSIS — Z09 Encounter for follow-up examination after completed treatment for conditions other than malignant neoplasm: Secondary | ICD-10-CM | POA: Diagnosis not present

## 2021-07-26 DIAGNOSIS — I5033 Acute on chronic diastolic (congestive) heart failure: Secondary | ICD-10-CM

## 2021-07-26 NOTE — Patient Instructions (Addendum)
Matthew Campos , ?Thank you for taking time to come for your Medicare Wellness Visit. I appreciate your ongoing commitment to your health goals. Please review the following plan we discussed and let me know if I can assist you in the future.  ? ?Screening recommendations/referrals: ?Colonoscopy: No longer required ?Recommended yearly ophthalmology/optometry visit for glaucoma screening and checkup ?Recommended yearly dental visit for hygiene and checkup ? ?Vaccinations: ?Influenza vaccine: Done 01/15/2021 - Repeat annually ?Pneumococcal vaccine: Done 11/25/2014, 05/30/2016, 01/01/2019, & 01/15/2021 ?Tdap vaccine: Done 11/25/2014 - Repeat in 10 years ?Shingles vaccine: Due* - Shingrix is 2 doses 2-6 months apart and over 90% effective     ?Covid-19: Done 07/05/2019, 08/02/2019, & 02/23/2020 - for boosters, contact pharmacy ? ?Advanced directives: Advance directive discussed with you today. Even though you declined this today, please call our office should you change your mind, and we can give you the proper paperwork for you to fill out.  ? ?Conditions/risks identified: Aim for 30 minutes of exercise or walking, 6-8 glasses of water, and 5 servings of fruits and vegetables each day.  ? ?Next appointment: Follow up in one year for your annual wellness visit.  ? ?Preventive Care 80 Years and Older, Male ? ?Preventive care refers to lifestyle choices and visits with your health care provider that can promote health and wellness. ?What does preventive care include? ?A yearly physical exam. This is also called an annual well check. ?Dental exams once or twice a year. ?Routine eye exams. Ask your health care provider how often you should have your eyes checked. ?Personal lifestyle choices, including: ?Daily care of your teeth and gums. ?Regular physical activity. ?Eating a healthy diet. ?Avoiding tobacco and drug use. ?Limiting alcohol use. ?Practicing safe sex. ?Taking low doses of aspirin every day. ?Taking vitamin and mineral  supplements as recommended by your health care provider. ?What happens during an annual well check? ?The services and screenings done by your health care provider during your annual well check will depend on your age, overall health, lifestyle risk factors, and family history of disease. ?Counseling  ?Your health care provider may ask you questions about your: ?Alcohol use. ?Tobacco use. ?Drug use. ?Emotional well-being. ?Home and relationship well-being. ?Sexual activity. ?Eating habits. ?History of falls. ?Memory and ability to understand (cognition). ?Work and work Statistician. ?Screening  ?You may have the following tests or measurements: ?Height, weight, and BMI. ?Blood pressure. ?Lipid and cholesterol levels. These may be checked every 5 years, or more frequently if you are over 74 years old. ?Skin check. ?Lung cancer screening. You may have this screening every year starting at age 1 if you have a 30-pack-year history of smoking and currently smoke or have quit within the past 15 years. ?Fecal occult blood test (FOBT) of the stool. You may have this test every year starting at age 35. ?Flexible sigmoidoscopy or colonoscopy. You may have a sigmoidoscopy every 5 years or a colonoscopy every 10 years starting at age 71. ?Prostate cancer screening. Recommendations will vary depending on your family history and other risks. ?Hepatitis C blood test. ?Hepatitis B blood test. ?Sexually transmitted disease (STD) testing. ?Diabetes screening. This is done by checking your blood sugar (glucose) after you have not eaten for a while (fasting). You may have this done every 1-3 years. ?Abdominal aortic aneurysm (AAA) screening. You may need this if you are a current or former smoker. ?Osteoporosis. You may be screened starting at age 4 if you are at high risk. ?Talk with your health  care provider about your test results, treatment options, and if necessary, the need for more tests. ?Vaccines  ?Your health care provider  may recommend certain vaccines, such as: ?Influenza vaccine. This is recommended every year. ?Tetanus, diphtheria, and acellular pertussis (Tdap, Td) vaccine. You may need a Td booster every 10 years. ?Zoster vaccine. You may need this after age 41. ?Pneumococcal 13-valent conjugate (PCV13) vaccine. One dose is recommended after age 53. ?Pneumococcal polysaccharide (PPSV23) vaccine. One dose is recommended after age 33. ?Talk to your health care provider about which screenings and vaccines you need and how often you need them. ?This information is not intended to replace advice given to you by your health care provider. Make sure you discuss any questions you have with your health care provider. ?Document Released: 04/28/2015 Document Revised: 12/20/2015 Document Reviewed: 01/31/2015 ?Elsevier Interactive Patient Education ? 2017 [place]. ? ?Fall Prevention in the Home ?Falls can cause injuries. They can happen to people of all ages. There are many things you can do to make your home safe and to help prevent falls. ?What can I do on the outside of my home? ?Regularly fix the edges of walkways and driveways and fix any cracks. ?Remove anything that might make you trip as you walk through a door, such as a raised step or threshold. ?Trim any bushes or trees on the path to your home. ?Use bright outdoor lighting. ?Clear any walking paths of anything that might make someone trip, such as rocks or tools. ?Regularly check to see if handrails are loose or broken. Make sure that both sides of any steps have handrails. ?Any raised decks and porches should have guardrails on the edges. ?Have any leaves, snow, or ice cleared regularly. ?Use sand or salt on walking paths during winter. ?Clean up any spills in your garage right away. This includes oil or grease spills. ?What can I do in the bathroom? ?Use night lights. ?Install grab bars by the toilet and in the tub and shower. Do not use towel bars as grab bars. ?Use  non-skid mats or decals in the tub or shower. ?If you need to sit down in the shower, use a plastic, non-slip stool. ?Keep the floor dry. Clean up any water that spills on the floor as soon as it happens. ?Remove soap buildup in the tub or shower regularly. ?Attach bath mats securely with double-sided non-slip rug tape. ?Do not have throw rugs and other things on the floor that can make you trip. ?What can I do in the bedroom? ?Use night lights. ?Make sure that you have a light by your bed that is easy to reach. ?Do not use any sheets or blankets that are too big for your bed. They should not hang down onto the floor. ?Have a firm chair that has side arms. You can use this for support while you get dressed. ?Do not have throw rugs and other things on the floor that can make you trip. ?What can I do in the kitchen? ?Clean up any spills right away. ?Avoid walking on wet floors. ?Keep items that you use a lot in easy-to-reach places. ?If you need to reach something above you, use a strong step stool that has a grab bar. ?Keep electrical cords out of the way. ?Do not use floor polish or wax that makes floors slippery. If you must use wax, use non-skid floor wax. ?Do not have throw rugs and other things on the floor that can make you trip. ?What can  I do with my stairs? ?Do not leave any items on the stairs. ?Make sure that there are handrails on both sides of the stairs and use them. Fix handrails that are broken or loose. Make sure that handrails are as long as the stairways. ?Check any carpeting to make sure that it is firmly attached to the stairs. Fix any carpet that is loose or worn. ?Avoid having throw rugs at the top or bottom of the stairs. If you do have throw rugs, attach them to the floor with carpet tape. ?Make sure that you have a light switch at the top of the stairs and the bottom of the stairs. If you do not have them, ask someone to add them for you. ?What else can I do to help prevent falls? ?Wear  shoes that: ?Do not have high heels. ?Have rubber bottoms. ?Are comfortable and fit you well. ?Are closed at the toe. Do not wear sandals. ?If you use a stepladder: ?Make sure that it is fully opened. Do not climb

## 2021-07-26 NOTE — Progress Notes (Signed)
? ?Assessment & Plan:  ?1. 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 Encouraged to keep follow-up with cardiology next month. May increase torsemide after labs result if kidney function is back to baseline.  ?- BMP8+EGFR ? ?2. Hospital discharge follow-up ? ?3. Multiple falls ?Patient is searching for in-home help. He does not qualify for the CAPS program since he does not have Medicaid. ? ? ?Return as scheduled. ? ?Hendricks Limes, MSN, APRN, FNP-C ?[place] ? ?Subjective:  ? ? Patient ID: Matthew Campos, male    DOB: 07-20-1940, 81 y.o.   MRN: [id number] ? ?Patient Care Team: ?[name], DO as PCP - General (Family Medicine) ?Curly Rim, MD as Referring Physician (Internal Medicine) ?[name], DO (Family Medicine) ?Katha Cabal, LCSW as Education officer, museum (Licensed Holiday representative) ?Lavera Guise, [facility] (Pharmacist)  ? ?Chief Complaint:  ?Chief Complaint  ?Patient presents with  ? Hospitalization Follow-up  ?  [facility] - Acute heart failure   ? ? ?HPI: ?Matthew Campos is a 81 y.o. male presenting on 07/26/2021 for Hospitalization Follow-up ([facility] - Acute heart failure ) ? ?Patient is accompanied by his wife, who he is okay with being present. ? ?Patient was hospitalized at [facility] 07/13/2021-07/20/2021 due to acute heart failure. He was diuresed and had a TTE performed which showed an EF of 35-40% with hypokinesis of inferior and septal segments. He underwent PCI on 07/18/2021 which showed moderate non-obstructive CAD and patent stents. On discharge torsemide was decreased to 20 mg every other day from 40 mg twice daily due to AKI, which may be increased back once kidney function is back at baseline. Plavix was discontinued due to non-obstructive CAD. He has a follow-up with cardiology scheduled for 08/30/2021.  ? ?Patient does weight himself daily at home and only fluctuates by 1-2 pounds. He was 195 lbs at  discharge per his wife and was 195 lbs again today.  ? ?New complaints: ?Patient reports having multiple falls. His wife reports he has been gradually going downhill since 2020. He has done physical therapy multiple times per wife and they both agree it is not helpful. He reports when he falls it is for no reason. Denies dizziness or feeling like his knee gave out, states he just falls all of a sudden.  ? ? ?Social history: ? ?Relevant past medical, surgical, family and social history reviewed and updated as indicated. Interim medical history since our last visit reviewed. ? ?Allergies and medications reviewed and updated. ? ?DATA REVIEWED: CHART IN EPIC ? ?ROS: Negative unless specifically indicated above in HPI.  ? ? ?Current Outpatient Medications:  ?  albuterol (PROVENTIL) (2.5 MG/3ML) 0.083% nebulizer solution, Take 2.5 mg by nebulization every 6 (six) hours as needed for wheezing or shortness of breath., Disp: , Rfl:  ?  albuterol (VENTOLIN HFA) 108 (90 Base) MCG/ACT inhaler, INHALE 2 PUFFS INTO THE LUNGS EVERY 6 HOURS AS NEEDED FOR WHEEZING OR SHORTNESS OF BREATH, Disp: 8.5 g, Rfl: 1 ?  aspirin EC 81 MG tablet, Take 81 mg by mouth daily., Disp: , Rfl:  ?  azelastine (ASTELIN) 0.1 % nasal spray, Place 1 spray into both nostrils 2 (two) times daily. For runny nose, Disp: 30 mL, Rfl: 12 ?  Blood Glucose Monitoring Suppl (BLOOD GLUCOSE MONITOR SYSTEM) w/Device KIT, 1 Device by Does not apply route 2 (two) times daily. Dx E11.9, Disp: 1 kit, Rfl: 0 ?  calcium carbonate (OSCAL)  1500 (600 Ca) MG TABS tablet, Take by mouth 2 (two) times daily with a meal., Disp: , Rfl:  ?  clopidogrel (PLAVIX) 75 MG tablet, TAKE ONE (1) TABLET EACH DAY, Disp: 90 tablet, Rfl: 0 ?  clotrimazole-betamethasone (LOTRISONE) cream, Apply 1 application topically 2 (two) times daily. To torso For up to 14 days, Disp: 45 g, Rfl: 1 ?  dapagliflozin propanediol (FARXIGA) 10 MG TABS tablet, TAKE ONE TABLET EACH MORNING BEFORE BREAKFAST, Disp: 30  tablet, Rfl: 12 ?  dextromethorphan-guaiFENesin (MUCINEX DM) 30-600 MG 12hr tablet, Take 1 tablet by mouth 2 (two) times daily., Disp: 30 tablet, Rfl: 0 ?  donepezil (ARICEPT) 5 MG tablet, Take 5 mg (1 pill) daily for 4 weeks, then increase to 10 mg (2 pills) daily, Disp: 60 tablet, Rfl: 2 ?  ferrous sulfate 325 (65 FE) MG EC tablet, Take 325 mg by mouth every Monday, Wednesday, and Friday. , Disp: , Rfl:  ?  glipiZIDE (GLUCOTROL) 5 MG tablet, Take 1 tablet (5 mg total) by mouth 2 (two) times daily before a meal., Disp: 180 tablet, Rfl: 3 ?  glucose blood (COOL BLOOD GLUCOSE TEST STRIPS) test strip, Use to check BS BID dx E11.9, Disp: 100 each, Rfl: 3 ?  Lancet Device MISC, Use to check BS BID Dx E11.9, Disp: 100 each, Rfl: 3 ?  levothyroxine (SYNTHROID) 100 MCG tablet, TAKE ONE (1) TABLET EACH DAY, Disp: 90 tablet, Rfl: 3 ?  lidocaine (LIDODERM) 5 %, APPLY 1 PATCH AND LEAVE ON FOR 12 HOURS THEN OFF FOR 12 HOURS, Disp: 30 patch, Rfl: 12 ?  loperamide (IMODIUM) 2 MG capsule, TAKE 2 CAPSULES BY MOUTH AS NEEDED FOR DIARRHEA/LOOSE STOOL. FOLLOWED BY 1 CAPSULE AFTER EACH LOOSE STOOL (MAX OF 8 CAPSULES DAILY), Disp: 30 capsule, Rfl: 1 ?  lubiprostone (AMITIZA) 24 MCG capsule, TAKE 1 CAPSULE TWICE A DAY WITH MEALS., Disp: 60 capsule, Rfl: 2 ?  methocarbamol (ROBAXIN) 500 MG tablet, TAKE 1/2 TO 1 TABLET BY MOUTH TWICE DAILY AS NEEDED FOR MUSCLE SPASMS, Disp: 30 tablet, Rfl: 0 ?  mirtazapine (REMERON) 15 MG tablet, Take 1 tablet (15 mg total) by mouth at bedtime., Disp: 90 tablet, Rfl: 1 ?  Multiple Vitamin (MULTIVITAMIN WITH MINERALS) TABS, Take 1 tablet by mouth daily., Disp: , Rfl:  ?  nitroGLYCERIN (NITROSTAT) 0.4 MG SL tablet, DISSOLVE 1 TAB UNDER TOUNGE FOR CHEST PAIN. MAY REPEAT EVERY 5 MINUTES FOR 3 DOSES. IF NO RELIEF CALL 911 OR GO TO ER, Disp: 25 tablet, Rfl: 2 ?  ondansetron (ZOFRAN-ODT) 4 MG disintegrating tablet, TAKE 1 TABLET BY MOUTH EVERY 8 HOURS AS NEEDED FOR NAUSEA & VOMITING, Disp: 30 tablet, Rfl: 0 ?   pantoprazole (PROTONIX) 40 MG tablet, TAKE ONE (1) TABLET EACH DAY, Disp: 90 tablet, Rfl: 0 ?  rosuvastatin (CRESTOR) 10 MG tablet, TAKE ONE (1) TABLET EACH DAY, Disp: 90 tablet, Rfl: 3 ?  Semaglutide (RYBELSUS) 7 MG TABS, Take 7 mg by mouth daily., Disp: 30 tablet, Rfl: 12 ?  sodium chloride (OCEAN) 0.65 % nasal spray, Place into the nose., Disp: , Rfl:  ?  SYMBICORT 80-4.5 MCG/ACT inhaler, Inhale 2 puffs into the lungs 2 (two) times daily as needed., Disp: , Rfl:  ?  torsemide (DEMADEX) 20 MG tablet, TAKE TWO TABLETS BY MOUTH TWICE DAILY, Disp: 120 tablet, Rfl: 2 ?  traZODone (DESYREL) 50 MG tablet, TAKE 1/2 TO 1 TABLET AT BEDTIME AS NEEDED FOR SLEEP, Disp: 90 tablet, Rfl: 1 ?  acetaZOLAMIDE (DIAMOX) 250 MG  tablet, Take by mouth., Disp: , Rfl:  ?  spironolactone (ALDACTONE) 25 MG tablet, Take 25 mg by mouth daily. , Disp: , Rfl:   ? ?Allergies  ?Allergen Reactions  ? Codeine Nausea And Vomiting  ? Latex Hives and Itching  ? Morphine   ? Lisinopril Cough  ? ?Past Medical History:  ?Diagnosis Date  ? AAA (abdominal aortic aneurysm) without rupture ([place])   ? repaired  ? Abdominal aortic aneurysm without rupture ([place])   ? Anemia   ? Anxiety and depression   ? Aortic stenosis   ? 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[place])   ? CHF (congestive heart failure) ([place])   ? Cholecystitis   ? Chronic ischemic heart disease   ? Complication of anesthesia   ? hard to be put to sleep  ? COPD (chronic obstructive pulmonary disease) ([place])   ? Coronary artery disease   ? Diabetes mellitus without complication ([place])   ? Diabetes mellitus without complication ([place])   ? takes Januvia and Metformin daily  ? GERD (gastroesophageal reflux disease)   ? GERD (gastroesophageal reflux disease)   ? takes omeprazole daily  ? Headache(784.0)   ? sinus  ? History of bronchitis   ? last time >66yrs ago  ? Hyperlipidemia   ? takes Lipitor daily  ? Hypertension   ? Hypertension   ? takes Metoprolol daily  ? Infected sebaceous cyst 01/30/2021  ? Joint pain   ?  Myocardial infarction ([place])   ? x 3;last one about 3-66yrs ago  ? Neoplasm of uncertain behavior of bladder   ? Obesity   ? OSA (obstructive sleep apnea)   ? Pacemaker   ? Pancytopenia ([place])   ? Paroxysmal atria

## 2021-07-27 ENCOUNTER — Ambulatory Visit (INDEPENDENT_AMBULATORY_CARE_PROVIDER_SITE_OTHER): Payer: Medicare Other

## 2021-07-27 VITALS — Wt 195.0 lb

## 2021-07-27 DIAGNOSIS — Z0000 Encounter for general adult medical examination without abnormal findings: Secondary | ICD-10-CM

## 2021-07-27 LAB — BMP8+EGFR
BUN/Creatinine Ratio: 11 (ref 10–24)
BUN: 19 mg/dL (ref 8–27)
CO2: 23 mmol/L (ref 20–29)
Calcium: 9.4 mg/dL (ref 8.6–10.2)
Chloride: 103 mmol/L (ref 96–106)
Creatinine, Ser: 1.66 mg/dL — ABNORMAL HIGH (ref 0.76–1.27)
Glucose: 107 mg/dL — ABNORMAL HIGH (ref 70–99)
Potassium: 4.8 mmol/L (ref 3.5–5.2)
Sodium: 145 mmol/L — ABNORMAL HIGH (ref 134–144)
eGFR: 41 mL/min/{1.73_m2} — ABNORMAL LOW (ref >59)

## 2021-07-27 NOTE — Progress Notes (Signed)
? ?Subjective:  ? Matthew Campos is a 81 y.o. male who presents for Medicare Annual/Subsequent preventive examination. ? ?Virtual Visit via Telephone Note ? ?I connected with  Matthew Campos on 07/27/21 at  9:45 AM EDT by telephone and verified that I am speaking with the correct person using two identifiers. ? ?Location: ?Patient: Home ?Provider: [facility] ?Persons participating in the virtual visit: patient/Nurse Health Advisor ?  ?I discussed the limitations, risks, security and privacy concerns of performing an evaluation and management service by telephone and the availability of in person appointments. The patient expressed understanding and agreed to proceed. ? ?Interactive audio and video telecommunications were attempted between this nurse and patient, however failed, due to patient having technical difficulties OR patient did not have access to video capability.  We continued and completed visit with audio only. ? ?Some vital signs may be absent or patient reported.  ? ?Alenna Russell Dionne Ano, LPN  ? ?Review of Systems    ? ?Cardiac Risk Factors include: advanced age (>107men, >66 women);diabetes mellitus;dyslipidemia;hypertension;male gender;sedentary lifestyle;Other (see comment), Risk factor comments: A.Fib, CHF, CAD, aortic stenosis, COPD, OSA on CPAP, oxygen dependent, hx TAVR ? ?   ?Objective:  ?  ?Today's Vitals  ? 07/27/21 1151  ?Weight: 195 lb (88.5 kg)  ? ?Body mass index is 29.65 kg/m?. ? ? ?  07/27/2021  ? 11:59 AM 06/21/2021  ?  3:07 PM 03/23/2021  ? 11:03 AM 07/26/2020  ? 11:23 AM 09/07/2019  ? 10:10 AM 07/26/2019  ? 11:00 AM 04/17/2019  ? 11:07 PM  ?Advanced Directives  ?Does Patient Have a Medical Advance Directive? No Yes No No No Yes No  ?Type of Advance Directive      Living will   ?Does patient want to make changes to medical advance directive?      No - Guardian declined No - Patient declined  ?Would patient like information on creating a medical advance directive? No - Patient declined  No - Patient  declined Yes (MAU/Ambulatory/Procedural Areas - Information given) No - Patient declined  No - Patient declined  ? ? ?Current Medications (verified) ?Outpatient Encounter Medications as of 07/27/2021  ?Medication Sig  ? albuterol (PROVENTIL) (2.5 MG/3ML) 0.083% nebulizer solution Take 2.5 mg by nebulization every 6 (six) hours as needed for wheezing or shortness of breath.  ? albuterol (VENTOLIN HFA) 108 (90 Base) MCG/ACT inhaler INHALE 2 PUFFS INTO THE LUNGS EVERY 6 HOURS AS NEEDED FOR WHEEZING OR SHORTNESS OF BREATH  ? aspirin EC 81 MG tablet Take 81 mg by mouth daily.  ? azelastine (ASTELIN) 0.1 % nasal spray Place 1 spray into both nostrils 2 (two) times daily. For runny nose  ? Blood Glucose Monitoring Suppl (BLOOD GLUCOSE MONITOR SYSTEM) w/Device KIT 1 Device by Does not apply route 2 (two) times daily. Dx E11.9  ? calcium carbonate (OSCAL) 1500 (600 Ca) MG TABS tablet Take by mouth 2 (two) times daily with a meal.  ? clopidogrel (PLAVIX) 75 MG tablet TAKE ONE (1) TABLET EACH DAY  ? dapagliflozin propanediol (FARXIGA) 10 MG TABS tablet TAKE ONE TABLET EACH MORNING BEFORE BREAKFAST  ? donepezil (ARICEPT) 5 MG tablet Take 5 mg (1 pill) daily for 4 weeks, then increase to 10 mg (2 pills) daily  ? ferrous sulfate 325 (65 FE) MG EC tablet Take 325 mg by mouth every Monday, Wednesday, and Friday.   ? glipiZIDE (GLUCOTROL) 5 MG tablet Take 1 tablet (5 mg total) by mouth 2 (two) times daily before  a meal.  ? glucose blood (COOL BLOOD GLUCOSE TEST STRIPS) test strip Use to check BS BID dx E11.9  ? Lancet Device MISC Use to check BS BID Dx E11.9  ? levothyroxine (SYNTHROID) 100 MCG tablet TAKE ONE (1) TABLET EACH DAY  ? mirtazapine (REMERON) 15 MG tablet Take 1 tablet (15 mg total) by mouth at bedtime.  ? Multiple Vitamin (MULTIVITAMIN WITH MINERALS) TABS Take 1 tablet by mouth daily.  ? pantoprazole (PROTONIX) 40 MG tablet TAKE ONE (1) TABLET EACH DAY  ? rosuvastatin (CRESTOR) 10 MG tablet TAKE ONE (1) TABLET EACH DAY   ? Semaglutide (RYBELSUS) 7 MG TABS Take 7 mg by mouth daily.  ? sodium chloride (OCEAN) 0.65 % nasal spray Place into the nose.  ? SYMBICORT 80-4.5 MCG/ACT inhaler Inhale 2 puffs into the lungs 2 (two) times daily as needed.  ? torsemide (DEMADEX) 20 MG tablet TAKE TWO TABLETS BY MOUTH TWICE DAILY  ? traZODone (DESYREL) 50 MG tablet TAKE 1/2 TO 1 TABLET AT BEDTIME AS NEEDED FOR SLEEP  ? acetaZOLAMIDE (DIAMOX) 250 MG tablet Take by mouth.  ? dextromethorphan-guaiFENesin (MUCINEX DM) 30-600 MG 12hr tablet Take 1 tablet by mouth 2 (two) times daily. (Patient not taking: Reported on 07/27/2021)  ? lidocaine (LIDODERM) 5 % APPLY 1 PATCH AND LEAVE ON FOR 12 HOURS THEN OFF FOR 12 HOURS (Patient not taking: Reported on 07/27/2021)  ? loperamide (IMODIUM) 2 MG capsule TAKE 2 CAPSULES BY MOUTH AS NEEDED FOR DIARRHEA/LOOSE STOOL. FOLLOWED BY 1 CAPSULE AFTER EACH LOOSE STOOL (MAX OF 8 CAPSULES DAILY) (Patient not taking: Reported on 07/27/2021)  ? lubiprostone (AMITIZA) 24 MCG capsule TAKE 1 CAPSULE TWICE A DAY WITH MEALS.  ? methocarbamol (ROBAXIN) 500 MG tablet TAKE 1/2 TO 1 TABLET BY MOUTH TWICE DAILY AS NEEDED FOR MUSCLE SPASMS  ? nitroGLYCERIN (NITROSTAT) 0.4 MG SL tablet DISSOLVE 1 TAB UNDER TOUNGE FOR CHEST PAIN. MAY REPEAT EVERY 5 MINUTES FOR 3 DOSES. IF NO RELIEF CALL 911 OR GO TO ER  ? ondansetron (ZOFRAN-ODT) 4 MG disintegrating tablet TAKE 1 TABLET BY MOUTH EVERY 8 HOURS AS NEEDED FOR NAUSEA & VOMITING  ? spironolactone (ALDACTONE) 25 MG tablet Take 25 mg by mouth daily.   ? [DISCONTINUED] clotrimazole-betamethasone (LOTRISONE) cream Apply 1 application topically 2 (two) times daily. To torso For up to 14 days (Patient not taking: Reported on 07/27/2021)  ? ?No facility-administered encounter medications on file as of 07/27/2021.  ? ? ?Allergies (verified) ?Codeine, Latex, Morphine, and Lisinopril  ? ?History: ?Past Medical History:  ?Diagnosis Date  ? AAA (abdominal aortic aneurysm) without rupture ([place])   ? repaired   ? Abdominal aortic aneurysm without rupture ([place])   ? Anemia   ? Anxiety and depression   ? Aortic stenosis   ? 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[place])   ? CHF (congestive heart failure) ([place])   ? Cholecystitis   ? Chronic ischemic heart disease   ? Complication of anesthesia   ? hard to be put to sleep  ? COPD (chronic obstructive pulmonary disease) ([place])   ? Coronary artery disease   ? Diabetes mellitus without complication ([place])   ? Diabetes mellitus without complication ([place])   ? takes Januvia and Metformin daily  ? GERD (gastroesophageal reflux disease)   ? GERD (gastroesophageal reflux disease)   ? takes omeprazole daily  ? Headache(784.0)   ? sinus  ? History of bronchitis   ? last time >35yrs ago  ? Hyperlipidemia   ? takes Lipitor daily  ?  Hypertension   ? Hypertension   ? takes Metoprolol daily  ? Infected sebaceous cyst 01/30/2021  ? Joint pain   ? Myocardial infarction ([place])   ? x 3;last one about 3-81yrs ago  ? Neoplasm of uncertain behavior of bladder   ? Obesity   ? OSA (obstructive sleep apnea)   ? Pacemaker   ? Pancytopenia ([place])   ? Paroxysmal atrial fibrillation ([facility])   ? Pneumonia   ? last itme about 57yrs ago  ? S/P TAVR (transcatheter aortic valve replacement)   ? Severe aortic stenosis   ? Sinus bradycardia   ? ?Past Surgical History:  ?Procedure Laterality Date  ? abdominal aneurysm stenting    ? ABDOMINAL AORTIC ANEURYSM REPAIR    ? per patient stents placed about 4-5 years ago  ? AORTIC VALVE REPLACEMENT    ? stated it was done in November  ? cholecystostomy tube    ? CORONARY ANGIOPLASTY  2010  ? CORONARY ANGIOPLASTY WITH STENT PLACEMENT    ? about 30 years ago per patient  ? cyst removed from left wrist    ? ESOPHAGOGASTRODUODENOSCOPY (EGD) WITH PROPOFOL N/A 04/15/2019  ? Procedure: ESOPHAGOGASTRODUODENOSCOPY (EGD) WITH PROPOFOL;  Surgeon: Danie Binder, MD;  [facility];  Service: Endoscopy;  Laterality: N/A;  possible dilation  ? HERNIA REPAIR    ? left shoulder surgery    ?  POLYPECTOMY  04/15/2019  ? Procedure: POLYPECTOMY;  Surgeon: Danie Binder, MD;  [facility];  Service: Endoscopy;;  duodenal ?  ? SHOULDER ARTHROSCOPY WITH ROTATOR CUFF REPAIR AND SUBACROMIAL DECOMPRE

## 2021-07-30 ENCOUNTER — Telehealth: Payer: Medicare Other

## 2021-07-30 ENCOUNTER — Telehealth: Payer: Self-pay | Admitting: Family Medicine

## 2021-07-30 ENCOUNTER — Ambulatory Visit: Payer: Medicare Other | Admitting: Podiatry

## 2021-07-30 ENCOUNTER — Telehealth: Payer: Self-pay | Admitting: Licensed Clinical Social Worker

## 2021-07-30 NOTE — Telephone Encounter (Signed)
For CAPS/PCS, pt does not have MCD to qualify for this service, [name] explained that this was ordered by Karsten Fells the other week. There is a referral in for [facility] PT/OT & aide, [facility], [facility] on 07/26/21 [name] thinks that pt's wife declined this when they called. I explained that in order for the pt to get an aide he will have to have PT & OT as well, he will benefit from these to increase his strength & stabillity. She will have them reach back out to [facility], if they do not have the number they will contact us again. ?

## 2021-07-30 NOTE — Telephone Encounter (Addendum)
?  Chronic Care Management  ?  ? Clinical Social Work Note ?  ?07/30/21 ?Name: Matthew Campos       MRN: [id number]       DOB: Oct 05, 1940 ?  ?Matthew Campos is a 81 y.o. year old male who is a primary care patient of [name], DO. The CCM team was consulted to assist the patient with chronic disease management and/or care coordination needs related to: [employer] .  ?  ?Unsuccessful phone outreach by LCSW.  LCSW did leave phone message for client and spouse requesting return call to LCSW at [phone] ? ?Follow Up Plan:  LCSW to call client or spouse of client on 09/17/21 at 2:00 PM ? ?[name].Jaquelyn Sakamoto MSW, LCSW ?Licensed Clinical Social Worker ?[facility] ?[phone] ?

## 2021-08-01 DIAGNOSIS — J449 Chronic obstructive pulmonary disease, unspecified: Secondary | ICD-10-CM | POA: Diagnosis not present

## 2021-08-06 ENCOUNTER — Ambulatory Visit (INDEPENDENT_AMBULATORY_CARE_PROVIDER_SITE_OTHER): Payer: Medicare Other | Admitting: Family Medicine

## 2021-08-06 VITALS — BP 106/66 | HR 79 | Temp 98.2°F | Ht 68.0 in | Wt 202.0 lb

## 2021-08-06 DIAGNOSIS — I5032 Chronic diastolic (congestive) heart failure: Secondary | ICD-10-CM | POA: Diagnosis not present

## 2021-08-06 DIAGNOSIS — E1122 Type 2 diabetes mellitus with diabetic chronic kidney disease: Secondary | ICD-10-CM

## 2021-08-06 DIAGNOSIS — N1831 Chronic kidney disease, stage 3a: Secondary | ICD-10-CM

## 2021-08-06 DIAGNOSIS — F331 Major depressive disorder, recurrent, moderate: Secondary | ICD-10-CM | POA: Diagnosis not present

## 2021-08-06 LAB — GLUCOSE HEMOCUE WAIVED: Glu Hemocue Waived: 243 mg/dL — ABNORMAL HIGH (ref 70–99)

## 2021-08-06 NOTE — Progress Notes (Signed)
? ?Subjective: ?CC: Follow-up mood ?PCP: [name], DO ?Matthew Campos is a 81 y.o. male presenting to clinic today for: ? ?1.  Mood ?Patient is accompanied today by his wife who notes that his mood has been under good control with current medications.  He continues to have some difficulty with appetite but tells me today that he ate a [employer] tenderloin biscuit prior to arrival.  She reports that his weight has been essentially stable around the 190s at home.  He seems to be moving and breathing a lot better.  They are currently working towards getting Medicaid so that he can get some personal care services ? ? ?ROS: Per HPI ? ?Allergies  ?Allergen Reactions  ? Codeine Nausea And Vomiting  ? Latex Hives and Itching  ? Morphine   ? Lisinopril Cough  ? ?Past Medical History:  ?Diagnosis Date  ? AAA (abdominal aortic aneurysm) without rupture ([place])   ? repaired  ? Abdominal aortic aneurysm without rupture ([place])   ? Anemia   ? Anxiety and depression   ? Aortic stenosis   ? 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[place])   ? CHF (congestive heart failure) ([place])   ? Cholecystitis   ? Chronic ischemic heart disease   ? Complication of anesthesia   ? hard to be put to sleep  ? COPD (chronic obstructive pulmonary disease) ([place])   ? Coronary artery disease   ? Diabetes mellitus without complication ([place])   ? Diabetes mellitus without complication ([place])   ? takes Januvia and Metformin daily  ? GERD (gastroesophageal reflux disease)   ? GERD (gastroesophageal reflux disease)   ? takes omeprazole daily  ? Headache(784.0)   ? sinus  ? History of bronchitis   ? last time >86yrs ago  ? Hyperlipidemia   ? takes Lipitor daily  ? Hypertension   ? Hypertension   ? takes Metoprolol daily  ? Infected sebaceous cyst 01/30/2021  ? Joint pain   ? Myocardial infarction ([place])   ? x 3;last one about 3-70yrs ago  ? Neoplasm of uncertain behavior of bladder   ? Obesity   ? OSA (obstructive sleep apnea)   ? Pacemaker   ? Pancytopenia ([place])   ?  Paroxysmal atrial fibrillation ([facility])   ? Pneumonia   ? last itme about 16yrs ago  ? S/P TAVR (transcatheter aortic valve replacement)   ? Severe aortic stenosis   ? Sinus bradycardia   ? ? ?Current Outpatient Medications:  ?  albuterol (PROVENTIL) (2.5 MG/3ML) 0.083% nebulizer solution, Take 2.5 mg by nebulization every 6 (six) hours as needed for wheezing or shortness of breath., Disp: , Rfl:  ?  albuterol (VENTOLIN HFA) 108 (90 Base) MCG/ACT inhaler, INHALE 2 PUFFS INTO THE LUNGS EVERY 6 HOURS AS NEEDED FOR WHEEZING OR SHORTNESS OF BREATH, Disp: 8.5 g, Rfl: 1 ?  aspirin EC 81 MG tablet, Take 81 mg by mouth daily., Disp: , Rfl:  ?  azelastine (ASTELIN) 0.1 % nasal spray, Place 1 spray into both nostrils 2 (two) times daily. For runny nose, Disp: 30 mL, Rfl: 12 ?  Blood Glucose Monitoring Suppl (BLOOD GLUCOSE MONITOR SYSTEM) w/Device KIT, 1 Device by Does not apply route 2 (two) times daily. Dx E11.9, Disp: 1 kit, Rfl: 0 ?  calcium carbonate (OSCAL) 1500 (600 Ca) MG TABS tablet, Take by mouth 2 (two) times daily with a meal., Disp: , Rfl:  ?  clopidogrel (PLAVIX) 75 MG tablet, TAKE ONE (1) TABLET EACH DAY, Disp:  90 tablet, Rfl: 0 ?  dapagliflozin propanediol (FARXIGA) 10 MG TABS tablet, TAKE ONE TABLET EACH MORNING BEFORE BREAKFAST, Disp: 30 tablet, Rfl: 12 ?  donepezil (ARICEPT) 5 MG tablet, Take 5 mg (1 pill) daily for 4 weeks, then increase to 10 mg (2 pills) daily, Disp: 60 tablet, Rfl: 2 ?  ferrous sulfate 325 (65 FE) MG EC tablet, Take 325 mg by mouth every Monday, Wednesday, and Friday. , Disp: , Rfl:  ?  glipiZIDE (GLUCOTROL) 5 MG tablet, Take 1 tablet (5 mg total) by mouth 2 (two) times daily before a meal., Disp: 180 tablet, Rfl: 3 ?  glucose blood (COOL BLOOD GLUCOSE TEST STRIPS) test strip, Use to check BS BID dx E11.9, Disp: 100 each, Rfl: 3 ?  Lancet Device MISC, Use to check BS BID Dx E11.9, Disp: 100 each, Rfl: 3 ?  levothyroxine (SYNTHROID) 100 MCG tablet, TAKE ONE (1) TABLET EACH DAY, Disp: 90  tablet, Rfl: 3 ?  lubiprostone (AMITIZA) 24 MCG capsule, TAKE 1 CAPSULE TWICE A DAY WITH MEALS., Disp: 60 capsule, Rfl: 2 ?  methocarbamol (ROBAXIN) 500 MG tablet, TAKE 1/2 TO 1 TABLET BY MOUTH TWICE DAILY AS NEEDED FOR MUSCLE SPASMS, Disp: 30 tablet, Rfl: 0 ?  mirtazapine (REMERON) 15 MG tablet, Take 1 tablet (15 mg total) by mouth at bedtime., Disp: 90 tablet, Rfl: 1 ?  Multiple Vitamin (MULTIVITAMIN WITH MINERALS) TABS, Take 1 tablet by mouth daily., Disp: , Rfl:  ?  nitroGLYCERIN (NITROSTAT) 0.4 MG SL tablet, DISSOLVE 1 TAB UNDER TOUNGE FOR CHEST PAIN. MAY REPEAT EVERY 5 MINUTES FOR 3 DOSES. IF NO RELIEF CALL 911 OR GO TO ER, Disp: 25 tablet, Rfl: 2 ?  ondansetron (ZOFRAN-ODT) 4 MG disintegrating tablet, TAKE 1 TABLET BY MOUTH EVERY 8 HOURS AS NEEDED FOR NAUSEA & VOMITING, Disp: 30 tablet, Rfl: 0 ?  pantoprazole (PROTONIX) 40 MG tablet, TAKE ONE (1) TABLET EACH DAY, Disp: 90 tablet, Rfl: 0 ?  rosuvastatin (CRESTOR) 10 MG tablet, TAKE ONE (1) TABLET EACH DAY, Disp: 90 tablet, Rfl: 3 ?  Semaglutide (RYBELSUS) 7 MG TABS, Take 7 mg by mouth daily., Disp: 30 tablet, Rfl: 12 ?  sodium chloride (OCEAN) 0.65 % nasal spray, Place into the nose., Disp: , Rfl:  ?  SYMBICORT 80-4.5 MCG/ACT inhaler, Inhale 2 puffs into the lungs 2 (two) times daily as needed., Disp: , Rfl:  ?  torsemide (DEMADEX) 20 MG tablet, TAKE TWO TABLETS BY MOUTH TWICE DAILY, Disp: 120 tablet, Rfl: 2 ?  traZODone (DESYREL) 50 MG tablet, TAKE 1/2 TO 1 TABLET AT BEDTIME AS NEEDED FOR SLEEP, Disp: 90 tablet, Rfl: 1 ?  acetaZOLAMIDE (DIAMOX) 250 MG tablet, Take by mouth., Disp: , Rfl:  ?  dextromethorphan-guaiFENesin (MUCINEX DM) 30-600 MG 12hr tablet, Take 1 tablet by mouth 2 (two) times daily. (Patient not taking: Reported on 07/27/2021), Disp: 30 tablet, Rfl: 0 ?  lidocaine (LIDODERM) 5 %, APPLY 1 PATCH AND LEAVE ON FOR 12 HOURS THEN OFF FOR 12 HOURS (Patient not taking: Reported on 07/27/2021), Disp: 30 patch, Rfl: 12 ?  loperamide (IMODIUM) 2 MG  capsule, TAKE 2 CAPSULES BY MOUTH AS NEEDED FOR DIARRHEA/LOOSE STOOL. FOLLOWED BY 1 CAPSULE AFTER EACH LOOSE STOOL (MAX OF 8 CAPSULES DAILY) (Patient not taking: Reported on 07/27/2021), Disp: 30 capsule, Rfl: 1 ?  spironolactone (ALDACTONE) 25 MG tablet, Take 25 mg by mouth daily. , Disp: , Rfl:  ?Social History  ? ?Socioeconomic History  ? Marital status: Married  ?  Spouse  name: Not on file  ? Number of children: Not on file  ? Years of education: Not on file  ? Highest education level: Not on file  ?Occupational History  ? Occupation: retired  ?Tobacco Use  ? Smoking status: Former  ? Smokeless tobacco: Never  ? Tobacco comments:  ?  quit smoking 20+yrs ago  ?Vaping Use  ? Vaping Use: Never used  ?Substance and Sexual Activity  ? Alcohol use: No  ?  Comment: last drank about 2-3 years ago  ? Drug use: No  ? Sexual activity: Yes  ?Other Topics Concern  ? Not on file  ?Social History Narrative  ? Lives with his wife - their grandson lives with them at times  ?    ? ?Social Determinants of Health  ? ?Financial Resource Strain: Not on file  ?Food Insecurity: Not on file  ?Transportation Needs: Not on file  ?Physical Activity: Insufficiently Active  ? Days of Exercise per Week: 5 days  ? Minutes of Exercise per Session: 20 min  ?Stress: Stress Concern Present  ? Feeling of Stress : To some extent  ?Social Connections: Not on file  ?Intimate Partner Violence: Not on file  ? ?Family History  ?Problem Relation Age of Onset  ? Cancer Mother   ?     Unknown type  ? Heart disease Father   ? Diabetes Father   ? Heart disease Sister   ? Seizures Brother   ? Diabetes Daughter   ? Diabetes Daughter   ? Heart attack Son   ? Colon cancer Mother   ?     in her 46s.   ? Stomach cancer Neg Hx   ? Esophageal cancer Neg Hx   ? ? ?Objective: ?Office vital signs reviewed. ?BP 106/66   Pulse 79   Temp 98.2 ?F (36.8 ?C)   Ht $R'5\' 8"'vT$  (1.727 m)   Wt 202 lb (91.6 kg)   SpO2 (!) 87%   BMI 30.71 kg/m?  ? ?Physical Examination:  ?General:  Awake, alert, nontoxic-appearing male, No acute distress ?HEENT: Sclera white.  Dentition fair.  Moist mucous membranes ?Cardio: regular rate and rhythm, S1S2 heard, no murmurs appreciated ?Pulm: clear to auscultation bilat

## 2021-08-08 DIAGNOSIS — E1122 Type 2 diabetes mellitus with diabetic chronic kidney disease: Secondary | ICD-10-CM | POA: Diagnosis not present

## 2021-08-08 DIAGNOSIS — J449 Chronic obstructive pulmonary disease, unspecified: Secondary | ICD-10-CM | POA: Diagnosis not present

## 2021-08-08 DIAGNOSIS — I219 Acute myocardial infarction, unspecified: Secondary | ICD-10-CM | POA: Diagnosis not present

## 2021-08-08 DIAGNOSIS — N1831 Chronic kidney disease, stage 3a: Secondary | ICD-10-CM | POA: Diagnosis not present

## 2021-08-09 ENCOUNTER — Other Ambulatory Visit: Payer: Self-pay | Admitting: Family Medicine

## 2021-08-09 DIAGNOSIS — R197 Diarrhea, unspecified: Secondary | ICD-10-CM

## 2021-08-09 DIAGNOSIS — D500 Iron deficiency anemia secondary to blood loss (chronic): Secondary | ICD-10-CM

## 2021-08-29 ENCOUNTER — Telehealth: Payer: Medicare Other

## 2021-08-31 DIAGNOSIS — J449 Chronic obstructive pulmonary disease, unspecified: Secondary | ICD-10-CM | POA: Diagnosis not present

## 2021-09-05 ENCOUNTER — Other Ambulatory Visit: Payer: Self-pay | Admitting: Family Medicine

## 2021-09-05 ENCOUNTER — Other Ambulatory Visit: Payer: Self-pay | Admitting: Psychiatry

## 2021-09-05 DIAGNOSIS — F0280 Dementia in other diseases classified elsewhere without behavioral disturbance: Secondary | ICD-10-CM

## 2021-09-05 DIAGNOSIS — R197 Diarrhea, unspecified: Secondary | ICD-10-CM

## 2021-09-05 DIAGNOSIS — G309 Alzheimer's disease, unspecified: Secondary | ICD-10-CM

## 2021-09-06 ENCOUNTER — Other Ambulatory Visit: Payer: Self-pay | Admitting: Family Medicine

## 2021-09-06 DIAGNOSIS — F419 Anxiety disorder, unspecified: Secondary | ICD-10-CM

## 2021-09-06 DIAGNOSIS — J449 Chronic obstructive pulmonary disease, unspecified: Secondary | ICD-10-CM

## 2021-09-06 DIAGNOSIS — F5101 Primary insomnia: Secondary | ICD-10-CM

## 2021-09-07 DIAGNOSIS — I219 Acute myocardial infarction, unspecified: Secondary | ICD-10-CM | POA: Diagnosis not present

## 2021-09-07 DIAGNOSIS — E1122 Type 2 diabetes mellitus with diabetic chronic kidney disease: Secondary | ICD-10-CM | POA: Diagnosis not present

## 2021-09-07 DIAGNOSIS — N1831 Chronic kidney disease, stage 3a: Secondary | ICD-10-CM | POA: Diagnosis not present

## 2021-09-07 DIAGNOSIS — J449 Chronic obstructive pulmonary disease, unspecified: Secondary | ICD-10-CM | POA: Diagnosis not present

## 2021-09-07 NOTE — Telephone Encounter (Signed)
Last OV 08/06/21. Last RF 08/09/21. Next OV 11/06/21  Please advise on zofran and cream.  Covering pcp- please advise

## 2021-09-14 ENCOUNTER — Encounter: Payer: Self-pay | Admitting: Family Medicine

## 2021-09-14 ENCOUNTER — Ambulatory Visit (INDEPENDENT_AMBULATORY_CARE_PROVIDER_SITE_OTHER): Payer: Medicare Other | Admitting: Family Medicine

## 2021-09-14 DIAGNOSIS — J069 Acute upper respiratory infection, unspecified: Secondary | ICD-10-CM

## 2021-09-14 MED ORDER — IPRATROPIUM BROMIDE 0.03 % NA SOLN: 2.0000 | Freq: Two times a day (BID) | NASAL | 0 refills | Status: DC

## 2021-09-14 NOTE — Progress Notes (Signed)
Virtual Visit via Telephone Note  I connected with Matthew Campos on 09/14/21 at 12:58 PM by telephone and verified that I am speaking with the correct person using two identifiers. Matthew Campos is currently located at home and nobody is currently with him during this visit. The provider, Gwenlyn Fudge, FNP is located in their home at time of visit.  I discussed the limitations, risks, security and privacy concerns of performing an evaluation and management service by telephone and the availability of in person appointments. I also discussed with the patient that there may be a patient responsible charge related to this service. The patient expressed understanding and agreed to proceed.  Subjective: PCP: Raliegh Ip, DO  Chief Complaint  Patient presents with   URI   Patient complains of cough, runny nose, sneezing, sore throat, and postnasal drainage. Onset of symptoms was 3 days ago, gradually worsening since that time. He is drinking plenty of fluids. Evaluation to date: none. Treatment to date:  Tylenol Cold . He has a history of COPD. He does not smoke.    ROS: Per HPI  Current Outpatient Medications:    acetaZOLAMIDE (DIAMOX) 250 MG tablet, Take by mouth., Disp: , Rfl:    albuterol (PROVENTIL) (2.5 MG/3ML) 0.083% nebulizer solution, Take 2.5 mg by nebulization every 6 (six) hours as needed for wheezing or shortness of breath., Disp: , Rfl:    albuterol (VENTOLIN HFA) 108 (90 Base) MCG/ACT inhaler, INHALE 2 PUFFS INTO THE LUNGS EVERY 6 HOURS AS NEEDED FOR WHEEZING OR SHORTNESS OF BREATH, Disp: 8.5 g, Rfl: 1   aspirin EC 81 MG tablet, Take 81 mg by mouth daily., Disp: , Rfl:    azelastine (ASTELIN) 0.1 % nasal spray, Place 1 spray into both nostrils 2 (two) times daily. For runny nose, Disp: 30 mL, Rfl: 12   Blood Glucose Monitoring Suppl (BLOOD GLUCOSE MONITOR SYSTEM) w/Device KIT, 1 Device by Does not apply route 2 (two) times daily. Dx E11.9, Disp: 1 kit, Rfl: 0    calcium carbonate (OSCAL) 1500 (600 Ca) MG TABS tablet, Take by mouth 2 (two) times daily with a meal., Disp: , Rfl:    clopidogrel (PLAVIX) 75 MG tablet, TAKE ONE (1) TABLET EACH DAY, Disp: 90 tablet, Rfl: 0   clotrimazole-betamethasone (LOTRISONE) cream, APPLY TWICE DAILY TO TORSO FOR UP TO 14 DAYS, Disp: 45 g, Rfl: 0   dapagliflozin propanediol (FARXIGA) 10 MG TABS tablet, TAKE ONE TABLET EACH MORNING BEFORE BREAKFAST, Disp: 30 tablet, Rfl: 12   dextromethorphan-guaiFENesin (MUCINEX DM) 30-600 MG 12hr tablet, Take 1 tablet by mouth 2 (two) times daily. (Patient not taking: Reported on 07/27/2021), Disp: 30 tablet, Rfl: 0   donepezil (ARICEPT) 5 MG tablet, TAKE TWO TABLETS BY MOUTH DAILY, Disp: 60 tablet, Rfl: 2   ferrous sulfate 325 (65 FE) MG EC tablet, Take 325 mg by mouth every Monday, Wednesday, and Friday. , Disp: , Rfl:    glipiZIDE (GLUCOTROL) 5 MG tablet, Take 1 tablet (5 mg total) by mouth 2 (two) times daily before a meal., Disp: 180 tablet, Rfl: 3   glucose blood (COOL BLOOD GLUCOSE TEST STRIPS) test strip, Use to check BS BID dx E11.9, Disp: 100 each, Rfl: 3   Lancet Device MISC, Use to check BS BID Dx E11.9, Disp: 100 each, Rfl: 3   levothyroxine (SYNTHROID) 100 MCG tablet, TAKE ONE (1) TABLET EACH DAY, Disp: 90 tablet, Rfl: 3   lidocaine (LIDODERM) 5 %, APPLY 1 PATCH AND LEAVE  ON FOR 12 HOURS THEN OFF FOR 12 HOURS (Patient not taking: Reported on 07/27/2021), Disp: 30 patch, Rfl: 12   loperamide (IMODIUM) 2 MG capsule, TAKE 2 CAPSULES BY MOUTH AS NEEDED FOR DIARRHEA/LOOSE STOOL. FOLLOWED BY 1 CAPSULE AFTER EACH LOOSE STOOL (MAX OF 8 CAPSULES DAILY), Disp: 30 capsule, Rfl: 1   lubiprostone (AMITIZA) 24 MCG capsule, TAKE 1 CAPSULE TWICE A DAY WITH MEALS., Disp: 60 capsule, Rfl: 2   methocarbamol (ROBAXIN) 500 MG tablet, TAKE 1/2 TO 1 TABLET BY MOUTH TWICE DAILY AS NEEDED FOR MUSCLE SPASMS, Disp: 30 tablet, Rfl: 0   mirtazapine (REMERON) 15 MG tablet, TAKE ONE TABLET BY MOUTH AT BEDTIME,  Disp: 90 tablet, Rfl: 0   Multiple Vitamin (MULTIVITAMIN WITH MINERALS) TABS, Take 1 tablet by mouth daily., Disp: , Rfl:    nitroGLYCERIN (NITROSTAT) 0.4 MG SL tablet, DISSOLVE 1 TAB UNDER TOUNGE FOR CHEST PAIN. MAY REPEAT EVERY 5 MINUTES FOR 3 DOSES. IF NO RELIEF CALL 911 OR GO TO ER, Disp: 25 tablet, Rfl: 2   ondansetron (ZOFRAN-ODT) 4 MG disintegrating tablet, TAKE 1 TABLET BY MOUTH EVERY 8 HOURS AS NEEDED FOR NAUSEA & VOMITING, Disp: 30 tablet, Rfl: 0   pantoprazole (PROTONIX) 40 MG tablet, TAKE ONE (1) TABLET EACH DAY, Disp: 90 tablet, Rfl: 1   rosuvastatin (CRESTOR) 10 MG tablet, TAKE ONE (1) TABLET EACH DAY, Disp: 90 tablet, Rfl: 0   Semaglutide (RYBELSUS) 7 MG TABS, Take 7 mg by mouth daily., Disp: 30 tablet, Rfl: 12   sodium chloride (OCEAN) 0.65 % nasal spray, Place into the nose., Disp: , Rfl:    spironolactone (ALDACTONE) 25 MG tablet, Take 25 mg by mouth daily. , Disp: , Rfl:    SYMBICORT 80-4.5 MCG/ACT inhaler, Inhale 2 puffs into the lungs 2 (two) times daily as needed., Disp: , Rfl:    torsemide (DEMADEX) 20 MG tablet, TAKE TWO TABLETS BY MOUTH TWICE DAILY, Disp: 120 tablet, Rfl: 2   traZODone (DESYREL) 50 MG tablet, TAKE 1/2 TO 1 TABLET AT BEDTIME AS NEEDED FOR SLEEP, Disp: 30 tablet, Rfl: 0  Allergies  Allergen Reactions   Codeine Nausea And Vomiting   Latex Hives and Itching   Morphine    Lisinopril Cough   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   Abdominal aortic aneurysm without rupture (HCC)    Anemia    Anxiety and depression    Aortic stenosis    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HCC)    CHF (congestive heart failure) (HCC)    Cholecystitis    Chronic ischemic heart disease    Complication of anesthesia    hard to be put to sleep   COPD (chronic obstructive pulmonary disease) (HCC)    Coronary artery disease    Diabetes mellitus without complication (HCC)    Diabetes mellitus without complication  (HCC)    takes Januvia and Metformin daily   GERD (gastroesophageal reflux disease)    GERD (gastroesophageal reflux disease)    takes omeprazole daily   Headache(784.0)    sinus   History of bronchitis    last time >58yrs ago   Hyperlipidemia    takes Lipitor daily   Hypertension    Hypertension    takes Metoprolol daily   Infected sebaceous cyst 01/30/2021   Joint pain    Myocardial infarction (HCC)    x 3;last one about 3-44yrs ago   Neoplasm of uncertain behavior of bladder  Obesity    OSA (obstructive sleep apnea)    Pacemaker    Pancytopenia (HCC)    Paroxysmal atrial fibrillation (HCC)    Pneumonia    last itme about 18yrs ago   S/P TAVR (transcatheter aortic valve replacement)    Severe aortic stenosis    Sinus bradycardia     Observations/Objective: A&O  No respiratory distress or wheezing audible over the phone Mood, judgement, and thought processes all WNL  Assessment and Plan: 1. Viral URI Discussed symptom management and typical course of viral URIs. - ipratropium (ATROVENT) 0.03 % nasal spray; Place 2 sprays into both nostrils every 12 (twelve) hours for 3 days.  Dispense: 30 mL; Refill: 0   Follow Up Instructions:  I discussed the assessment and treatment plan with the patient. The patient was provided an opportunity to ask questions and all were answered. The patient agreed with the plan and demonstrated an understanding of the instructions.   The patient was advised to call back or seek an in-person evaluation if the symptoms worsen or if the condition fails to improve as anticipated.  The above assessment and management plan was discussed with the patient. The patient verbalized understanding of and has agreed to the management plan. Patient is aware to call the clinic if symptoms persist or worsen. Patient is aware when to return to the clinic for a follow-up visit. Patient educated on when it is appropriate to go to the emergency department.   Time  call ended: 1:09 PM  I provided 11 minutes of non-face-to-face time during this encounter.  Hendricks Limes, MSN, APRN, FNP-C [facility] 09/14/21

## 2021-09-17 ENCOUNTER — Telehealth: Payer: Self-pay | Admitting: Family Medicine

## 2021-09-17 ENCOUNTER — Telehealth: Payer: Self-pay | Admitting: Psychiatry

## 2021-09-17 ENCOUNTER — Ambulatory Visit (INDEPENDENT_AMBULATORY_CARE_PROVIDER_SITE_OTHER): Payer: Medicare Other | Admitting: Licensed Clinical Social Worker

## 2021-09-17 DIAGNOSIS — E1159 Type 2 diabetes mellitus with other circulatory complications: Secondary | ICD-10-CM

## 2021-09-17 DIAGNOSIS — J449 Chronic obstructive pulmonary disease, unspecified: Secondary | ICD-10-CM

## 2021-09-17 DIAGNOSIS — F32A Depression, unspecified: Secondary | ICD-10-CM

## 2021-09-17 DIAGNOSIS — G4733 Obstructive sleep apnea (adult) (pediatric): Secondary | ICD-10-CM

## 2021-09-17 DIAGNOSIS — N1831 Chronic kidney disease, stage 3a: Secondary | ICD-10-CM

## 2021-09-17 DIAGNOSIS — E782 Mixed hyperlipidemia: Secondary | ICD-10-CM

## 2021-09-17 DIAGNOSIS — F331 Major depressive disorder, recurrent, moderate: Secondary | ICD-10-CM

## 2021-09-17 DIAGNOSIS — D414 Neoplasm of uncertain behavior of bladder: Secondary | ICD-10-CM

## 2021-09-17 DIAGNOSIS — I5032 Chronic diastolic (congestive) heart failure: Secondary | ICD-10-CM

## 2021-09-17 NOTE — Telephone Encounter (Signed)
Patient's granddaughter calling to discuss home health. Said she spoke with [name] about a month ago and they were waiting for patient to have insurance. Please call back.

## 2021-09-17 NOTE — Chronic Care Management (AMB) (Signed)
Chronic Care Management    Clinical Social Work Note  09/17/2021 Name: Matthew Campos MRN: [id number] DOB: 09/02/1940  Matthew Campos is a 81 y.o. year old male who is a primary care patient of [name], DO. The CCM team was consulted to assist the patient with chronic disease management and/or care coordination needs related to: [employer] .   Engaged with patient /granddaughter of patient, Matthew Campos for follow up visit in response to provider referral for social work chronic care management and care coordination services.   Consent to Services:  The patient was given information about Chronic Care Management services, agreed to services, and gave verbal consent prior to initiation of services.  Please see initial visit note for detailed documentation.   Patient agreed to services and consent obtained.   Assessment: Review of patient past medical history, allergies, medications, and health status, including review of relevant consultants reports was performed today as part of a comprehensive evaluation and provision of chronic care management and care coordination services.     SDOH (Social Determinants of Health) assessments and interventions performed:  SDOH Interventions    Flowsheet Row Most Recent Value  SDOH Interventions   Physical Activity Interventions Other (Comments)  [walking challenges. he uses a walker or a wheelchair as needed to help him walk]  Stress Interventions Other (Comment)  [client has stress related to managing medical issues. client has stress related to memory issues]  Depression Interventions/Treatment  Currently on Treatment        Advanced Directives Status: See [employer] application for related entries.  CCM Care Plan  Allergies  Allergen Reactions   Codeine Nausea And Vomiting   Latex Hives and Itching   Morphine    Lisinopril Cough    Outpatient Encounter Medications as of 09/17/2021  Medication Sig    acetaZOLAMIDE (DIAMOX) 250 MG tablet Take by mouth.   albuterol (PROVENTIL) (2.5 MG/3ML) 0.083% nebulizer solution Take 2.5 mg by nebulization every 6 (six) hours as needed for wheezing or shortness of breath.   albuterol (VENTOLIN HFA) 108 (90 Base) MCG/ACT inhaler INHALE 2 PUFFS INTO THE LUNGS EVERY 6 HOURS AS NEEDED FOR WHEEZING OR SHORTNESS OF BREATH   aspirin EC 81 MG tablet Take 81 mg by mouth daily.   azelastine (ASTELIN) 0.1 % nasal spray Place 1 spray into both nostrils 2 (two) times daily. For runny nose   Blood Glucose Monitoring Suppl (BLOOD GLUCOSE MONITOR SYSTEM) w/Device KIT 1 Device by Does not apply route 2 (two) times daily. Dx E11.9   calcium carbonate (OSCAL) 1500 (600 Ca) MG TABS tablet Take by mouth 2 (two) times daily with a meal.   clopidogrel (PLAVIX) 75 MG tablet TAKE ONE (1) TABLET EACH DAY   clotrimazole-betamethasone (LOTRISONE) cream APPLY TWICE DAILY TO TORSO FOR UP TO 14 DAYS   dapagliflozin propanediol (FARXIGA) 10 MG TABS tablet TAKE ONE TABLET EACH MORNING BEFORE BREAKFAST   dextromethorphan-guaiFENesin (MUCINEX DM) 30-600 MG 12hr tablet Take 1 tablet by mouth 2 (two) times daily. (Patient not taking: Reported on 07/27/2021)   donepezil (ARICEPT) 5 MG tablet TAKE TWO TABLETS BY MOUTH DAILY   ferrous sulfate 325 (65 FE) MG EC tablet Take 325 mg by mouth every Monday, Wednesday, and Friday.    glipiZIDE (GLUCOTROL) 5 MG tablet Take 1 tablet (5 mg total) by mouth 2 (two) times daily before a meal.   glucose blood (COOL BLOOD GLUCOSE TEST STRIPS) test strip Use to check BS BID dx E11.9  ipratropium (ATROVENT) 0.03 % nasal spray Place 2 sprays into both nostrils every 12 (twelve) hours for 3 days.   Lancet Device MISC Use to check BS BID Dx E11.9   levothyroxine (SYNTHROID) 100 MCG tablet TAKE ONE (1) TABLET EACH DAY   lidocaine (LIDODERM) 5 % APPLY 1 PATCH AND LEAVE ON FOR 12 HOURS THEN OFF FOR 12 HOURS (Patient not taking: Reported on 07/27/2021)   loperamide  (IMODIUM) 2 MG capsule TAKE 2 CAPSULES BY MOUTH AS NEEDED FOR DIARRHEA/LOOSE STOOL. FOLLOWED BY 1 CAPSULE AFTER EACH LOOSE STOOL (MAX OF 8 CAPSULES DAILY)   lubiprostone (AMITIZA) 24 MCG capsule TAKE 1 CAPSULE TWICE A DAY WITH MEALS.   methocarbamol (ROBAXIN) 500 MG tablet TAKE 1/2 TO 1 TABLET BY MOUTH TWICE DAILY AS NEEDED FOR MUSCLE SPASMS   mirtazapine (REMERON) 15 MG tablet TAKE ONE TABLET BY MOUTH AT BEDTIME   Multiple Vitamin (MULTIVITAMIN WITH MINERALS) TABS Take 1 tablet by mouth daily.   nitroGLYCERIN (NITROSTAT) 0.4 MG SL tablet DISSOLVE 1 TAB UNDER TOUNGE FOR CHEST PAIN. MAY REPEAT EVERY 5 MINUTES FOR 3 DOSES. IF NO RELIEF CALL 911 OR GO TO ER   ondansetron (ZOFRAN-ODT) 4 MG disintegrating tablet TAKE 1 TABLET BY MOUTH EVERY 8 HOURS AS NEEDED FOR NAUSEA & VOMITING   pantoprazole (PROTONIX) 40 MG tablet TAKE ONE (1) TABLET EACH DAY   rosuvastatin (CRESTOR) 10 MG tablet TAKE ONE (1) TABLET EACH DAY   Semaglutide (RYBELSUS) 7 MG TABS Take 7 mg by mouth daily.   sodium chloride (OCEAN) 0.65 % nasal spray Place into the nose.   spironolactone (ALDACTONE) 25 MG tablet Take 25 mg by mouth daily.    SYMBICORT 80-4.5 MCG/ACT inhaler Inhale 2 puffs into the lungs 2 (two) times daily as needed.   torsemide (DEMADEX) 20 MG tablet TAKE TWO TABLETS BY MOUTH TWICE DAILY   traZODone (DESYREL) 50 MG tablet TAKE 1/2 TO 1 TABLET AT BEDTIME AS NEEDED FOR SLEEP   No facility-administered encounter medications on file as of 09/17/2021.    Patient Active Problem List   Diagnosis Date Noted   Low back pain 06/20/2021   Alzheimer's dementia without behavioral disturbance ([place]) 04/19/2021   Acquired hypothyroidism 04/19/2021   Diarrhea 03/26/2021   Hyperlipidemia associated with type 2 diabetes mellitus ([place]) 02/04/2020   CAP (community acquired pneumonia) 09/07/2019   Hypomagnesemia 09/07/2019   CKD (chronic kidney disease), stage III ([place]) 09/07/2019   Volume overload 09/07/2019   Community acquired  pneumonia 09/07/2019   GI bleeding 04/14/2019   Chronic diastolic heart failure (HCC)    Bradycardia    Renal insufficiency    Chest pain 04/13/2019   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/13/2019   Cholecystostomy care ([place]) 04/13/2019   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   COPD (chronic obstructive pulmonary disease) (HCC)    Coronary artery disease    Chronic ischemic heart disease    Paroxysmal atrial fibrillation (HCC)    OSA (obstructive sleep apnea)    Coagulopathy (HCC)    Chronic anticoagulation    Dysphagia    Elevated alkaline phosphatase measurement    Elevated bilirubin    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   Sinus bradycardia 02/18/2019   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   Normocytic anemia 12/23/2018   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[place]) 12/23/2018   Severe aortic stenosis 11/26/2018   Anxiety 10/28/2018   CHF (congestive heart failure) ([place]) 10/28/2018   At risk for falls 10/28/2018  Oxygen dependent 10/28/2018   Major depressive disorder, single episode, 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 Manage anxiety issues   Start Date: 06/04/2021  Expected End Date: 12/06/2021  This Visit's Progress: Not on track  Recent Progress: On track  Priority: High  Note:   Current Barriers:  Mental Health needs related to  depression and depression management Mobility issues Anxiety issues Memory challenges Suicidal Ideation/Homicidal Ideation: No  Clinical Social Work Goal(s):  patient will work with SW monthly by Campos or in person to reduce or manage symptoms related to depression and depression management Client or spouse to call RNCM as needed in next 30 days to address nursing needs of client Patient will communicate with LCSW in next 30 days to discuss mobility challenges of client  Interventions: 1:1 collaboration with [name], DO regarding development and update of comprehensive plan of care as evidenced by provider attestation and co-signature Reviewed client needs with Matthew Campos, granddaughter of client. Discussed sleeping challenges of client . Discussed appetite of client. Reviewed in home care needs of client. Matthew Campos reported that family had applied for Medicaid for [name]. Matthew Campos said that Medicaid was recently approved for client.  LCSW encouraged Matthew Campos to call Matthew Haring LPN at [facility] to seek her help in completing PCS application for client.  LCSW informed Matthew Campos that she would need Medicaid ID Campos for client to provide this Campos to Matthew Haring, LPN.  Matthew Campos said she would call Matthew Haring, LPN to ask her to begin [facility] application for client. Matthew Campos discussed in home strain on Matthew Campos, spouse of client. LCSW discussed with Matthew Campos CAP support and PCS support and typical time lines related to application approvals for each . Reviewed family support of client. Spouse of client is supportive to client. Son of client is supportive to client. Granddaughter of client is supportive of client  Discussed behavior of client. Matthew Campos said client is more angry recently.  Client has acted more aggressive and argumentative recently.  Matthew Campos said that Matthew Campos was exhausted, was under care giver strain. Family is hoping that client can be approved for PCS  services in the home LCSW encouraged Matthew Campos or Matthew Campos to call LCSW as needed to further discuss client needs  Patient Self Care Activities:  Completes ADLs as able Attends scheduled medical appointments  Patient Coping Strengths:  Support received from his wife, [place] received from his son.  Patient Self Care Deficits:  Walking challenges  Patient Goals:  - spend time or talk with others at least 2 to 3 times per week - practice relaxation or meditation daily - keep a calendar with appointment dates  Follow Up Plan: LCSW to call client or spouse of client on 10/22/21 at 3:00 PM     [name]. MSW, [place] representative [facility] Care Management [phone]

## 2021-09-17 NOTE — Patient Instructions (Addendum)
Visit Information  Patient Goals:  Manage Emotions. Manage Depression symptoms. Manage anxiety issues  Timeframe:  Short-Term Goal Priority:  High  Progress: Not On Track Start Date:           06/04/21                   Expected End Date:          12/06/21                  Follow Up Date 10/22/21 at 3:00 PM    Manage Emotions. Manage Depression symptoms . Manage anxiety issues   Why is this important?   When you are stressed, down or upset, your body reacts too.  For example, your blood pressure may get higher; you may have a headache or stomachache.  When your emotions get the best of you, your body's ability to fight off cold and flu gets weak.  These steps will help you manage your emotions.     Patient Self Care Activities:  Completes ADLs as able Attends scheduled medical appointments  Patient Coping Strengths:  Support received from his wife, [place] received from his son.  Patient Self Care Deficits:  Walking challenges  Patient Goals:  - spend time or talk with others at least 2 to 3 times per week - practice relaxation or meditation daily - keep a calendar with appointment dates  Follow Up Plan: LCSW to call client or spouse of client on 10/22/21 at 3:00 PM   [name].Saron Vanorman MSW, [place] Holiday representative [facility] Care Management [phone]

## 2021-09-17 NOTE — Telephone Encounter (Signed)
Pt's granddaughter called and asked an appointment be made for pt due to him worsening (combative and angry).  Granddaughter did not want pt to wait for Dr Georgina Peer next available appointment (late October) phone rep offered a f/u with Sarah,NP .  Pt's appointment is on wait list.  This is FYI for RN's  and NP, no call back requested

## 2021-09-17 NOTE — Telephone Encounter (Signed)
Pt scheduled  

## 2021-09-28 ENCOUNTER — Ambulatory Visit: Payer: Medicare Other | Admitting: Family Medicine

## 2021-10-01 DIAGNOSIS — J449 Chronic obstructive pulmonary disease, unspecified: Secondary | ICD-10-CM | POA: Diagnosis not present

## 2021-10-03 ENCOUNTER — Other Ambulatory Visit: Payer: Self-pay | Admitting: Nurse Practitioner

## 2021-10-03 ENCOUNTER — Other Ambulatory Visit: Payer: Self-pay | Admitting: Family Medicine

## 2021-10-03 DIAGNOSIS — I5033 Acute on chronic diastolic (congestive) heart failure: Secondary | ICD-10-CM

## 2021-10-03 DIAGNOSIS — R197 Diarrhea, unspecified: Secondary | ICD-10-CM

## 2021-10-03 DIAGNOSIS — K5909 Other constipation: Secondary | ICD-10-CM

## 2021-10-03 DIAGNOSIS — F5101 Primary insomnia: Secondary | ICD-10-CM

## 2021-10-08 DIAGNOSIS — E1122 Type 2 diabetes mellitus with diabetic chronic kidney disease: Secondary | ICD-10-CM | POA: Diagnosis not present

## 2021-10-08 DIAGNOSIS — N1831 Chronic kidney disease, stage 3a: Secondary | ICD-10-CM | POA: Diagnosis not present

## 2021-10-08 DIAGNOSIS — I219 Acute myocardial infarction, unspecified: Secondary | ICD-10-CM | POA: Diagnosis not present

## 2021-10-08 DIAGNOSIS — J449 Chronic obstructive pulmonary disease, unspecified: Secondary | ICD-10-CM | POA: Diagnosis not present

## 2021-10-10 ENCOUNTER — Ambulatory Visit: Payer: Medicare Other | Admitting: Neurology

## 2021-10-12 DIAGNOSIS — N1831 Chronic kidney disease, stage 3a: Secondary | ICD-10-CM

## 2021-10-12 DIAGNOSIS — F32A Depression, unspecified: Secondary | ICD-10-CM

## 2021-10-12 DIAGNOSIS — J449 Chronic obstructive pulmonary disease, unspecified: Secondary | ICD-10-CM

## 2021-10-12 DIAGNOSIS — F331 Major depressive disorder, recurrent, moderate: Secondary | ICD-10-CM

## 2021-10-12 DIAGNOSIS — I5032 Chronic diastolic (congestive) heart failure: Secondary | ICD-10-CM

## 2021-10-12 DIAGNOSIS — I152 Hypertension secondary to endocrine disorders: Secondary | ICD-10-CM

## 2021-10-12 DIAGNOSIS — E1159 Type 2 diabetes mellitus with other circulatory complications: Secondary | ICD-10-CM

## 2021-10-12 DIAGNOSIS — E1122 Type 2 diabetes mellitus with diabetic chronic kidney disease: Secondary | ICD-10-CM

## 2021-10-12 DIAGNOSIS — F419 Anxiety disorder, unspecified: Secondary | ICD-10-CM

## 2021-10-12 DIAGNOSIS — E782 Mixed hyperlipidemia: Secondary | ICD-10-CM

## 2021-10-22 ENCOUNTER — Telehealth: Payer: Medicare Other

## 2021-10-31 DIAGNOSIS — J449 Chronic obstructive pulmonary disease, unspecified: Secondary | ICD-10-CM | POA: Diagnosis not present

## 2021-11-01 ENCOUNTER — Other Ambulatory Visit: Payer: Self-pay | Admitting: Nurse Practitioner

## 2021-11-01 ENCOUNTER — Other Ambulatory Visit: Payer: Self-pay | Admitting: Family Medicine

## 2021-11-01 ENCOUNTER — Other Ambulatory Visit: Payer: Self-pay | Admitting: Psychiatry

## 2021-11-01 DIAGNOSIS — R197 Diarrhea, unspecified: Secondary | ICD-10-CM

## 2021-11-01 DIAGNOSIS — J449 Chronic obstructive pulmonary disease, unspecified: Secondary | ICD-10-CM

## 2021-11-01 DIAGNOSIS — F0280 Dementia in other diseases classified elsewhere without behavioral disturbance: Secondary | ICD-10-CM

## 2021-11-01 DIAGNOSIS — I5033 Acute on chronic diastolic (congestive) heart failure: Secondary | ICD-10-CM

## 2021-11-01 DIAGNOSIS — F5101 Primary insomnia: Secondary | ICD-10-CM

## 2021-11-01 DIAGNOSIS — K5909 Other constipation: Secondary | ICD-10-CM

## 2021-11-01 NOTE — Telephone Encounter (Signed)
Last office visit 08/07/21 Upcoming appointment 11/06/21 Last refills on 10/05/21 Ondansetron #30, no refills Trazodone #30, no refills Clotrimazole 45 grams, no refills

## 2021-11-06 ENCOUNTER — Encounter: Payer: Self-pay | Admitting: Family Medicine

## 2021-11-06 ENCOUNTER — Ambulatory Visit (INDEPENDENT_AMBULATORY_CARE_PROVIDER_SITE_OTHER): Payer: Medicare Other | Admitting: Family Medicine

## 2021-11-06 VITALS — Temp 97.4°F | Ht 68.0 in | Wt 200.4 lb

## 2021-11-06 DIAGNOSIS — N1831 Chronic kidney disease, stage 3a: Secondary | ICD-10-CM

## 2021-11-06 DIAGNOSIS — E039 Hypothyroidism, unspecified: Secondary | ICD-10-CM

## 2021-11-06 DIAGNOSIS — E785 Hyperlipidemia, unspecified: Secondary | ICD-10-CM

## 2021-11-06 DIAGNOSIS — F02811 Dementia in other diseases classified elsewhere, unspecified severity, with agitation: Secondary | ICD-10-CM | POA: Diagnosis not present

## 2021-11-06 DIAGNOSIS — E1159 Type 2 diabetes mellitus with other circulatory complications: Secondary | ICD-10-CM

## 2021-11-06 DIAGNOSIS — E1122 Type 2 diabetes mellitus with diabetic chronic kidney disease: Secondary | ICD-10-CM

## 2021-11-06 DIAGNOSIS — R42 Dizziness and giddiness: Secondary | ICD-10-CM | POA: Diagnosis not present

## 2021-11-06 DIAGNOSIS — E1169 Type 2 diabetes mellitus with other specified complication: Secondary | ICD-10-CM | POA: Diagnosis not present

## 2021-11-06 DIAGNOSIS — I152 Hypertension secondary to endocrine disorders: Secondary | ICD-10-CM

## 2021-11-06 DIAGNOSIS — N1832 Chronic kidney disease, stage 3b: Secondary | ICD-10-CM | POA: Diagnosis not present

## 2021-11-06 DIAGNOSIS — G301 Alzheimer's disease with late onset: Secondary | ICD-10-CM

## 2021-11-06 LAB — BAYER DCA HB A1C WAIVED: HB A1C (BAYER DCA - WAIVED): 6.7 % — ABNORMAL HIGH (ref 4.8–5.6)

## 2021-11-06 MED ORDER — SYMBICORT 80-4.5 MCG/ACT IN AERO
2.0000 | INHALATION_SPRAY | Freq: Two times a day (BID) | RESPIRATORY_TRACT | 12 refills | Status: DC | PRN
Start: 2021-11-06 — End: 2022-04-25
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
## 2021-11-07 DIAGNOSIS — N1831 Chronic kidney disease, stage 3a: Secondary | ICD-10-CM | POA: Diagnosis not present

## 2021-11-07 DIAGNOSIS — I219 Acute myocardial infarction, unspecified: Secondary | ICD-10-CM | POA: Diagnosis not present

## 2021-11-07 DIAGNOSIS — E1122 Type 2 diabetes mellitus with diabetic chronic kidney disease: Secondary | ICD-10-CM | POA: Diagnosis not present

## 2021-11-07 DIAGNOSIS — J449 Chronic obstructive pulmonary disease, unspecified: Secondary | ICD-10-CM | POA: Diagnosis not present

## 2021-11-07 LAB — BMP8+EGFR
BUN/Creatinine Ratio: 18 (ref 10–24)
BUN: 28 mg/dL — ABNORMAL HIGH (ref 8–27)
CO2: 21 mmol/L (ref 20–29)
Calcium: 9.5 mg/dL (ref 8.6–10.2)
Chloride: 93 mmol/L — ABNORMAL LOW (ref 96–106)
Creatinine, Ser: 1.6 mg/dL — ABNORMAL HIGH (ref 0.76–1.27)
Glucose: 309 mg/dL — ABNORMAL HIGH (ref 70–99)
Potassium: 3.9 mmol/L (ref 3.5–5.2)
Sodium: 134 mmol/L (ref 134–144)
eGFR: 43 mL/min/{1.73_m2} — ABNORMAL LOW (ref >59)

## 2021-11-28 ENCOUNTER — Other Ambulatory Visit: Payer: Self-pay | Admitting: Nurse Practitioner

## 2021-11-28 ENCOUNTER — Other Ambulatory Visit: Payer: Self-pay | Admitting: Family Medicine

## 2021-11-28 DIAGNOSIS — F419 Anxiety disorder, unspecified: Secondary | ICD-10-CM

## 2021-11-28 DIAGNOSIS — I5033 Acute on chronic diastolic (congestive) heart failure: Secondary | ICD-10-CM

## 2021-11-28 DIAGNOSIS — K5909 Other constipation: Secondary | ICD-10-CM

## 2021-11-28 DIAGNOSIS — R197 Diarrhea, unspecified: Secondary | ICD-10-CM

## 2021-12-01 DIAGNOSIS — J449 Chronic obstructive pulmonary disease, unspecified: Secondary | ICD-10-CM | POA: Diagnosis not present

## 2021-12-04 DIAGNOSIS — I495 Sick sinus syndrome: Secondary | ICD-10-CM | POA: Diagnosis not present

## 2021-12-04 DIAGNOSIS — T8203XD Leakage of heart valve prosthesis, subsequent encounter: Secondary | ICD-10-CM | POA: Diagnosis not present

## 2021-12-04 DIAGNOSIS — E785 Hyperlipidemia, unspecified: Secondary | ICD-10-CM | POA: Diagnosis not present

## 2021-12-04 DIAGNOSIS — E782 Mixed hyperlipidemia: Secondary | ICD-10-CM | POA: Diagnosis not present

## 2021-12-04 DIAGNOSIS — I4891 Unspecified atrial fibrillation: Secondary | ICD-10-CM | POA: Diagnosis not present

## 2021-12-04 DIAGNOSIS — Z9049 Acquired absence of other specified parts of digestive tract: Secondary | ICD-10-CM | POA: Diagnosis not present

## 2021-12-04 DIAGNOSIS — I5020 Unspecified systolic (congestive) heart failure: Secondary | ICD-10-CM | POA: Diagnosis not present

## 2021-12-04 DIAGNOSIS — I498 Other specified cardiac arrhythmias: Secondary | ICD-10-CM | POA: Diagnosis not present

## 2021-12-04 DIAGNOSIS — I259 Chronic ischemic heart disease, unspecified: Secondary | ICD-10-CM | POA: Diagnosis not present

## 2021-12-04 DIAGNOSIS — R0609 Other forms of dyspnea: Secondary | ICD-10-CM | POA: Diagnosis not present

## 2021-12-04 DIAGNOSIS — I5031 Acute diastolic (congestive) heart failure: Secondary | ICD-10-CM | POA: Diagnosis not present

## 2021-12-04 DIAGNOSIS — Z79899 Other long term (current) drug therapy: Secondary | ICD-10-CM | POA: Diagnosis not present

## 2021-12-04 DIAGNOSIS — Z87891 Personal history of nicotine dependence: Secondary | ICD-10-CM | POA: Diagnosis not present

## 2021-12-04 DIAGNOSIS — I480 Paroxysmal atrial fibrillation: Secondary | ICD-10-CM | POA: Diagnosis not present

## 2021-12-04 DIAGNOSIS — Z952 Presence of prosthetic heart valve: Secondary | ICD-10-CM | POA: Diagnosis not present

## 2021-12-04 DIAGNOSIS — J9611 Chronic respiratory failure with hypoxia: Secondary | ICD-10-CM | POA: Diagnosis not present

## 2021-12-04 DIAGNOSIS — I2510 Atherosclerotic heart disease of native coronary artery without angina pectoris: Secondary | ICD-10-CM | POA: Diagnosis not present

## 2021-12-04 DIAGNOSIS — I352 Nonrheumatic aortic (valve) stenosis with insufficiency: Secondary | ICD-10-CM | POA: Diagnosis not present

## 2021-12-04 DIAGNOSIS — Z950 Presence of cardiac pacemaker: Secondary | ICD-10-CM | POA: Diagnosis not present

## 2021-12-06 DIAGNOSIS — I5031 Acute diastolic (congestive) heart failure: Secondary | ICD-10-CM | POA: Diagnosis not present

## 2021-12-08 DIAGNOSIS — E1122 Type 2 diabetes mellitus with diabetic chronic kidney disease: Secondary | ICD-10-CM | POA: Diagnosis not present

## 2021-12-08 DIAGNOSIS — N1831 Chronic kidney disease, stage 3a: Secondary | ICD-10-CM | POA: Diagnosis not present

## 2021-12-08 DIAGNOSIS — I219 Acute myocardial infarction, unspecified: Secondary | ICD-10-CM | POA: Diagnosis not present

## 2021-12-08 DIAGNOSIS — J449 Chronic obstructive pulmonary disease, unspecified: Secondary | ICD-10-CM | POA: Diagnosis not present

## 2021-12-26 ENCOUNTER — Other Ambulatory Visit: Payer: Self-pay | Admitting: Family Medicine

## 2021-12-26 DIAGNOSIS — R197 Diarrhea, unspecified: Secondary | ICD-10-CM

## 2022-01-01 DIAGNOSIS — J449 Chronic obstructive pulmonary disease, unspecified: Secondary | ICD-10-CM | POA: Diagnosis not present

## 2022-01-08 DIAGNOSIS — N1831 Chronic kidney disease, stage 3a: Secondary | ICD-10-CM | POA: Diagnosis not present

## 2022-01-08 DIAGNOSIS — E1122 Type 2 diabetes mellitus with diabetic chronic kidney disease: Secondary | ICD-10-CM | POA: Diagnosis not present

## 2022-01-08 DIAGNOSIS — J449 Chronic obstructive pulmonary disease, unspecified: Secondary | ICD-10-CM | POA: Diagnosis not present

## 2022-01-08 DIAGNOSIS — I219 Acute myocardial infarction, unspecified: Secondary | ICD-10-CM | POA: Diagnosis not present

## 2022-01-23 ENCOUNTER — Other Ambulatory Visit: Payer: Self-pay | Admitting: Family Medicine

## 2022-01-23 ENCOUNTER — Other Ambulatory Visit: Payer: Self-pay | Admitting: Psychiatry

## 2022-01-23 DIAGNOSIS — R197 Diarrhea, unspecified: Secondary | ICD-10-CM

## 2022-01-23 DIAGNOSIS — D500 Iron deficiency anemia secondary to blood loss (chronic): Secondary | ICD-10-CM

## 2022-01-23 DIAGNOSIS — I5033 Acute on chronic diastolic (congestive) heart failure: Secondary | ICD-10-CM

## 2022-01-23 DIAGNOSIS — F5101 Primary insomnia: Secondary | ICD-10-CM

## 2022-01-23 DIAGNOSIS — N1831 Chronic kidney disease, stage 3a: Secondary | ICD-10-CM

## 2022-01-23 DIAGNOSIS — K5909 Other constipation: Secondary | ICD-10-CM

## 2022-01-23 DIAGNOSIS — J449 Chronic obstructive pulmonary disease, unspecified: Secondary | ICD-10-CM

## 2022-01-23 DIAGNOSIS — G309 Alzheimer's disease, unspecified: Secondary | ICD-10-CM

## 2022-01-23 DIAGNOSIS — E1122 Type 2 diabetes mellitus with diabetic chronic kidney disease: Secondary | ICD-10-CM

## 2022-01-24 ENCOUNTER — Other Ambulatory Visit: Payer: Self-pay | Admitting: Family Medicine

## 2022-01-24 DIAGNOSIS — M545 Low back pain, unspecified: Secondary | ICD-10-CM

## 2022-01-31 DIAGNOSIS — J449 Chronic obstructive pulmonary disease, unspecified: Secondary | ICD-10-CM | POA: Diagnosis not present

## 2022-02-04 ENCOUNTER — Ambulatory Visit: Payer: Medicare Other | Admitting: Psychiatry

## 2022-02-06 ENCOUNTER — Ambulatory Visit (INDEPENDENT_AMBULATORY_CARE_PROVIDER_SITE_OTHER): Payer: Medicare Other | Admitting: Family Medicine

## 2022-02-06 ENCOUNTER — Ambulatory Visit (INDEPENDENT_AMBULATORY_CARE_PROVIDER_SITE_OTHER): Payer: Medicare Other

## 2022-02-06 ENCOUNTER — Encounter: Payer: Self-pay | Admitting: Family Medicine

## 2022-02-06 VITALS — BP 105/57 | HR 79 | Temp 97.9°F | Ht 68.0 in | Wt 211.2 lb

## 2022-02-06 DIAGNOSIS — I5032 Chronic diastolic (congestive) heart failure: Secondary | ICD-10-CM

## 2022-02-06 DIAGNOSIS — R600 Localized edema: Secondary | ICD-10-CM

## 2022-02-06 DIAGNOSIS — R051 Acute cough: Secondary | ICD-10-CM

## 2022-02-06 DIAGNOSIS — G309 Alzheimer's disease, unspecified: Secondary | ICD-10-CM

## 2022-02-06 DIAGNOSIS — E1122 Type 2 diabetes mellitus with diabetic chronic kidney disease: Secondary | ICD-10-CM

## 2022-02-06 DIAGNOSIS — E785 Hyperlipidemia, unspecified: Secondary | ICD-10-CM | POA: Diagnosis not present

## 2022-02-06 DIAGNOSIS — E039 Hypothyroidism, unspecified: Secondary | ICD-10-CM | POA: Diagnosis not present

## 2022-02-06 DIAGNOSIS — E1159 Type 2 diabetes mellitus with other circulatory complications: Secondary | ICD-10-CM

## 2022-02-06 DIAGNOSIS — E1169 Type 2 diabetes mellitus with other specified complication: Secondary | ICD-10-CM

## 2022-02-06 DIAGNOSIS — I272 Pulmonary hypertension, unspecified: Secondary | ICD-10-CM | POA: Diagnosis not present

## 2022-02-06 DIAGNOSIS — Z7409 Other reduced mobility: Secondary | ICD-10-CM

## 2022-02-06 DIAGNOSIS — I152 Hypertension secondary to endocrine disorders: Secondary | ICD-10-CM

## 2022-02-06 DIAGNOSIS — R296 Repeated falls: Secondary | ICD-10-CM | POA: Diagnosis not present

## 2022-02-06 DIAGNOSIS — N1831 Chronic kidney disease, stage 3a: Secondary | ICD-10-CM | POA: Diagnosis not present

## 2022-02-06 DIAGNOSIS — F02818 Dementia in other diseases classified elsewhere, unspecified severity, with other behavioral disturbance: Secondary | ICD-10-CM | POA: Diagnosis not present

## 2022-02-06 DIAGNOSIS — Z789 Other specified health status: Secondary | ICD-10-CM

## 2022-02-06 DIAGNOSIS — I10 Essential (primary) hypertension: Secondary | ICD-10-CM | POA: Diagnosis not present

## 2022-02-06 DIAGNOSIS — R059 Cough, unspecified: Secondary | ICD-10-CM | POA: Diagnosis not present

## 2022-02-06 LAB — BAYER DCA HB A1C WAIVED: HB A1C (BAYER DCA - WAIVED): 8.8 % — ABNORMAL HIGH (ref 4.8–5.6)

## 2022-02-06 MED ORDER — BENZONATATE 100 MG PO CAPS: 100.0000 mg | ORAL_CAPSULE | Freq: Three times a day (TID) | ORAL | 0 refills | Status: DC | PRN

## 2022-02-06 NOTE — Progress Notes (Signed)
Subjective: CC: Chronic follow-up PCP: [name], DO GHW:EXHB D Lambertson is a 81 y.o. male presenting to clinic today for:  1. Type 2 Diabetes with hypertension, hyperlipidemia associated with CKD and CHF:  Patient does not have a great appetite but wife was not surprised that his A1c was out of range today.  He reports some mild cough over the last 2 days, fluid retention in the lower extremities.  Compliant with O2 but did not bring it in today  2.  Alzheimer's disease with mood disorder He continues have difficulty with sleep.  Trazodone has not been helpful.  Treated with mirtazapine 15 g nightly as well as Aricept 10 mg daily.  Is seen by neurology but no recent medication adjustments made.  He has had history of adverse side effects with meds like Ambien.  She notes that he is moody and quite difficult to take care of.  Sometimes he is really not even able to get up and she has to try and lift him.  Unfortunately she suffers from degenerative changes in her back and is getting quite overwhelmed by his care needs.   Last A1c:  Lab Results  Component Value Date   HGBA1C 6.7 (H) 11/06/2021   Nephropathy screen indicated?: has CKD Last flu, zoster and/or pneumovax:  Immunization History  Administered Date(s) Administered   Fluad Quad(high Dose 65+) 02/29/2016, 01/20/2017, 02/10/2018, 02/04/2020, 01/15/2021   Influenza, High Dose Seasonal PF 02/29/2016, 01/20/2017, 02/10/2018   Influenza, Seasonal, Injecte, Preservative Fre 02/03/2014   Influenza,inj,Quad PF,6+ Mos 01/01/2019   Influenza-Unspecified 02/03/2014, 02/29/2016, 01/20/2017, 01/20/2017, 02/10/2018, 02/10/2018   Moderna Sars-Covid-2 Vaccination 07/05/2019, 08/02/2019, 02/23/2020   PNEUMOCOCCAL CONJUGATE-20 01/15/2021   Pneumococcal Conjugate-13 11/25/2014, 11/25/2014   Pneumococcal Polysaccharide-23 05/30/2016, 05/30/2016, 01/01/2019   Pneumococcal-Unspecified 05/30/2016   Tdap 11/25/2014, 11/25/2014      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   Abdominal aortic aneurysm without rupture (HCC)    Anemia    Anxiety and depression    Aortic stenosis    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HCC)    CHF (congestive heart failure) (HCC)    Cholecystitis    Chronic ischemic heart disease    Complication of anesthesia    hard to be put to sleep   COPD (chronic obstructive pulmonary disease) ([place])    Coronary artery disease    Diabetes mellitus without complication ([place])    Diabetes mellitus without complication ([place])    takes Januvia and Metformin daily   GERD (gastroesophageal reflux disease)    GERD (gastroesophageal reflux disease)    takes omeprazole daily   Headache(784.0)    sinus   History of bronchitis    last time >3yr ago   Hyperlipidemia    takes Lipitor daily   Hypertension    Hypertension    takes Metoprolol daily   Infected sebaceous cyst 01/30/2021   Joint pain    Myocardial infarction ([facility]    x 3;last one about 3-453yrago   Neoplasm of uncertain behavior of bladder    Obesity    OSA (obstructive sleep apnea)    Pacemaker    Pancytopenia (HCC)    Paroxysmal atrial fibrillation ([facility]   Pneumonia    last itme about [id number]  S/P TAVR (transcatheter aortic valve replacement)    Severe aortic stenosis    Sinus bradycardia     Current Outpatient Medications:    acetaZOLAMIDE (DIAMOX) 250 MG tablet, Take by mouth., Disp: , Rfl:    albuterol (PROVENTIL) (2.5 MG/3ML) 0.083% nebulizer solution, Take 2.5 mg by nebulization every 6 (six) hours as needed for wheezing or shortness of breath., Disp: , Rfl:    albuterol (VENTOLIN HFA) 108 (90 Base) MCG/ACT inhaler, INHALE 2 PUFFS INTO THE LUNGS EVERY 6 HOURS AS NEEDED FOR WHEEZING OR SHORTNESS OF BREATH, Disp: 8.5 g, Rfl:  2   aspirin EC 81 MG tablet, Take 81 mg by mouth daily., Disp: , Rfl:    azelastine (ASTELIN) 0.1 % nasal spray, Place 1 spray into both nostrils 2 (two) times daily. For runny nose, Disp: 30 mL, Rfl: 12   Blood Glucose Monitoring Suppl (BLOOD GLUCOSE MONITOR SYSTEM) w/Device KIT, 1 Device by Does not apply route 2 (two) times daily. Dx E11.9, Disp: 1 kit, Rfl: 0   calcium carbonate (OSCAL) 1500 (600 Ca) MG TABS tablet, Take by mouth 2 (two) times daily with a meal., Disp: , Rfl:    clopidogrel (PLAVIX) 75 MG tablet, TAKE ONE (1) TABLET EACH DAY, Disp: 90 tablet, Rfl: 0   clotrimazole-betamethasone (LOTRISONE) cream, APPLY TWICE DAILY TO TORSO FOR UP TO 14 DAYS, Disp: 45 g, Rfl: 1   dapagliflozin propanediol (FARXIGA) 10 MG TABS tablet, TAKE ONE TABLET EACH MORNING BEFORE BREAKFAST, Disp: 90 tablet, Rfl: 0   dextromethorphan-guaiFENesin (MUCINEX DM) 30-600 MG 12hr tablet, Take 1 tablet by mouth 2 (two) times daily. (Patient not taking: Reported on 07/27/2021), Disp: 30 tablet, Rfl: 0   donepezil (ARICEPT) 5 MG tablet, TAKE TWO TABLETS BY MOUTH DAILY, Disp: 60 tablet, Rfl: 2   ferrous sulfate 325 (65 FE) MG EC tablet, Take 325 mg by mouth every Monday, Wednesday, and Friday. , Disp: , Rfl:    glipiZIDE (GLUCOTROL) 5 MG tablet, Take 1 tablet (5 mg total) by mouth 2 (two) times daily before a meal., Disp: 180 tablet, Rfl: 3   glucose blood (ONETOUCH VERIO) test strip, Check BS BID Dx E11.9, Disp: 200 each, Rfl: 3   ipratropium (ATROVENT) 0.03 % nasal spray, Place 2 sprays into both nostrils every 12 (twelve) hours for 3 days., Disp: 30 mL, Rfl: 0   Lancet Device MISC, Use to check BS BID Dx E11.9, Disp: 100 each, Rfl: 3   Lancets (ONETOUCH DELICA PLUS [id number]) MISC, Check BS BID Dx E11.9, Disp: 200 each, Rfl: 3   levothyroxine (SYNTHROID) 100 MCG tablet, TAKE ONE (1) TABLET EACH DAY, Disp: 90 tablet, Rfl: 3   lidocaine (LIDODERM) 5 %, APPLY 1 PATCH AND LEAVE ON FOR 12 HOURS THEN OFF FOR 12 HOURS  (Patient not taking: Reported on 07/27/2021), Disp: 30 patch, Rfl: 12   loperamide (IMODIUM) 2 MG capsule, TAKE 2 CAPSULES BY MOUTH AS NEEDED FOR DIARRHEA/LOOSE STOOL. FOLLOWED BY 1 CAPSULE AFTER EACH LOOSE STOOL (MAX OF 8 CAPSULES DAILY), Disp: 30 capsule, Rfl: 0   lubiprostone (AMITIZA) 24 MCG capsule, TAKE 1 CAPSULE TWICE A DAY WITH MEALS., Disp: 60 capsule, Rfl: 1   methocarbamol (ROBAXIN) 500 MG tablet, TAKE 1/2 TO 1 TABLET BY MOUTH TWICE DAILY AS NEEDED FOR MUSCLE SPASMS, Disp: 30 tablet, Rfl: 0   mirtazapine (REMERON) 15 MG tablet, TAKE ONE TABLET BY MOUTH AT BEDTIME, Disp: 90 tablet, Rfl: 0   Multiple Vitamin (MULTIVITAMIN WITH MINERALS) TABS, Take 1 tablet by mouth  daily., Disp: , Rfl:    nitroGLYCERIN (NITROSTAT) 0.4 MG SL tablet, DISSOLVE 1 TAB UNDER TOUNGE FOR CHEST PAIN. MAY REPEAT EVERY 5 MINUTES FOR 3 DOSES. IF NO RELIEF CALL 911 OR GO TO ER, Disp: 25 tablet, Rfl: 2   ondansetron (ZOFRAN-ODT) 4 MG disintegrating tablet, TAKE 1 TABLET BY MOUTH EVERY 8 HOURS AS NEEDED FOR NAUSEA & VOMITING, Disp: 30 tablet, Rfl: 1   pantoprazole (PROTONIX) 40 MG tablet, TAKE ONE (1) TABLET EACH DAY, Disp: 90 tablet, Rfl: 0   rosuvastatin (CRESTOR) 10 MG tablet, TAKE ONE (1) TABLET EACH DAY, Disp: 90 tablet, Rfl: 0   Semaglutide (RYBELSUS) 7 MG TABS, TAKE ONE CAPSULE BY MOUTH DAILY, Disp: 90 tablet, Rfl: 0   sodium chloride (OCEAN) 0.65 % nasal spray, Place into the nose., Disp: , Rfl:    spironolactone (ALDACTONE) 25 MG tablet, Take 25 mg by mouth daily. , Disp: , Rfl:    SYMBICORT 80-4.5 MCG/ACT inhaler, Inhale 2 puffs into the lungs 2 (two) times daily as needed., Disp: 10.2 g, Rfl: 12   torsemide (DEMADEX) 20 MG tablet, TAKE TWO TABLETS BY MOUTH TWICE DAILY, Disp: 120 tablet, Rfl: 1   traZODone (DESYREL) 50 MG tablet, TAKE 1/2 TO 1 TABLET AT BEDTIME AS NEEDED FOR SLEEP, Disp: 90 tablet, Rfl: 0 Social History   Socioeconomic History   Marital status: Married    Spouse name: Not on file   Number of  children: Not on file   Years of education: Not on file   Highest education level: Not on file  Occupational History   Occupation: retired  Tobacco Use   Smoking status: Former   Smokeless tobacco: Never   Tobacco comments:    quit smoking 20+yrs ago  Scientific laboratory technician Use: Never used  Substance and Sexual Activity   Alcohol use: No    Comment: last drank about 2-3 years ago   Drug use: No   Sexual activity: Yes  Other Topics Concern   Not on file  Social History Narrative   Lives with his wife - their grandson lives with them at times       Social Determinants of Health   Financial Resource Strain: Low Risk  (07/26/2020)   Overall Financial Resource Strain (CARDIA)    Difficulty of Paying Living Expenses: Not very hard  Food Insecurity: No Food Insecurity (07/26/2020)   Hunger Vital Sign    Worried About Running Out of Food in the Last Year: Never true    Ran Out of Food in the Last Year: Never true  Transportation Needs: No Transportation Needs (07/26/2020)   PRAPARE - Hydrologist (Medical): No    Lack of Transportation (Non-Medical): No  Physical Activity: Inactive (09/17/2021)   Exercise Vital Sign    Days of Exercise per Week: 0 days    Minutes of Exercise per Session: 0 min  Stress: Stress Concern Present (09/17/2021)   [place]    Feeling of Stress : To some extent  Social Connections: Moderately Isolated (07/26/2020)   Social Connection and Isolation Panel [NHANES]    Frequency of Communication with Friends and Family: Twice a week    Frequency of Social Gatherings with Friends and Family: Twice a week    Attends Religious Services: Never    Marine scientist or Organizations: No    Attends Archivist Meetings: Never    Marital Status: Married  Intimate Partner Violence: Not At Risk (07/26/2020)   Humiliation, Afraid, Rape, and Kick questionnaire     Fear of Current or Ex-Partner: No    Emotionally Abused: No    Physically Abused: No    Sexually Abused: No   Family History  Problem Relation Age of Onset   Cancer Mother        Unknown type   Heart disease Father    Diabetes Father    Heart disease Sister    Seizures Brother    Diabetes Daughter    Diabetes Daughter    Heart attack Son    Colon cancer Mother        in her 81s.    Stomach cancer Neg Hx    Esophageal cancer Neg Hx     Objective: Office vital signs reviewed. BP (!) 105/57   Pulse 79   Temp 97.9 F (36.6 C) (Temporal)   Ht _0  (1.727 m)   Wt 211 lb 3.2 oz (95.8 kg)   SpO2 (!) 74%   BMI 32.11 kg/m   Physical Examination:  General: Awake, alert, chronically ill-appearing male, No acute distress HEENT: Sclera white. Cardio: regular rate and rhythm, S1S2 heard, no murmurs appreciated Pulm: Mild rales appreciated in the bases of the lungs but no wheezes, rhonchi.  He has normal work of breathing on 2 L via nasal cannula Extremities: warm, well perfused, 1-2+ pitting edema to knees bilaterally, no cyanosis or clubbing; +2 pulses bilaterally MSK: Arrives in Psych: Very jovial, playful.  Does not appear to be responding to internal stimuli  DG Chest 2 View  Result Date: 02/06/2022 CLINICAL DATA:  Cough. Fluid retention. History of congestive heart failure. EXAM: CHEST - 2 VIEW COMPARISON:  05/24/2021 FINDINGS: Chronic cardiomegaly. Previous TAVR. Aortic atherosclerosis. Lead less pacemaker in place. Pulmonary venous hypertension, with minimal interstitial edema. No measurable effusion. No focal finding otherwise. IMPRESSION: Cardiomegaly. Pulmonary venous hypertension and minimal interstitial edema. No measurable effusion. Electronically Signed   By: Nelson Chimes M.D.   On: 02/06/2022 12:01     Assessment/ Plan: 81 y.o. male   Acute cough - Plan: DG Chest 2 View, benzonatate (TESSALON PERLES) 100 MG capsule  Chronic diastolic heart failure ([place]) - Plan:  DG Chest 2 View  Bilateral leg edema  Alzheimer's dementia with behavioral disturbance ([place])  Type 2 diabetes mellitus with stage 3a chronic kidney disease, without long-term current use of insulin ([place]) - Plan: Renal function panel, Bayer DCA Hb A1c Waived, Vitamin D, 25-hydroxy, Microalbumin / creatinine urine ratio  Hypertension associated with diabetes ([place]) - Plan: Renal function panel  Hyperlipidemia associated with type 2 diabetes mellitus ([place])  Acquired hypothyroidism - Plan: TSH, T4, free  Impaired mobility and ADLs  Check renal function panel, particularly given increased doses of torsemide.  We reached out to the cardiologist in efforts to discuss his current diuresis regimen.  I reviewed the last note which indicated he should be on 20 mg daily.  Again currently taking 40 mg twice daily but not really having any significant fluid reduction according to his wife.  I obtained a chest x-ray to further evaluate for any evidence of fluid overload within the lungs.  This showed mild pulmonary congestion but no significant effusion.  Tessalon Perles were given for his cough which is likely viral in nature.  Discussed elevation of lower extremities, avoidance of excess salt.  He understands red flag signs and symptoms warranting further evaluation in the ER  Check thyroid levels.  We will reach out to neurology with regards to his Alzheimer's dementia with behavioral disturbance.  He is currently treated with mirtazapine.  Trazodone discontinued due to ineffectiveness.  Also on Aricept but I wonder if he might benefit from Namenda or Exelon patches due to mood disturbance.  Also discussed potential for atypical antipsychotic but we did discuss that there is a cardiac risk with that medication.  On exam he is very jovial and playful but his wife insists that he has max score on both PHQ and GAD-7.  Though these were declined to be completed today.  Diabetes was not at goal today with A1c  rising over 8 now.  Unfortunately much of her appointment was spent on the above issues.  I will contact him once I have more information from his cardiologist and neurologist with medication changes.  We discussed his very complex medical needs and I tried to have a discussion about goals of care, particularly as he has advancing dementia.  It sounds like his wife is unlikely going to be able to take care of him long-term due to her own medical issues.  We discussed consideration for placement and or involvement of palliative/hospice care but he was not amenable to any of these options.  Orders Placed This Encounter  Procedures   TSH   Renal function panel   Bayer DCA Hb A1c Waived   T4, free   Vitamin D, 25-hydroxy   Microalbumin / creatinine urine ratio   No orders of the defined types were placed in this encounter.    [name], DO [place] [phone]

## 2022-02-06 NOTE — Patient Instructions (Addendum)
At last heart visit in August, they recommended OFF Aspirin and Clopidogrel and ON Eliquis.

## 2022-02-07 ENCOUNTER — Telehealth: Payer: Self-pay | Admitting: Family Medicine

## 2022-02-07 DIAGNOSIS — E1122 Type 2 diabetes mellitus with diabetic chronic kidney disease: Secondary | ICD-10-CM | POA: Diagnosis not present

## 2022-02-07 DIAGNOSIS — I219 Acute myocardial infarction, unspecified: Secondary | ICD-10-CM | POA: Diagnosis not present

## 2022-02-07 DIAGNOSIS — J449 Chronic obstructive pulmonary disease, unspecified: Secondary | ICD-10-CM | POA: Diagnosis not present

## 2022-02-07 DIAGNOSIS — N1831 Chronic kidney disease, stage 3a: Secondary | ICD-10-CM | POA: Diagnosis not present

## 2022-02-07 LAB — RENAL FUNCTION PANEL
Albumin: 4 g/dL (ref 3.7–4.7)
BUN/Creatinine Ratio: 15 (ref 10–24)
BUN: 27 mg/dL (ref 8–27)
CO2: 26 mmol/L (ref 20–29)
Calcium: 9.2 mg/dL (ref 8.6–10.2)
Chloride: 91 mmol/L — ABNORMAL LOW (ref 96–106)
Creatinine, Ser: 1.81 mg/dL — ABNORMAL HIGH (ref 0.76–1.27)
Glucose: 269 mg/dL — ABNORMAL HIGH (ref 70–99)
Phosphorus: 3.8 mg/dL (ref 2.8–4.1)
Potassium: 4.5 mmol/L (ref 3.5–5.2)
Sodium: 133 mmol/L — ABNORMAL LOW (ref 134–144)
eGFR: 37 mL/min/{1.73_m2} — ABNORMAL LOW (ref >59)

## 2022-02-07 LAB — VITAMIN D 25 HYDROXY (VIT D DEFICIENCY, FRACTURES): Vit D, 25-Hydroxy: 38.7 ng/mL (ref 30.0–100.0)

## 2022-02-07 LAB — T4, FREE: Free T4: 1.7 ng/dL (ref 0.82–1.77)

## 2022-02-07 LAB — TSH: TSH: 1.68 u[IU]/mL (ref 0.450–4.500)

## 2022-02-08 LAB — MICROALBUMIN / CREATININE URINE RATIO
Creatinine, Urine: 20.4 mg/dL
Microalb/Creat Ratio: 72 mg/g creat — ABNORMAL HIGH (ref 0–29)
Microalbumin, Urine: 14.7 ug/mL

## 2022-02-08 NOTE — Telephone Encounter (Signed)
Pt advised of Dr. Marjean Donna recommendations. Pt does not want to go to [facility] or [place]. States that it does not do any good. Advised that [facility] ER is a great option especially if it is for cardiac concerns. Pt states that the wait is too long there. Pt states that he is going to [employer]. Will route message to [facility] to make aware.

## 2022-02-08 NOTE — Telephone Encounter (Signed)
Spouse aware.

## 2022-02-08 NOTE — Telephone Encounter (Signed)
If breathing is worse and feet are swelling.  Would definitely go to ER!

## 2022-02-08 NOTE — Telephone Encounter (Signed)
I left a message with [facility] cardiologist to call me back and never heard back. HIGHLY recommend they call as well as we discussed on Wednesday.  The neurologist was sent his note as well but I haven't heard back from them just yet either.

## 2022-02-08 NOTE — Telephone Encounter (Signed)
Patient called to let us know he still feels like he cannot breathe normally and his feet are swelling.  I told him you had reached out to his cardiologist at [facility] but still not  heard anything.  I advised him to contact them also like your note said, but he wanted to let you know what is going on.  I offered him an appointment this afternoon but he said he cannot come in because his wife cannot bring him today.

## 2022-02-09 DIAGNOSIS — I482 Chronic atrial fibrillation, unspecified: Secondary | ICD-10-CM | POA: Diagnosis not present

## 2022-02-09 DIAGNOSIS — I255 Ischemic cardiomyopathy: Secondary | ICD-10-CM | POA: Diagnosis not present

## 2022-02-09 DIAGNOSIS — Z7901 Long term (current) use of anticoagulants: Secondary | ICD-10-CM | POA: Diagnosis not present

## 2022-02-09 DIAGNOSIS — I130 Hypertensive heart and chronic kidney disease with heart failure and stage 1 through stage 4 chronic kidney disease, or unspecified chronic kidney disease: Secondary | ICD-10-CM | POA: Diagnosis not present

## 2022-02-09 DIAGNOSIS — I493 Ventricular premature depolarization: Secondary | ICD-10-CM | POA: Diagnosis not present

## 2022-02-09 DIAGNOSIS — E785 Hyperlipidemia, unspecified: Secondary | ICD-10-CM | POA: Diagnosis not present

## 2022-02-09 DIAGNOSIS — I129 Hypertensive chronic kidney disease with stage 1 through stage 4 chronic kidney disease, or unspecified chronic kidney disease: Secondary | ICD-10-CM | POA: Diagnosis not present

## 2022-02-09 DIAGNOSIS — Z955 Presence of coronary angioplasty implant and graft: Secondary | ICD-10-CM | POA: Diagnosis not present

## 2022-02-09 DIAGNOSIS — N183 Chronic kidney disease, stage 3 unspecified: Secondary | ICD-10-CM | POA: Diagnosis not present

## 2022-02-09 DIAGNOSIS — J449 Chronic obstructive pulmonary disease, unspecified: Secondary | ICD-10-CM | POA: Diagnosis not present

## 2022-02-09 DIAGNOSIS — I6529 Occlusion and stenosis of unspecified carotid artery: Secondary | ICD-10-CM | POA: Diagnosis not present

## 2022-02-09 DIAGNOSIS — I5020 Unspecified systolic (congestive) heart failure: Secondary | ICD-10-CM | POA: Diagnosis not present

## 2022-02-09 DIAGNOSIS — Z7984 Long term (current) use of oral hypoglycemic drugs: Secondary | ICD-10-CM | POA: Diagnosis not present

## 2022-02-09 DIAGNOSIS — E1122 Type 2 diabetes mellitus with diabetic chronic kidney disease: Secondary | ICD-10-CM | POA: Diagnosis not present

## 2022-02-09 DIAGNOSIS — K219 Gastro-esophageal reflux disease without esophagitis: Secondary | ICD-10-CM | POA: Diagnosis not present

## 2022-02-09 DIAGNOSIS — G4733 Obstructive sleep apnea (adult) (pediatric): Secondary | ICD-10-CM | POA: Diagnosis not present

## 2022-02-09 DIAGNOSIS — Z8249 Family history of ischemic heart disease and other diseases of the circulatory system: Secondary | ICD-10-CM | POA: Diagnosis not present

## 2022-02-09 DIAGNOSIS — R0902 Hypoxemia: Secondary | ICD-10-CM | POA: Diagnosis not present

## 2022-02-09 DIAGNOSIS — Z7951 Long term (current) use of inhaled steroids: Secondary | ICD-10-CM | POA: Diagnosis not present

## 2022-02-09 DIAGNOSIS — R0600 Dyspnea, unspecified: Secondary | ICD-10-CM | POA: Diagnosis not present

## 2022-02-09 DIAGNOSIS — N179 Acute kidney failure, unspecified: Secondary | ICD-10-CM | POA: Diagnosis not present

## 2022-02-09 DIAGNOSIS — Z79899 Other long term (current) drug therapy: Secondary | ICD-10-CM | POA: Diagnosis not present

## 2022-02-09 DIAGNOSIS — I5031 Acute diastolic (congestive) heart failure: Secondary | ICD-10-CM | POA: Diagnosis not present

## 2022-02-09 DIAGNOSIS — I2510 Atherosclerotic heart disease of native coronary artery without angina pectoris: Secondary | ICD-10-CM | POA: Diagnosis not present

## 2022-02-09 DIAGNOSIS — E7800 Pure hypercholesterolemia, unspecified: Secondary | ICD-10-CM | POA: Diagnosis not present

## 2022-02-09 DIAGNOSIS — Z87891 Personal history of nicotine dependence: Secondary | ICD-10-CM | POA: Diagnosis not present

## 2022-02-09 DIAGNOSIS — I5023 Acute on chronic systolic (congestive) heart failure: Secondary | ICD-10-CM | POA: Diagnosis not present

## 2022-02-09 DIAGNOSIS — Z952 Presence of prosthetic heart valve: Secondary | ICD-10-CM | POA: Diagnosis not present

## 2022-02-09 DIAGNOSIS — I4891 Unspecified atrial fibrillation: Secondary | ICD-10-CM | POA: Diagnosis not present

## 2022-02-09 DIAGNOSIS — I428 Other cardiomyopathies: Secondary | ICD-10-CM | POA: Diagnosis not present

## 2022-02-09 DIAGNOSIS — R0609 Other forms of dyspnea: Secondary | ICD-10-CM | POA: Diagnosis not present

## 2022-02-09 DIAGNOSIS — Z950 Presence of cardiac pacemaker: Secondary | ICD-10-CM | POA: Diagnosis not present

## 2022-02-09 DIAGNOSIS — E039 Hypothyroidism, unspecified: Secondary | ICD-10-CM | POA: Diagnosis not present

## 2022-02-09 DIAGNOSIS — I495 Sick sinus syndrome: Secondary | ICD-10-CM | POA: Diagnosis not present

## 2022-02-09 DIAGNOSIS — E119 Type 2 diabetes mellitus without complications: Secondary | ICD-10-CM | POA: Diagnosis not present

## 2022-02-09 DIAGNOSIS — D509 Iron deficiency anemia, unspecified: Secondary | ICD-10-CM | POA: Diagnosis not present

## 2022-02-09 DIAGNOSIS — Z9861 Coronary angioplasty status: Secondary | ICD-10-CM | POA: Diagnosis not present

## 2022-02-09 DIAGNOSIS — R0602 Shortness of breath: Secondary | ICD-10-CM | POA: Diagnosis not present

## 2022-02-09 DIAGNOSIS — I110 Hypertensive heart disease with heart failure: Secondary | ICD-10-CM | POA: Diagnosis not present

## 2022-02-09 DIAGNOSIS — Z951 Presence of aortocoronary bypass graft: Secondary | ICD-10-CM | POA: Diagnosis not present

## 2022-02-10 DIAGNOSIS — R0602 Shortness of breath: Secondary | ICD-10-CM | POA: Diagnosis not present

## 2022-02-10 DIAGNOSIS — N179 Acute kidney failure, unspecified: Secondary | ICD-10-CM | POA: Diagnosis not present

## 2022-02-10 DIAGNOSIS — N183 Chronic kidney disease, stage 3 unspecified: Secondary | ICD-10-CM | POA: Diagnosis not present

## 2022-02-10 DIAGNOSIS — I255 Ischemic cardiomyopathy: Secondary | ICD-10-CM | POA: Diagnosis not present

## 2022-02-10 DIAGNOSIS — I5023 Acute on chronic systolic (congestive) heart failure: Secondary | ICD-10-CM | POA: Diagnosis not present

## 2022-02-20 ENCOUNTER — Other Ambulatory Visit: Payer: Self-pay | Admitting: Nurse Practitioner

## 2022-02-20 ENCOUNTER — Ambulatory Visit (INDEPENDENT_AMBULATORY_CARE_PROVIDER_SITE_OTHER): Payer: Medicare Other | Admitting: Family Medicine

## 2022-02-20 ENCOUNTER — Other Ambulatory Visit: Payer: Self-pay | Admitting: Family Medicine

## 2022-02-20 ENCOUNTER — Encounter: Payer: Self-pay | Admitting: Family Medicine

## 2022-02-20 VITALS — BP 102/62 | HR 75 | Temp 97.6°F | Ht 68.0 in | Wt 192.8 lb

## 2022-02-20 DIAGNOSIS — Z9181 History of falling: Secondary | ICD-10-CM | POA: Diagnosis not present

## 2022-02-20 DIAGNOSIS — I2510 Atherosclerotic heart disease of native coronary artery without angina pectoris: Secondary | ICD-10-CM

## 2022-02-20 DIAGNOSIS — R197 Diarrhea, unspecified: Secondary | ICD-10-CM

## 2022-02-20 DIAGNOSIS — I5043 Acute on chronic combined systolic (congestive) and diastolic (congestive) heart failure: Secondary | ICD-10-CM

## 2022-02-20 DIAGNOSIS — E039 Hypothyroidism, unspecified: Secondary | ICD-10-CM

## 2022-02-20 DIAGNOSIS — J449 Chronic obstructive pulmonary disease, unspecified: Secondary | ICD-10-CM

## 2022-02-20 DIAGNOSIS — R001 Bradycardia, unspecified: Secondary | ICD-10-CM | POA: Diagnosis not present

## 2022-02-20 DIAGNOSIS — F0280 Dementia in other diseases classified elsewhere without behavioral disturbance: Secondary | ICD-10-CM

## 2022-02-20 DIAGNOSIS — M545 Low back pain, unspecified: Secondary | ICD-10-CM

## 2022-02-20 DIAGNOSIS — N1831 Chronic kidney disease, stage 3a: Secondary | ICD-10-CM

## 2022-02-20 DIAGNOSIS — G309 Alzheimer's disease, unspecified: Secondary | ICD-10-CM

## 2022-02-20 MED ORDER — METOPROLOL SUCCINATE ER 25 MG PO TB24: 12.5000 mg | ORAL_TABLET | Freq: Every day | ORAL | 0 refills | Status: DC

## 2022-02-20 NOTE — Progress Notes (Addendum)
Subjective:  Patient ID: Matthew Campos, male    DOB: [birth year]  Age: 81 y.o. MRN: [id number]  CC: Hospitalization Follow-up   HPI Matthew Campos presents for dyspnea, weakness. On O2 at home 4 liters. Was hospitalized at [facility] from 10/28 to 11/6 for CHF. " Full of fluid." Heart rate was 45 and paced. Swelling no better at DC.Glucose ran  high in the hospital. Lowest was 191.   Glucose better since home. 188 this AM   follow-up on  thyroid. The patient has a history of hypothyroidism for many years. It has been stable recently. Pt. denies any change in  voice, loss of hair, heat or cold intolerance. Energy level has been adequate to good. Patient denies constipation and diarrhea. No myxedema. Medication is as noted below. Verified that pt is taking it daily on an empty stomach. Well tolerated.      02/20/2022   11:34 AM 02/06/2022   11:18 AM 11/06/2021   10:52 AM  Depression screen PHQ 2/9  Decreased Interest _0 Down, Depressed, Hopeless _1 PHQ - 2 Score _2 Altered sleeping _3 Tired, decreased energy _4 Change in appetite _5 Feeling bad or failure about yourself  _6 Trouble concentrating _7 Moving slowly or fidgety/restless _8 Suicidal thoughts 3 3 0  PHQ-9 Score _9 Difficult doing work/chores Extremely dIfficult Extremely dIfficult Very difficult    History Matthew Campos has a past medical history of AAA (abdominal aortic aneurysm) without rupture ([place]), Abdominal aortic aneurysm without rupture ([place]), Anemia, Anxiety and depression, Aortic stenosis, 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[place]), CHF (congestive heart failure) ([place]), Cholecystitis, Chronic ischemic heart disease, Complication of anesthesia, COPD (chronic obstructive pulmonary disease) ([place]), Coronary artery disease, Diabetes mellitus without complication ([place]), Diabetes mellitus without complication ([place]), GERD (gastroesophageal reflux disease), GERD (gastroesophageal reflux disease),  Headache(784.0), History of bronchitis, Hyperlipidemia, Hypertension, Hypertension, Infected sebaceous cyst (01/30/2021), Joint pain, Myocardial infarction ([place]), Neoplasm of uncertain behavior of bladder, Obesity, OSA (obstructive sleep apnea), Pacemaker, Pancytopenia ([place]), Paroxysmal atrial fibrillation ([place]), Pneumonia, S/P TAVR (transcatheter aortic valve replacement), Severe aortic stenosis, and Sinus bradycardia.   He has a past surgical history that includes Aortic valve replacement; cholecystostomy tube; Abdominal aortic aneurysm repair; Coronary angioplasty with stent; Esophagogastroduodenoscopy (egd) with propofol (N/A, 04/15/2019); polypectomy (04/15/2019); Tonsillectomy; cyst removed from left wrist; Coronary angioplasty (2010); abdominal aneurysm stenting; Hernia repair; left shoulder surgery; Shoulder arthroscopy with rotator cuff repair and subacromial decompression (04/17/2012); and Valve replacement.   His family history includes Cancer in his mother; Colon cancer in his mother; Diabetes in his daughter, daughter, and father; Heart attack in his son; Heart disease in his father and sister; Seizures in his brother.He reports that he has quit smoking. He has never used smokeless tobacco. He reports that he does not drink alcohol and does not use drugs.    ROS Review of Systems  Constitutional:  Negative for fever.  Respiratory:  Negative for shortness of breath.   Cardiovascular:  Negative for chest pain.  Musculoskeletal:  Negative for arthralgias.  Skin:  Negative for rash.    Objective:  BP 102/62   Pulse 75   Temp 97.6 F (36.4 C)   Ht _10  (1.727 m)   Wt 192 lb 12.8 oz (87.5 kg)   SpO2 (!) 88%   BMI 29.32 kg/m   BP Readings from Last  3 Encounters:  02/20/22 102/62  02/06/22 (!) 105/57  08/06/21 106/66    Wt Readings from Last 3 Encounters:  02/20/22 192 lb 12.8 oz (87.5 kg)  02/06/22 211 lb 3.2 oz (95.8 kg)  11/06/21 200 lb 6.4 oz (90.9 kg)     Physical  Exam Vitals reviewed.  Constitutional:      Appearance: He is well-developed.  HENT:     Head: Normocephalic and atraumatic.     Right Ear: External ear normal.     Left Ear: External ear normal.     Mouth/Throat:     Pharynx: No oropharyngeal exudate or posterior oropharyngeal erythema.  Eyes:     Pupils: Pupils are equal, round, and reactive to light.  Cardiovascular:     Rate and Rhythm: Normal rate and regular rhythm.     Heart sounds: No murmur heard. Pulmonary:     Effort: No respiratory distress.     Breath sounds: Normal breath sounds.  Musculoskeletal:     Cervical back: Normal range of motion and neck supple.  Neurological:     Mental Status: He is alert and oriented to person, place, and time.       Assessment & Plan:   Rehman was seen today for hospitalization follow-up.  Diagnoses and all orders for this visit:  Acute on chronic combined systolic and diastolic congestive heart failure ([place]) -     Ambulatory referral to [place] -     Ambulatory referral to Cardiology -     CMP14+EGFR -     Brain natriuretic peptide  Chronic obstructive pulmonary disease, unspecified COPD type ([place]) -     Ambulatory referral to [place] -     Ambulatory referral to Cardiology -     CMP14+EGFR -     Brain natriuretic peptide -     CBC with Differential/Platelet  Alzheimer's dementia without behavioral disturbance ([place]) -     Ambulatory referral to [place] -     Ambulatory referral to Cardiology -     CMP14+EGFR -     Brain natriuretic peptide  Coronary artery disease involving native coronary artery of native heart without angina pectoris -     Ambulatory referral to [place] -     Ambulatory referral to Cardiology -     CMP14+EGFR -     Brain natriuretic peptide  At risk for falls -     Ambulatory referral to Cardiology -     CMP14+EGFR -     Brain natriuretic peptide  Acquired hypothyroidism -     CMP14+EGFR -     Brain natriuretic peptide -      TSH + free T4  Bradycardia  Other orders -     metoprolol succinate (TOPROL-XL) 25 MG 24 hr tablet; Take 0.5 tablets (12.5 mg total) by mouth daily.       I have discontinued Tiler D. Needham's clopidogrel. I have also changed his metoprolol succinate. Additionally, I am having him maintain his multivitamin with minerals, albuterol, acetaZOLAMIDE, spironolactone, aspirin EC, ferrous sulfate, calcium carbonate, sodium chloride, dextromethorphan-guaiFENesin, azelastine, Blood Glucose Monitor System, Lancet Device, levothyroxine, lidocaine, ipratropium, Symbicort, mirtazapine, OneTouch Verio, OneTouch Delica Plus [id number], ondansetron, lubiprostone, torsemide, clotrimazole-betamethasone, albuterol, donepezil, pantoprazole, Rybelsus, Farxiga, benzonatate, losartan, and apixaban.  Allergies as of 02/20/2022       Reactions   Codeine Nausea And Vomiting   Latex Hives, Itching   Morphine    Lisinopril Cough  Medication List        Accurate as of February 20, 2022 11:59 PM. If you have any questions, ask your nurse or doctor.          STOP taking these medications    clopidogrel 75 MG tablet Commonly known as: PLAVIX Stopped by: Claretta Fraise, MD       TAKE these medications    acetaZOLAMIDE 250 MG tablet Commonly known as: DIAMOX Take by mouth.   albuterol (2.5 MG/3ML) 0.083% nebulizer solution Commonly known as: PROVENTIL Take 2.5 mg by nebulization every 6 (six) hours as needed for wheezing or shortness of breath.   albuterol 108 (90 Base) MCG/ACT inhaler Commonly known as: VENTOLIN HFA INHALE 2 PUFFS INTO THE LUNGS EVERY 6 HOURS AS NEEDED FOR WHEEZING OR SHORTNESS OF BREATH   apixaban 2.5 MG Tabs tablet Commonly known as: ELIQUIS Take 2.5 mg by mouth 2 (two) times daily.   aspirin EC 81 MG tablet Take 81 mg by mouth daily.   azelastine 0.1 % nasal spray Commonly known as: ASTELIN Place 1 spray into both nostrils 2 (two) times daily. For runny  nose   benzonatate 100 MG capsule Commonly known as: Tessalon Perles Take 1 capsule (100 mg total) by mouth 3 (three) times daily as needed.   Blood Glucose Monitor System w/Device Kit 1 Device by Does not apply route 2 (two) times daily. Dx E11.9   calcium carbonate 1500 (600 Ca) MG Tabs tablet Commonly known as: OSCAL Take by mouth 2 (two) times daily with a meal.   clotrimazole-betamethasone cream Commonly known as: LOTRISONE APPLY TWICE DAILY TO TORSO FOR UP TO 14 DAYS   dextromethorphan-guaiFENesin 30-600 MG 12hr tablet Commonly known as: MUCINEX DM Take 1 tablet by mouth 2 (two) times daily.   donepezil 5 MG tablet Commonly known as: ARICEPT TAKE TWO TABLETS BY MOUTH DAILY   Farxiga 10 MG Tabs tablet Generic drug: dapagliflozin propanediol TAKE ONE TABLET EACH MORNING BEFORE BREAKFAST   ferrous sulfate 325 (65 FE) MG EC tablet Take 325 mg by mouth every Monday, Wednesday, and Friday.   glipiZIDE 5 MG tablet Commonly known as: GLUCOTROL TAKE 1 TABLET BY MOUTH TWICE DAILY BEFORE MEALS   ipratropium 0.03 % nasal spray Commonly known as: ATROVENT Place 2 sprays into both nostrils every 12 (twelve) hours for 3 days.   Lancet Device Misc Use to check BS BID Dx E11.9   levothyroxine 100 MCG tablet Commonly known as: SYNTHROID TAKE ONE (1) TABLET EACH DAY   lidocaine 5 % Commonly known as: LIDODERM APPLY 1 PATCH AND LEAVE ON FOR 12 HOURS THEN OFF FOR 12 HOURS   loperamide 2 MG capsule Commonly known as: IMODIUM TAKE 2 CAPSULES BY MOUTH AS NEEDED FOR DIARRHEA/LOOSE STOOL. FOLLOWED BY 1 CAPSULE AFTER EACH LOOSE STOOL (MAX OF 8 CAPSULES DAILY)   losartan 25 MG tablet Commonly known as: COZAAR Take 12.5 mg by mouth daily.   lubiprostone 24 MCG capsule Commonly known as: AMITIZA TAKE 1 CAPSULE TWICE A DAY WITH MEALS.   methocarbamol 500 MG tablet Commonly known as: ROBAXIN TAKE 1/2 TO 1 TABLET BY MOUTH TWICE DAILY AS NEEDED FOR MUSCLE SPASMS   metoprolol  succinate 25 MG 24 hr tablet Commonly known as: TOPROL-XL Take 0.5 tablets (12.5 mg total) by mouth daily. What changed: how much to take Changed by: Claretta Fraise, MD   mirtazapine 15 MG tablet Commonly known as: REMERON TAKE ONE TABLET BY MOUTH AT BEDTIME   multivitamin with minerals  Tabs tablet Take 1 tablet by mouth daily.   nitroGLYCERIN 0.4 MG SL tablet Commonly known as: NITROSTAT DISSOLVE 1 TAB UNDER TOUNGE FOR CHEST PAIN. MAY REPEAT EVERY 5 MINUTES FOR 3 DOSES. IF NO RELIEF CALL 911 OR GO TO ER   ondansetron 4 MG disintegrating tablet Commonly known as: ZOFRAN-ODT TAKE 1 TABLET BY MOUTH EVERY 8 HOURS AS NEEDED FOR NAUSEA & VOMITING   OneTouch Delica Plus [id number] Misc Check BS BID Dx E11.9   OneTouch Verio test strip Generic drug: glucose blood Check BS BID Dx E11.9   pantoprazole 40 MG tablet Commonly known as: PROTONIX TAKE ONE (1) TABLET EACH DAY   rosuvastatin 10 MG tablet Commonly known as: CRESTOR TAKE ONE (1) TABLET EACH DAY   Rybelsus 7 MG Tabs Generic drug: Semaglutide TAKE ONE CAPSULE BY MOUTH DAILY   sodium chloride 0.65 % nasal spray Commonly known as: OCEAN Place into the nose.   spironolactone 25 MG tablet Commonly known as: ALDACTONE Take 12.5 mg by mouth daily.   Symbicort 80-4.5 MCG/ACT inhaler Generic drug: budesonide-formoterol Inhale 2 puffs into the lungs 2 (two) times daily as needed.   torsemide 20 MG tablet Commonly known as: DEMADEX TAKE TWO TABLETS BY MOUTH TWICE DAILY What changed: when to take this         Follow-up: Return in about 2 weeks (around 03/06/2022), or With Dr. Philis Kendall, M.D.

## 2022-02-21 ENCOUNTER — Ambulatory Visit: Payer: Medicare Other | Admitting: Psychiatry

## 2022-02-21 LAB — CBC WITH DIFFERENTIAL/PLATELET
Basophils Absolute: 0.1 10*3/uL (ref 0.0–0.2)
Basos: 1 %
EOS (ABSOLUTE): 0.1 10*3/uL (ref 0.0–0.4)
Eos: 1 %
Hematocrit: 44.6 % (ref 37.5–51.0)
Hemoglobin: 14.9 g/dL (ref 13.0–17.7)
Immature Grans (Abs): 0.3 10*3/uL — ABNORMAL HIGH (ref 0.0–0.1)
Immature Granulocytes: 3 %
Lymphocytes Absolute: 1.1 10*3/uL (ref 0.7–3.1)
Lymphs: 12 %
MCH: 33.5 pg — ABNORMAL HIGH (ref 26.6–33.0)
MCHC: 33.4 g/dL (ref 31.5–35.7)
MCV: 100 fL — ABNORMAL HIGH (ref 79–97)
Monocytes Absolute: 0.7 10*3/uL (ref 0.1–0.9)
Monocytes: 8 %
Neutrophils Absolute: 6.9 10*3/uL (ref 1.4–7.0)
Neutrophils: 75 %
Platelets: 166 10*3/uL (ref 150–450)
RBC: 4.45 x10E6/uL (ref 4.14–5.80)
RDW: 14.4 % (ref 11.6–15.4)
WBC: 9.2 10*3/uL (ref 3.4–10.8)

## 2022-02-21 LAB — TSH+FREE T4
Free T4: 1.92 ng/dL — ABNORMAL HIGH (ref 0.82–1.77)
TSH: 3.14 u[IU]/mL (ref 0.450–4.500)

## 2022-02-21 LAB — CMP14+EGFR
ALT: 26 IU/L (ref 0–44)
AST: 32 IU/L (ref 0–40)
Albumin/Globulin Ratio: 1.6 (ref 1.2–2.2)
Albumin: 4.4 g/dL (ref 3.7–4.7)
Alkaline Phosphatase: 67 IU/L (ref 44–121)
BUN/Creatinine Ratio: 27 — ABNORMAL HIGH (ref 10–24)
BUN: 46 mg/dL — ABNORMAL HIGH (ref 8–27)
Bilirubin Total: 0.8 mg/dL (ref 0.0–1.2)
CO2: 30 mmol/L — ABNORMAL HIGH (ref 20–29)
Calcium: 10.1 mg/dL (ref 8.6–10.2)
Chloride: 85 mmol/L — ABNORMAL LOW (ref 96–106)
Creatinine, Ser: 1.69 mg/dL — ABNORMAL HIGH (ref 0.76–1.27)
Globulin, Total: 2.8 g/dL (ref 1.5–4.5)
Glucose: 214 mg/dL — ABNORMAL HIGH (ref 70–99)
Potassium: 3.9 mmol/L (ref 3.5–5.2)
Sodium: 132 mmol/L — ABNORMAL LOW (ref 134–144)
Total Protein: 7.2 g/dL (ref 6.0–8.5)
eGFR: 40 mL/min/{1.73_m2} — ABNORMAL LOW (ref >59)

## 2022-02-21 LAB — BRAIN NATRIURETIC PEPTIDE: BNP: 286.8 pg/mL — ABNORMAL HIGH (ref 0.0–100.0)

## 2022-02-21 NOTE — Progress Notes (Signed)
Hello [place],  Your lab result is normal and/or stable.Some minor variations that are not significant are commonly marked abnormal, but do not represent any medical problem for you.  Best regards, Taryll Reichenberger, M.D.

## 2022-02-24 ENCOUNTER — Encounter: Payer: Self-pay | Admitting: Family Medicine

## 2022-03-01 ENCOUNTER — Emergency Department (HOSPITAL_COMMUNITY)
Admission: EM | Admit: 2022-03-01 | Discharge: 2022-03-01 | Disposition: A | Discharge status: Home / Self Care | Payer: Medicare Other | Attending: Emergency Medicine | Admitting: Emergency Medicine

## 2022-03-01 ENCOUNTER — Other Ambulatory Visit: Payer: Self-pay

## 2022-03-01 ENCOUNTER — Emergency Department (HOSPITAL_COMMUNITY): Payer: Medicare Other

## 2022-03-01 ENCOUNTER — Encounter (HOSPITAL_COMMUNITY): Payer: Self-pay | Admitting: Emergency Medicine

## 2022-03-01 DIAGNOSIS — R0602 Shortness of breath: Secondary | ICD-10-CM | POA: Diagnosis not present

## 2022-03-01 DIAGNOSIS — Z7901 Long term (current) use of anticoagulants: Secondary | ICD-10-CM | POA: Insufficient documentation

## 2022-03-01 DIAGNOSIS — M7989 Other specified soft tissue disorders: Secondary | ICD-10-CM | POA: Insufficient documentation

## 2022-03-01 DIAGNOSIS — I509 Heart failure, unspecified: Secondary | ICD-10-CM | POA: Insufficient documentation

## 2022-03-01 DIAGNOSIS — I491 Atrial premature depolarization: Secondary | ICD-10-CM | POA: Diagnosis not present

## 2022-03-01 DIAGNOSIS — Z7982 Long term (current) use of aspirin: Secondary | ICD-10-CM | POA: Diagnosis not present

## 2022-03-01 DIAGNOSIS — R0789 Other chest pain: Secondary | ICD-10-CM | POA: Diagnosis not present

## 2022-03-01 DIAGNOSIS — R079 Chest pain, unspecified: Secondary | ICD-10-CM | POA: Diagnosis not present

## 2022-03-01 DIAGNOSIS — J449 Chronic obstructive pulmonary disease, unspecified: Secondary | ICD-10-CM | POA: Insufficient documentation

## 2022-03-01 DIAGNOSIS — J811 Chronic pulmonary edema: Secondary | ICD-10-CM | POA: Diagnosis not present

## 2022-03-01 DIAGNOSIS — T68XXXA Hypothermia, initial encounter: Secondary | ICD-10-CM | POA: Diagnosis not present

## 2022-03-01 LAB — CBC WITH DIFFERENTIAL/PLATELET
Abs Immature Granulocytes: 0.39 10*3/uL — ABNORMAL HIGH (ref 0.00–0.07)
Basophils Absolute: 0.1 10*3/uL (ref 0.0–0.1)
Basophils Relative: 1 %
Eosinophils Absolute: 0.1 10*3/uL (ref 0.0–0.5)
Eosinophils Relative: 1 %
HCT: 41.8 % (ref 39.0–52.0)
Hemoglobin: 14 g/dL (ref 13.0–17.0)
Immature Granulocytes: 5 %
Lymphocytes Relative: 14 %
Lymphs Abs: 1.1 10*3/uL (ref 0.7–4.0)
MCH: 33.7 pg (ref 26.0–34.0)
MCHC: 33.5 g/dL (ref 30.0–36.0)
MCV: 100.5 fL — ABNORMAL HIGH (ref 80.0–100.0)
Monocytes Absolute: 0.7 10*3/uL (ref 0.1–1.0)
Monocytes Relative: 8 %
Neutro Abs: 5.5 10*3/uL (ref 1.7–7.7)
Neutrophils Relative %: 71 %
Platelets: 141 10*3/uL — ABNORMAL LOW (ref 150–400)
RBC: 4.16 MIL/uL — ABNORMAL LOW (ref 4.22–5.81)
RDW: 14.5 % (ref 11.5–15.5)
WBC: 7.8 10*3/uL (ref 4.0–10.5)
nRBC: 0 % (ref 0.0–0.2)

## 2022-03-01 LAB — BASIC METABOLIC PANEL
Anion gap: 14 (ref 5–15)
BUN: 31 mg/dL — ABNORMAL HIGH (ref 8–23)
CO2: 28 mmol/L (ref 22–32)
Calcium: 9.1 mg/dL (ref 8.9–10.3)
Chloride: 98 mmol/L (ref 98–111)
Creatinine, Ser: 1.62 mg/dL — ABNORMAL HIGH (ref 0.61–1.24)
GFR, Estimated: 42 mL/min — ABNORMAL LOW (ref >60)
Glucose, Bld: 170 mg/dL — ABNORMAL HIGH (ref 70–99)
Potassium: 3.5 mmol/L (ref 3.5–5.1)
Sodium: 140 mmol/L (ref 135–145)

## 2022-03-01 LAB — BRAIN NATRIURETIC PEPTIDE: B Natriuretic Peptide: 510.6 pg/mL — ABNORMAL HIGH (ref 0.0–100.0)

## 2022-03-01 LAB — TROPONIN I (HIGH SENSITIVITY): Troponin I (High Sensitivity): 32 ng/L — ABNORMAL HIGH (ref <18)

## 2022-03-01 MED ORDER — FUROSEMIDE 10 MG/ML IJ SOLN
40.0000 mg | Freq: Once | INTRAMUSCULAR | Status: AC
  Administered 2022-03-01: 40 mg via INTRAVENOUS
  Filled 2022-03-01: qty 4

## 2022-03-01 NOTE — ED Notes (Signed)
Pt called pt is number 2 on the list

## 2022-03-01 NOTE — ED Provider Notes (Signed)
[facility] EMERGENCY DEPARTMENT Provider Note   CSN: [id number] Arrival date & time: 03/01/22  2035     History  Chief Complaint  Patient presents with   Shortness of Breath    Matthew Campos is a 81 y.o. male.  Patient here with shortness of breath.  Recently admitted for volume overload at [place] into an argument with his wife tonight and then got short of breath.  He is feeling better now.  No chest pain.  Still with a little bit of leg swelling.  He has memory issues at baseline not sure of his current changes in medications if any.  He is on 3 L of oxygen at baseline.  He has a history of heart failure, COPD.  Nothing makes it worse or better.  He denies any chest pain or active shortness of breath or abdominal pain or nausea or vomiting at this time.  The history is provided by the patient.       Home Medications Prior to Admission medications   Medication Sig Start Date End Date Taking? Authorizing Provider  acetaZOLAMIDE (DIAMOX) 250 MG tablet Take by mouth. 05/14/19 05/21/21  [provider]  albuterol (PROVENTIL) (2.5 MG/3ML) 0.083% nebulizer solution Take 2.5 mg by nebulization every 6 (six) hours as needed for wheezing or shortness of breath.    [provider]  albuterol (VENTOLIN HFA) 108 (90 Base) MCG/ACT inhaler INHALE 2 PUFFS INTO THE LUNGS EVERY 6 HOURS AS NEEDED FOR WHEEZING OR SHORTNESS OF BREATH 01/24/22   Ronnie Doss M, DO  apixaban (ELIQUIS) 2.5 MG TABS tablet Take 2.5 mg by mouth 2 (two) times daily.    [provider]  aspirin EC 81 MG tablet Take 81 mg by mouth daily.    [provider]  azelastine (ASTELIN) 0.1 % nasal spray Place 1 spray into both nostrils 2 (two) times daily. For runny nose 04/19/21   Ronnie Doss M, DO  benzonatate (TESSALON PERLES) 100 MG capsule Take 1 capsule (100 mg total) by mouth 3 (three) times daily as needed. 02/06/22   [name], DO  Blood  Glucose Monitoring Suppl (BLOOD GLUCOSE MONITOR SYSTEM) w/Device KIT 1 Device by Does not apply route 2 (two) times daily. Dx E11.9 05/08/21   Ronnie Doss M, DO  calcium carbonate (OSCAL) 1500 (600 Ca) MG TABS tablet Take by mouth 2 (two) times daily with a meal.    [provider]  clotrimazole-betamethasone (LOTRISONE) cream APPLY TWICE DAILY TO TORSO FOR UP TO 14 DAYS 01/25/22   Gottschalk, Leatrice Jewels M, DO  dapagliflozin propanediol (FARXIGA) 10 MG TABS tablet TAKE ONE TABLET EACH MORNING BEFORE BREAKFAST 01/24/22   Ronnie Doss M, DO  dextromethorphan-guaiFENesin (MUCINEX DM) 30-600 MG 12hr tablet Take 1 tablet by mouth 2 (two) times daily. 10/25/20   Ivy Lynn, NP  donepezil (ARICEPT) 5 MG tablet TAKE TWO TABLETS BY MOUTH DAILY 01/24/22   Genia Harold, MD  ferrous sulfate 325 (65 FE) MG EC tablet Take 325 mg by mouth every Monday, Wednesday, and Friday.  06/18/19   [provider]  glipiZIDE (GLUCOTROL) 5 MG tablet TAKE 1 TABLET BY MOUTH TWICE DAILY BEFORE MEALS 02/20/22   Ronnie Doss M, DO  glucose blood (ONETOUCH VERIO) test strip Check BS BID Dx E11.9 11/29/21   Ronnie Doss M, DO  ipratropium (ATROVENT) 0.03 % nasal spray Place 2 sprays into both nostrils every 12 (twelve) hours for 3 days. 09/14/21 09/17/21  Blanch Media,  Shireen Quan, FNP  Lancet Device MISC Use to check BS BID Dx E11.9 05/08/21   Ronnie Doss M, DO  Lancets [facility] DELICA PLUS [id number]) MISC Check BS BID Dx E11.9 11/29/21   Ronnie Doss M, DO  levothyroxine (SYNTHROID) 100 MCG tablet TAKE ONE (1) TABLET EACH DAY 05/21/21   Gottschalk, Leatrice Jewels M, DO  lidocaine (LIDODERM) 5 % APPLY 1 PATCH AND LEAVE ON FOR 12 HOURS THEN OFF FOR 12 HOURS 06/18/21   Gottschalk, Leatrice Jewels M, DO  loperamide (IMODIUM) 2 MG capsule TAKE 2 CAPSULES BY MOUTH AS NEEDED FOR DIARRHEA/LOOSE STOOL. FOLLOWED BY 1 CAPSULE AFTER EACH LOOSE STOOL (MAX OF 8 CAPSULES DAILY) 02/21/22   Evelina Dun A, FNP  losartan (COZAAR) 25 MG  tablet Take 12.5 mg by mouth daily.    [provider]  lubiprostone (AMITIZA) 24 MCG capsule TAKE 1 CAPSULE TWICE A DAY WITH MEALS. 01/24/22   [name], DO  methocarbamol (ROBAXIN) 500 MG tablet TAKE 1/2 TO 1 TABLET BY MOUTH TWICE DAILY AS NEEDED FOR MUSCLE SPASMS 02/21/22   Evelina Dun A, FNP  metoprolol succinate (TOPROL-XL) 25 MG 24 hr tablet Take 0.5 tablets (12.5 mg total) by mouth daily. 02/20/22   Claretta Fraise, MD  mirtazapine (REMERON) 15 MG tablet TAKE ONE TABLET BY MOUTH AT BEDTIME 11/28/21   Chevis Pretty, FNP  Multiple Vitamin (MULTIVITAMIN WITH MINERALS) TABS Take 1 tablet by mouth daily.    [provider]  nitroGLYCERIN (NITROSTAT) 0.4 MG SL tablet DISSOLVE 1 TAB UNDER TOUNGE FOR CHEST PAIN. MAY REPEAT EVERY 5 MINUTES FOR 3 DOSES. IF NO RELIEF CALL 911 OR GO TO ER 02/20/22   Ronnie Doss M, DO  ondansetron (ZOFRAN-ODT) 4 MG disintegrating tablet TAKE 1 TABLET BY MOUTH EVERY 8 HOURS AS NEEDED FOR NAUSEA & VOMITING 01/25/22   Ronnie Doss M, DO  pantoprazole (PROTONIX) 40 MG tablet TAKE ONE (1) TABLET EACH DAY 01/24/22   Gottschalk, Leatrice Jewels M, DO  rosuvastatin (CRESTOR) 10 MG tablet TAKE ONE (1) TABLET EACH DAY 02/20/22   Gottschalk, Ashly M, DO  Semaglutide (RYBELSUS) 7 MG TABS TAKE ONE CAPSULE BY MOUTH DAILY 01/24/22   Ronnie Doss M, DO  sodium chloride (OCEAN) 0.65 % nasal spray Place into the nose.    [provider]  spironolactone (ALDACTONE) 25 MG tablet Take 12.5 mg by mouth daily. 05/15/19 09/07/19  [provider]  SYMBICORT 80-4.5 MCG/ACT inhaler Inhale 2 puffs into the lungs 2 (two) times daily as needed. 11/06/21   [name], DO  torsemide (DEMADEX) 20 MG tablet TAKE TWO TABLETS BY MOUTH TWICE DAILY Patient taking differently: Take 40 mg by mouth daily. 01/24/22   [name], DO      Allergies    Codeine, Latex, Morphine, and Lisinopril    Review of Systems   Review of  Systems  Physical Exam Updated Vital Signs BP (!) 96/56   Pulse 72   Temp 98.4 F (36.9 C) (Oral)   Resp 20   SpO2 97%  Physical Exam Vitals and nursing note reviewed.  Constitutional:      General: He is not in acute distress.    Appearance: He is well-developed. He is not ill-appearing.  HENT:     Head: Normocephalic and atraumatic.  Eyes:     Conjunctiva/sclera: Conjunctivae normal.     Pupils: Pupils are equal, round, and reactive to light.  Cardiovascular:     Rate and Rhythm: Normal rate. Rhythm irregular.  Pulses: Normal pulses.     Heart sounds: Normal heart sounds. No murmur heard. Pulmonary:     Effort: Pulmonary effort is normal. No respiratory distress.     Breath sounds: Normal breath sounds. No decreased breath sounds.  Abdominal:     Palpations: Abdomen is soft.     Tenderness: There is no abdominal tenderness.  Musculoskeletal:        General: No swelling.     Cervical back: Normal range of motion and neck supple.     Right lower leg: Edema present.     Left lower leg: Edema present.     Comments: Trace edema in her legs  Skin:    General: Skin is warm and dry.     Capillary Refill: Capillary refill takes less than 2 seconds.  Neurological:     General: No focal deficit present.     Mental Status: He is alert.  Psychiatric:        Mood and Affect: Mood normal.     ED Results / Procedures / Treatments   Labs (all labs ordered are listed, but only abnormal results are displayed) Labs Reviewed  CBC WITH DIFFERENTIAL/PLATELET - Abnormal; Notable for the following components:      Result Value   RBC 4.16 (*)    MCV 100.5 (*)    Platelets 141 (*)    Abs Immature Granulocytes 0.39 (*)    All other components within normal limits  BASIC METABOLIC PANEL - Abnormal; Notable for the following components:   Glucose, Bld 170 (*)    BUN 31 (*)    Creatinine, Ser 1.62 (*)    GFR, Estimated 42 (*)    All other components within normal limits  BRAIN  NATRIURETIC PEPTIDE - Abnormal; Notable for the following components:   B Natriuretic Peptide 510.6 (*)    All other components within normal limits  TROPONIN I (HIGH SENSITIVITY) - Abnormal; Notable for the following components:   Troponin I (High Sensitivity) 32 (*)    All other components within normal limits    EKG EKG Interpretation  Date/Time:  Friday March 01 2022 20:42:27 EST Ventricular Rate:  98 PR Interval:    QRS Duration: 182 QT Interval:  409 QTC Calculation: 523 R Axis:   -71 Text Interpretation: Atrial fibrillation Multiple ventricular premature complexes Left bundle branch block Confirmed by Lennice Sites ([id number]) on 03/01/2022 8:47:01 PM  Radiology DG Chest Portable 1 View  Result Date: 03/01/2022 CLINICAL DATA:  Shortness of breath. EXAM: PORTABLE CHEST 1 VIEW COMPARISON:  02/06/2022 FINDINGS: Stable heart size and mediastinal contours. Prior TAVR. Lead less cardiac pacemaker. Coronary stent visualized. Again seen vascular congestion. Improvement in pulmonary edema from prior exam. No pneumothorax or large pleural effusion. IMPRESSION: 1. Improvement in pulmonary edema from prior exam. 2. Cardiomegaly and vascular congestion are unchanged. Electronically Signed   By: Keith Rake M.D.   On: 03/01/2022 21:06    Procedures Procedures    Medications Ordered in ED Medications  furosemide (LASIX) injection 40 mg (40 mg Intravenous Given 03/01/22 2257)    ED Course/ Medical Decision Making/ A&P                           Medical Decision Making Amount and/or Complexity of Data Reviewed Labs: ordered. Radiology: ordered.  Risk Prescription drug management.   Matthew Campos is here with shortness of breath.  Symptoms have now resolved.  Recently admitted for heart  failure exacerbation.  History of COPD, dementia, congestive heart failure on 3 L of oxygen at baseline.  He is noticed some leg swelling here the last few days but overall he is at his  baseline oxygen.  He denies any chest pain.  EKG shows atrial fibrillation rate controlled.  He states he had an argument with his wife tonight and that made him feel short of breath.  Differential diagnosis is likely that this was may be a stress related event but will evaluate for ACS, heart failure exacerbation he has clear breath sounds.  Do not think this is a COPD exacerbation.  His vital signs are normal.  He is on his baseline oxygen.  We will get CBC, BMP, BNP, troponin, chest x-ray reevaluate.  Per my review and interpretation of chest x-ray there is no obvious pneumonia.  Radiology report states overall improvement of volume overload from prior chest x-ray.  Troponin at baseline.  BNP also at baseline if not a little bit improved.  He clinically appears stable.  He has no significant anemia or electrolyte abnormality or kidney injury.  He is not having any chest pain or shortness of breath.  We will give him a little extra dose of Lasix while he has an IV and but ultimately patient seems to be stable for discharge.  Seems that he did get into a little bit of an argument with his wife tonight but she has no other concerns.  I do not think he is having any acute significant respiratory or cardiac process at this time.  Can continue current medications at home.  Discharged in good condition.  This chart was dictated using voice recognition software.  Despite best efforts to proofread,  errors can occur which can change the documentation meaning.         Final Clinical Impression(s) / ED Diagnoses Final diagnoses:  Shortness of breath    Rx / DC Orders ED Discharge Orders     None         Lennice Sites, DO 03/01/22 2318

## 2022-03-01 NOTE — ED Triage Notes (Signed)
Pt brought to ED by [facility] with c/o shortness of breath. Initial O2 92% increased to 97% on 3L liters. Pt has possibly has pacemaker, but states he is unaware of having one. EMS reports EKG abnormalities, stating initial 4-lead showed HR in 180s, however after getting on stretcher rhythm converted to regular paced rhythm.   EMS Vitals BP 118/76 HR 68 RR 16 SPO2 97% 3L O2

## 2022-03-02 DIAGNOSIS — R531 Weakness: Secondary | ICD-10-CM | POA: Diagnosis not present

## 2022-03-02 DIAGNOSIS — Z743 Need for continuous supervision: Secondary | ICD-10-CM | POA: Diagnosis not present

## 2022-03-03 DIAGNOSIS — J449 Chronic obstructive pulmonary disease, unspecified: Secondary | ICD-10-CM | POA: Diagnosis not present

## 2022-03-06 ENCOUNTER — Ambulatory Visit: Payer: Medicare Other | Admitting: Family Medicine

## 2022-03-06 ENCOUNTER — Encounter: Payer: Self-pay | Admitting: Family Medicine

## 2022-03-10 DIAGNOSIS — E1122 Type 2 diabetes mellitus with diabetic chronic kidney disease: Secondary | ICD-10-CM | POA: Diagnosis not present

## 2022-03-10 DIAGNOSIS — N1831 Chronic kidney disease, stage 3a: Secondary | ICD-10-CM | POA: Diagnosis not present

## 2022-03-10 DIAGNOSIS — J449 Chronic obstructive pulmonary disease, unspecified: Secondary | ICD-10-CM | POA: Diagnosis not present

## 2022-03-10 DIAGNOSIS — I219 Acute myocardial infarction, unspecified: Secondary | ICD-10-CM | POA: Diagnosis not present

## 2022-03-14 DIAGNOSIS — N183 Chronic kidney disease, stage 3 unspecified: Secondary | ICD-10-CM | POA: Diagnosis not present

## 2022-03-14 DIAGNOSIS — G309 Alzheimer's disease, unspecified: Secondary | ICD-10-CM | POA: Diagnosis not present

## 2022-03-14 DIAGNOSIS — E1169 Type 2 diabetes mellitus with other specified complication: Secondary | ICD-10-CM | POA: Diagnosis not present

## 2022-03-14 DIAGNOSIS — I509 Heart failure, unspecified: Secondary | ICD-10-CM | POA: Diagnosis not present

## 2022-03-14 DIAGNOSIS — J449 Chronic obstructive pulmonary disease, unspecified: Secondary | ICD-10-CM | POA: Diagnosis not present

## 2022-03-14 DIAGNOSIS — Z9981 Dependence on supplemental oxygen: Secondary | ICD-10-CM | POA: Diagnosis not present

## 2022-03-15 DIAGNOSIS — G309 Alzheimer's disease, unspecified: Secondary | ICD-10-CM | POA: Diagnosis not present

## 2022-03-15 DIAGNOSIS — I5043 Acute on chronic combined systolic (congestive) and diastolic (congestive) heart failure: Secondary | ICD-10-CM | POA: Diagnosis not present

## 2022-03-15 DIAGNOSIS — E119 Type 2 diabetes mellitus without complications: Secondary | ICD-10-CM | POA: Diagnosis not present

## 2022-03-15 DIAGNOSIS — I110 Hypertensive heart disease with heart failure: Secondary | ICD-10-CM | POA: Diagnosis not present

## 2022-03-15 DIAGNOSIS — J449 Chronic obstructive pulmonary disease, unspecified: Secondary | ICD-10-CM | POA: Diagnosis not present

## 2022-03-18 ENCOUNTER — Telehealth: Payer: Self-pay | Admitting: Family Medicine

## 2022-03-18 NOTE — Telephone Encounter (Signed)
Patient has been called and scheduled

## 2022-03-19 ENCOUNTER — Other Ambulatory Visit: Payer: Self-pay | Admitting: Family Medicine

## 2022-03-19 ENCOUNTER — Other Ambulatory Visit: Payer: Self-pay | Admitting: Family

## 2022-03-19 DIAGNOSIS — I110 Hypertensive heart disease with heart failure: Secondary | ICD-10-CM | POA: Diagnosis not present

## 2022-03-19 DIAGNOSIS — J3489 Other specified disorders of nose and nasal sinuses: Secondary | ICD-10-CM

## 2022-03-19 DIAGNOSIS — G8929 Other chronic pain: Secondary | ICD-10-CM

## 2022-03-19 DIAGNOSIS — G309 Alzheimer's disease, unspecified: Secondary | ICD-10-CM | POA: Diagnosis not present

## 2022-03-19 DIAGNOSIS — I5043 Acute on chronic combined systolic (congestive) and diastolic (congestive) heart failure: Secondary | ICD-10-CM | POA: Diagnosis not present

## 2022-03-19 DIAGNOSIS — E1122 Type 2 diabetes mellitus with diabetic chronic kidney disease: Secondary | ICD-10-CM

## 2022-03-19 DIAGNOSIS — R197 Diarrhea, unspecified: Secondary | ICD-10-CM

## 2022-03-19 DIAGNOSIS — E119 Type 2 diabetes mellitus without complications: Secondary | ICD-10-CM | POA: Diagnosis not present

## 2022-03-19 DIAGNOSIS — J449 Chronic obstructive pulmonary disease, unspecified: Secondary | ICD-10-CM | POA: Diagnosis not present

## 2022-03-21 DIAGNOSIS — G309 Alzheimer's disease, unspecified: Secondary | ICD-10-CM | POA: Diagnosis not present

## 2022-03-21 DIAGNOSIS — J449 Chronic obstructive pulmonary disease, unspecified: Secondary | ICD-10-CM | POA: Diagnosis not present

## 2022-03-21 DIAGNOSIS — E119 Type 2 diabetes mellitus without complications: Secondary | ICD-10-CM | POA: Diagnosis not present

## 2022-03-21 DIAGNOSIS — I110 Hypertensive heart disease with heart failure: Secondary | ICD-10-CM | POA: Diagnosis not present

## 2022-03-21 DIAGNOSIS — I5043 Acute on chronic combined systolic (congestive) and diastolic (congestive) heart failure: Secondary | ICD-10-CM | POA: Diagnosis not present

## 2022-03-26 ENCOUNTER — Telehealth: Payer: Self-pay | Admitting: Family Medicine

## 2022-03-26 DIAGNOSIS — I110 Hypertensive heart disease with heart failure: Secondary | ICD-10-CM | POA: Diagnosis not present

## 2022-03-26 DIAGNOSIS — E119 Type 2 diabetes mellitus without complications: Secondary | ICD-10-CM | POA: Diagnosis not present

## 2022-03-26 DIAGNOSIS — J449 Chronic obstructive pulmonary disease, unspecified: Secondary | ICD-10-CM | POA: Diagnosis not present

## 2022-03-26 DIAGNOSIS — I5043 Acute on chronic combined systolic (congestive) and diastolic (congestive) heart failure: Secondary | ICD-10-CM | POA: Diagnosis not present

## 2022-03-26 DIAGNOSIS — G309 Alzheimer's disease, unspecified: Secondary | ICD-10-CM | POA: Diagnosis not present

## 2022-03-26 NOTE — Telephone Encounter (Signed)
Matthew Campos states his weight is staying around the same weight. If it gets worse she will call and get him seen

## 2022-03-28 DIAGNOSIS — G309 Alzheimer's disease, unspecified: Secondary | ICD-10-CM | POA: Diagnosis not present

## 2022-03-28 DIAGNOSIS — I110 Hypertensive heart disease with heart failure: Secondary | ICD-10-CM | POA: Diagnosis not present

## 2022-03-28 DIAGNOSIS — E119 Type 2 diabetes mellitus without complications: Secondary | ICD-10-CM | POA: Diagnosis not present

## 2022-03-28 DIAGNOSIS — I5043 Acute on chronic combined systolic (congestive) and diastolic (congestive) heart failure: Secondary | ICD-10-CM | POA: Diagnosis not present

## 2022-03-28 DIAGNOSIS — J449 Chronic obstructive pulmonary disease, unspecified: Secondary | ICD-10-CM | POA: Diagnosis not present

## 2022-04-02 DIAGNOSIS — I482 Chronic atrial fibrillation, unspecified: Secondary | ICD-10-CM | POA: Insufficient documentation

## 2022-04-02 DIAGNOSIS — E119 Type 2 diabetes mellitus without complications: Secondary | ICD-10-CM | POA: Diagnosis not present

## 2022-04-02 DIAGNOSIS — I110 Hypertensive heart disease with heart failure: Secondary | ICD-10-CM | POA: Diagnosis not present

## 2022-04-02 DIAGNOSIS — J449 Chronic obstructive pulmonary disease, unspecified: Secondary | ICD-10-CM | POA: Diagnosis not present

## 2022-04-02 DIAGNOSIS — G309 Alzheimer's disease, unspecified: Secondary | ICD-10-CM | POA: Diagnosis not present

## 2022-04-02 DIAGNOSIS — I5043 Acute on chronic combined systolic (congestive) and diastolic (congestive) heart failure: Secondary | ICD-10-CM | POA: Diagnosis not present

## 2022-04-02 NOTE — Progress Notes (Deleted)
Cardiology Office Note   Date:  04/02/2022   ID:  XZAVIAR MALOOF, DOB May 11, 1940, MRN [id number]  PCP:  [name], DO  Cardiologist:   None Referring:  ***  No chief complaint on file.     History of Present Illness: Matthew Campos is a 81 y.o. male who presents with a complicated cardiac history.  I last saw him in Dec 2020.  ***   He was admitted to [facility] in [place] for SOB.  I reviewed these records for this visit.   He has a medical history including HFrEF (LVEF 35-40%), chronic AF with slow ventricular response s/p Micra leadless pacemaker on Eliquis, severe AS s/p TAVR 26 mm sapien 2019, CAD s/p multiple PCI, AAA s/p stenting 2011 followed by vascular, HTN, hypercholesterolemia, DM2 who presented to [facility] ED with c/o SOB.     ***Below is a summary of the hospital course.  On 10/28, the patient was admitted to the cardiology service for concern of HF exacerbation with SOB while in the ED with worsening lower extremity swelling. The patient's chronic medical conditions were treated accordingly per the patient's home medication regimen.  Over the course of his admission the pt was continually diuresed with labs monitored daily. Throughout his admission the pt was net neg 9.4L and weight on admission was 200lbs with discharge weight of 192. Pt will continue on home regimen of Torsemide 71m PO daily with instructions to take additional dosing with >3lbs weight gain in 24 hours or >5lbs in a week period until back to approximate discharge weight.  At the time of discharge, the patient is clinically stable with no new symptoms. Physical examination benign compared to yesterday. Remains afebrial, incision site without drainage, no swelling in extremities. Labs and images reviewed. Telemetry showed V-paced rhythm with PVCs. Patient has tolerated PO diet, has ambulated and voided without issues. Discussed DC plans with pt and family. Answered all of their questions. They  agree with discharge instructions. Pt has to come back to ER if symptoms get worse and any new symptoms arises. Follow up appointments as stated below. Patient was seen by myself and discussed with Dr. APhilbert Riserwho agreed the patient is stable for discharge. Pt is being discharged in stable condition.     EFT VENTRICLE There is normal left ventricular wall thickness. The left ventricle is moderately dilated. Left ventricular systolic function is moderately reduced. LV ejection fraction = 40-45%. Left ventricular filling pattern is indeterminate. There is hypokinesis of the mid and basal inferoseptum.  - RIGHT VENTRICLE The right ventricle is mildly dilated. The right ventricular systolic function is normal.  LEFT ATRIUM The left atrial size is normal.  RIGHT ATRIUM The right atrium is mildly dilated. - AORTIC VALVE Status post TAVR with mild paravalvular regurgitation. Aortic valve: status post TAVR with 29 mm Sapien, well-seated. Mild perivalvular regurgitation (2-3 o'clock), unchanged from prior. Normal forward gradients. - MITRAL VALVE The mitral valve is normal in structure and function. There is mild mitral regurgitation. - TRICUSPID VALVE The tricuspid valve is normal in structure and function. There is trace tricuspid regurgitation. There was insufficient TR detected to calculate RV systolic pressure. - PULMONIC VALVE Trace to mild pulmonic valvular regurgitation. There is no pulmonic valvular stenosis. - ARTERIES The ascending aorta is normal size. - VENOUS Dilated IVC with normal collapsibility consistent with a mildly elevated RA pressure. - EFFUSION There is no pericardial effusion. - -   START taking these medications losartan 25 MG  tablet Commonly known as: COZAAR Dose: 12.5 mg Instructions: Take 0.5 tablets (12.5 mg total) by mouth daily.   CHANGE how you take these medications apixaban 2.5 mg tablet *ANTICOAGULANT* Commonly known as:  ELIQUIS What changed:  medication strength  how much to take Dose: 2.5 mg Instructions: Take 1 tablet (2.5 mg total) by mouth 2 times daily. Indication: treatment to prevent blood clots in chronic atrial fibrillation  metoPROLOL succinate 25 MG 24 hr tablet Commonly known as: TOPROL-XL What changed:  medication strength  how much to take Dose: 25 mg Instructions: Take 1 tablet (25 mg total) by mouth daily.  spironolactone 25 MG tablet Commonly known as: ALDACTONE What changed: See the new instructions. Dose: 12.5 mg Instructions: Take 0.5 tablets (12.5 mg total) by mouth daily.  torsemide 40 mg tablet Commonly known as: SOAANZ What changed:  medication strength  how much to take  how to take this  when to take this  additional instructions Dose: 40 mg Instructions: Take 1 tablet (40 mg total) by mouth daily. Weight yourself daily, if weight gain greater than 3lbs in 24 hours or 5lbs in 1 week, take an additional dose of Torsemide in the afternoon. If you do not see improvement after three days, call PCP for instructions. START Date: February 19, 2022   CONTINUE taking these medications albuterol 2.5 mg /3 mL (0.083 %) nebulizer solution Dose: 2.5 mg Instructions: Inhale 3 mLs (2.5 mg total) by nebulization as needed.  albuterol 90 mcg/actuation inhaler Dose: 2 puff Instructions: Inhale 2 puffs into the lungs as needed for Wheezing or Shortness of Breath.  azelastine 137 mcg (0.1 %) nasal spray Commonly known as: ASTELIN Dose: 1 spray Instructions: 1 spray 2 times daily.  calcium carbonate 600 mg calcium (1,500 mg) tablet Commonly known as: OS-CAL Dose: 1 tablet Instructions: Take 1 tablet (1,500 mg total) by mouth daily.  dapagliflozin propanediol 10 mg tablet Commonly known as: FARXIGA Dose: 10 mg Instructions: Take 1 tablet (10 mg total) by mouth daily.  donepeziL 5 MG tablet Commonly known as: ARICEPT Dose: 10 mg Instructions: Take 2 tablets  (10 mg total) by mouth nightly.  ferrous sulfate 325 (65 FE) MG EC tablet Dose: 325 mg Instructions: Take 1 tablet (325 mg total) by mouth Every Monday, Wednesday, Friday.  glipiZIDE 5 MG tablet Commonly known as: GLUCOTROL Dose: 5 mg Instructions: Take 1 tablet (5 mg total) by mouth 2 times daily before meals.  levothyroxine 100 MCG tablet Commonly known as: SYNTHROID Dose: 100 mcg Instructions: Take 1 tablet (100 mcg total) by mouth Daily at 0600.  lidocaine 5 % patch Commonly known as: LIDODERM Dose: 1 patch Instructions: 1 patch daily.  loperamide 2 mg capsule Commonly known as: IMODIUM-AD  lubiprostone 24 MCG capsule Commonly known as: AMITIZA Dose: 24 mcg Instructions: Take 1 capsule (24 mcg total) by mouth daily as needed for Constipation.  methocarbamoL 500 MG tablet Commonly known as: ROBAXIN Dose: 500 mg Instructions: Take 1 tablet (500 mg total) by mouth daily as needed (for muscle spasms).   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   Abdominal aortic aneurysm without rupture (HCC)    Anemia    Anxiety and depression    Aortic stenosis    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HCC)    CHF (congestive heart failure) (HCC)    Cholecystitis    Chronic ischemic heart disease    Complication of anesthesia  hard to be put to sleep   COPD (chronic obstructive pulmonary disease) ([place])    Coronary artery disease    Diabetes mellitus without complication ([place])    Diabetes mellitus without complication ([place])    takes Januvia and Metformin daily   GERD (gastroesophageal reflux disease)    GERD (gastroesophageal reflux disease)    takes omeprazole daily   Headache(784.0)    sinus   History of bronchitis    last time >96yr ago   Hyperlipidemia    takes Lipitor daily   Hypertension    Hypertension    takes Metoprolol daily   Infected sebaceous cyst 01/30/2021   Joint pain    [place]ocardial infarction ([place]     x 3;last one about [id number]   Neoplasm of uncertain behavior of bladder    Obesity    OSA (obstructive sleep apnea)    Pacemaker    Pancytopenia (HCC)    [place]mal [place] fibrillation [place]   Pneumonia    last itme about [id number]   S/P TAVR (transcatheter aortic valve replacement)    Severe aortic stenosis    Sinus bradycardia     Past Surgical History:  Procedure Laterality Date   abdominal aneurysm stenting     ABDOMINAL AORTIC ANEURYSM REPAIR     per patient stents placed about 4-5 years ago   AORTIC VALVE REPLACEMENT     stated it was done in November   cholecystostomy tube     CORONARY ANGIOPLASTY  2010   CORONARY ANGIOPLASTY WITH STENT PLACEMENT     about 30 years ago per patient   cyst removed from left wrist     ESOPHAGOGASTRODUODENOSCOPY (EGD) WITH PROPOFOL N/A 04/15/2019   Procedure: ESOPHAGOGASTRODUODENOSCOPY (EGD) WITH PROPOFOL;  Surgeon: FieDanie BinderD;  [facility];  Service: Endoscopy;  Laterality: N/A;  possible dilation   HERNIA REPAIR     left shoulder surgery     POLYPECTOMY  04/15/2019   Procedure: POLYPECTOMY;  Surgeon: FieDanie BinderD;  Location: [facility];  Service: Endoscopy;;  duodenal    SHOULDER ARTHROSCOPY WITH ROTATOR CUFF REPAIR AND SUBACROMIAL DECOMPRESSION  04/17/2012   Procedure: SHOULDER ARTHROSCOPY WITH ROTATOR CUFF REPAIR AND SUBACROMIAL DECOMPRESSION;  Surgeon: W DYvette RackMD;  [facility];  Laterality: Right;  RIGHT SHOULDER ROTATOR CUFF REPAIR INCLUDING ACROMIOPLASTY CHRONIC, ARTHROSCOPY SHOULDER DEBRIDEMENT EXTENSIVE   TONSILLECTOMY     VALVE REPLACEMENT       Current Outpatient Medications  Medication Sig Dispense Refill   clotrimazole-betamethasone (LOTRISONE) cream APPLY TWICE DAILY TO TORSO FOR UP TO 14 DAYS 45 g 0   acetaZOLAMIDE (DIAMOX) 250 MG tablet Take by mouth.     albuterol (PROVENTIL) (2.5 MG/3ML) 0.083% nebulizer solution Take 2.5 mg by nebulization every 6  (six) hours as needed for wheezing or shortness of breath.     albuterol (VENTOLIN HFA) 108 (90 Base) MCG/ACT inhaler INHALE 2 PUFFS INTO THE LUNGS EVERY 6 HOURS AS NEEDED FOR WHEEZING OR SHORTNESS OF BREATH 8.5 g 2   apixaban (ELIQUIS) 2.5 MG TABS tablet Take 2.5 mg by mouth 2 (two) times daily.     aspirin EC 81 MG tablet Take 81 mg by mouth daily.     azelastine (ASTELIN) 0.1 % nasal spray USE 1 SPRAY IN EACH NOSTRIL TWICE DAILY 30 mL 5   benzonatate (TESSALON PERLES) 100 MG capsule Take 1 capsule (100 mg total) by mouth 3 (three) times daily as  needed. 20 capsule 0   Blood Glucose Monitoring Suppl (BLOOD GLUCOSE MONITOR SYSTEM) w/Device KIT 1 Device by Does not apply route 2 (two) times daily. Dx E11.9 1 kit 0   calcium carbonate (OSCAL) 1500 (600 Ca) MG TABS tablet Take by mouth 2 (two) times daily with a meal.     dextromethorphan-guaiFENesin (MUCINEX DM) 30-600 MG 12hr tablet Take 1 tablet by mouth 2 (two) times daily. 30 tablet 0   donepezil (ARICEPT) 5 MG tablet TAKE TWO TABLETS BY MOUTH DAILY 60 tablet 2   FARXIGA 10 MG TABS tablet TAKE ONE TABLET EACH MORNING BEFORE BREAKFAST 90 tablet 0   ferrous sulfate 325 (65 FE) MG EC tablet Take 325 mg by mouth every Monday, Wednesday, and Friday.      glipiZIDE (GLUCOTROL) 5 MG tablet TAKE 1 TABLET BY MOUTH TWICE DAILY BEFORE MEALS 180 tablet 0   glucose blood (ONETOUCH VERIO) test strip Check BS BID Dx E11.9 200 each 3   ipratropium (ATROVENT) 0.03 % nasal spray Place 2 sprays into both nostrils every 12 (twelve) hours for 3 days. 30 mL 0   Lancet Device MISC Use to check BS BID Dx E11.9 100 each 3   Lancets (ONETOUCH DELICA PLUS [id number]) MISC Check BS BID Dx E11.9 200 each 3   levothyroxine (SYNTHROID) 100 MCG tablet TAKE ONE (1) TABLET EACH DAY 90 tablet 3   lidocaine (LIDODERM) 5 % APPLY 1 PATCH AND LEAVE ON FOR 12 HOURS THEN OFF FOR 12 HOURS 30 patch 12   loperamide (IMODIUM) 2 MG capsule TAKE 2 CAPSULES BY MOUTH AS NEEDED FOR  DIARRHEA/LOOSE STOOL. FOLLOWED BY 1 CAPSULE AFTER EACH LOOSE STOOL (MAX OF 8 CAPSULES DAILY) 30 capsule 0   losartan (COZAAR) 25 MG tablet Take 12.5 mg by mouth daily.     lubiprostone (AMITIZA) 24 MCG capsule TAKE 1 CAPSULE TWICE A DAY WITH MEALS. 60 capsule 1   methocarbamol (ROBAXIN) 500 MG tablet TAKE 1/2 TO 1 TABLET BY MOUTH TWICE DAILY AS NEEDED FOR MUSCLE SPASMS 30 tablet 0   metoprolol succinate (TOPROL-XL) 25 MG 24 hr tablet Take 0.5 tablets (12.5 mg total) by mouth daily. 45 tablet 0   mirtazapine (REMERON) 15 MG tablet TAKE ONE TABLET BY MOUTH AT BEDTIME 90 tablet 0   Multiple Vitamin (MULTIVITAMIN WITH MINERALS) TABS Take 1 tablet by mouth daily.     nitroGLYCERIN (NITROSTAT) 0.4 MG SL tablet DISSOLVE 1 TAB UNDER TOUNGE FOR CHEST PAIN. MAY REPEAT EVERY 5 MINUTES FOR 3 DOSES. IF NO RELIEF CALL 911 OR GO TO ER 25 tablet 2   ondansetron (ZOFRAN-ODT) 4 MG disintegrating tablet TAKE 1 TABLET BY MOUTH EVERY 8 HOURS AS NEEDED FOR NAUSEA & VOMITING 30 tablet 1   pantoprazole (PROTONIX) 40 MG tablet TAKE ONE (1) TABLET EACH DAY 90 tablet 0   rosuvastatin (CRESTOR) 10 MG tablet TAKE ONE (1) TABLET EACH DAY 90 tablet 1   RYBELSUS 7 MG TABS TAKE ONE CAPSULE BY MOUTH DAILY 90 tablet 0   sodium chloride (OCEAN) 0.65 % nasal spray Place into the nose.     spironolactone (ALDACTONE) 25 MG tablet Take 12.5 mg by mouth daily.     SYMBICORT 80-4.5 MCG/ACT inhaler Inhale 2 puffs into the lungs 2 (two) times daily as needed. 10.2 g 12   torsemide (DEMADEX) 20 MG tablet TAKE TWO TABLETS BY MOUTH TWICE DAILY (Patient taking differently: Take 40 mg by mouth daily.) 120 tablet 1   No current facility-administered medications for this  visit.    Allergies:   Codeine, Latex, Morphine, and Lisinopril    ROS:  Please see the history of present illness.   Otherwise, review of systems are positive for {NONE DEFAULTED:18576}.   All other systems are reviewed and negative.    PHYSICAL EXAM: VS:  There were no  vitals taken for this visit. , BMI There is no height or weight on file to calculate BMI. GENERAL:  Well appearing NECK:  No jugular venous distention, waveform within normal limits, carotid upstroke brisk and symmetric, no bruits, no thyromegaly LUNGS:  Clear to auscultation bilaterally CHEST:  Unremarkable HEART:  PMI not displaced or sustained,S1 and S2 within normal limits, no S3, no S4, no clicks, no rubs, *** murmurs ABD:  Flat, positive bowel sounds normal in frequency in pitch, no bruits, no rebound, no guarding, no midline pulsatile mass, no hepatomegaly, no splenomegaly EXT:  2 plus pulses throughout, no edema, no cyanosis no clubbing    ***GENERAL:  Well appearing HEENT:  Pupils equal round and reactive, fundi not visualized, oral mucosa unremarkable NECK:  No jugular venous distention, waveform within normal limits, carotid upstroke brisk and symmetric, no bruits, no thyromegaly LYMPHATICS:  No cervical, inguinal adenopathy LUNGS:  Clear to auscultation bilaterally BACK:  No CVA tenderness CHEST:  Unremarkable HEART:  PMI not displaced or sustained,S1 and S2 within normal limits, no S3, no S4, no clicks, no rubs, *** murmurs ABD:  Flat, positive bowel sounds normal in frequency in pitch, no bruits, no rebound, no guarding, no midline pulsatile mass, no hepatomegaly, no splenomegaly EXT:  2 plus pulses throughout, no edema, no cyanosis no clubbing SKIN:  No rashes no nodules NEURO:  Cranial nerves II through XII grossly intact, motor grossly intact throughout PSYCH:  Cognitively intact, oriented to person place and time    EKG:  EKG {ACTION; IS/IS OIT:[id number]} ordered today. The ekg ordered today demonstrates ***   Recent Labs: 02/20/2022: ALT 26; TSH 3.140 03/01/2022: B Natriuretic Peptide 510.6; BUN 31; Creatinine, Ser 1.62; Hemoglobin 14.0; Platelets 141; Potassium 3.5; Sodium 140    Lipid Panel    Component Value Date/Time   CHOL 146 10/13/2020 1055   TRIG 340  (H) 10/13/2020 1055   HDL 35 (L) 10/13/2020 1055   CHOLHDL 4.2 10/13/2020 1055   CHOLHDL 3.3 04/13/2019 1722   VLDL 18 04/13/2019 1722   LDLCALC 58 10/13/2020 1055      Wt Readings from Last 3 Encounters:  02/20/22 192 lb 12.8 oz (87.5 kg)  02/06/22 211 lb 3.2 oz (95.8 kg)  11/06/21 200 lb 6.4 oz (90.9 kg)      Other studies Reviewed: Additional studies/ records that were reviewed today include: ***. Review of the above records demonstrates:  Please see elsewhere in the note.  ***   ASSESSMENT AND PLAN:  ***   Current medicines are reviewed at length with the patient today.  The patient {ACTIONS; HAS/DOES NOT HAVE:19233} concerns regarding medicines.  The following changes have been made:  {PLAN; NO CHANGE:13088:s}  Labs/ tests ordered today include: *** No orders of the defined types were placed in this encounter.    Disposition:   FU with ***    Signed, Minus Breeding, MD  04/02/2022 7:32 PM    [place]

## 2022-04-03 ENCOUNTER — Ambulatory Visit: Payer: Medicare Other | Admitting: Cardiology

## 2022-04-03 ENCOUNTER — Ambulatory Visit (INDEPENDENT_AMBULATORY_CARE_PROVIDER_SITE_OTHER): Payer: Medicare Other

## 2022-04-03 DIAGNOSIS — E119 Type 2 diabetes mellitus without complications: Secondary | ICD-10-CM

## 2022-04-03 DIAGNOSIS — I5043 Acute on chronic combined systolic (congestive) and diastolic (congestive) heart failure: Secondary | ICD-10-CM | POA: Diagnosis not present

## 2022-04-03 DIAGNOSIS — E039 Hypothyroidism, unspecified: Secondary | ICD-10-CM | POA: Diagnosis not present

## 2022-04-03 DIAGNOSIS — F0280 Dementia in other diseases classified elsewhere without behavioral disturbance: Secondary | ICD-10-CM

## 2022-04-03 DIAGNOSIS — G309 Alzheimer's disease, unspecified: Secondary | ICD-10-CM | POA: Diagnosis not present

## 2022-04-03 DIAGNOSIS — R001 Bradycardia, unspecified: Secondary | ICD-10-CM | POA: Diagnosis not present

## 2022-04-03 DIAGNOSIS — J449 Chronic obstructive pulmonary disease, unspecified: Secondary | ICD-10-CM

## 2022-04-03 DIAGNOSIS — I110 Hypertensive heart disease with heart failure: Secondary | ICD-10-CM | POA: Diagnosis not present

## 2022-04-03 DIAGNOSIS — I480 Paroxysmal atrial fibrillation: Secondary | ICD-10-CM

## 2022-04-03 DIAGNOSIS — I2510 Atherosclerotic heart disease of native coronary artery without angina pectoris: Secondary | ICD-10-CM | POA: Diagnosis not present

## 2022-04-03 DIAGNOSIS — Z953 Presence of xenogenic heart valve: Secondary | ICD-10-CM

## 2022-04-03 DIAGNOSIS — I5042 Chronic combined systolic (congestive) and diastolic (congestive) heart failure: Secondary | ICD-10-CM

## 2022-04-03 DIAGNOSIS — I482 Chronic atrial fibrillation, unspecified: Secondary | ICD-10-CM

## 2022-04-04 DIAGNOSIS — J449 Chronic obstructive pulmonary disease, unspecified: Secondary | ICD-10-CM | POA: Diagnosis not present

## 2022-04-04 DIAGNOSIS — I110 Hypertensive heart disease with heart failure: Secondary | ICD-10-CM | POA: Diagnosis not present

## 2022-04-04 DIAGNOSIS — E119 Type 2 diabetes mellitus without complications: Secondary | ICD-10-CM | POA: Diagnosis not present

## 2022-04-04 DIAGNOSIS — G309 Alzheimer's disease, unspecified: Secondary | ICD-10-CM | POA: Diagnosis not present

## 2022-04-04 DIAGNOSIS — I5043 Acute on chronic combined systolic (congestive) and diastolic (congestive) heart failure: Secondary | ICD-10-CM | POA: Diagnosis not present

## 2022-04-05 ENCOUNTER — Encounter: Payer: Self-pay | Admitting: Family Medicine

## 2022-04-05 ENCOUNTER — Ambulatory Visit (INDEPENDENT_AMBULATORY_CARE_PROVIDER_SITE_OTHER): Payer: Medicare Other | Admitting: Family Medicine

## 2022-04-05 VITALS — BP 130/72 | HR 80 | Temp 97.7°F | Ht 68.0 in | Wt 192.0 lb

## 2022-04-05 DIAGNOSIS — S61432A Puncture wound without foreign body of left hand, initial encounter: Secondary | ICD-10-CM

## 2022-04-05 DIAGNOSIS — W540XXA Bitten by dog, initial encounter: Secondary | ICD-10-CM

## 2022-04-05 DIAGNOSIS — J449 Chronic obstructive pulmonary disease, unspecified: Secondary | ICD-10-CM | POA: Diagnosis not present

## 2022-04-05 DIAGNOSIS — G8929 Other chronic pain: Secondary | ICD-10-CM

## 2022-04-05 DIAGNOSIS — Z23 Encounter for immunization: Secondary | ICD-10-CM

## 2022-04-05 DIAGNOSIS — S61239A Puncture wound without foreign body of unspecified finger without damage to nail, initial encounter: Secondary | ICD-10-CM | POA: Diagnosis not present

## 2022-04-05 DIAGNOSIS — M545 Low back pain, unspecified: Secondary | ICD-10-CM | POA: Diagnosis not present

## 2022-04-05 MED ORDER — ONDANSETRON 4 MG PO TBDP: 4.0000 mg | ORAL_TABLET | Freq: Three times a day (TID) | ORAL | 1 refills | Status: DC | PRN

## 2022-04-05 MED ORDER — IPRATROPIUM-ALBUTEROL 0.5-2.5 (3) MG/3ML IN SOLN
3.0000 mL | Freq: Once | RESPIRATORY_TRACT | Status: AC
  Administered 2022-04-05: 3 mL via RESPIRATORY_TRACT

## 2022-04-05 MED ORDER — METHOCARBAMOL 500 MG PO TABS: ORAL_TABLET | ORAL | 1 refills | Status: DC

## 2022-04-05 MED ORDER — AMOXICILLIN-POT CLAVULANATE 875-125 MG PO TABS: 1.0000 | ORAL_TABLET | Freq: Two times a day (BID) | ORAL | 0 refills | Status: DC

## 2022-04-05 NOTE — Addendum Note (Signed)
Addended by: Everlean Cherry on: 04/05/2022 04:11 PM   Modules accepted: Orders

## 2022-04-05 NOTE — Progress Notes (Addendum)
Subjective: CC: Dog bite PCP: [name], DO NTZ:GYFV D Duckett is a 81 y.o. male presenting to clinic today for:  1.  Dog bite Patient sustained a dog bite to the left hand just a few hours ago.  He was trying to get his dog from biting a family friend that came over.  He has some mild oozing and his wife bandaged to the lesion.  Last tetanus shot was back in 2015  2.  Shortness of breath Patient has oxygen at home but forgot to bring it today along with his inhaler.  He otherwise is compliant with the inhaler.  They monitor his weight very closely in efforts to reduce risk of readmission for heart failure.  They are establishing with cardiology closer to home, Dr. Percival Spanish, soon.  They watch the diet very closely.  3.  Low back pain Continues to have quite a bit of low back pain.  Working with home health physical therapy but balance continues to be bad.  Wants a refill on methocarbamol as this is helpful   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   Abdominal aortic aneurysm without rupture (HCC)    Anemia    Anxiety and depression    Aortic stenosis    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HCC)    CHF (congestive heart failure) (HCC)    Cholecystitis    Chronic ischemic heart disease    Complication of anesthesia    hard to be put to sleep   COPD (chronic obstructive pulmonary disease) ([place])    Coronary artery disease    Diabetes mellitus without complication ([place])    Diabetes mellitus without complication ([place])    takes Januvia and Metformin daily   GERD (gastroesophageal reflux disease)    GERD (gastroesophageal reflux disease)    takes omeprazole daily   Headache(784.0)    sinus   History of bronchitis    last time >76yr ago   Hyperlipidemia    takes Lipitor daily    Hypertension    Hypertension    takes Metoprolol daily   Infected sebaceous cyst 01/30/2021   Joint pain    [place]ocardial infarction ([place]    x 3;last one about [id number]   Neoplasm of uncertain behavior of bladder    Obesity    OSA (obstructive sleep apnea)    Pacemaker    Pancytopenia (HCC)    Paroxysmal atrial fibrillation (HCC)    Pneumonia    last itme about [id number]   S/P TAVR (transcatheter aortic valve replacement)    Severe aortic stenosis    Sinus bradycardia     Current Outpatient Medications:    albuterol (PROVENTIL) (2.5 MG/3ML) 0.083% nebulizer solution, Take 2.5 mg by nebulization every 6 (six) hours as needed for wheezing or shortness of breath., Disp: , Rfl:    albuterol (VENTOLIN HFA) 108 (90 Base) MCG/ACT inhaler, INHALE 2 PUFFS INTO THE LUNGS EVERY 6 HOURS AS NEEDED FOR WHEEZING OR SHORTNESS OF BREATH, Disp: 8.5 g, Rfl: 2   apixaban (ELIQUIS) 2.5 MG TABS tablet, Take 2.5 mg by mouth 2 (two) times daily., Disp: , Rfl:    aspirin EC 81 MG tablet, Take 81 mg by mouth daily., Disp: , Rfl:    azelastine (ASTELIN)  0.1 % nasal spray, USE 1 SPRAY IN EACH NOSTRIL TWICE DAILY, Disp: 30 mL, Rfl: 5   Blood Glucose Monitoring Suppl (BLOOD GLUCOSE MONITOR SYSTEM) w/Device KIT, 1 Device by Does not apply route 2 (two) times daily. Dx E11.9, Disp: 1 kit, Rfl: 0   calcium carbonate (OSCAL) 1500 (600 Ca) MG TABS tablet, Take by mouth 2 (two) times daily with a meal., Disp: , Rfl:    clotrimazole-betamethasone (LOTRISONE) cream, APPLY TWICE DAILY TO TORSO FOR UP TO 14 DAYS, Disp: 45 g, Rfl: 0   dextromethorphan-guaiFENesin (MUCINEX DM) 30-600 MG 12hr tablet, Take 1 tablet by mouth 2 (two) times daily., Disp: 30 tablet, Rfl: 0   donepezil (ARICEPT) 5 MG tablet, TAKE TWO TABLETS BY MOUTH DAILY, Disp: 60 tablet, Rfl: 2   FARXIGA 10 MG TABS tablet, TAKE ONE TABLET EACH MORNING BEFORE BREAKFAST, Disp: 90 tablet, Rfl: 0   ferrous sulfate 325 (65 FE) MG EC tablet, Take 325 mg by mouth  every Monday, Wednesday, and Friday. , Disp: , Rfl:    glipiZIDE (GLUCOTROL) 5 MG tablet, TAKE 1 TABLET BY MOUTH TWICE DAILY BEFORE MEALS, Disp: 180 tablet, Rfl: 0   glucose blood (ONETOUCH VERIO) test strip, Check BS BID Dx E11.9, Disp: 200 each, Rfl: 3   Lancet Device MISC, Use to check BS BID Dx E11.9, Disp: 100 each, Rfl: 3   Lancets (ONETOUCH DELICA PLUS [id number]) MISC, Check BS BID Dx E11.9, Disp: 200 each, Rfl: 3   levothyroxine (SYNTHROID) 100 MCG tablet, TAKE ONE (1) TABLET EACH DAY, Disp: 90 tablet, Rfl: 3   lidocaine (LIDODERM) 5 %, APPLY 1 PATCH AND LEAVE ON FOR 12 HOURS THEN OFF FOR 12 HOURS, Disp: 30 patch, Rfl: 12   loperamide (IMODIUM) 2 MG capsule, TAKE 2 CAPSULES BY MOUTH AS NEEDED FOR DIARRHEA/LOOSE STOOL. FOLLOWED BY 1 CAPSULE AFTER EACH LOOSE STOOL (MAX OF 8 CAPSULES DAILY), Disp: 30 capsule, Rfl: 0   losartan (COZAAR) 25 MG tablet, Take 12.5 mg by mouth daily., Disp: , Rfl:    lubiprostone (AMITIZA) 24 MCG capsule, TAKE 1 CAPSULE TWICE A DAY WITH MEALS., Disp: 60 capsule, Rfl: 1   methocarbamol (ROBAXIN) 500 MG tablet, TAKE 1/2 TO 1 TABLET BY MOUTH TWICE DAILY AS NEEDED FOR MUSCLE SPASMS, Disp: 30 tablet, Rfl: 0   metoprolol succinate (TOPROL-XL) 25 MG 24 hr tablet, Take 0.5 tablets (12.5 mg total) by mouth daily., Disp: 45 tablet, Rfl: 0   mirtazapine (REMERON) 15 MG tablet, TAKE ONE TABLET BY MOUTH AT BEDTIME, Disp: 90 tablet, Rfl: 0   Multiple Vitamin (MULTIVITAMIN WITH MINERALS) TABS, Take 1 tablet by mouth daily., Disp: , Rfl:    nitroGLYCERIN (NITROSTAT) 0.4 MG SL tablet, DISSOLVE 1 TAB UNDER TOUNGE FOR CHEST PAIN. MAY REPEAT EVERY 5 MINUTES FOR 3 DOSES. IF NO RELIEF CALL 911 OR GO TO ER, Disp: 25 tablet, Rfl: 2   ondansetron (ZOFRAN-ODT) 4 MG disintegrating tablet, TAKE 1 TABLET BY MOUTH EVERY 8 HOURS AS NEEDED FOR NAUSEA & VOMITING, Disp: 30 tablet, Rfl: 1   pantoprazole (PROTONIX) 40 MG tablet, TAKE ONE (1) TABLET EACH DAY, Disp: 90 tablet, Rfl: 0   rosuvastatin  (CRESTOR) 10 MG tablet, TAKE ONE (1) TABLET EACH DAY, Disp: 90 tablet, Rfl: 1   RYBELSUS 7 MG TABS, TAKE ONE CAPSULE BY MOUTH DAILY, Disp: 90 tablet, Rfl: 0   sodium chloride (OCEAN) 0.65 % nasal spray, Place into the nose., Disp: , Rfl:    SYMBICORT 80-4.5 MCG/ACT inhaler, Inhale 2 puffs into  the lungs 2 (two) times daily as needed., Disp: 10.2 g, Rfl: 12   torsemide (DEMADEX) 20 MG tablet, TAKE TWO TABLETS BY MOUTH TWICE DAILY (Patient taking differently: Take 40 mg by mouth daily.), Disp: 120 tablet, Rfl: 1   acetaZOLAMIDE (DIAMOX) 250 MG tablet, Take by mouth., Disp: , Rfl:    ipratropium (ATROVENT) 0.03 % nasal spray, Place 2 sprays into both nostrils every 12 (twelve) hours for 3 days., Disp: 30 mL, Rfl: 0   spironolactone (ALDACTONE) 25 MG tablet, Take 12.5 mg by mouth daily., Disp: , Rfl:  Social History   Socioeconomic History   Marital status: Married    Spouse name: Not on file   Number of children: Not on file   Years of education: Not on file   Highest education level: Not on file  Occupational History   Occupation: retired  Tobacco Use   Smoking status: Former   Smokeless tobacco: Never   Tobacco comments:    quit smoking 20+yrs ago  Scientific laboratory technician Use: Never used  Substance and Sexual Activity   Alcohol use: No    Comment: last drank about 2-3 years ago   Drug use: No   Sexual activity: Yes  Other Topics Concern   Not on file  Social History Narrative   Lives with his wife - their grandson lives with them at times       Social Determinants of Health   Financial Resource Strain: [place]  (07/26/2020)   Overall Financial Resource Strain (CARDIA)    Difficulty of Paying Living Expenses: Not very hard  Food Insecurity: No Food Insecurity (07/26/2020)   Hunger Vital Sign    Worried About Running Out of Food in the Last Year: Never true    [place] in the Last Year: Never true  Transportation Needs: No Transportation Needs (07/26/2020)   PRAPARE -  Hydrologist (Medical): No    Lack of Transportation (Non-Medical): No  Physical Activity: Inactive (09/17/2021)   Exercise Vital Sign    Days of Exercise per Week: 0 days    Minutes of Exercise per Session: 0 min  Stress: Stress Concern Present (09/17/2021)   [place]    Feeling of Stress : To some extent  Social Connections: Moderately Isolated (07/26/2020)   Social Connection and Isolation Panel [NHANES]    Frequency of Communication with Friends and Family: Twice a week    Frequency of Social Gatherings with Friends and Family: Twice a week    Attends Religious Services: Never    Marine scientist or Organizations: No    Attends Archivist Meetings: Never    Marital Status: Married  Human resources officer Violence: Not At Risk (07/26/2020)   Humiliation, Afraid, Rape, and Kick questionnaire    Fear of Current or Ex-Partner: No    Emotionally Abused: No    Physically Abused: No    Sexually Abused: No   Family History  Problem Relation Age of Onset   Cancer Mother        Unknown type   Heart disease Father    Diabetes Father    Heart disease Sister    Seizures Brother    Diabetes Daughter    Diabetes Daughter    Heart attack Son    Colon cancer Mother        in her 28s.    Stomach cancer Neg Hx  Esophageal cancer Neg Hx     Objective: Office vital signs reviewed. BP 130/72   Pulse 80   Temp 97.7 F (36.5 C)   Ht _0  (1.727 m)   Wt 192 lb (87.1 kg)   SpO2 (!) 84%   BMI 29.19 kg/m   Physical Examination:  General: Awake, alert, nontoxic male, No acute distress HEENT: sclera white, MMM Cardio: regular rate and rhythm, S1S2 heard, no murmurs appreciated Pulm: Coughing on but no wheezes, rhonchi or rales appreciated.  No dyspnea with speech. Skin: Puncture wound noted along the palmar surface of the left ring finger between the MCP and PIP joint.  He has full  active range of motion of that finger.  Lesion is hemostatic and appears to be through the subcutaneous tissue but not involving the tendon. Extremities: warm, well perfused, 1+ pitting edema, No cyanosis or clubbing  MSK: arrives in wheelchair today.  Assessment/ Plan: 81 y.o. male   Dog bite, initial encounter - Plan: amoxicillin-clavulanate (AUGMENTIN) 875-125 MG tablet  Puncture wound of finger of left hand, initial encounter  Chronic obstructive pulmonary disease, unspecified COPD type ([place]) - Plan: ipratropium-albuterol (DUONEB) 0.5-2.5 (3) MG/3ML nebulizer solution 3 mL  Chronic bilateral low back pain, unspecified whether sciatica present - Plan: methocarbamol (ROBAXIN) 500 MG tablet  Need for immunization against influenza - Plan: Flu Vaccine QUAD High Dose(Fluad)  I cleaned the area and applied a bandage.  Does not appear to be involving his tendon.  He had full active range of motion and no significant swelling, drainage.  The wound was hemostatic.  We discussed red flag signs and symptoms warranting further evaluation in the ER.  For now I am going to place him on empiric antibiotic.  No renal adjustment needed for Augmentin at this time given recent GFR of 42.  I did treat him with a DuoNeb as he was coughing quite a bit during exam and oxygen was 84% on room air.  He has home oxygen and he is to resume use of that when he gets home.  Symptomatically felt better after DuoNeb  Continues to have a low back pain that is relieved by as needed methocarbamol so this has been renewed for the patient.  Use with caution given advanced age, renal impairment.  No orders of the defined types were placed in this encounter.  No orders of the defined types were placed in this encounter.    [name], DO [place] [phone]

## 2022-04-09 DIAGNOSIS — I219 Acute myocardial infarction, unspecified: Secondary | ICD-10-CM | POA: Diagnosis not present

## 2022-04-09 DIAGNOSIS — E119 Type 2 diabetes mellitus without complications: Secondary | ICD-10-CM | POA: Diagnosis not present

## 2022-04-09 DIAGNOSIS — I110 Hypertensive heart disease with heart failure: Secondary | ICD-10-CM | POA: Diagnosis not present

## 2022-04-09 DIAGNOSIS — Z45018 Encounter for adjustment and management of other part of cardiac pacemaker: Secondary | ICD-10-CM | POA: Diagnosis not present

## 2022-04-09 DIAGNOSIS — E1122 Type 2 diabetes mellitus with diabetic chronic kidney disease: Secondary | ICD-10-CM | POA: Diagnosis not present

## 2022-04-09 DIAGNOSIS — I480 Paroxysmal atrial fibrillation: Secondary | ICD-10-CM | POA: Diagnosis not present

## 2022-04-09 DIAGNOSIS — I5043 Acute on chronic combined systolic (congestive) and diastolic (congestive) heart failure: Secondary | ICD-10-CM | POA: Diagnosis not present

## 2022-04-09 DIAGNOSIS — G309 Alzheimer's disease, unspecified: Secondary | ICD-10-CM | POA: Diagnosis not present

## 2022-04-09 DIAGNOSIS — N1831 Chronic kidney disease, stage 3a: Secondary | ICD-10-CM | POA: Diagnosis not present

## 2022-04-09 DIAGNOSIS — I482 Chronic atrial fibrillation, unspecified: Secondary | ICD-10-CM | POA: Diagnosis not present

## 2022-04-09 DIAGNOSIS — J449 Chronic obstructive pulmonary disease, unspecified: Secondary | ICD-10-CM | POA: Diagnosis not present

## 2022-04-09 DIAGNOSIS — R001 Bradycardia, unspecified: Secondary | ICD-10-CM | POA: Diagnosis not present

## 2022-04-09 DIAGNOSIS — Z950 Presence of cardiac pacemaker: Secondary | ICD-10-CM | POA: Diagnosis not present

## 2022-04-10 ENCOUNTER — Telehealth: Payer: Self-pay | Admitting: Family Medicine

## 2022-04-10 NOTE — Telephone Encounter (Signed)
Patient's son calling about patient - said he has started falling when getting out of bed. Please call back

## 2022-04-10 NOTE — Telephone Encounter (Signed)
Wife aware

## 2022-04-10 NOTE — Telephone Encounter (Signed)
Lmtcb.

## 2022-04-10 NOTE — Telephone Encounter (Signed)
Patient trying to get help for her grandpa, she is not on dpr so we are not able to tell her anything about patient but she would like info on getting home care or on facilities she could call

## 2022-04-10 NOTE — Telephone Encounter (Signed)
Just saw him so I can use that encounter.  Yes, we can complete FL2 for him but they will have to find a facility that has a bed and takes his insurance first and let me know where to send that form to.

## 2022-04-10 NOTE — Telephone Encounter (Signed)
Wife is interested in having him placed in skilled care. This will require a F2F correct? Nothing until mid or later JAN if soo.

## 2022-04-10 NOTE — Telephone Encounter (Signed)
Patient's wife calling because she is no longer able to take care of her husband on her own. Said that she would like to speak to PCP or her nurse about it. Please call back.

## 2022-04-12 NOTE — Telephone Encounter (Signed)
General information given that when family finds a facility which they would like to place pt at to let us know and I can fax it to them, they are wanting to go look at [facility] but most family members are currently sick and are not able to go look at the facility but till let us know where they decide so that I can fax the [facility].

## 2022-04-17 ENCOUNTER — Other Ambulatory Visit: Payer: Self-pay | Admitting: Psychiatry

## 2022-04-17 ENCOUNTER — Other Ambulatory Visit: Payer: Self-pay | Admitting: Family Medicine

## 2022-04-17 DIAGNOSIS — J449 Chronic obstructive pulmonary disease, unspecified: Secondary | ICD-10-CM

## 2022-04-17 DIAGNOSIS — D500 Iron deficiency anemia secondary to blood loss (chronic): Secondary | ICD-10-CM

## 2022-04-17 DIAGNOSIS — G309 Alzheimer's disease, unspecified: Secondary | ICD-10-CM

## 2022-04-17 DIAGNOSIS — R197 Diarrhea, unspecified: Secondary | ICD-10-CM

## 2022-04-21 ENCOUNTER — Emergency Department (HOSPITAL_COMMUNITY): Payer: 59

## 2022-04-21 ENCOUNTER — Other Ambulatory Visit: Payer: Self-pay

## 2022-04-21 ENCOUNTER — Encounter (HOSPITAL_COMMUNITY): Payer: Self-pay | Admitting: Emergency Medicine

## 2022-04-21 ENCOUNTER — Inpatient Hospital Stay (HOSPITAL_COMMUNITY)
Admission: EM | Admit: 2022-04-21 | Discharge: 2022-04-25 | DRG: 291 | Disposition: A | Discharge status: Home Health | Payer: 59 | Attending: Family Medicine | Admitting: Family Medicine

## 2022-04-21 DIAGNOSIS — E669 Obesity, unspecified: Secondary | ICD-10-CM | POA: Diagnosis present

## 2022-04-21 DIAGNOSIS — F0280 Dementia in other diseases classified elsewhere without behavioral disturbance: Secondary | ICD-10-CM | POA: Diagnosis not present

## 2022-04-21 DIAGNOSIS — Z888 Allergy status to other drugs, medicaments and biological substances status: Secondary | ICD-10-CM | POA: Diagnosis not present

## 2022-04-21 DIAGNOSIS — E1122 Type 2 diabetes mellitus with diabetic chronic kidney disease: Secondary | ICD-10-CM | POA: Diagnosis present

## 2022-04-21 DIAGNOSIS — K219 Gastro-esophageal reflux disease without esophagitis: Secondary | ICD-10-CM | POA: Diagnosis not present

## 2022-04-21 DIAGNOSIS — R069 Unspecified abnormalities of breathing: Secondary | ICD-10-CM | POA: Diagnosis not present

## 2022-04-21 DIAGNOSIS — G309 Alzheimer's disease, unspecified: Secondary | ICD-10-CM | POA: Diagnosis not present

## 2022-04-21 DIAGNOSIS — Z79890 Hormone replacement therapy (postmenopausal): Secondary | ICD-10-CM

## 2022-04-21 DIAGNOSIS — U071 COVID-19: Secondary | ICD-10-CM | POA: Diagnosis present

## 2022-04-21 DIAGNOSIS — F02811 Dementia in other diseases classified elsewhere, unspecified severity, with agitation: Secondary | ICD-10-CM | POA: Diagnosis not present

## 2022-04-21 DIAGNOSIS — Z6832 Body mass index (BMI) 32.0-32.9, adult: Secondary | ICD-10-CM

## 2022-04-21 DIAGNOSIS — I130 Hypertensive heart and chronic kidney disease with heart failure and stage 1 through stage 4 chronic kidney disease, or unspecified chronic kidney disease: Principal | ICD-10-CM | POA: Diagnosis present

## 2022-04-21 DIAGNOSIS — D500 Iron deficiency anemia secondary to blood loss (chronic): Secondary | ICD-10-CM

## 2022-04-21 DIAGNOSIS — I252 Old myocardial infarction: Secondary | ICD-10-CM

## 2022-04-21 DIAGNOSIS — F322 Major depressive disorder, single episode, severe without psychotic features: Secondary | ICD-10-CM | POA: Diagnosis present

## 2022-04-21 DIAGNOSIS — E119 Type 2 diabetes mellitus without complications: Secondary | ICD-10-CM

## 2022-04-21 DIAGNOSIS — N183 Chronic kidney disease, stage 3 unspecified: Secondary | ICD-10-CM | POA: Diagnosis not present

## 2022-04-21 DIAGNOSIS — R0602 Shortness of breath: Secondary | ICD-10-CM | POA: Diagnosis not present

## 2022-04-21 DIAGNOSIS — R0902 Hypoxemia: Secondary | ICD-10-CM

## 2022-04-21 DIAGNOSIS — I714 Abdominal aortic aneurysm, without rupture, unspecified: Secondary | ICD-10-CM | POA: Diagnosis not present

## 2022-04-21 DIAGNOSIS — I2510 Atherosclerotic heart disease of native coronary artery without angina pectoris: Secondary | ICD-10-CM | POA: Diagnosis present

## 2022-04-21 DIAGNOSIS — Z885 Allergy status to narcotic agent status: Secondary | ICD-10-CM

## 2022-04-21 DIAGNOSIS — Z91040 Latex allergy status: Secondary | ICD-10-CM

## 2022-04-21 DIAGNOSIS — E1165 Type 2 diabetes mellitus with hyperglycemia: Secondary | ICD-10-CM | POA: Diagnosis not present

## 2022-04-21 DIAGNOSIS — J449 Chronic obstructive pulmonary disease, unspecified: Secondary | ICD-10-CM | POA: Diagnosis not present

## 2022-04-21 DIAGNOSIS — Z8249 Family history of ischemic heart disease and other diseases of the circulatory system: Secondary | ICD-10-CM

## 2022-04-21 DIAGNOSIS — Z833 Family history of diabetes mellitus: Secondary | ICD-10-CM

## 2022-04-21 DIAGNOSIS — Z955 Presence of coronary angioplasty implant and graft: Secondary | ICD-10-CM

## 2022-04-21 DIAGNOSIS — J811 Chronic pulmonary edema: Secondary | ICD-10-CM | POA: Diagnosis not present

## 2022-04-21 DIAGNOSIS — E1142 Type 2 diabetes mellitus with diabetic polyneuropathy: Secondary | ICD-10-CM | POA: Diagnosis not present

## 2022-04-21 DIAGNOSIS — J9621 Acute and chronic respiratory failure with hypoxia: Secondary | ICD-10-CM | POA: Diagnosis present

## 2022-04-21 DIAGNOSIS — Z952 Presence of prosthetic heart valve: Secondary | ICD-10-CM

## 2022-04-21 DIAGNOSIS — Z7901 Long term (current) use of anticoagulants: Secondary | ICD-10-CM

## 2022-04-21 DIAGNOSIS — I480 Paroxysmal atrial fibrillation: Secondary | ICD-10-CM | POA: Diagnosis present

## 2022-04-21 DIAGNOSIS — Z87891 Personal history of nicotine dependence: Secondary | ICD-10-CM

## 2022-04-21 DIAGNOSIS — J441 Chronic obstructive pulmonary disease with (acute) exacerbation: Secondary | ICD-10-CM | POA: Diagnosis present

## 2022-04-21 DIAGNOSIS — F419 Anxiety disorder, unspecified: Secondary | ICD-10-CM | POA: Diagnosis present

## 2022-04-21 DIAGNOSIS — I959 Hypotension, unspecified: Secondary | ICD-10-CM | POA: Diagnosis not present

## 2022-04-21 DIAGNOSIS — Z7951 Long term (current) use of inhaled steroids: Secondary | ICD-10-CM

## 2022-04-21 DIAGNOSIS — G4733 Obstructive sleep apnea (adult) (pediatric): Secondary | ICD-10-CM | POA: Diagnosis present

## 2022-04-21 DIAGNOSIS — I5033 Acute on chronic diastolic (congestive) heart failure: Secondary | ICD-10-CM

## 2022-04-21 DIAGNOSIS — Z7982 Long term (current) use of aspirin: Secondary | ICD-10-CM

## 2022-04-21 DIAGNOSIS — Z9981 Dependence on supplemental oxygen: Secondary | ICD-10-CM | POA: Diagnosis not present

## 2022-04-21 DIAGNOSIS — E785 Hyperlipidemia, unspecified: Secondary | ICD-10-CM | POA: Diagnosis not present

## 2022-04-21 DIAGNOSIS — I5023 Acute on chronic systolic (congestive) heart failure: Secondary | ICD-10-CM | POA: Diagnosis present

## 2022-04-21 DIAGNOSIS — N1832 Chronic kidney disease, stage 3b: Secondary | ICD-10-CM | POA: Diagnosis not present

## 2022-04-21 DIAGNOSIS — E1169 Type 2 diabetes mellitus with other specified complication: Secondary | ICD-10-CM

## 2022-04-21 DIAGNOSIS — N1831 Chronic kidney disease, stage 3a: Secondary | ICD-10-CM

## 2022-04-21 DIAGNOSIS — Z7984 Long term (current) use of oral hypoglycemic drugs: Secondary | ICD-10-CM

## 2022-04-21 DIAGNOSIS — Z79899 Other long term (current) drug therapy: Secondary | ICD-10-CM

## 2022-04-21 LAB — CBC WITH DIFFERENTIAL/PLATELET
Abs Immature Granulocytes: 0.27 10*3/uL — ABNORMAL HIGH (ref 0.00–0.07)
Basophils Absolute: 0.1 10*3/uL (ref 0.0–0.1)
Basophils Relative: 1 %
Eosinophils Absolute: 0.1 10*3/uL (ref 0.0–0.5)
Eosinophils Relative: 1 %
HCT: 40.9 % (ref 39.0–52.0)
Hemoglobin: 12.9 g/dL — ABNORMAL LOW (ref 13.0–17.0)
Immature Granulocytes: 4 %
Lymphocytes Relative: 10 %
Lymphs Abs: 0.7 10*3/uL (ref 0.7–4.0)
MCH: 32.4 pg (ref 26.0–34.0)
MCHC: 31.5 g/dL (ref 30.0–36.0)
MCV: 102.8 fL — ABNORMAL HIGH (ref 80.0–100.0)
Monocytes Absolute: 0.6 10*3/uL (ref 0.1–1.0)
Monocytes Relative: 8 %
Neutro Abs: 5.1 10*3/uL (ref 1.7–7.7)
Neutrophils Relative %: 76 %
Platelets: 170 10*3/uL (ref 150–400)
RBC: 3.98 MIL/uL — ABNORMAL LOW (ref 4.22–5.81)
RDW: 15.6 % — ABNORMAL HIGH (ref 11.5–15.5)
WBC: 6.7 10*3/uL (ref 4.0–10.5)
nRBC: 0 % (ref 0.0–0.2)

## 2022-04-21 LAB — COMPREHENSIVE METABOLIC PANEL
ALT: 19 U/L (ref 0–44)
AST: 24 U/L (ref 15–41)
Albumin: 3.8 g/dL (ref 3.5–5.0)
Alkaline Phosphatase: 54 U/L (ref 38–126)
Anion gap: 11 (ref 5–15)
BUN: 18 mg/dL (ref 8–23)
CO2: 27 mmol/L (ref 22–32)
Calcium: 9 mg/dL (ref 8.9–10.3)
Chloride: 97 mmol/L — ABNORMAL LOW (ref 98–111)
Creatinine, Ser: 1.74 mg/dL — ABNORMAL HIGH (ref 0.61–1.24)
GFR, Estimated: 39 mL/min — ABNORMAL LOW (ref >60)
Glucose, Bld: 145 mg/dL — ABNORMAL HIGH (ref 70–99)
Potassium: 3.9 mmol/L (ref 3.5–5.1)
Sodium: 135 mmol/L (ref 135–145)
Total Bilirubin: 0.9 mg/dL (ref 0.3–1.2)
Total Protein: 7.3 g/dL (ref 6.5–8.1)

## 2022-04-21 LAB — BRAIN NATRIURETIC PEPTIDE: B Natriuretic Peptide: 745 pg/mL — ABNORMAL HIGH (ref 0.0–100.0)

## 2022-04-21 LAB — RESP PANEL BY RT-PCR (RSV, FLU A&B, COVID)  RVPGX2
Influenza A by PCR: NEGATIVE
Influenza B by PCR: NEGATIVE
Resp Syncytial Virus by PCR: NEGATIVE
SARS Coronavirus 2 by RT PCR: POSITIVE — AB

## 2022-04-21 LAB — GLUCOSE, CAPILLARY: Glucose-Capillary: 371 mg/dL — ABNORMAL HIGH (ref 70–99)

## 2022-04-21 MED ORDER — ONDANSETRON HCL 4 MG/2ML IJ SOLN: 4.0000 mg | Freq: Four times a day (QID) | INTRAMUSCULAR | Status: DC | PRN

## 2022-04-21 MED ORDER — POLYETHYLENE GLYCOL 3350 17 G PO PACK: 17.0000 g | PACK | Freq: Every day | ORAL | Status: DC | PRN

## 2022-04-21 MED ORDER — ACETAMINOPHEN 650 MG RE SUPP: 650.0000 mg | Freq: Four times a day (QID) | RECTAL | Status: DC | PRN

## 2022-04-21 MED ORDER — DONEPEZIL HCL 5 MG PO TABS
10.0000 mg | ORAL_TABLET | Freq: Every day | ORAL | Status: DC
  Administered 2022-04-22 – 2022-04-25 (×4): 10 mg via ORAL
  Filled 2022-04-21 (×4): qty 2

## 2022-04-21 MED ORDER — GUAIFENESIN-DM 100-10 MG/5ML PO SYRP
10.0000 mL | ORAL_SOLUTION | ORAL | Status: DC | PRN
  Administered 2022-04-21 – 2022-04-23 (×2): 10 mL via ORAL
  Filled 2022-04-21: qty 10

## 2022-04-21 MED ORDER — APIXABAN 2.5 MG PO TABS
2.5000 mg | ORAL_TABLET | Freq: Two times a day (BID) | ORAL | Status: DC
  Administered 2022-04-21 – 2022-04-25 (×8): 2.5 mg via ORAL
  Filled 2022-04-21 (×8): qty 1

## 2022-04-21 MED ORDER — MAGNESIUM SULFATE 2 GM/50ML IV SOLN
2.0000 g | Freq: Once | INTRAVENOUS | Status: AC
  Administered 2022-04-21: 2 g via INTRAVENOUS
  Filled 2022-04-21: qty 50

## 2022-04-21 MED ORDER — LOSARTAN POTASSIUM 25 MG PO TABS
12.5000 mg | ORAL_TABLET | Freq: Every day | ORAL | Status: DC
  Administered 2022-04-22 – 2022-04-25 (×4): 12.5 mg via ORAL
  Filled 2022-04-21 (×4): qty 1

## 2022-04-21 MED ORDER — METOPROLOL SUCCINATE ER 25 MG PO TB24
12.5000 mg | ORAL_TABLET | Freq: Every day | ORAL | Status: DC
  Administered 2022-04-22 – 2022-04-25 (×4): 12.5 mg via ORAL
  Filled 2022-04-21 (×4): qty 1

## 2022-04-21 MED ORDER — LEVOTHYROXINE SODIUM 100 MCG PO TABS
100.0000 ug | ORAL_TABLET | Freq: Every day | ORAL | Status: DC
  Administered 2022-04-22 – 2022-04-25 (×4): 100 ug via ORAL
  Filled 2022-04-21 (×4): qty 1

## 2022-04-21 MED ORDER — ROSUVASTATIN CALCIUM 20 MG PO TABS
10.0000 mg | ORAL_TABLET | Freq: Every day | ORAL | Status: DC
  Administered 2022-04-22 – 2022-04-25 (×4): 10 mg via ORAL
  Filled 2022-04-21 (×4): qty 1

## 2022-04-21 MED ORDER — IPRATROPIUM-ALBUTEROL 0.5-2.5 (3) MG/3ML IN SOLN: 3.0000 mL | RESPIRATORY_TRACT | Status: DC | PRN

## 2022-04-21 MED ORDER — INSULIN ASPART 100 UNIT/ML IJ SOLN
0.0000 [IU] | Freq: Every day | INTRAMUSCULAR | Status: DC
  Administered 2022-04-21: 5 [IU] via SUBCUTANEOUS

## 2022-04-21 MED ORDER — IPRATROPIUM-ALBUTEROL 0.5-2.5 (3) MG/3ML IN SOLN
3.0000 mL | Freq: Once | RESPIRATORY_TRACT | Status: AC
  Administered 2022-04-21: 3 mL via RESPIRATORY_TRACT
  Filled 2022-04-21: qty 3

## 2022-04-21 MED ORDER — ONDANSETRON HCL 4 MG PO TABS
4.0000 mg | ORAL_TABLET | Freq: Four times a day (QID) | ORAL | Status: DC | PRN
  Administered 2022-04-25: 4 mg via ORAL
  Filled 2022-04-21: qty 1

## 2022-04-21 MED ORDER — INSULIN ASPART 100 UNIT/ML IJ SOLN
0.0000 [IU] | Freq: Three times a day (TID) | INTRAMUSCULAR | Status: DC
  Administered 2022-04-22: 1 [IU] via SUBCUTANEOUS
  Administered 2022-04-22 (×2): 3 [IU] via SUBCUTANEOUS
  Administered 2022-04-23 (×2): 2 [IU] via SUBCUTANEOUS
  Administered 2022-04-24: 3 [IU] via SUBCUTANEOUS
  Administered 2022-04-24 (×2): 2 [IU] via SUBCUTANEOUS
  Administered 2022-04-25: 3 [IU] via SUBCUTANEOUS
  Administered 2022-04-25: 2 [IU] via SUBCUTANEOUS

## 2022-04-21 MED ORDER — ALBUTEROL SULFATE (2.5 MG/3ML) 0.083% IN NEBU
2.5000 mg | INHALATION_SOLUTION | Freq: Once | RESPIRATORY_TRACT | Status: AC
  Administered 2022-04-21: 2.5 mg via RESPIRATORY_TRACT
  Filled 2022-04-21: qty 3

## 2022-04-21 MED ORDER — IPRATROPIUM-ALBUTEROL 0.5-2.5 (3) MG/3ML IN SOLN
3.0000 mL | Freq: Three times a day (TID) | RESPIRATORY_TRACT | Status: AC
  Administered 2022-04-21 – 2022-04-22 (×2): 3 mL via RESPIRATORY_TRACT
  Filled 2022-04-21 (×2): qty 3

## 2022-04-21 MED ORDER — METHYLPREDNISOLONE SODIUM SUCC 125 MG IJ SOLR
125.0000 mg | Freq: Once | INTRAMUSCULAR | Status: AC
  Administered 2022-04-21: 125 mg via INTRAVENOUS
  Filled 2022-04-21: qty 2

## 2022-04-21 MED ORDER — FUROSEMIDE 10 MG/ML IJ SOLN
40.0000 mg | Freq: Two times a day (BID) | INTRAMUSCULAR | Status: DC
  Administered 2022-04-22 – 2022-04-25 (×6): 40 mg via INTRAVENOUS
  Filled 2022-04-21 (×7): qty 4

## 2022-04-21 MED ORDER — ACETAMINOPHEN 325 MG PO TABS
650.0000 mg | ORAL_TABLET | Freq: Four times a day (QID) | ORAL | Status: DC | PRN
  Administered 2022-04-24: 650 mg via ORAL
  Filled 2022-04-21: qty 2

## 2022-04-21 NOTE — Assessment & Plan Note (Signed)
CPAP nightly

## 2022-04-21 NOTE — Progress Notes (Signed)
EKG done and given to nurse 

## 2022-04-21 NOTE — Assessment & Plan Note (Signed)
Diagnosed 9 days ago.  COVID test positive today.  Acute on chronic respiratory failure with hypoxia likely secondary to pulmonary vascular congestion, seen on chest x-ray. -Outside window for Paxlovid.

## 2022-04-21 NOTE — ED Triage Notes (Addendum)
Patient brought in via EMS from home. Alert and oriented. Airway patent. Patient c/o shortness of breath that has been intermittent "for a while" but started again and progressively got worse yesterday. Patient wears 2L of 02 via [place] at all times and uses inhalers and neb treatments. Per patient hx of COPD and CHF. Patient denies cough, chest pain, or fever but reports swelling in feet. O2 sat 80% with exertion per paramedic.

## 2022-04-21 NOTE — Assessment & Plan Note (Signed)
O2 sats down to 80s on baseline 2 L with exertion.  Likely due to CHF exacerbation.
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
## 2022-04-21 NOTE — Assessment & Plan Note (Signed)
Rate controlled but not on rate limiting meds. On anticoagulation with Eliquis. -Resume Eliquis

## 2022-04-21 NOTE — ED Notes (Signed)
Pt given something to eat & drink at this time.
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
## 2022-04-21 NOTE — Assessment & Plan Note (Signed)
Initial concern for COPD exacerbation in ED, given steroids, bronchodilators.  On my eval chest exam is clear-without wheezing or rhonchi.  Recent COVID infection. -DuoNebs as needed and scheduled for now -Mucolytic's as needed -Hold off on further steroids for now

## 2022-04-21 NOTE — Assessment & Plan Note (Addendum)
2+ bilateral lower extremity edema, chest x-ray showing pulmonary vascular congestion, BNP elevated at 745.  EF per Care Everywhere 35 to 40%.  Follows with cardiology at [place].  He is supposed to be on torsemide 40 mg daily. -Obtain echocardiogram -IV Lasix 40 twice daily -Strict input output, daily weights. -Resume spironolactone, metoprolol - Trops x 2 -Hold Farxiga while inpatient

## 2022-04-21 NOTE — ED Provider Notes (Signed)
[facility] EMERGENCY DEPARTMENT Provider Note   CSN: [id number] Arrival date & time: 04/21/22  1522     History {Add pertinent medical, surgical, social history, OB history to HPI:1} Chief Complaint  Patient presents with   Shortness of Breath    Matthew Campos is a 82 y.o. male.  Patient has history of COPD and heart failure.  He also has positive with COVID 9 days ago.  He is becoming more short of breath and hypoxic   Shortness of Breath      Home Medications Prior to Admission medications   Medication Sig Start Date End Date Taking? Authorizing Provider  albuterol (PROVENTIL) (2.5 MG/3ML) 0.083% nebulizer solution Take 2.5 mg by nebulization every 6 (six) hours as needed for wheezing or shortness of breath.    [provider]  albuterol (VENTOLIN HFA) 108 (90 Base) MCG/ACT inhaler INHALE 2 PUFFS INTO THE LUNGS EVERY 6 HOURS AS NEEDED FOR WHEEZING OR SHORTNESS OF BREATH 04/18/22   Ronnie Doss M, DO  amoxicillin-clavulanate (AUGMENTIN) 875-125 MG tablet Take 1 tablet by mouth 2 (two) times daily. 04/05/22   [name], DO  apixaban (ELIQUIS) 2.5 MG TABS tablet Take 2.5 mg by mouth 2 (two) times daily.    [provider]  aspirin EC 81 MG tablet Take 81 mg by mouth daily.    [provider]  azelastine (ASTELIN) 0.1 % nasal spray USE 1 SPRAY IN EACH NOSTRIL TWICE DAILY 03/19/22   Ronnie Doss M, DO  Blood Glucose Monitoring Suppl (BLOOD GLUCOSE MONITOR SYSTEM) w/Device KIT 1 Device by Does not apply route 2 (two) times daily. Dx E11.9 05/08/21   Ronnie Doss M, DO  calcium carbonate (OSCAL) 1500 (600 Ca) MG TABS tablet Take by mouth 2 (two) times daily with a meal.    [provider]  clotrimazole-betamethasone (LOTRISONE) cream APPLY TWICE DAILY TO TORSO FOR UP TO 14 DAYS 04/19/22   Gottschalk, Ashly M, DO  dextromethorphan-guaiFENesin (MUCINEX DM) 30-600 MG 12hr tablet Take 1 tablet by mouth 2 (two) times daily. 10/25/20    Ivy Lynn, NP  donepezil (ARICEPT) 5 MG tablet TAKE TWO TABLETS BY MOUTH DAILY 04/18/22   Genia Harold, MD  FARXIGA 10 MG TABS tablet TAKE ONE TABLET EACH MORNING BEFORE BREAKFAST 03/19/22   Ronnie Doss M, DO  ferrous sulfate 325 (65 FE) MG EC tablet Take 325 mg by mouth every Monday, Wednesday, and Friday.  06/18/19   [provider]  glipiZIDE (GLUCOTROL) 5 MG tablet TAKE 1 TABLET BY MOUTH TWICE DAILY BEFORE MEALS 02/20/22   Ronnie Doss M, DO  glucose blood (ONETOUCH VERIO) test strip Check BS BID Dx E11.9 11/29/21   Ronnie Doss M, DO  ipratropium (ATROVENT) 0.03 % nasal spray Place 2 sprays into both nostrils every 12 (twelve) hours for 3 days. 09/14/21 09/17/21  Loman Brooklyn, FNP  Lancet Device MISC Use to check BS BID Dx E11.9 05/08/21   Ronnie Doss M, DO  Lancets [facility] DELICA PLUS [id number]) MISC Check BS BID Dx E11.9 11/29/21   Ronnie Doss M, DO  levothyroxine (SYNTHROID) 100 MCG tablet TAKE ONE (1) TABLET EACH DAY 04/18/22   Gottschalk, Leatrice Jewels M, DO  lidocaine (LIDODERM) 5 % APPLY 1 PATCH AND LEAVE ON FOR 12 HOURS THEN OFF FOR 12 HOURS 06/18/21   Gottschalk, Leatrice Jewels M, DO  loperamide (IMODIUM) 2 MG capsule TAKE 2 CAPSULES BY MOUTH AS NEEDED FOR DIARRHEA/LOOSE STOOL. FOLLOWED BY 1 CAPSULE AFTER EACH LOOSE STOOL (MAX  OF 8 CAPSULES DAILY) 04/19/22   Ronnie Doss M, DO  losartan (COZAAR) 25 MG tablet Take 12.5 mg by mouth daily.    [provider]  lubiprostone (AMITIZA) 24 MCG capsule TAKE 1 CAPSULE TWICE A DAY WITH MEALS. 01/24/22   [name], DO  methocarbamol (ROBAXIN) 500 MG tablet TAKE 1/2 TO 1 TABLET BY MOUTH TWICE DAILY AS NEEDED FOR MUSCLE SPASMS 04/05/22   Ronnie Doss M, DO  metoprolol succinate (TOPROL-XL) 25 MG 24 hr tablet Take 0.5 tablets (12.5 mg total) by mouth daily. 02/20/22   Claretta Fraise, MD  mirtazapine (REMERON) 15 MG tablet TAKE ONE TABLET BY MOUTH AT BEDTIME 11/28/21   Chevis Pretty, FNP  Multiple  Vitamin (MULTIVITAMIN WITH MINERALS) TABS Take 1 tablet by mouth daily.    [provider]  nitroGLYCERIN (NITROSTAT) 0.4 MG SL tablet DISSOLVE 1 TAB UNDER TOUNGE FOR CHEST PAIN. MAY REPEAT EVERY 5 MINUTES FOR 3 DOSES. IF NO RELIEF CALL 911 OR GO TO ER 02/20/22   Ronnie Doss M, DO  ondansetron (ZOFRAN-ODT) 4 MG disintegrating tablet TAKE ONE TABLET BY MOUTH EVERY EIGHT HOURS AS NEEDED 04/19/22   Ronnie Doss M, DO  pantoprazole (PROTONIX) 40 MG tablet TAKE ONE (1) TABLET EACH DAY 04/19/22   Ronnie Doss M, DO  rosuvastatin (CRESTOR) 10 MG tablet TAKE ONE (1) TABLET EACH DAY 02/20/22   Gottschalk, Leatrice Jewels M, DO  RYBELSUS 7 MG TABS TAKE ONE CAPSULE BY MOUTH DAILY 03/19/22   Ronnie Doss M, DO  sodium chloride (OCEAN) 0.65 % nasal spray Place into the nose.    [provider]  spironolactone (ALDACTONE) 25 MG tablet Take 12.5 mg by mouth daily. 05/15/19 09/07/19  [provider]  SYMBICORT 80-4.5 MCG/ACT inhaler Inhale 2 puffs into the lungs 2 (two) times daily as needed. 11/06/21   [name], DO  torsemide (DEMADEX) 20 MG tablet TAKE TWO TABLETS BY MOUTH TWICE DAILY Patient taking differently: Take 40 mg by mouth daily. 01/24/22   [name], DO      Allergies    Codeine, Latex, Morphine, and Lisinopril    Review of Systems   Review of Systems  Respiratory:  Positive for shortness of breath.     Physical Exam Updated Vital Signs BP (!) 112/99   Pulse 81   Temp 98.7 F (37.1 C) (Oral)   Resp (!) 34   Ht '5\' 6"'$  (1.676 m)   Wt 90.7 kg   SpO2 93%   BMI 32.28 kg/m  Physical Exam  ED Results / Procedures / Treatments   Labs (all labs ordered are listed, but only abnormal results are displayed) Labs Reviewed  RESP PANEL BY RT-PCR (RSV, FLU A&B, COVID)  RVPGX2 - Abnormal; Notable for the following components:      Result Value   SARS Coronavirus 2 by RT PCR POSITIVE (*)    All other components within normal limits  CBC WITH  DIFFERENTIAL/PLATELET - Abnormal; Notable for the following components:   RBC 3.98 (*)    Hemoglobin 12.9 (*)    MCV 102.8 (*)    RDW 15.6 (*)    Abs Immature Granulocytes 0.27 (*)    All other components within normal limits  COMPREHENSIVE METABOLIC PANEL - Abnormal; Notable for the following components:   Chloride 97 (*)    Glucose, Bld 145 (*)    Creatinine, Ser 1.74 (*)    GFR, Estimated 39 (*)    All other components within normal limits  EKG None  Radiology DG Chest Port 1 View  Result Date: 04/21/2022 CLINICAL DATA:  Shortness of breath. EXAM: PORTABLE CHEST 1 VIEW COMPARISON:  March 01, 2022 FINDINGS: Probable mild cardiomegaly. The hila and mediastinum are normal. No pneumothorax. Interstitial prominence. Blunting left costophrenic angle. No nodule, mass, or focal infiltrate. IMPRESSION: 1. Cardiomegaly and pulmonary venous congestion. 2. Blunting of the left costophrenic angle may represent a small effusion or pleural thickening. Electronically Signed   By: Dorise Bullion III M.D.   On: 04/21/2022 16:00    Procedures Procedures  {Document cardiac monitor, telemetry assessment procedure when appropriate:1}  Medications Ordered in ED Medications  ipratropium-albuterol (DUONEB) 0.5-2.5 (3) MG/3ML nebulizer solution 3 mL (3 mLs Nebulization Given 04/21/22 1624)  albuterol (PROVENTIL) (2.5 MG/3ML) 0.083% nebulizer solution 2.5 mg (2.5 mg Nebulization Given 04/21/22 1624)  methylPREDNISolone sodium succinate (SOLU-MEDROL) 125 mg/2 mL injection 125 mg (125 mg Intravenous Given 04/21/22 1627)  magnesium sulfate IVPB 2 g 50 mL (2 g Intravenous New Bag/Given 04/21/22 1631)    ED Course/ Medical Decision Making/ A&P                           Medical Decision Making Amount and/or Complexity of Data Reviewed Labs: ordered. Radiology: ordered.  Risk Prescription drug management. Decision regarding hospitalization.   Patient is being admitted for worsening hypoxia with COPD  heart failure and COVID  {Document critical care time when appropriate:1} {Document review of labs and clinical decision tools ie heart score, Chads2Vasc2 etc:1}  {Document your independent review of radiology images, and any outside records:1} {Document your discussion with family members, caretakers, and with consultants:1} {Document social determinants of health affecting pt's care:1} {Document your decision making why or why not admission, treatments were needed:1} Final Clinical Impression(s) / ED Diagnoses Final diagnoses:  COPD exacerbation ([place])  Hypoxia    Rx / DC Orders ED Discharge Orders     None
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
## 2022-04-21 NOTE — Assessment & Plan Note (Signed)
A1c 8.8. - SSI- S -Hold Farxiga, glipizide

## 2022-04-22 ENCOUNTER — Inpatient Hospital Stay (HOSPITAL_COMMUNITY): Payer: 59

## 2022-04-22 ENCOUNTER — Other Ambulatory Visit (HOSPITAL_COMMUNITY): Payer: Self-pay | Admitting: *Deleted

## 2022-04-22 DIAGNOSIS — I480 Paroxysmal atrial fibrillation: Secondary | ICD-10-CM

## 2022-04-22 DIAGNOSIS — G4733 Obstructive sleep apnea (adult) (pediatric): Secondary | ICD-10-CM

## 2022-04-22 DIAGNOSIS — Z952 Presence of prosthetic heart valve: Secondary | ICD-10-CM | POA: Diagnosis not present

## 2022-04-22 DIAGNOSIS — E1142 Type 2 diabetes mellitus with diabetic polyneuropathy: Secondary | ICD-10-CM | POA: Diagnosis not present

## 2022-04-22 DIAGNOSIS — Z9981 Dependence on supplemental oxygen: Secondary | ICD-10-CM

## 2022-04-22 DIAGNOSIS — J9621 Acute and chronic respiratory failure with hypoxia: Secondary | ICD-10-CM | POA: Diagnosis not present

## 2022-04-22 DIAGNOSIS — N1832 Chronic kidney disease, stage 3b: Secondary | ICD-10-CM

## 2022-04-22 DIAGNOSIS — I5023 Acute on chronic systolic (congestive) heart failure: Secondary | ICD-10-CM | POA: Diagnosis not present

## 2022-04-22 DIAGNOSIS — J449 Chronic obstructive pulmonary disease, unspecified: Secondary | ICD-10-CM | POA: Diagnosis not present

## 2022-04-22 LAB — ECHOCARDIOGRAM COMPLETE
AR max vel: 2.88 cm2
AV Area VTI: 2.84 cm2
AV Area mean vel: 2.63 cm2
AV Mean grad: 11 mmHg
AV Peak grad: 18.9 mmHg
Ao pk vel: 2.18 m/s
Area-P 1/2: 5.42 cm2
Calc EF: 46.1 %
Height: 66 in
MV M vel: 4.4 m/s
MV Peak grad: 77.4 mmHg
S' Lateral: 4.3 cm
Single Plane A2C EF: 55.5 %
Single Plane A4C EF: 38.5 %
Weight: 3315.72 oz

## 2022-04-22 LAB — BASIC METABOLIC PANEL
Anion gap: 10 (ref 5–15)
BUN: 23 mg/dL (ref 8–23)
CO2: 27 mmol/L (ref 22–32)
Calcium: 9.1 mg/dL (ref 8.9–10.3)
Chloride: 98 mmol/L (ref 98–111)
Creatinine, Ser: 1.6 mg/dL — ABNORMAL HIGH (ref 0.61–1.24)
GFR, Estimated: 43 mL/min — ABNORMAL LOW (ref >60)
Glucose, Bld: 187 mg/dL — ABNORMAL HIGH (ref 70–99)
Potassium: 4.5 mmol/L (ref 3.5–5.1)
Sodium: 135 mmol/L (ref 135–145)

## 2022-04-22 LAB — CBC
HCT: 37.9 % — ABNORMAL LOW (ref 39.0–52.0)
Hemoglobin: 12 g/dL — ABNORMAL LOW (ref 13.0–17.0)
MCH: 32.1 pg (ref 26.0–34.0)
MCHC: 31.7 g/dL (ref 30.0–36.0)
MCV: 101.3 fL — ABNORMAL HIGH (ref 80.0–100.0)
Platelets: 170 10*3/uL (ref 150–400)
RBC: 3.74 MIL/uL — ABNORMAL LOW (ref 4.22–5.81)
RDW: 15.6 % — ABNORMAL HIGH (ref 11.5–15.5)
WBC: 4.7 10*3/uL (ref 4.0–10.5)
nRBC: 0 % (ref 0.0–0.2)

## 2022-04-22 LAB — GLUCOSE, CAPILLARY
Glucose-Capillary: 210 mg/dL — ABNORMAL HIGH (ref 70–99)
Glucose-Capillary: 237 mg/dL — ABNORMAL HIGH (ref 70–99)
Glucose-Capillary: 140 mg/dL — ABNORMAL HIGH (ref 70–99)
Glucose-Capillary: 143 mg/dL — ABNORMAL HIGH (ref 70–99)

## 2022-04-22 MED ORDER — HALOPERIDOL LACTATE 5 MG/ML IJ SOLN
1.0000 mg | Freq: Three times a day (TID) | INTRAMUSCULAR | Status: DC | PRN
  Administered 2022-04-22 – 2022-04-24 (×3): 1 mg via INTRAMUSCULAR
  Filled 2022-04-22 (×3): qty 1

## 2022-04-22 NOTE — Assessment & Plan Note (Signed)
-  Continue patient follow-up with cardiology service.

## 2022-04-22 NOTE — Progress Notes (Incomplete)
  Echocardiogram 2D Echocardiogram has been performed.  Matthew Campos 04/22/2022, 11:50 AM

## 2022-04-22 NOTE — Assessment & Plan Note (Signed)
-  Patient with chronic respiratory failure using at baseline 2 L nasal cannula supplementation. -Currently requiring 4 L in the setting of CHF exacerbation -Continue to wean off oxygen supplementation as tolerated.

## 2022-04-22 NOTE — Assessment & Plan Note (Signed)
-  Appears to be stable -Continue to follow close monitoring in the setting of acute diuresis -Minimize nephrotoxic agents and avoid hypotension.

## 2022-04-22 NOTE — Progress Notes (Signed)
PT Cancellation Note  Patient Details Name: TYRIK STETZER MRN: [id number] DOB: January 26, 1941   Cancelled Treatment:    Reason Eval/Treat Not Completed: PT screened, no needs identified, will sign off.  Patient ambulating in room independently using his cane PRN.  2:01 PM, 04/22/22 Lonell Grandchild, MPT Physical Therapist with [facility] [phone] office [phone] mobile phone
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
## 2022-04-23 DIAGNOSIS — E1142 Type 2 diabetes mellitus with diabetic polyneuropathy: Secondary | ICD-10-CM | POA: Diagnosis not present

## 2022-04-23 DIAGNOSIS — Z952 Presence of prosthetic heart valve: Secondary | ICD-10-CM | POA: Diagnosis not present

## 2022-04-23 DIAGNOSIS — J9621 Acute and chronic respiratory failure with hypoxia: Secondary | ICD-10-CM | POA: Diagnosis not present

## 2022-04-23 DIAGNOSIS — J449 Chronic obstructive pulmonary disease, unspecified: Secondary | ICD-10-CM | POA: Diagnosis not present

## 2022-04-23 LAB — BASIC METABOLIC PANEL
Anion gap: 8 (ref 5–15)
BUN: 33 mg/dL — ABNORMAL HIGH (ref 8–23)
CO2: 31 mmol/L (ref 22–32)
Calcium: 8.9 mg/dL (ref 8.9–10.3)
Chloride: 98 mmol/L (ref 98–111)
Creatinine, Ser: 1.54 mg/dL — ABNORMAL HIGH (ref 0.61–1.24)
GFR, Estimated: 45 mL/min — ABNORMAL LOW (ref >60)
Glucose, Bld: 117 mg/dL — ABNORMAL HIGH (ref 70–99)
Potassium: 3.8 mmol/L (ref 3.5–5.1)
Sodium: 137 mmol/L (ref 135–145)

## 2022-04-23 LAB — GLUCOSE, CAPILLARY
Glucose-Capillary: 127 mg/dL — ABNORMAL HIGH (ref 70–99)
Glucose-Capillary: 177 mg/dL — ABNORMAL HIGH (ref 70–99)
Glucose-Capillary: 163 mg/dL — ABNORMAL HIGH (ref 70–99)
Glucose-Capillary: 127 mg/dL — ABNORMAL HIGH (ref 70–99)

## 2022-04-23 LAB — VITAMIN B12: Vitamin B-12: 294 pg/mL (ref 180–914)

## 2022-04-23 MED ORDER — VITAMIN B-12 1000 MCG PO TABS
1000.0000 ug | ORAL_TABLET | Freq: Every day | ORAL | Status: DC
  Administered 2022-04-23 – 2022-04-25 (×3): 1000 ug via ORAL
  Filled 2022-04-23 (×3): qty 1

## 2022-04-23 MED ORDER — DIVALPROEX SODIUM 125 MG PO CSDR
125.0000 mg | DELAYED_RELEASE_CAPSULE | Freq: Every day | ORAL | Status: DC
  Administered 2022-04-23 – 2022-04-25 (×3): 125 mg via ORAL
  Filled 2022-04-23 (×3): qty 1

## 2022-04-23 NOTE — Progress Notes (Signed)
Bow c/o chest pain mid sternum gone now see vitals

## 2022-04-23 NOTE — Progress Notes (Signed)
Patient called stating he couldn't breathe checked vitals 99% on 3L O2

## 2022-04-23 NOTE — TOC Progression Note (Addendum)
Transition of Care [facility]) - Progression Note    Patient Details  Name: Matthew Campos MRN: [id number] Date of Birth: 07/10/1940  Transition of Care [facility]) CM/SW Contact  Boneta Lucks, RN Phone Number: 04/23/2022, 12:36 PM  Clinical Narrative:   CM spoke with wife earlier this morning, she wants conversion to be with her or her son, [name]. CM discuss case with supervisor for difficult to place.  Suggested sending referral to [facility], CM contacted Loree Fee and gave her [name] and [place] phone number. CM spoke with [name] as well for him to expect her call. DC planning for 2 days.   Addendum : Loree Fee called back, they are unable to help place until patient has medicaid, She thinks he and his wife will qualify. She suggested [name] take some time off and go to [place] office. TOC reaching out to our Development worker, community for assistance. They will reach out to [place].   Expected Discharge Plan: [place] Barriers to Discharge: Continued Medical Work up  Expected Discharge Plan and Services      Living arrangements for the past 2 months: Single Family Home                    Social Determinants of Health (SDOH) Interventions SDOH Screenings   Food Insecurity: No Food Insecurity (04/22/2022)  Housing: Low Risk  (04/22/2022)  Transportation Needs: No Transportation Needs (04/22/2022)  Utilities: Not At Risk (04/22/2022)  Alcohol Screen: Low Risk  (07/27/2021)  Depression (PHQ2-9): High Risk (04/05/2022)  Financial Resource Strain: Low Risk  (07/26/2020)  Physical Activity: Inactive (09/17/2021)  Social Connections: Moderately Isolated (07/26/2020)  Stress: Stress Concern Present (09/17/2021)  Tobacco Use: Medium Risk (04/21/2022)    Readmission Risk Interventions    09/08/2019    2:55 PM  Readmission Risk Prevention Plan  Transportation Screening Complete  PCP or Specialist Appt within 3-5 Days Not Complete  HRI or [place] Complete  Social Work Consult for  [place] Planning/Counseling Complete  Palliative Care Screening Not Applicable  Medication Review Press photographer) Complete
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
## 2022-04-23 NOTE — Progress Notes (Signed)
Patient up intermittently through night incontinent of bladder, full bed linen changed twice. Patient currently resting quietly in bed with fall precautions in place and call bell and urinal within reach.

## 2022-04-24 DIAGNOSIS — J9621 Acute and chronic respiratory failure with hypoxia: Secondary | ICD-10-CM | POA: Diagnosis not present

## 2022-04-24 LAB — BASIC METABOLIC PANEL
Anion gap: 8 (ref 5–15)
BUN: 35 mg/dL — ABNORMAL HIGH (ref 8–23)
CO2: 29 mmol/L (ref 22–32)
Calcium: 8.8 mg/dL — ABNORMAL LOW (ref 8.9–10.3)
Chloride: 100 mmol/L (ref 98–111)
Creatinine, Ser: 1.35 mg/dL — ABNORMAL HIGH (ref 0.61–1.24)
GFR, Estimated: 53 mL/min — ABNORMAL LOW (ref >60)
Glucose, Bld: 112 mg/dL — ABNORMAL HIGH (ref 70–99)
Potassium: 3.9 mmol/L (ref 3.5–5.1)
Sodium: 137 mmol/L (ref 135–145)

## 2022-04-24 LAB — GLUCOSE, CAPILLARY
Glucose-Capillary: 220 mg/dL — ABNORMAL HIGH (ref 70–99)
Glucose-Capillary: 179 mg/dL — ABNORMAL HIGH (ref 70–99)
Glucose-Capillary: 200 mg/dL — ABNORMAL HIGH (ref 70–99)
Glucose-Capillary: 130 mg/dL — ABNORMAL HIGH (ref 70–99)

## 2022-04-24 NOTE — Progress Notes (Signed)
Progress Note   Patient: Matthew Campos GNF:[id number] DOB: 08-31-1940 DOA: 04/21/2022     3 DOS: the patient was seen and examined on 04/24/2022   Brief hospital course: As per H&P written by Dr. Denton Brick on 04/21/2022 [name] is a 82 y.o. 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 Patient was brought to the ED via EMS reports of difficulty breathing.  Was diagnosed with COVID 9 days ago.  Paramedics report O2 sats down to 80% on 2 L with activity. My evaluation, patient is awake alert and oriented, does report history from likely because of his dementia.  Tells me he has been having difficulty breathing for years, is unsure if it is worse, also unable to tell me how long he has had bilateral lower extremity swelling. Denies chest pain.   Unable to reach patient's spouse on the phone.   Per Triage notes, family is unable to continue to care for patient, report patient has become increasingly combative, and has to use his cane to his multiple family members to the point APS has been involved.   ED Course: Per ED provider, O2 sats down to 80s on 2 L with exertion.  Respiratory rate 18-34.  Heart rate 78-100.  Tmax 98.1.  COVID Positive. Chest x-ray showing cardiomegaly and pulmonary vascular congestion. DuoNebs, magnesium, Solu-Medrol 125 mg given. Hospitalist to admit for acute on chronic respiratory failure.  Assessment and Plan: * Acute on chronic respiratory failure with hypoxia (HCC) -O2 sats down to 80s on baseline 2 L with exertion as per EMS report.  04/24/22 -Patient has been weaned off oxygen -Ambulating around the room without significant desaturation at this time  COVID-19 virus infection -Diagnosed 10 days ago.   -COVID test positive at time of admission.   -Acute on chronic respiratory failure with hypoxia likely secondary to pulmonary vascular  congestion and acute on chronic CHF. -Outside window for Paxlovid or any other antiviral treatment for COVID. -Patient is afebrile; continue as needed mucolytic's/antitussive medications and supportive care. -Respiratory status has improved significantly hypoxia resolved as above #1   Acute on chronic systolic CHF (congestive heart failure) (HCC) -2+ bilateral lower extremity edema, chest x-ray showing pulmonary vascular congestion and BNP elevated at 745.   -Most recent EF per Care Everywhere 35 to 40%.  Follows with cardiology at [place] daily weights, strict intake and output and low-sodium diet -Continue IV diuresis -Continue spironolactone and metoprolol  Alzheimer's dementia without behavioral disturbance ([place]) - 04/24/22 Cooperative, -Not combative -As needed Haldol will be provided -Continue supportive care and constant reorientation -Continue Aricept and start daily low-dose of Depakote.  CKD (chronic kidney disease), stage III (HCC) -Appears to be stable -Continue to follow close monitoring in the setting of acute diuresis -Minimize nephrotoxic agents and avoid hypotension.  OSA (obstructive sleep apnea) -Continue CPAP nightly (unsure of patient compliant with underlying dementia).  Paroxysmal atrial fibrillation (HCC) -Rate controlled but not on rate limiting meds. -Continue Eliquis. -Outpatient discussion regarding long-term anticoagulation given high risk for falls with underlying dementia.  COPD (chronic obstructive pulmonary disease) ([place]) -Initial with concerns for COPD exacerbation in ED, given steroids and bronchodilators.   -Patient has remained with wheezing and is speaking in full sentences -Continue holding oral steroid -Continue treatment for CHF exacerbation -Continue bronchodilator management.    S/P TAVR (transcatheter aortic valve replacement) -Continue patient follow-up with cardiology service.  Oxygen dependent -Patient with  chronic respiratory failure using at baseline 2 L nasal cannula supplementation. -Currently requiring 3 L in the setting of CHF exacerbation -Continue to wean off oxygen supplementation as tolerated.  Type 2 diabetes mellitus (HCC) -A1c 8.8. -Continue sliding scale insulin and Semglee -Follow CBGs fluctuation and adjust medication as needed.  Disposition-CHF improving hypoxia has resolved -Respiratory symptoms from COVID improving -May be able to discharge over the next 24 hours, family to determine if patient can go home versus SNF rehab PT eval requested    Subjective:  -Weaned off oxygen resting comfortably ambulating around the room -Walking around and cooperative and noncombative -Actually pretty pleasant - Disposition-CHF improving hypoxia has resolved -Respiratory symptoms from [place] improving -May be able to discharge over the next 24 hours, family to determine if patient can go home versus SNF rehab PT eval requested  Physical Exam: Vitals:   04/24/22 0500 04/24/22 0534 04/24/22 1000 04/24/22 1300  BP:  (!) 94/44 (!) 118/96 (!) 107/55  Pulse:  70 76 72  Resp:  18    Temp:  98.2 F (36.8 C)  98 F (36.7 C)  TempSrc:    Oral  SpO2:  100%  92%  Weight: 88.8 kg     Height:        Physical Exam  Gen:- Awake Alert, in no acute distress , cooperative and easily redirectable HEENT:- [place].AT, No sclera icterus Neck-Supple Neck,No JVD,.  Lungs-  CTAB , fair air movement bilaterally  CV- S1, S2 normal, RRR Abd-  +ve B.Sounds, Abd Soft, No tenderness,    Extremity/Skin:- No  edema,   good pedal pulses  Psych-affect is appropriate, oriented x3, family reports cognitive and memory deficits but overall patient is redirectable and cooperative Neuro-no new focal deficits, no tremors   Family Communication: No family at bedside.  Disposition: Status is: Inpatient Remains inpatient appropriate because:  -Continue treatment with IV diuresis for management of acute on  chronic heart failure.   Planned Discharge Destination:  To be determined   Author: Roxan Hockey, MD 04/24/2022 4:26 PM  For on call review [email or web address].

## 2022-04-24 NOTE — Progress Notes (Signed)
PT Cancellation Note  Patient Details Name: Matthew Campos MRN: [id number] DOB: 20-Nov-1940   Cancelled Treatment:    Reason Eval/Treat Not Completed: PT screened, no needs identified, will sign off.  Patient observed ambulating in room/hallway using his cane Modified Independent.    1:48 PM, 04/24/22 Lonell Grandchild, MPT Physical Therapist with [facility] [phone] office [phone] mobile phone

## 2022-04-24 NOTE — Progress Notes (Signed)
Patient slept through the night. No complaints of pain. Continued to monitor.

## 2022-04-24 NOTE — TOC Progression Note (Signed)
Transition of Care [facility]) - Progression Note    Patient Details  Name: Matthew Campos MRN: [id number] Date of Birth: May 18, 1940  Transition of [facility]) CM/SW Contact  Boneta Lucks, RN Phone Number: 04/24/2022, 1:41 PM  Clinical Narrative:   Patient walking in hall with walker without assistance. PT assisted patient back to room with min guard.     Expected Discharge Plan: [place] Barriers to Discharge: Continued Medical Work up  Expected Discharge Plan and [place] arrangements for the past 2 months: [place]

## 2022-04-25 DIAGNOSIS — J9621 Acute and chronic respiratory failure with hypoxia: Secondary | ICD-10-CM | POA: Diagnosis not present

## 2022-04-25 LAB — GLUCOSE, CAPILLARY
Glucose-Capillary: 200 mg/dL — ABNORMAL HIGH (ref 70–99)
Glucose-Capillary: 213 mg/dL — ABNORMAL HIGH (ref 70–99)

## 2022-04-25 MED ORDER — SYMBICORT 80-4.5 MCG/ACT IN AERO: 2.0000 | INHALATION_SPRAY | Freq: Two times a day (BID) | RESPIRATORY_TRACT | 2 refills | Status: DC

## 2022-04-25 MED ORDER — PANTOPRAZOLE SODIUM 40 MG PO TBEC: 40.0000 mg | DELAYED_RELEASE_TABLET | Freq: Every day | ORAL | 3 refills | Status: DC

## 2022-04-25 MED ORDER — DONEPEZIL HCL 5 MG PO TABS: 10.0000 mg | ORAL_TABLET | Freq: Every day | ORAL | 0 refills | Status: DC

## 2022-04-25 MED ORDER — APIXABAN 2.5 MG PO TABS: 2.5000 mg | ORAL_TABLET | Freq: Two times a day (BID) | ORAL | 3 refills | Status: DC

## 2022-04-25 MED ORDER — GLIPIZIDE 5 MG PO TABS: 5.0000 mg | ORAL_TABLET | Freq: Two times a day (BID) | ORAL | 4 refills | Status: DC

## 2022-04-25 MED ORDER — ALBUTEROL SULFATE HFA 108 (90 BASE) MCG/ACT IN AERS: 2.0000 | INHALATION_SPRAY | Freq: Four times a day (QID) | RESPIRATORY_TRACT | 2 refills | Status: DC | PRN

## 2022-04-25 MED ORDER — LOSARTAN POTASSIUM 25 MG PO TABS: 12.5000 mg | ORAL_TABLET | Freq: Every day | ORAL | 4 refills | Status: DC

## 2022-04-25 MED ORDER — RISPERIDONE 1 MG PO TABS: 1.0000 mg | ORAL_TABLET | Freq: Every day | ORAL | 2 refills | Status: DC

## 2022-04-25 MED ORDER — TORSEMIDE 20 MG PO TABS: 40.0000 mg | ORAL_TABLET | Freq: Every day | ORAL | 2 refills | Status: DC

## 2022-04-25 MED ORDER — RISPERIDONE 1 MG PO TABS: 1.0000 mg | ORAL_TABLET | Freq: Every day | ORAL | Status: DC

## 2022-04-25 MED ORDER — RISPERIDONE 0.5 MG PO TABS
0.5000 mg | ORAL_TABLET | Freq: Every day | ORAL | Status: DC
  Administered 2022-04-25: 0.5 mg via ORAL
  Filled 2022-04-25: qty 1

## 2022-04-25 MED ORDER — RISPERIDONE 0.5 MG PO TABS: 0.5000 mg | ORAL_TABLET | Freq: Every day | ORAL | 2 refills | Status: DC

## 2022-04-25 MED ORDER — ALBUTEROL SULFATE (2.5 MG/3ML) 0.083% IN NEBU: 2.5000 mg | INHALATION_SOLUTION | RESPIRATORY_TRACT | 12 refills | Status: DC | PRN

## 2022-04-25 MED ORDER — FERROUS SULFATE 325 (65 FE) MG PO TBEC: 325.0000 mg | DELAYED_RELEASE_TABLET | ORAL | 3 refills | Status: DC

## 2022-04-25 MED ORDER — METOPROLOL SUCCINATE ER 25 MG PO TB24: 12.5000 mg | ORAL_TABLET | Freq: Every day | ORAL | 2 refills | Status: DC

## 2022-04-25 MED ORDER — SPIRONOLACTONE 25 MG PO TABS: 12.5000 mg | ORAL_TABLET | Freq: Every day | ORAL | 5 refills | Status: DC

## 2022-04-25 NOTE — TOC Transition Note (Signed)
Transition of Care [facility]) - CM/SW Discharge Note   Patient Details  Name: Matthew Campos MRN: [id number] Date of Birth: 1940/07/19  Transition of [facility]) CM/SW Contact:  Boneta Lucks, RN Phone Number: 04/25/2022, 10:46 AM   Clinical Narrative:   Patient medically ready for discharge. CM called wife, she will have a friend come to get him. Confirmed they will bring home oxygen to transport. He has home oxygen provided by [place]. Discussed getting home health social worker to continue to assist family with placement. No preferences, Caryl Pina with [facility] accept the referral. Added to AVS.   Final next level of care: [place] Barriers to Discharge: Barriers Resolved   Patient Goals and CMS Choice CMS Medicare.gov Compare Post Acute Care list provided to:: Patient Choice offered to / list presented to : Patient  Discharge Placement          Patient to be transferred to facility by: Neighbor Name of family member notified: [name] Patient and family notified of of transfer: 04/25/22  Discharge Plan and Services Additional resources added to the After Visit Summary for      [facility] Arranged: Social Work [facility] Agency: Microbiologist ([place]) Date [place]: 04/25/22 Time [place]: 7867 Representative spoke with at [place]: [place] ([place]) Interventions SDOH Screenings   Food Insecurity: No Food Insecurity (04/22/2022)  Housing: Low Risk  (04/22/2022)  Transportation Needs: No Transportation Needs (04/22/2022)  Utilities: Not At Risk (04/22/2022)  Alcohol Screen: Low Risk  (07/27/2021)  Depression (PHQ2-9): High Risk (04/05/2022)  Financial Resource Strain: Low Risk  (07/26/2020)  Physical Activity: Inactive (09/17/2021)  Social Connections: Moderately Isolated (07/26/2020)  Stress: Stress Concern Present (09/17/2021)  Tobacco Use: Medium Risk (04/21/2022)   Readmission Risk Interventions    09/08/2019     2:55 PM  Readmission Risk Prevention Plan  Transportation Screening Complete  PCP or Specialist Appt within 3-5 Days Not Complete  HRI or [place] Complete  Social Work Consult for [place] Planning/Counseling Complete  Palliative Care Screening Not Applicable  Medication Review Press photographer) Complete

## 2022-04-25 NOTE — Progress Notes (Deleted)
Cardiology Office Note   Date:  04/25/2022   ID:  ARVLE GRABE, DOB 05-27-1940, MRN [id number]  PCP:  [name], DO  Cardiologist:   None Referring:  ***  No chief complaint on file.     History of Present Illness: Matthew Campos is a 82 y.o. male who presents with a complicated cardiac history.  I last saw him in Dec 2020.  ***   He was admitted to [facility] in [place] for SOB.  I reviewed these records for this visit.   He has a medical history including HFrEF (LVEF 35-40%), chronic AF with slow ventricular response s/p Micra leadless pacemaker on Eliquis, severe AS s/p TAVR 26 mm sapien 2019, CAD s/p multiple PCI, AAA s/p stenting 2011 followed by vascular, HTN, hypercholesterolemia, DM2 who presented to [facility] ED with c/o SOB.     ***Below is a summary of the hospital course.  On 10/28, the patient was admitted to the cardiology service for concern of HF exacerbation with SOB while in the ED with worsening lower extremity swelling. The patient's chronic medical conditions were treated accordingly per the patient's home medication regimen.  Over the course of his admission the pt was continually diuresed with labs monitored daily. Throughout his admission the pt was net neg 9.4L and weight on admission was 200lbs with discharge weight of 192. Pt will continue on home regimen of Torsemide '40mg'$  PO daily with instructions to take additional dosing with >3lbs weight gain in 24 hours or >5lbs in a week period until back to approximate discharge weight.  At the time of discharge, the patient is clinically stable with no new symptoms. Physical examination benign compared to yesterday. Remains afebrial, incision site without drainage, no swelling in extremities. Labs and images reviewed. Telemetry showed V-paced rhythm with PVCs. Patient has tolerated PO diet, has ambulated and voided without issues. Discussed DC plans with pt and family. Answered all of their questions. They  agree with discharge instructions. Pt has to come back to ER if symptoms get worse and any new symptoms arises. Follow up appointments as stated below. Patient was seen by myself and discussed with Dr. Philbert Riser who agreed the patient is stable for discharge. Pt is being discharged in stable condition.     EFT VENTRICLE There is normal left ventricular wall thickness. The left ventricle is moderately dilated. Left ventricular systolic function is moderately reduced. LV ejection fraction = 40-45%. Left ventricular filling pattern is indeterminate. There is hypokinesis of the mid and basal inferoseptum.  - RIGHT VENTRICLE The right ventricle is mildly dilated. The right ventricular systolic function is normal.  LEFT ATRIUM The left atrial size is normal.  RIGHT ATRIUM The right atrium is mildly dilated. - AORTIC VALVE Status post TAVR with mild paravalvular regurgitation. Aortic valve: status post TAVR with 29 mm Sapien, well-seated. Mild perivalvular regurgitation (2-3 o'clock), unchanged from prior. Normal forward gradients. - MITRAL VALVE The mitral valve is normal in structure and function. There is mild mitral regurgitation. - TRICUSPID VALVE The tricuspid valve is normal in structure and function. There is trace tricuspid regurgitation. There was insufficient TR detected to calculate RV systolic pressure. - PULMONIC VALVE Trace to mild pulmonic valvular regurgitation. There is no pulmonic valvular stenosis. - ARTERIES The ascending aorta is normal size. - VENOUS Dilated IVC with normal collapsibility consistent with a mildly elevated RA pressure. - EFFUSION There is no pericardial effusion. - -   START taking these medications losartan 25 MG  tablet Commonly known as: COZAAR Dose: 12.5 mg Instructions: Take 0.5 tablets (12.5 mg total) by mouth daily.   CHANGE how you take these medications apixaban 2.5 mg tablet *ANTICOAGULANT* Commonly known as:  ELIQUIS What changed:  medication strength  how much to take Dose: 2.5 mg Instructions: Take 1 tablet (2.5 mg total) by mouth 2 times daily. Indication: treatment to prevent blood clots in chronic atrial fibrillation  metoPROLOL succinate 25 MG 24 hr tablet Commonly known as: TOPROL-XL What changed:  medication strength  how much to take Dose: 25 mg Instructions: Take 1 tablet (25 mg total) by mouth daily.  spironolactone 25 MG tablet Commonly known as: ALDACTONE What changed: See the new instructions. Dose: 12.5 mg Instructions: Take 0.5 tablets (12.5 mg total) by mouth daily.  torsemide 40 mg tablet Commonly known as: SOAANZ What changed:  medication strength  how much to take  how to take this  when to take this  additional instructions Dose: 40 mg Instructions: Take 1 tablet (40 mg total) by mouth daily. Weight yourself daily, if weight gain greater than 3lbs in 24 hours or 5lbs in 1 week, take an additional dose of Torsemide in the afternoon. If you do not see improvement after three days, call PCP for instructions. START Date: February 19, 2022   CONTINUE taking these medications albuterol 2.5 mg /3 mL (0.083 %) nebulizer solution Dose: 2.5 mg Instructions: Inhale 3 mLs (2.5 mg total) by nebulization as needed.  albuterol 90 mcg/actuation inhaler Dose: 2 puff Instructions: Inhale 2 puffs into the lungs as needed for Wheezing or Shortness of Breath.  azelastine 137 mcg (0.1 %) nasal spray Commonly known as: ASTELIN Dose: 1 spray Instructions: 1 spray 2 times daily.  calcium carbonate 600 mg calcium (1,500 mg) tablet Commonly known as: OS-CAL Dose: 1 tablet Instructions: Take 1 tablet (1,500 mg total) by mouth daily.  dapagliflozin propanediol 10 mg tablet Commonly known as: FARXIGA Dose: 10 mg Instructions: Take 1 tablet (10 mg total) by mouth daily.  donepeziL 5 MG tablet Commonly known as: ARICEPT Dose: 10 mg Instructions: Take 2 tablets  (10 mg total) by mouth nightly.  ferrous sulfate 325 (65 FE) MG EC tablet Dose: 325 mg Instructions: Take 1 tablet (325 mg total) by mouth Every Monday, Wednesday, Friday.  glipiZIDE 5 MG tablet Commonly known as: GLUCOTROL Dose: 5 mg Instructions: Take 1 tablet (5 mg total) by mouth 2 times daily before meals.  levothyroxine 100 MCG tablet Commonly known as: SYNTHROID Dose: 100 mcg Instructions: Take 1 tablet (100 mcg total) by mouth Daily at 0600.  lidocaine 5 % patch Commonly known as: LIDODERM Dose: 1 patch Instructions: 1 patch daily.  loperamide 2 mg capsule Commonly known as: IMODIUM-AD  lubiprostone 24 MCG capsule Commonly known as: AMITIZA Dose: 24 mcg Instructions: Take 1 capsule (24 mcg total) by mouth daily as needed for Constipation.  methocarbamoL 500 MG tablet Commonly known as: ROBAXIN Dose: 500 mg Instructions: Take 1 tablet (500 mg total) by mouth daily as needed (for muscle spasms).   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   Abdominal aortic aneurysm without rupture (HCC)    Anemia    Anxiety and depression    Aortic stenosis    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HCC)    CHF (congestive heart failure) (HCC)    Cholecystitis    Chronic ischemic heart disease    Complication of anesthesia  hard to be put to sleep   COPD (chronic obstructive pulmonary disease) ([place])    Coronary artery disease    Diabetes mellitus without complication ([place])    Diabetes mellitus without complication ([place])    takes Januvia and Metformin daily   GERD (gastroesophageal reflux disease)    GERD (gastroesophageal reflux disease)    takes omeprazole daily   Headache(784.0)    sinus   History of bronchitis    last time >72yr ago   Hyperlipidemia    takes Lipitor daily   Hypertension    Hypertension    takes Metoprolol daily   Infected sebaceous cyst 01/30/2021   Joint pain    Myocardial infarction ([place]     x 3;last one about 3-490yrago   Neoplasm of uncertain behavior of bladder    Obesity    OSA (obstructive sleep apnea)    Pacemaker    Pancytopenia (HCC)    [place]oxysmal atrial fibrillation ([place]   Pneumonia    last itme about 9y61yrgo   S/P TAVR (transcatheter aortic valve replacement)    Severe aortic stenosis    Sinus bradycardia     Past Surgical History:  Procedure Laterality Date   abdominal aneurysm stenting     ABDOMINAL AORTIC ANEURYSM REPAIR     per patient stents placed about 4-5 years ago   AORTIC VALVE REPLACEMENT     stated it was done in November   cholecystostomy tube     CORONARY ANGIOPLASTY  2010   CORONARY ANGIOPLASTY WITH STENT PLACEMENT     about 30 years ago per patient   cyst removed from left wrist     ESOPHAGOGASTRODUODENOSCOPY (EGD) WITH PROPOFOL N/A 04/15/2019   Procedure: ESOPHAGOGASTRODUODENOSCOPY (EGD) WITH PROPOFOL;  Surgeon: FieDanie BinderD;  [facility];  Service: Endoscopy;  Laterality: N/A;  possible dilation   HERNIA REPAIR     left shoulder surgery     POLYPECTOMY  04/15/2019   Procedure: POLYPECTOMY;  Surgeon: FieDanie BinderD;  Location: [facility];  Service: Endoscopy;;  duodenal    SHOULDER ARTHROSCOPY WITH ROTATOR CUFF REPAIR AND SUBACROMIAL DECOMPRESSION  04/17/2012   Procedure: SHOULDER ARTHROSCOPY WITH ROTATOR CUFF REPAIR AND SUBACROMIAL DECOMPRESSION;  Surgeon: W DYvette RackMD;  [facility];  Laterality: Right;  RIGHT SHOULDER ROTATOR CUFF REPAIR INCLUDING ACROMIOPLASTY CHRONIC, ARTHROSCOPY SHOULDER DEBRIDEMENT EXTENSIVE   TONSILLECTOMY     VALVE REPLACEMENT       Current Outpatient Medications  Medication Sig Dispense Refill   albuterol (PROVENTIL) (2.5 MG/3ML) 0.083% nebulizer solution Take 3 mLs (2.5 mg total) by nebulization every 4 (four) hours as needed for wheezing or shortness of breath. 75 mL 12   albuterol (VENTOLIN HFA) 108 (90 Base) MCG/ACT inhaler Inhale 2 puffs  into the lungs every 6 (six) hours as needed for wheezing or shortness of breath. 8.5 g 2   apixaban (ELIQUIS) 2.5 MG TABS tablet Take 1 tablet (2.5 mg total) by mouth 2 (two) times daily. 60 tablet 3   azelastine (ASTELIN) 0.1 % nasal spray USE 1 SPRAY IN EACH NOSTRIL TWICE DAILY 30 mL 5   Blood Glucose Monitoring Suppl (BLOOD GLUCOSE MONITOR SYSTEM) w/Device KIT 1 Device by Does not apply route 2 (two) times daily. Dx E11.9 1 kit 0   calcium carbonate (OSCAL) 1500 (600 Ca) MG TABS tablet Take by mouth 2 (two) times daily with a meal.     clotrimazole-betamethasone (LOTRISONE) cream APPLY  TWICE DAILY TO TORSO FOR UP TO 14 DAYS (Patient taking differently: Apply 1 Application topically 2 (two) times daily.) 45 g 0   donepezil (ARICEPT) 5 MG tablet Take 2 tablets (10 mg total) by mouth daily. 60 tablet 0   FARXIGA 10 MG TABS tablet TAKE ONE TABLET EACH MORNING BEFORE BREAKFAST (Patient taking differently: Take 10 mg by mouth daily.) 90 tablet 0   [START ON 04/26/2022] ferrous sulfate 325 (65 FE) MG EC tablet Take 1 tablet (325 mg total) by mouth every Monday, Wednesday, and Friday. 30 tablet 3   glipiZIDE (GLUCOTROL) 5 MG tablet Take 1 tablet (5 mg total) by mouth 2 (two) times daily before a meal. 180 tablet 4   glucose blood (ONETOUCH VERIO) test strip Check BS BID Dx E11.9 200 each 3   Lancet Device MISC Use to check BS BID Dx E11.9 100 each 3   Lancets (ONETOUCH DELICA PLUS [id number]) MISC Check BS BID Dx E11.9 200 each 3   levothyroxine (SYNTHROID) 100 MCG tablet TAKE ONE (1) TABLET EACH DAY (Patient taking differently: Take 100 mcg by mouth daily before breakfast.) 90 tablet 3   lidocaine (LIDODERM) 5 % APPLY 1 PATCH AND LEAVE ON FOR 12 HOURS THEN OFF FOR 12 HOURS (Patient not taking: Reported on 04/22/2022) 30 patch 12   loperamide (IMODIUM) 2 MG capsule TAKE 2 CAPSULES BY MOUTH AS NEEDED FOR DIARRHEA/LOOSE STOOL. FOLLOWED BY 1 CAPSULE AFTER EACH LOOSE STOOL (MAX OF 8 CAPSULES DAILY) (Patient  taking differently: Take 2 mg by mouth as needed for diarrhea or loose stools.) 30 capsule 1   losartan (COZAAR) 25 MG tablet Take 0.5 tablets (12.5 mg total) by mouth daily. 30 tablet 4   metoprolol succinate (TOPROL-XL) 25 MG 24 hr tablet Take 0.5 tablets (12.5 mg total) by mouth daily. 45 tablet 2   Multiple Vitamin (MULTIVITAMIN WITH MINERALS) TABS Take 1 tablet by mouth daily.     nitroGLYCERIN (NITROSTAT) 0.4 MG SL tablet DISSOLVE 1 TAB UNDER TOUNGE FOR CHEST PAIN. MAY REPEAT EVERY 5 MINUTES FOR 3 DOSES. IF NO RELIEF CALL 911 OR GO TO ER (Patient taking differently: Place 0.4 mg under the tongue every 5 (five) minutes as needed for chest pain.) 25 tablet 2   ondansetron (ZOFRAN-ODT) 4 MG disintegrating tablet TAKE ONE TABLET BY MOUTH EVERY EIGHT HOURS AS NEEDED (Patient taking differently: Take 4 mg by mouth every 8 (eight) hours as needed for nausea or vomiting.) 30 tablet 1   pantoprazole (PROTONIX) 40 MG tablet Take 1 tablet (40 mg total) by mouth daily. 30 tablet 3   [START ON 04/26/2022] risperiDONE (RISPERDAL) 0.5 MG tablet Take 1 tablet (0.5 mg total) by mouth daily. 30 tablet 2   risperiDONE (RISPERDAL) 1 MG tablet Take 1 tablet (1 mg total) by mouth at bedtime. 30 tablet 2   rosuvastatin (CRESTOR) 10 MG tablet TAKE ONE (1) TABLET EACH DAY (Patient taking differently: Take 10 mg by mouth daily.) 90 tablet 1   RYBELSUS 7 MG TABS TAKE ONE CAPSULE BY MOUTH DAILY 90 tablet 0   spironolactone (ALDACTONE) 25 MG tablet Take 0.5 tablets (12.5 mg total) by mouth daily. 15 tablet 5   SYMBICORT 80-4.5 MCG/ACT inhaler Inhale 2 puffs into the lungs 2 (two) times daily. 10.2 g 2   torsemide (DEMADEX) 20 MG tablet Take 2 tablets (40 mg total) by mouth daily. 60 tablet 2   No current facility-administered medications for this visit.    Allergies:   Codeine, Latex, Morphine,  and Lisinopril    ROS:  Please see the history of present illness.   Otherwise, review of systems are positive for {NONE  DEFAULTED:18576}.   All other systems are reviewed and negative.    PHYSICAL EXAM: VS:  There were no vitals taken for this visit. , BMI There is no height or weight on file to calculate BMI. GENERAL:  Well appearing NECK:  No jugular venous distention, waveform within normal limits, carotid upstroke brisk and symmetric, no bruits, no thyromegaly LUNGS:  Clear to auscultation bilaterally CHEST:  Unremarkable HEART:  PMI not displaced or sustained,S1 and S2 within normal limits, no S3, no S4, no clicks, no rubs, *** murmurs ABD:  Flat, positive bowel sounds normal in frequency in pitch, no bruits, no rebound, no guarding, no midline pulsatile mass, no hepatomegaly, no splenomegaly EXT:  2 plus pulses throughout, no edema, no cyanosis no clubbing    ***GENERAL:  Well appearing HEENT:  Pupils equal round and reactive, fundi not visualized, oral mucosa unremarkable NECK:  No jugular venous distention, waveform within normal limits, carotid upstroke brisk and symmetric, no bruits, no thyromegaly LYMPHATICS:  No cervical, inguinal adenopathy LUNGS:  Clear to auscultation bilaterally BACK:  No CVA tenderness CHEST:  Unremarkable HEART:  PMI not displaced or sustained,S1 and S2 within normal limits, no S3, no S4, no clicks, no rubs, *** murmurs ABD:  Flat, positive bowel sounds normal in frequency in pitch, no bruits, no rebound, no guarding, no midline pulsatile mass, no hepatomegaly, no splenomegaly EXT:  2 plus pulses throughout, no edema, no cyanosis no clubbing SKIN:  No rashes no nodules NEURO:  Cranial nerves II through XII grossly intact, motor grossly intact throughout PSYCH:  Cognitively intact, oriented to person place and time    EKG:  EKG {ACTION; IS/IS TUU:[id number]} ordered today. The ekg ordered today demonstrates ***   Recent Labs: 02/20/2022: TSH 3.140 04/21/2022: ALT 19; B Natriuretic Peptide 745.0 04/22/2022: Hemoglobin 12.0; Platelets 170 04/24/2022: BUN 35; Creatinine,  Ser 1.35; Potassium 3.9; Sodium 137    Lipid Panel    Component Value Date/Time   CHOL 146 10/13/2020 1055   TRIG 340 (H) 10/13/2020 1055   HDL 35 (L) 10/13/2020 1055   CHOLHDL 4.2 10/13/2020 1055   CHOLHDL 3.3 04/13/2019 1722   VLDL 18 04/13/2019 1722   LDLCALC 58 10/13/2020 1055      Wt Readings from Last 3 Encounters:  04/25/22 193 lb 12.6 oz (87.9 kg)  04/05/22 192 lb (87.1 kg)  02/20/22 192 lb 12.8 oz (87.5 kg)      Other studies Reviewed: Additional studies/ records that were reviewed today include: ***. Review of the above records demonstrates:  Please see elsewhere in the note.  ***   ASSESSMENT AND PLAN:  ***   Current medicines are reviewed at length with the patient today.  The patient {ACTIONS; HAS/DOES NOT HAVE:19233} concerns regarding medicines.  The following changes have been made:  {PLAN; NO CHANGE:13088:s}  Labs/ tests ordered today include: *** No orders of the defined types were placed in this encounter.    Disposition:   FU with ***    Signed, Minus Breeding, MD  04/25/2022 7:24 PM    [place]

## 2022-04-25 NOTE — Progress Notes (Signed)
Pt states he wears 2 liters of oxygen at home. Did ambulation test.

## 2022-04-25 NOTE — Discharge Summary (Signed)
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 from Nasopharyngeal swab specimens and should not be used as a sole basis for treatment. Nasal washings and aspirates are unacceptable for Xpert Xpress SARS-CoV-2/FLU/RSV testing.  Fact Sheet for Patients: EntrepreneurPulse.com.au  Fact Sheet for Healthcare Providers: IncredibleEmployment.be  This test is not yet approved or cleared by the [place] FDA and has been authorized for detection and/or diagnosis of SARS-CoV-2 by FDA under an Emergency Use Authorization (EUA). This EUA will remain in effect (meaning this test can be used) for the duration of the COVID-19  declaration under Section 564(b)(1) of the Act, 21 U.S.C. section 360bbb-3(b)(1), unless the authorization is terminated or revoked.     Resp Syncytial  Virus by PCR NEGATIVE NEGATIVE Final    Comment: (NOTE) Fact Sheet for Patients: EntrepreneurPulse.com.au  Fact Sheet for Healthcare Providers: IncredibleEmployment.be  This test is not yet approved or cleared by the [place] FDA and has been authorized for detection and/or diagnosis of SARS-CoV-2 by FDA under an Emergency Use Authorization (EUA). This EUA will remain in effect (meaning this test can be used) for the duration of the COVID-19 declaration under Section 564(b)(1) of the Act, 21 U.S.C. section 360bbb-3(b)(1), unless the authorization is terminated or revoked.  Performed at [facility], [address], [place], [place] [address]     Today   Subjective    Matthew Campos today has no new complaints -Cough and dyspnea improved significantly -Hypoxia improved but not resolved with ambulation patient requires O2 -Voiding well   Patient has been seen and examined prior to discharge   Objective   Blood pressure (!) 107/52, pulse 70, temperature 98.3 F (36.8 C), resp. rate 20, height '5\' 6"'$  (1.676 m), weight 87.9 kg, SpO2 (!) 84 %.   Intake/Output Summary (Last 24 hours) at 04/25/2022 1106 Last data filed at 04/25/2022 0900 Gross per 24 hour  Intake 960 ml  Output 1000 ml  Net -40 ml   Exam Gen:- Awake Alert, no acute distress , no conversational dyspnea HEENT:- [place].AT, No sclera icterus Neck-Supple Neck,No JVD,.  Lungs-improved air movement, no wheezing  CV- S1, S2 normal, regular Abd-  +ve B.Sounds, Abd Soft, No tenderness,    Extremity/Skin:-   good pulses Psych--affect is appropriate, oriented x3, family reports cognitive and memory deficits but overall patient is redirectable and cooperativ  Neuro-no new focal deficits, no tremors    Data Review   CBC w Diff:   Lab Results  Component Value Date   WBC 4.7 04/22/2022   HGB 12.0 (L) 04/22/2022   HGB 14.9 02/20/2022   HCT 37.9 (L) 04/22/2022   HCT 44.6 02/20/2022   PLT 170 04/22/2022   PLT 166 02/20/2022   LYMPHOPCT 10 04/21/2022   MONOPCT 8 04/21/2022   EOSPCT 1 04/21/2022   BASOPCT 1 04/21/2022   CMP:  Lab Results  Component Value Date   NA 137 04/24/2022   NA 132 (L) 02/20/2022   K 3.9 04/24/2022   CL 100 04/24/2022   CO2 29 04/24/2022   BUN 35 (H) 04/24/2022   BUN 46 (H) 02/20/2022   CREATININE 1.35 (H) 04/24/2022   PROT 7.3 04/21/2022   PROT 7.2 02/20/2022   ALBUMIN 3.8 04/21/2022   ALBUMIN 4.4 02/20/2022   BILITOT 0.9 04/21/2022   BILITOT 0.8 02/20/2022   ALKPHOS 54 04/21/2022   AST 24 04/21/2022   ALT 19 04/21/2022  .  Total Discharge time is about 33 minutes  Roxan Hockey M.D on 04/25/2022 at 11:06 AM  Go to [email or web address] -  for contact info  [facility] - Office  [phone]
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
## 2022-04-25 NOTE — Progress Notes (Signed)
Pt was yelling and cussing at beginning of shift about his arm hurting where he was given a shot and stated "They better not give me another damn shot in my arm." Heat pack applied. He has continued to complain of pain in R arm into the morning. Pt wandered into the hallway and was then directed back to his bed. He slept most of the night since then. Vitals stable. Pt complained of nausea in AM, PRN Zofran given.

## 2022-04-25 NOTE — Progress Notes (Signed)
SATURATION QUALIFICATIONS: (This note is used to comply with regulatory documentation for home oxygen)  Patient Saturations on Room Air at Rest = 84%  Patient Saturations on Room Air while Ambulating = 72%  Patient Saturations on 3 Liters of oxygen while Ambulating = 95%  Please briefly explain why patient needs home oxygen:Pt desats on room air.

## 2022-04-26 ENCOUNTER — Ambulatory Visit: Payer: 59 | Admitting: Cardiology

## 2022-04-26 ENCOUNTER — Telehealth: Payer: Self-pay

## 2022-04-26 DIAGNOSIS — I110 Hypertensive heart disease with heart failure: Secondary | ICD-10-CM | POA: Diagnosis not present

## 2022-04-26 DIAGNOSIS — J9621 Acute and chronic respiratory failure with hypoxia: Secondary | ICD-10-CM | POA: Diagnosis not present

## 2022-04-26 DIAGNOSIS — I5023 Acute on chronic systolic (congestive) heart failure: Secondary | ICD-10-CM | POA: Diagnosis not present

## 2022-04-26 DIAGNOSIS — G309 Alzheimer's disease, unspecified: Secondary | ICD-10-CM | POA: Diagnosis not present

## 2022-04-26 DIAGNOSIS — U071 COVID-19: Secondary | ICD-10-CM | POA: Diagnosis not present

## 2022-04-26 DIAGNOSIS — I5043 Acute on chronic combined systolic (congestive) and diastolic (congestive) heart failure: Secondary | ICD-10-CM | POA: Diagnosis not present

## 2022-04-26 DIAGNOSIS — E119 Type 2 diabetes mellitus without complications: Secondary | ICD-10-CM | POA: Diagnosis not present

## 2022-04-26 DIAGNOSIS — J449 Chronic obstructive pulmonary disease, unspecified: Secondary | ICD-10-CM | POA: Diagnosis not present

## 2022-04-26 NOTE — Patient Outreach (Signed)
  Care Coordination [facility] Note Transition Care Management Unsuccessful Follow-up Telephone Call  Date of discharge and from where:  04/25/22 [facility] dx acute on chronic respiratory  failure, COPD exacerbation   Attempts:  1st Attempt  Reason for unsuccessful TCM follow-up call:  Unable to leave message Peter Garter RN, Jackquline [place], [place] Management [phone]

## 2022-04-29 ENCOUNTER — Telehealth: Payer: Self-pay

## 2022-04-29 ENCOUNTER — Telehealth: Payer: Self-pay | Admitting: *Deleted

## 2022-04-29 NOTE — Progress Notes (Signed)
  Care Coordination  Note  04/29/2022 Name: Matthew Campos MRN: [id number] DOB: 01/31/1941  Matthew Campos is a 82 y.o. year old primary care patient of Ronnie Doss M, DO. I reached out to [name] by phone today to assist with scheduling a follow up appointment. [name]  refused to schedule hospital follow up with anyone other than PCP. Forwarding to [facility] clinical pool.        [place]  Direct Dial: [phone]

## 2022-04-29 NOTE — Telephone Encounter (Signed)
-----  Message from Osvaldo Shipper, [place] sent at 04/29/2022 12:48 PM EST ----- Regarding: Please call patient [place] patient to schedule hospital follow up after call with Peter Garter RN for TOC. Patient refused to schedule with anyone other than PCP because "no one else in the practice knows him like his Dr".  Patient was d.c. 04/25/22 needs to be seen by 05/02/22. There was not any available appts with Dr. Lajuana Ripple.    [place]  Direct Dial: [phone]

## 2022-04-29 NOTE — Patient Outreach (Signed)
  Care Coordination [facility] Note Transition Care Management Follow-up Telephone Call Date of discharge and from where: 04/25/22 [facility]  How have you been since you were released from the hospital? Spoke with wife [place].  States pt still has a cough but his leg swelling is less.  States the new medication they gave him is helping him sleep and stay calmer. Any questions or concerns? No  Items Reviewed: Did the pt receive and understand the discharge instructions provided? Yes  Medications obtained and verified? Yes  Other? No  Any new allergies since your discharge? No  Dietary orders reviewed? Yes Do you have support at home? Yes  Granddaughter helps some  Home Care and Equipment/Supplies: Were home health services ordered? yes If so, what is the name of the agency? [facility] to have social worker visit also Has the agency set up a time to come to the patient's home? yes Were any new equipment or medical supplies ordered?  No What is the name of the medical supply agency? N/a Were you able to get the supplies/equipment? not applicable Do you have any questions related to the use of the equipment or supplies? No  Functional Questionnaire: (I = Independent and D = Dependent) ADLs: D  Bathing/Dressing- D  Meal Prep- D  Eating- D  Maintaining continence- D  Transferring/Ambulation- D  Managing Meds- D  Follow up appointments reviewed:  PCP Hospital f/u appt confirmed? No  Scheduled to see  on  @ .referral to care guide to set up appt [facility] f/u appt confirmed? No  Scheduled to see  on  @ . Are transportation arrangements needed? No  If their condition worsens, is the pt aware to call PCP or go to the Emergency Dept.? Yes Was the patient provided with contact information for the PCP's office or ED? Yes Was to pt encouraged to call back with questions or concerns? Yes  SDOH assessments and interventions completed:   Yes SDOH Interventions Today     Flowsheet Row Most Recent Value  SDOH Interventions   Food Insecurity Interventions Intervention Not Indicated  Transportation Interventions Intervention Not Indicated       Care Coordination Interventions:  PCP follow up appointment requested Referred for Care Coordination Services:  RN Care Coordinator   Encounter Outcome:  Pt. Visit Completed   Peter Garter RN, BSN,CCM, CDE Care Management Coordinator [place] Management [phone]

## 2022-04-30 ENCOUNTER — Inpatient Hospital Stay: Payer: Medicare Other | Admitting: Family Medicine

## 2022-04-30 DIAGNOSIS — J9621 Acute and chronic respiratory failure with hypoxia: Secondary | ICD-10-CM | POA: Diagnosis not present

## 2022-04-30 DIAGNOSIS — U071 COVID-19: Secondary | ICD-10-CM | POA: Diagnosis not present

## 2022-04-30 DIAGNOSIS — J449 Chronic obstructive pulmonary disease, unspecified: Secondary | ICD-10-CM | POA: Diagnosis not present

## 2022-04-30 DIAGNOSIS — I5023 Acute on chronic systolic (congestive) heart failure: Secondary | ICD-10-CM | POA: Diagnosis not present

## 2022-04-30 NOTE — Telephone Encounter (Signed)
scheduled

## 2022-05-01 DIAGNOSIS — I5023 Acute on chronic systolic (congestive) heart failure: Secondary | ICD-10-CM | POA: Diagnosis not present

## 2022-05-01 DIAGNOSIS — U071 COVID-19: Secondary | ICD-10-CM | POA: Diagnosis not present

## 2022-05-01 DIAGNOSIS — J449 Chronic obstructive pulmonary disease, unspecified: Secondary | ICD-10-CM | POA: Diagnosis not present

## 2022-05-01 DIAGNOSIS — J9621 Acute and chronic respiratory failure with hypoxia: Secondary | ICD-10-CM | POA: Diagnosis not present

## 2022-05-03 DIAGNOSIS — I714 Abdominal aortic aneurysm, without rupture, unspecified: Secondary | ICD-10-CM | POA: Diagnosis not present

## 2022-05-03 DIAGNOSIS — N183 Chronic kidney disease, stage 3 unspecified: Secondary | ICD-10-CM | POA: Diagnosis not present

## 2022-05-03 DIAGNOSIS — E785 Hyperlipidemia, unspecified: Secondary | ICD-10-CM | POA: Diagnosis not present

## 2022-05-03 DIAGNOSIS — E1122 Type 2 diabetes mellitus with diabetic chronic kidney disease: Secondary | ICD-10-CM | POA: Diagnosis not present

## 2022-05-03 DIAGNOSIS — J449 Chronic obstructive pulmonary disease, unspecified: Secondary | ICD-10-CM | POA: Diagnosis not present

## 2022-05-03 DIAGNOSIS — K219 Gastro-esophageal reflux disease without esophagitis: Secondary | ICD-10-CM | POA: Diagnosis not present

## 2022-05-03 DIAGNOSIS — Z7951 Long term (current) use of inhaled steroids: Secondary | ICD-10-CM | POA: Diagnosis not present

## 2022-05-03 DIAGNOSIS — Z7901 Long term (current) use of anticoagulants: Secondary | ICD-10-CM | POA: Diagnosis not present

## 2022-05-03 DIAGNOSIS — U071 COVID-19: Secondary | ICD-10-CM | POA: Diagnosis not present

## 2022-05-03 DIAGNOSIS — F32A Depression, unspecified: Secondary | ICD-10-CM | POA: Diagnosis not present

## 2022-05-03 DIAGNOSIS — F0283 Dementia in other diseases classified elsewhere, unspecified severity, with mood disturbance: Secondary | ICD-10-CM | POA: Diagnosis not present

## 2022-05-03 DIAGNOSIS — I5023 Acute on chronic systolic (congestive) heart failure: Secondary | ICD-10-CM | POA: Diagnosis not present

## 2022-05-03 DIAGNOSIS — I779 Disorder of arteries and arterioles, unspecified: Secondary | ICD-10-CM | POA: Diagnosis not present

## 2022-05-03 DIAGNOSIS — I480 Paroxysmal atrial fibrillation: Secondary | ICD-10-CM | POA: Diagnosis not present

## 2022-05-03 DIAGNOSIS — Z955 Presence of coronary angioplasty implant and graft: Secondary | ICD-10-CM | POA: Diagnosis not present

## 2022-05-03 DIAGNOSIS — I130 Hypertensive heart and chronic kidney disease with heart failure and stage 1 through stage 4 chronic kidney disease, or unspecified chronic kidney disease: Secondary | ICD-10-CM | POA: Diagnosis not present

## 2022-05-03 DIAGNOSIS — D631 Anemia in chronic kidney disease: Secondary | ICD-10-CM | POA: Diagnosis not present

## 2022-05-03 DIAGNOSIS — F0284 Dementia in other diseases classified elsewhere, unspecified severity, with anxiety: Secondary | ICD-10-CM | POA: Diagnosis not present

## 2022-05-03 DIAGNOSIS — J9621 Acute and chronic respiratory failure with hypoxia: Secondary | ICD-10-CM | POA: Diagnosis not present

## 2022-05-03 DIAGNOSIS — Z952 Presence of prosthetic heart valve: Secondary | ICD-10-CM | POA: Diagnosis not present

## 2022-05-03 DIAGNOSIS — Z9981 Dependence on supplemental oxygen: Secondary | ICD-10-CM | POA: Diagnosis not present

## 2022-05-03 DIAGNOSIS — G309 Alzheimer's disease, unspecified: Secondary | ICD-10-CM | POA: Diagnosis not present

## 2022-05-03 DIAGNOSIS — G4733 Obstructive sleep apnea (adult) (pediatric): Secondary | ICD-10-CM | POA: Diagnosis not present

## 2022-05-06 ENCOUNTER — Encounter: Payer: Self-pay | Admitting: Family Medicine

## 2022-05-06 ENCOUNTER — Ambulatory Visit (INDEPENDENT_AMBULATORY_CARE_PROVIDER_SITE_OTHER): Payer: 59 | Admitting: Family Medicine

## 2022-05-06 VITALS — BP 113/60 | HR 83 | Temp 98.7°F | Ht 66.0 in | Wt 201.0 lb

## 2022-05-06 DIAGNOSIS — F0280 Dementia in other diseases classified elsewhere without behavioral disturbance: Secondary | ICD-10-CM

## 2022-05-06 DIAGNOSIS — J441 Chronic obstructive pulmonary disease with (acute) exacerbation: Secondary | ICD-10-CM

## 2022-05-06 DIAGNOSIS — E1122 Type 2 diabetes mellitus with diabetic chronic kidney disease: Secondary | ICD-10-CM

## 2022-05-06 DIAGNOSIS — N1831 Chronic kidney disease, stage 3a: Secondary | ICD-10-CM | POA: Diagnosis not present

## 2022-05-06 DIAGNOSIS — G309 Alzheimer's disease, unspecified: Secondary | ICD-10-CM | POA: Diagnosis not present

## 2022-05-06 DIAGNOSIS — Z09 Encounter for follow-up examination after completed treatment for conditions other than malignant neoplasm: Secondary | ICD-10-CM

## 2022-05-06 DIAGNOSIS — J9621 Acute and chronic respiratory failure with hypoxia: Secondary | ICD-10-CM

## 2022-05-06 DIAGNOSIS — Z8616 Personal history of COVID-19: Secondary | ICD-10-CM

## 2022-05-06 LAB — BAYER DCA HB A1C WAIVED: HB A1C (BAYER DCA - WAIVED): 7.5 % — ABNORMAL HIGH (ref 4.8–5.6)
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
## 2022-05-06 NOTE — Patient Instructions (Signed)
Dr [facility]. [place],  [place]  [address]  Main: [phone]

## 2022-05-07 LAB — BASIC METABOLIC PANEL
BUN/Creatinine Ratio: 18 (ref 10–24)
BUN: 27 mg/dL (ref 8–27)
CO2: 25 mmol/L (ref 20–29)
Calcium: 9.7 mg/dL (ref 8.6–10.2)
Chloride: 96 mmol/L (ref 96–106)
Creatinine, Ser: 1.51 mg/dL — ABNORMAL HIGH (ref 0.76–1.27)
Glucose: 184 mg/dL — ABNORMAL HIGH (ref 70–99)
Potassium: 4.4 mmol/L (ref 3.5–5.2)
Sodium: 141 mmol/L (ref 134–144)
eGFR: 46 mL/min/{1.73_m2} — ABNORMAL LOW (ref >59)

## 2022-05-07 LAB — CBC
Hematocrit: 42 % (ref 37.5–51.0)
Hemoglobin: 13.6 g/dL (ref 13.0–17.7)
MCH: 31.6 pg (ref 26.6–33.0)
MCHC: 32.4 g/dL (ref 31.5–35.7)
MCV: 98 fL — ABNORMAL HIGH (ref 79–97)
Platelets: 153 10*3/uL (ref 150–450)
RBC: 4.3 x10E6/uL (ref 4.14–5.80)
RDW: 14.9 % (ref 11.6–15.4)
WBC: 7.7 10*3/uL (ref 3.4–10.8)

## 2022-05-08 DIAGNOSIS — J449 Chronic obstructive pulmonary disease, unspecified: Secondary | ICD-10-CM | POA: Diagnosis not present

## 2022-05-08 DIAGNOSIS — Z9981 Dependence on supplemental oxygen: Secondary | ICD-10-CM | POA: Diagnosis not present

## 2022-05-08 DIAGNOSIS — F0284 Dementia in other diseases classified elsewhere, unspecified severity, with anxiety: Secondary | ICD-10-CM | POA: Diagnosis not present

## 2022-05-08 DIAGNOSIS — D631 Anemia in chronic kidney disease: Secondary | ICD-10-CM | POA: Diagnosis not present

## 2022-05-08 DIAGNOSIS — Z952 Presence of prosthetic heart valve: Secondary | ICD-10-CM | POA: Diagnosis not present

## 2022-05-08 DIAGNOSIS — N183 Chronic kidney disease, stage 3 unspecified: Secondary | ICD-10-CM | POA: Diagnosis not present

## 2022-05-08 DIAGNOSIS — I130 Hypertensive heart and chronic kidney disease with heart failure and stage 1 through stage 4 chronic kidney disease, or unspecified chronic kidney disease: Secondary | ICD-10-CM | POA: Diagnosis not present

## 2022-05-08 DIAGNOSIS — F32A Depression, unspecified: Secondary | ICD-10-CM | POA: Diagnosis not present

## 2022-05-08 DIAGNOSIS — I779 Disorder of arteries and arterioles, unspecified: Secondary | ICD-10-CM | POA: Diagnosis not present

## 2022-05-08 DIAGNOSIS — G309 Alzheimer's disease, unspecified: Secondary | ICD-10-CM | POA: Diagnosis not present

## 2022-05-08 DIAGNOSIS — K219 Gastro-esophageal reflux disease without esophagitis: Secondary | ICD-10-CM | POA: Diagnosis not present

## 2022-05-08 DIAGNOSIS — Z7951 Long term (current) use of inhaled steroids: Secondary | ICD-10-CM | POA: Diagnosis not present

## 2022-05-08 DIAGNOSIS — U071 COVID-19: Secondary | ICD-10-CM | POA: Diagnosis not present

## 2022-05-08 DIAGNOSIS — I714 Abdominal aortic aneurysm, without rupture, unspecified: Secondary | ICD-10-CM | POA: Diagnosis not present

## 2022-05-08 DIAGNOSIS — F0283 Dementia in other diseases classified elsewhere, unspecified severity, with mood disturbance: Secondary | ICD-10-CM | POA: Diagnosis not present

## 2022-05-08 DIAGNOSIS — E1122 Type 2 diabetes mellitus with diabetic chronic kidney disease: Secondary | ICD-10-CM | POA: Diagnosis not present

## 2022-05-08 DIAGNOSIS — I5023 Acute on chronic systolic (congestive) heart failure: Secondary | ICD-10-CM | POA: Diagnosis not present

## 2022-05-08 DIAGNOSIS — J9621 Acute and chronic respiratory failure with hypoxia: Secondary | ICD-10-CM | POA: Diagnosis not present

## 2022-05-08 DIAGNOSIS — G4733 Obstructive sleep apnea (adult) (pediatric): Secondary | ICD-10-CM | POA: Diagnosis not present

## 2022-05-08 DIAGNOSIS — Z7901 Long term (current) use of anticoagulants: Secondary | ICD-10-CM | POA: Diagnosis not present

## 2022-05-08 DIAGNOSIS — I480 Paroxysmal atrial fibrillation: Secondary | ICD-10-CM | POA: Diagnosis not present

## 2022-05-08 DIAGNOSIS — Z955 Presence of coronary angioplasty implant and graft: Secondary | ICD-10-CM | POA: Diagnosis not present

## 2022-05-08 DIAGNOSIS — E785 Hyperlipidemia, unspecified: Secondary | ICD-10-CM | POA: Diagnosis not present

## 2022-05-09 ENCOUNTER — Encounter: Payer: Self-pay | Admitting: *Deleted

## 2022-05-09 ENCOUNTER — Ambulatory Visit: Payer: Self-pay | Admitting: *Deleted

## 2022-05-09 ENCOUNTER — Telehealth: Payer: Self-pay | Admitting: *Deleted

## 2022-05-09 DIAGNOSIS — I480 Paroxysmal atrial fibrillation: Secondary | ICD-10-CM | POA: Diagnosis not present

## 2022-05-09 DIAGNOSIS — I130 Hypertensive heart and chronic kidney disease with heart failure and stage 1 through stage 4 chronic kidney disease, or unspecified chronic kidney disease: Secondary | ICD-10-CM | POA: Diagnosis not present

## 2022-05-09 DIAGNOSIS — E1122 Type 2 diabetes mellitus with diabetic chronic kidney disease: Secondary | ICD-10-CM | POA: Diagnosis not present

## 2022-05-09 DIAGNOSIS — I714 Abdominal aortic aneurysm, without rupture, unspecified: Secondary | ICD-10-CM | POA: Diagnosis not present

## 2022-05-09 DIAGNOSIS — I5023 Acute on chronic systolic (congestive) heart failure: Secondary | ICD-10-CM | POA: Diagnosis not present

## 2022-05-09 DIAGNOSIS — G309 Alzheimer's disease, unspecified: Secondary | ICD-10-CM | POA: Diagnosis not present

## 2022-05-09 DIAGNOSIS — N183 Chronic kidney disease, stage 3 unspecified: Secondary | ICD-10-CM | POA: Diagnosis not present

## 2022-05-09 DIAGNOSIS — J9621 Acute and chronic respiratory failure with hypoxia: Secondary | ICD-10-CM | POA: Diagnosis not present

## 2022-05-09 DIAGNOSIS — Z9981 Dependence on supplemental oxygen: Secondary | ICD-10-CM | POA: Diagnosis not present

## 2022-05-09 DIAGNOSIS — Z7951 Long term (current) use of inhaled steroids: Secondary | ICD-10-CM | POA: Diagnosis not present

## 2022-05-09 DIAGNOSIS — Z952 Presence of prosthetic heart valve: Secondary | ICD-10-CM | POA: Diagnosis not present

## 2022-05-09 DIAGNOSIS — Z7901 Long term (current) use of anticoagulants: Secondary | ICD-10-CM | POA: Diagnosis not present

## 2022-05-09 DIAGNOSIS — G4733 Obstructive sleep apnea (adult) (pediatric): Secondary | ICD-10-CM | POA: Diagnosis not present

## 2022-05-09 DIAGNOSIS — F0283 Dementia in other diseases classified elsewhere, unspecified severity, with mood disturbance: Secondary | ICD-10-CM | POA: Diagnosis not present

## 2022-05-09 DIAGNOSIS — F32A Depression, unspecified: Secondary | ICD-10-CM | POA: Diagnosis not present

## 2022-05-09 DIAGNOSIS — I779 Disorder of arteries and arterioles, unspecified: Secondary | ICD-10-CM | POA: Diagnosis not present

## 2022-05-09 DIAGNOSIS — U071 COVID-19: Secondary | ICD-10-CM | POA: Diagnosis not present

## 2022-05-09 DIAGNOSIS — D631 Anemia in chronic kidney disease: Secondary | ICD-10-CM | POA: Diagnosis not present

## 2022-05-09 DIAGNOSIS — Z955 Presence of coronary angioplasty implant and graft: Secondary | ICD-10-CM | POA: Diagnosis not present

## 2022-05-09 DIAGNOSIS — F0284 Dementia in other diseases classified elsewhere, unspecified severity, with anxiety: Secondary | ICD-10-CM | POA: Diagnosis not present

## 2022-05-09 DIAGNOSIS — J449 Chronic obstructive pulmonary disease, unspecified: Secondary | ICD-10-CM | POA: Diagnosis not present

## 2022-05-09 DIAGNOSIS — E785 Hyperlipidemia, unspecified: Secondary | ICD-10-CM | POA: Diagnosis not present

## 2022-05-09 DIAGNOSIS — K219 Gastro-esophageal reflux disease without esophagitis: Secondary | ICD-10-CM | POA: Diagnosis not present

## 2022-05-09 NOTE — Patient Instructions (Signed)
Visit Information  Thank you for taking time to visit with me today. Please don't hesitate to contact me if I can be of assistance to you.   Following are the goals we discussed today:   Goals Addressed               This Visit's Progress     HTN Management (pt-stated)        Care Coordination Interventions: Evaluation of current treatment plan related to HTN and patient's adherence to plan as established by provider Advised patient to provide appropriate vaccination information to provider or CM team member at next visit Advised patient to consult with provider on sleep aide (OTC medicine) Provided education to patient and/or caregiver about advanced directives Provided education to patient re: HTN/COPD (pursed-lip breathing),Use of his prescribed NGT tabs for chest pain with possible side affects. Reviewed medications with patient and discussed adherence however issues with prepared pill box so difficult to identify each medication. Collaborated with Juliann Pulse (PCP office) regarding BP/medication management and Sleep aides Reviewed scheduled/upcoming provider appointments including sufficient transportation source and offered other resources as a back-up however declined. Advised patient, providing education and rationale, to check cbg as prescribed and record, calling provider for findings outside established parameters  Discussed plans with patient for ongoing care management follow up and provided patient with direct contact information for care management team Screening for signs and symptoms of depression related to chronic disease state  Assessed social determinant of health barriers Educated on care management services utilizing Transport planner, Education officer, museum and pharmacy. Discussed benefits of the program and possible barriers in managing the pt's ongoing care. Several topics discussed that will be presented to pt's primary care provider for resolutions. All relayed to Juliann Pulse at  provider's office today and RN wil secure chat directly to the provider along with this above office personnel Juliann Pulse). COPD- reports pt using all inhalers/nebs however encountering some SOB at times. Educated on pursed-lip breathing as wife states she helps and pt recovers well. DM- discussed improving pt's A1C currently at 7.5 05/06/22 with daily CBGs 150-180's with residual symptoms related. Discussed dietary intake with food habits and adding some form of exercises to pt's daily routine (currently working with [facility] for PT/OT). Caregiver also information [facility] can be called after hours if pt needs further assistance from one of the nurse's on call.  HF- states swelling better and improved with stable baseline weights at 194 over the lat 3 days. Aware of what to do if acute. HTN-Wife reports bp 104/52 and she administered pt medication that are prepared in a pillbox by other family members. This resulted in lower bp to 95/60. RN called PCP office and reported this information. Spoke with Juliann Pulse who will follow up with  pt's caregiver later today. She also requested I secure chat with the provider to view this information tomorrow when she returns to the office. No other interventions noted at this time.         Our next appointment is by telephone on 05/31/2022 at 1:00 pm  Please call the care guide team at [phone] if you need to cancel or reschedule your appointment.   If you are experiencing a Mental Health or [place] or need someone to talk to, please call the Suicide and Crisis Lifeline: 988  The patient verbalized understanding of instructions, educational materials, and care plan provided today and DECLINED offer to receive copy of patient instructions, educational materials, and care plan.   Raina Mina,  RN Care Management Coordinator [place] Office [phone]

## 2022-05-09 NOTE — Telephone Encounter (Signed)
TC from [place] While talking w/ pt & wife this morning his BP @ 730 before meds was 104/52 then after meds was 95/60 p 70, no symptoms. They are needing some parameters on what to do and if/what to hold when BP is low but grandchild fixes his pill box and his medication bottles are not in their home.  Lattie Haw has instructed them to work on getting his medications back into the home and she will also work on getting in touch w/ the pharmacy about starting pill packaging, but will hold off on this until we give parameters or if we change any of his medications.  Wife would also like to know about giving him Melatonin at night. He has also been experiencing chest pain and has been calling EMS, wife has instructed him on how to take his nitroglycerin and has not been immediately calling EMS. [facility] is seeing pt that maybe they can do medication education and management.  Please advise on the BP parameters or if changes need to be made and contact the wife.

## 2022-05-09 NOTE — Patient Outreach (Signed)
Care Coordination   Initial Visit Note   05/09/2022 Name: Matthew Campos MRN: [id number] DOB: 01-29-1941  Matthew Campos is a 82 y.o. year old male who sees [name], DO for primary care. I  spoke with the wife [name] DPR today.  What matters to the patients health and wellness today?  HTN Management    Goals Addressed               This Visit's Progress     HTN Management (pt-stated)        Care Coordination Interventions: Evaluation of current treatment plan related to HTN and patient's adherence to plan as established by provider Advised patient to provide appropriate vaccination information to provider or CM team member at next visit Advised patient to consult with provider on sleep aide (OTC medicine) Provided education to patient and/or caregiver about advanced directives Provided education to patient re: HTN/COPD (pursed-lip breathing),Use of his prescribed NGT tabs for chest pain with possible side affects. Reviewed medications with patient and discussed adherence however issues with prepared pill box so difficult to identify each medication. Collaborated with Juliann Pulse (PCP office) regarding BP/medication management and Sleep aides Reviewed scheduled/upcoming provider appointments including sufficient transportation source and offered other resources as a back-up however declined. Advised patient, providing education and rationale, to check cbg as prescribed and record, calling provider for findings outside established parameters  Discussed plans with patient for ongoing care management follow up and provided patient with direct contact information for care management team Screening for signs and symptoms of depression related to chronic disease state  Assessed social determinant of health barriers Educated on care management services utilizing Transport planner, Education officer, museum and pharmacy. Discussed benefits of the program and possible barriers in managing the pt's  ongoing care. Several topics discussed that will be presented to pt's primary care provider for resolutions. All relayed to Juliann Pulse at provider's office today and RN wil secure chat directly to the provider along with this above office personnel Juliann Pulse). COPD- reports pt using all inhalers/nebs however encountering some SOB at times. Educated on pursed-lip breathing as wife states she helps and pt recovers well. DM- discussed improving pt's A1C currently at 7.5 05/06/22 with daily CBGs 150-180's with residual symptoms related. Discussed dietary intake with food habits and adding some form of exercises to pt's daily routine (currently working with [facility] for PT/OT). Caregiver also information [facility] can be called after hours if pt needs further assistance from one of the nurse's on call.  HF- states swelling better and improved with stable baseline weights at 194 over the lat 3 days. Aware of what to do if acute. HTN-Wife reports bp 104/52 and she administered pt medication that are prepared in a pillbox by other family members. This resulted in lower bp to 95/60. RN called PCP office and reported this information. Spoke with Juliann Pulse who will follow up with  pt's caregiver later today. She also requested I secure chat with the provider to view this information tomorrow when she returns to the office. No other interventions noted at this time.         SDOH assessments and interventions completed:  Yes  SDOH Interventions Today    Flowsheet Row Most Recent Value  SDOH Interventions   Food Insecurity Interventions Intervention Not Indicated  Housing Interventions Intervention Not Indicated  Transportation Interventions Intervention Not Indicated  Utilities Interventions Intervention Not Indicated        Care Coordination Interventions:  Yes, provided  Follow up plan: Follow up call scheduled for 05/31/2022 @ 1:00pm    Encounter Outcome:  Pt. Visit Completed   Raina Mina, RN Care  Management Coordinator [place] Office [phone]

## 2022-05-10 ENCOUNTER — Telehealth: Payer: Self-pay | Admitting: Family Medicine

## 2022-05-10 DIAGNOSIS — G309 Alzheimer's disease, unspecified: Secondary | ICD-10-CM | POA: Diagnosis not present

## 2022-05-10 DIAGNOSIS — I5023 Acute on chronic systolic (congestive) heart failure: Secondary | ICD-10-CM | POA: Diagnosis not present

## 2022-05-10 DIAGNOSIS — Z7901 Long term (current) use of anticoagulants: Secondary | ICD-10-CM | POA: Diagnosis not present

## 2022-05-10 DIAGNOSIS — J449 Chronic obstructive pulmonary disease, unspecified: Secondary | ICD-10-CM | POA: Diagnosis not present

## 2022-05-10 DIAGNOSIS — K219 Gastro-esophageal reflux disease without esophagitis: Secondary | ICD-10-CM | POA: Diagnosis not present

## 2022-05-10 DIAGNOSIS — J9621 Acute and chronic respiratory failure with hypoxia: Secondary | ICD-10-CM | POA: Diagnosis not present

## 2022-05-10 DIAGNOSIS — I480 Paroxysmal atrial fibrillation: Secondary | ICD-10-CM | POA: Diagnosis not present

## 2022-05-10 DIAGNOSIS — D631 Anemia in chronic kidney disease: Secondary | ICD-10-CM | POA: Diagnosis not present

## 2022-05-10 DIAGNOSIS — I714 Abdominal aortic aneurysm, without rupture, unspecified: Secondary | ICD-10-CM | POA: Diagnosis not present

## 2022-05-10 DIAGNOSIS — F0284 Dementia in other diseases classified elsewhere, unspecified severity, with anxiety: Secondary | ICD-10-CM | POA: Diagnosis not present

## 2022-05-10 DIAGNOSIS — I130 Hypertensive heart and chronic kidney disease with heart failure and stage 1 through stage 4 chronic kidney disease, or unspecified chronic kidney disease: Secondary | ICD-10-CM | POA: Diagnosis not present

## 2022-05-10 DIAGNOSIS — U071 COVID-19: Secondary | ICD-10-CM | POA: Diagnosis not present

## 2022-05-10 DIAGNOSIS — G4733 Obstructive sleep apnea (adult) (pediatric): Secondary | ICD-10-CM | POA: Diagnosis not present

## 2022-05-10 DIAGNOSIS — I219 Acute myocardial infarction, unspecified: Secondary | ICD-10-CM | POA: Diagnosis not present

## 2022-05-10 DIAGNOSIS — Z952 Presence of prosthetic heart valve: Secondary | ICD-10-CM | POA: Diagnosis not present

## 2022-05-10 DIAGNOSIS — Z7951 Long term (current) use of inhaled steroids: Secondary | ICD-10-CM | POA: Diagnosis not present

## 2022-05-10 DIAGNOSIS — I779 Disorder of arteries and arterioles, unspecified: Secondary | ICD-10-CM | POA: Diagnosis not present

## 2022-05-10 DIAGNOSIS — E1122 Type 2 diabetes mellitus with diabetic chronic kidney disease: Secondary | ICD-10-CM | POA: Diagnosis not present

## 2022-05-10 DIAGNOSIS — F0283 Dementia in other diseases classified elsewhere, unspecified severity, with mood disturbance: Secondary | ICD-10-CM | POA: Diagnosis not present

## 2022-05-10 DIAGNOSIS — E785 Hyperlipidemia, unspecified: Secondary | ICD-10-CM | POA: Diagnosis not present

## 2022-05-10 DIAGNOSIS — N183 Chronic kidney disease, stage 3 unspecified: Secondary | ICD-10-CM | POA: Diagnosis not present

## 2022-05-10 DIAGNOSIS — N1831 Chronic kidney disease, stage 3a: Secondary | ICD-10-CM | POA: Diagnosis not present

## 2022-05-10 DIAGNOSIS — F32A Depression, unspecified: Secondary | ICD-10-CM | POA: Diagnosis not present

## 2022-05-10 DIAGNOSIS — Z9981 Dependence on supplemental oxygen: Secondary | ICD-10-CM | POA: Diagnosis not present

## 2022-05-10 DIAGNOSIS — Z955 Presence of coronary angioplasty implant and graft: Secondary | ICD-10-CM | POA: Diagnosis not present

## 2022-05-10 NOTE — Telephone Encounter (Signed)
Patient's wife was not entirely sure about his medications when I spoke to them.  I really would like to have a medication reconciliation with this patient but I understand that a family member manages the pillbox.  He is very fragile and that he cannot often go without his diuretics because he ends up in heart failure.  He is currently waiting to establish care with a local cardiologist but missed his most recent establish care appointment because of the hospitalization.  If he cannot secure an appointment with Dr. Percival Spanish soon, I would really like him to see his cardiologist at [facility] for medication recommendations.  I certainly would not want to precipitate a hypotensive crisis in this patient

## 2022-05-10 NOTE — Telephone Encounter (Signed)
Attempted to contact - NA 

## 2022-05-13 NOTE — Telephone Encounter (Signed)
Pts wife calling to check on status of if Dr Lajuana Ripple has filled out [place] paperwork for pt? Says she was told by Dr Lajuana Ripple to just call the office when they needed her to fill out the paperwork and that she would get the paperwork and fill it out.  Asked wife when she dropped the paperwork off and she said she didn't drop any paperwork off...  Please advise and call wife.

## 2022-05-13 NOTE — Telephone Encounter (Signed)
Called and spoke with wife - advised her about the medication - she does not know where she wants him to go , she has a few tours this week to look at some homes - once she decides she will call us back

## 2022-05-15 ENCOUNTER — Other Ambulatory Visit: Payer: Self-pay | Admitting: Family Medicine

## 2022-05-15 ENCOUNTER — Emergency Department (HOSPITAL_COMMUNITY): Payer: 59

## 2022-05-15 ENCOUNTER — Other Ambulatory Visit: Payer: Self-pay

## 2022-05-15 ENCOUNTER — Emergency Department (HOSPITAL_COMMUNITY)
Admission: EM | Admit: 2022-05-15 | Discharge: 2022-05-15 | Disposition: A | Discharge status: Home / Self Care | Payer: 59 | Attending: Emergency Medicine | Admitting: Emergency Medicine

## 2022-05-15 DIAGNOSIS — Z91040 Latex allergy status: Secondary | ICD-10-CM | POA: Diagnosis not present

## 2022-05-15 DIAGNOSIS — Z950 Presence of cardiac pacemaker: Secondary | ICD-10-CM | POA: Diagnosis not present

## 2022-05-15 DIAGNOSIS — I129 Hypertensive chronic kidney disease with stage 1 through stage 4 chronic kidney disease, or unspecified chronic kidney disease: Secondary | ICD-10-CM | POA: Insufficient documentation

## 2022-05-15 DIAGNOSIS — R0789 Other chest pain: Secondary | ICD-10-CM | POA: Insufficient documentation

## 2022-05-15 DIAGNOSIS — I2510 Atherosclerotic heart disease of native coronary artery without angina pectoris: Secondary | ICD-10-CM | POA: Insufficient documentation

## 2022-05-15 DIAGNOSIS — Z7984 Long term (current) use of oral hypoglycemic drugs: Secondary | ICD-10-CM | POA: Insufficient documentation

## 2022-05-15 DIAGNOSIS — Z79899 Other long term (current) drug therapy: Secondary | ICD-10-CM | POA: Insufficient documentation

## 2022-05-15 DIAGNOSIS — F0390 Unspecified dementia without behavioral disturbance: Secondary | ICD-10-CM | POA: Insufficient documentation

## 2022-05-15 DIAGNOSIS — J449 Chronic obstructive pulmonary disease, unspecified: Secondary | ICD-10-CM | POA: Insufficient documentation

## 2022-05-15 DIAGNOSIS — N189 Chronic kidney disease, unspecified: Secondary | ICD-10-CM | POA: Diagnosis not present

## 2022-05-15 DIAGNOSIS — R079 Chest pain, unspecified: Secondary | ICD-10-CM | POA: Diagnosis not present

## 2022-05-15 DIAGNOSIS — E1122 Type 2 diabetes mellitus with diabetic chronic kidney disease: Secondary | ICD-10-CM | POA: Diagnosis not present

## 2022-05-15 DIAGNOSIS — E1165 Type 2 diabetes mellitus with hyperglycemia: Secondary | ICD-10-CM | POA: Diagnosis not present

## 2022-05-15 DIAGNOSIS — Z7901 Long term (current) use of anticoagulants: Secondary | ICD-10-CM | POA: Insufficient documentation

## 2022-05-15 DIAGNOSIS — R0602 Shortness of breath: Secondary | ICD-10-CM | POA: Diagnosis not present

## 2022-05-15 DIAGNOSIS — G8929 Other chronic pain: Secondary | ICD-10-CM

## 2022-05-15 LAB — TROPONIN I (HIGH SENSITIVITY)
Troponin I (High Sensitivity): 26 ng/L — ABNORMAL HIGH (ref <18)
Troponin I (High Sensitivity): 31 ng/L — ABNORMAL HIGH (ref <18)

## 2022-05-15 LAB — BASIC METABOLIC PANEL
Anion gap: 10 (ref 5–15)
BUN: 25 mg/dL — ABNORMAL HIGH (ref 8–23)
CO2: 28 mmol/L (ref 22–32)
Calcium: 8.8 mg/dL — ABNORMAL LOW (ref 8.9–10.3)
Chloride: 95 mmol/L — ABNORMAL LOW (ref 98–111)
Creatinine, Ser: 1.39 mg/dL — ABNORMAL HIGH (ref 0.61–1.24)
GFR, Estimated: 51 mL/min — ABNORMAL LOW (ref >60)
Glucose, Bld: 199 mg/dL — ABNORMAL HIGH (ref 70–99)
Potassium: 3.5 mmol/L (ref 3.5–5.1)
Sodium: 133 mmol/L — ABNORMAL LOW (ref 135–145)

## 2022-05-15 LAB — CBC
HCT: 40.1 % (ref 39.0–52.0)
Hemoglobin: 13.1 g/dL (ref 13.0–17.0)
MCH: 32.1 pg (ref 26.0–34.0)
MCHC: 32.7 g/dL (ref 30.0–36.0)
MCV: 98.3 fL (ref 80.0–100.0)
Platelets: 138 10*3/uL — ABNORMAL LOW (ref 150–400)
RBC: 4.08 MIL/uL — ABNORMAL LOW (ref 4.22–5.81)
RDW: 15.3 % (ref 11.5–15.5)
WBC: 7.3 10*3/uL (ref 4.0–10.5)
nRBC: 0 % (ref 0.0–0.2)
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
## 2022-05-15 NOTE — ED Notes (Signed)
Called and spoke with pt's granddaughter who stated she is on the way to pick up pt
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
## 2022-05-17 DIAGNOSIS — G4733 Obstructive sleep apnea (adult) (pediatric): Secondary | ICD-10-CM | POA: Diagnosis not present

## 2022-05-17 DIAGNOSIS — E1122 Type 2 diabetes mellitus with diabetic chronic kidney disease: Secondary | ICD-10-CM | POA: Diagnosis not present

## 2022-05-17 DIAGNOSIS — N183 Chronic kidney disease, stage 3 unspecified: Secondary | ICD-10-CM | POA: Diagnosis not present

## 2022-05-17 DIAGNOSIS — Z952 Presence of prosthetic heart valve: Secondary | ICD-10-CM | POA: Diagnosis not present

## 2022-05-17 DIAGNOSIS — D631 Anemia in chronic kidney disease: Secondary | ICD-10-CM | POA: Diagnosis not present

## 2022-05-17 DIAGNOSIS — U071 COVID-19: Secondary | ICD-10-CM | POA: Diagnosis not present

## 2022-05-17 DIAGNOSIS — E785 Hyperlipidemia, unspecified: Secondary | ICD-10-CM | POA: Diagnosis not present

## 2022-05-17 DIAGNOSIS — I130 Hypertensive heart and chronic kidney disease with heart failure and stage 1 through stage 4 chronic kidney disease, or unspecified chronic kidney disease: Secondary | ICD-10-CM | POA: Diagnosis not present

## 2022-05-17 DIAGNOSIS — G309 Alzheimer's disease, unspecified: Secondary | ICD-10-CM | POA: Diagnosis not present

## 2022-05-17 DIAGNOSIS — Z7901 Long term (current) use of anticoagulants: Secondary | ICD-10-CM | POA: Diagnosis not present

## 2022-05-17 DIAGNOSIS — I714 Abdominal aortic aneurysm, without rupture, unspecified: Secondary | ICD-10-CM | POA: Diagnosis not present

## 2022-05-17 DIAGNOSIS — I779 Disorder of arteries and arterioles, unspecified: Secondary | ICD-10-CM | POA: Diagnosis not present

## 2022-05-17 DIAGNOSIS — J449 Chronic obstructive pulmonary disease, unspecified: Secondary | ICD-10-CM | POA: Diagnosis not present

## 2022-05-17 DIAGNOSIS — Z7951 Long term (current) use of inhaled steroids: Secondary | ICD-10-CM | POA: Diagnosis not present

## 2022-05-17 DIAGNOSIS — Z9981 Dependence on supplemental oxygen: Secondary | ICD-10-CM | POA: Diagnosis not present

## 2022-05-17 DIAGNOSIS — F0284 Dementia in other diseases classified elsewhere, unspecified severity, with anxiety: Secondary | ICD-10-CM | POA: Diagnosis not present

## 2022-05-17 DIAGNOSIS — I480 Paroxysmal atrial fibrillation: Secondary | ICD-10-CM | POA: Diagnosis not present

## 2022-05-17 DIAGNOSIS — J9621 Acute and chronic respiratory failure with hypoxia: Secondary | ICD-10-CM | POA: Diagnosis not present

## 2022-05-17 DIAGNOSIS — F32A Depression, unspecified: Secondary | ICD-10-CM | POA: Diagnosis not present

## 2022-05-17 DIAGNOSIS — K219 Gastro-esophageal reflux disease without esophagitis: Secondary | ICD-10-CM | POA: Diagnosis not present

## 2022-05-17 DIAGNOSIS — I5023 Acute on chronic systolic (congestive) heart failure: Secondary | ICD-10-CM | POA: Diagnosis not present

## 2022-05-17 DIAGNOSIS — F0283 Dementia in other diseases classified elsewhere, unspecified severity, with mood disturbance: Secondary | ICD-10-CM | POA: Diagnosis not present

## 2022-05-17 DIAGNOSIS — Z955 Presence of coronary angioplasty implant and graft: Secondary | ICD-10-CM | POA: Diagnosis not present

## 2022-05-20 DIAGNOSIS — Z955 Presence of coronary angioplasty implant and graft: Secondary | ICD-10-CM | POA: Diagnosis not present

## 2022-05-20 DIAGNOSIS — E1122 Type 2 diabetes mellitus with diabetic chronic kidney disease: Secondary | ICD-10-CM | POA: Diagnosis not present

## 2022-05-20 DIAGNOSIS — F0283 Dementia in other diseases classified elsewhere, unspecified severity, with mood disturbance: Secondary | ICD-10-CM | POA: Diagnosis not present

## 2022-05-20 DIAGNOSIS — G309 Alzheimer's disease, unspecified: Secondary | ICD-10-CM | POA: Diagnosis not present

## 2022-05-20 DIAGNOSIS — E785 Hyperlipidemia, unspecified: Secondary | ICD-10-CM | POA: Diagnosis not present

## 2022-05-20 DIAGNOSIS — N183 Chronic kidney disease, stage 3 unspecified: Secondary | ICD-10-CM | POA: Diagnosis not present

## 2022-05-20 DIAGNOSIS — Z952 Presence of prosthetic heart valve: Secondary | ICD-10-CM | POA: Diagnosis not present

## 2022-05-20 DIAGNOSIS — I130 Hypertensive heart and chronic kidney disease with heart failure and stage 1 through stage 4 chronic kidney disease, or unspecified chronic kidney disease: Secondary | ICD-10-CM | POA: Diagnosis not present

## 2022-05-20 DIAGNOSIS — Z7951 Long term (current) use of inhaled steroids: Secondary | ICD-10-CM | POA: Diagnosis not present

## 2022-05-20 DIAGNOSIS — F0284 Dementia in other diseases classified elsewhere, unspecified severity, with anxiety: Secondary | ICD-10-CM | POA: Diagnosis not present

## 2022-05-20 DIAGNOSIS — J449 Chronic obstructive pulmonary disease, unspecified: Secondary | ICD-10-CM | POA: Diagnosis not present

## 2022-05-20 DIAGNOSIS — K219 Gastro-esophageal reflux disease without esophagitis: Secondary | ICD-10-CM | POA: Diagnosis not present

## 2022-05-20 DIAGNOSIS — F32A Depression, unspecified: Secondary | ICD-10-CM | POA: Diagnosis not present

## 2022-05-20 DIAGNOSIS — G4733 Obstructive sleep apnea (adult) (pediatric): Secondary | ICD-10-CM | POA: Diagnosis not present

## 2022-05-20 DIAGNOSIS — I5023 Acute on chronic systolic (congestive) heart failure: Secondary | ICD-10-CM | POA: Diagnosis not present

## 2022-05-20 DIAGNOSIS — J9621 Acute and chronic respiratory failure with hypoxia: Secondary | ICD-10-CM | POA: Diagnosis not present

## 2022-05-20 DIAGNOSIS — Z9981 Dependence on supplemental oxygen: Secondary | ICD-10-CM | POA: Diagnosis not present

## 2022-05-20 DIAGNOSIS — Z7901 Long term (current) use of anticoagulants: Secondary | ICD-10-CM | POA: Diagnosis not present

## 2022-05-20 DIAGNOSIS — I779 Disorder of arteries and arterioles, unspecified: Secondary | ICD-10-CM | POA: Diagnosis not present

## 2022-05-20 DIAGNOSIS — I480 Paroxysmal atrial fibrillation: Secondary | ICD-10-CM | POA: Diagnosis not present

## 2022-05-20 DIAGNOSIS — D631 Anemia in chronic kidney disease: Secondary | ICD-10-CM | POA: Diagnosis not present

## 2022-05-20 DIAGNOSIS — U071 COVID-19: Secondary | ICD-10-CM | POA: Diagnosis not present

## 2022-05-20 DIAGNOSIS — I714 Abdominal aortic aneurysm, without rupture, unspecified: Secondary | ICD-10-CM | POA: Diagnosis not present

## 2022-05-22 ENCOUNTER — Telehealth: Payer: Self-pay

## 2022-05-22 NOTE — Telephone Encounter (Signed)
     Patient  visit on 1/31  at [facility] you been able to follow up with your primary care physician? Yes   The patient was or was not able to obtain any needed medicine or equipment. Yes   Are there diet recommendations that you are having difficulty following? Na   Patient expresses understanding of discharge instructions and education provided has no other needs at this time.  Yes      [facility] [phone] [address], [place], [place] [address] Phone: [phone] Email: [email or web address]

## 2022-05-23 DIAGNOSIS — I779 Disorder of arteries and arterioles, unspecified: Secondary | ICD-10-CM | POA: Diagnosis not present

## 2022-05-23 DIAGNOSIS — D631 Anemia in chronic kidney disease: Secondary | ICD-10-CM | POA: Diagnosis not present

## 2022-05-23 DIAGNOSIS — F32A Depression, unspecified: Secondary | ICD-10-CM | POA: Diagnosis not present

## 2022-05-23 DIAGNOSIS — I480 Paroxysmal atrial fibrillation: Secondary | ICD-10-CM | POA: Diagnosis not present

## 2022-05-23 DIAGNOSIS — I714 Abdominal aortic aneurysm, without rupture, unspecified: Secondary | ICD-10-CM | POA: Diagnosis not present

## 2022-05-23 DIAGNOSIS — I5023 Acute on chronic systolic (congestive) heart failure: Secondary | ICD-10-CM | POA: Diagnosis not present

## 2022-05-23 DIAGNOSIS — N183 Chronic kidney disease, stage 3 unspecified: Secondary | ICD-10-CM | POA: Diagnosis not present

## 2022-05-23 DIAGNOSIS — Z7901 Long term (current) use of anticoagulants: Secondary | ICD-10-CM | POA: Diagnosis not present

## 2022-05-23 DIAGNOSIS — J9621 Acute and chronic respiratory failure with hypoxia: Secondary | ICD-10-CM | POA: Diagnosis not present

## 2022-05-23 DIAGNOSIS — E785 Hyperlipidemia, unspecified: Secondary | ICD-10-CM | POA: Diagnosis not present

## 2022-05-23 DIAGNOSIS — Z955 Presence of coronary angioplasty implant and graft: Secondary | ICD-10-CM | POA: Diagnosis not present

## 2022-05-23 DIAGNOSIS — I130 Hypertensive heart and chronic kidney disease with heart failure and stage 1 through stage 4 chronic kidney disease, or unspecified chronic kidney disease: Secondary | ICD-10-CM | POA: Diagnosis not present

## 2022-05-23 DIAGNOSIS — U071 COVID-19: Secondary | ICD-10-CM | POA: Diagnosis not present

## 2022-05-23 DIAGNOSIS — Z7951 Long term (current) use of inhaled steroids: Secondary | ICD-10-CM | POA: Diagnosis not present

## 2022-05-23 DIAGNOSIS — E1122 Type 2 diabetes mellitus with diabetic chronic kidney disease: Secondary | ICD-10-CM | POA: Diagnosis not present

## 2022-05-23 DIAGNOSIS — Z9981 Dependence on supplemental oxygen: Secondary | ICD-10-CM | POA: Diagnosis not present

## 2022-05-23 DIAGNOSIS — Z952 Presence of prosthetic heart valve: Secondary | ICD-10-CM | POA: Diagnosis not present

## 2022-05-23 DIAGNOSIS — F0283 Dementia in other diseases classified elsewhere, unspecified severity, with mood disturbance: Secondary | ICD-10-CM | POA: Diagnosis not present

## 2022-05-23 DIAGNOSIS — F0284 Dementia in other diseases classified elsewhere, unspecified severity, with anxiety: Secondary | ICD-10-CM | POA: Diagnosis not present

## 2022-05-23 DIAGNOSIS — K219 Gastro-esophageal reflux disease without esophagitis: Secondary | ICD-10-CM | POA: Diagnosis not present

## 2022-05-23 DIAGNOSIS — J449 Chronic obstructive pulmonary disease, unspecified: Secondary | ICD-10-CM | POA: Diagnosis not present

## 2022-05-23 DIAGNOSIS — G309 Alzheimer's disease, unspecified: Secondary | ICD-10-CM | POA: Diagnosis not present

## 2022-05-23 DIAGNOSIS — G4733 Obstructive sleep apnea (adult) (pediatric): Secondary | ICD-10-CM | POA: Diagnosis not present

## 2022-05-27 DIAGNOSIS — J449 Chronic obstructive pulmonary disease, unspecified: Secondary | ICD-10-CM | POA: Diagnosis not present

## 2022-05-27 DIAGNOSIS — Z7951 Long term (current) use of inhaled steroids: Secondary | ICD-10-CM | POA: Diagnosis not present

## 2022-05-27 DIAGNOSIS — J9621 Acute and chronic respiratory failure with hypoxia: Secondary | ICD-10-CM | POA: Diagnosis not present

## 2022-05-27 DIAGNOSIS — I779 Disorder of arteries and arterioles, unspecified: Secondary | ICD-10-CM | POA: Diagnosis not present

## 2022-05-27 DIAGNOSIS — D631 Anemia in chronic kidney disease: Secondary | ICD-10-CM | POA: Diagnosis not present

## 2022-05-27 DIAGNOSIS — G4733 Obstructive sleep apnea (adult) (pediatric): Secondary | ICD-10-CM | POA: Diagnosis not present

## 2022-05-27 DIAGNOSIS — Z952 Presence of prosthetic heart valve: Secondary | ICD-10-CM | POA: Diagnosis not present

## 2022-05-27 DIAGNOSIS — F32A Depression, unspecified: Secondary | ICD-10-CM | POA: Diagnosis not present

## 2022-05-27 DIAGNOSIS — Z7901 Long term (current) use of anticoagulants: Secondary | ICD-10-CM | POA: Diagnosis not present

## 2022-05-27 DIAGNOSIS — F0283 Dementia in other diseases classified elsewhere, unspecified severity, with mood disturbance: Secondary | ICD-10-CM | POA: Diagnosis not present

## 2022-05-27 DIAGNOSIS — G309 Alzheimer's disease, unspecified: Secondary | ICD-10-CM | POA: Diagnosis not present

## 2022-05-27 DIAGNOSIS — K219 Gastro-esophageal reflux disease without esophagitis: Secondary | ICD-10-CM | POA: Diagnosis not present

## 2022-05-27 DIAGNOSIS — I714 Abdominal aortic aneurysm, without rupture, unspecified: Secondary | ICD-10-CM | POA: Diagnosis not present

## 2022-05-27 DIAGNOSIS — F0284 Dementia in other diseases classified elsewhere, unspecified severity, with anxiety: Secondary | ICD-10-CM | POA: Diagnosis not present

## 2022-05-27 DIAGNOSIS — I130 Hypertensive heart and chronic kidney disease with heart failure and stage 1 through stage 4 chronic kidney disease, or unspecified chronic kidney disease: Secondary | ICD-10-CM | POA: Diagnosis not present

## 2022-05-27 DIAGNOSIS — U071 COVID-19: Secondary | ICD-10-CM | POA: Diagnosis not present

## 2022-05-27 DIAGNOSIS — Z955 Presence of coronary angioplasty implant and graft: Secondary | ICD-10-CM | POA: Diagnosis not present

## 2022-05-27 DIAGNOSIS — Z9981 Dependence on supplemental oxygen: Secondary | ICD-10-CM | POA: Diagnosis not present

## 2022-05-27 DIAGNOSIS — E1122 Type 2 diabetes mellitus with diabetic chronic kidney disease: Secondary | ICD-10-CM | POA: Diagnosis not present

## 2022-05-27 DIAGNOSIS — E785 Hyperlipidemia, unspecified: Secondary | ICD-10-CM | POA: Diagnosis not present

## 2022-05-27 DIAGNOSIS — I5023 Acute on chronic systolic (congestive) heart failure: Secondary | ICD-10-CM | POA: Diagnosis not present

## 2022-05-27 DIAGNOSIS — N183 Chronic kidney disease, stage 3 unspecified: Secondary | ICD-10-CM | POA: Diagnosis not present

## 2022-05-27 DIAGNOSIS — I480 Paroxysmal atrial fibrillation: Secondary | ICD-10-CM | POA: Diagnosis not present

## 2022-05-30 DIAGNOSIS — E119 Type 2 diabetes mellitus without complications: Secondary | ICD-10-CM | POA: Diagnosis not present

## 2022-05-30 DIAGNOSIS — H40011 Open angle with borderline findings, low risk, right eye: Secondary | ICD-10-CM | POA: Diagnosis not present

## 2022-05-30 DIAGNOSIS — Z961 Presence of intraocular lens: Secondary | ICD-10-CM | POA: Diagnosis not present

## 2022-05-30 DIAGNOSIS — H2511 Age-related nuclear cataract, right eye: Secondary | ICD-10-CM | POA: Diagnosis not present

## 2022-05-30 LAB — HM DIABETES EYE EXAM

## 2022-05-31 ENCOUNTER — Ambulatory Visit: Payer: Self-pay | Admitting: *Deleted

## 2022-05-31 DIAGNOSIS — J449 Chronic obstructive pulmonary disease, unspecified: Secondary | ICD-10-CM | POA: Diagnosis not present

## 2022-05-31 DIAGNOSIS — G309 Alzheimer's disease, unspecified: Secondary | ICD-10-CM | POA: Diagnosis not present

## 2022-05-31 DIAGNOSIS — I130 Hypertensive heart and chronic kidney disease with heart failure and stage 1 through stage 4 chronic kidney disease, or unspecified chronic kidney disease: Secondary | ICD-10-CM | POA: Diagnosis not present

## 2022-05-31 DIAGNOSIS — Z952 Presence of prosthetic heart valve: Secondary | ICD-10-CM | POA: Diagnosis not present

## 2022-05-31 DIAGNOSIS — F0284 Dementia in other diseases classified elsewhere, unspecified severity, with anxiety: Secondary | ICD-10-CM | POA: Diagnosis not present

## 2022-05-31 DIAGNOSIS — E1122 Type 2 diabetes mellitus with diabetic chronic kidney disease: Secondary | ICD-10-CM | POA: Diagnosis not present

## 2022-05-31 DIAGNOSIS — G4733 Obstructive sleep apnea (adult) (pediatric): Secondary | ICD-10-CM | POA: Diagnosis not present

## 2022-05-31 DIAGNOSIS — K219 Gastro-esophageal reflux disease without esophagitis: Secondary | ICD-10-CM | POA: Diagnosis not present

## 2022-05-31 DIAGNOSIS — F0283 Dementia in other diseases classified elsewhere, unspecified severity, with mood disturbance: Secondary | ICD-10-CM | POA: Diagnosis not present

## 2022-05-31 DIAGNOSIS — Z9981 Dependence on supplemental oxygen: Secondary | ICD-10-CM | POA: Diagnosis not present

## 2022-05-31 DIAGNOSIS — Z7951 Long term (current) use of inhaled steroids: Secondary | ICD-10-CM | POA: Diagnosis not present

## 2022-05-31 DIAGNOSIS — D631 Anemia in chronic kidney disease: Secondary | ICD-10-CM | POA: Diagnosis not present

## 2022-05-31 DIAGNOSIS — F32A Depression, unspecified: Secondary | ICD-10-CM | POA: Diagnosis not present

## 2022-05-31 DIAGNOSIS — Z955 Presence of coronary angioplasty implant and graft: Secondary | ICD-10-CM | POA: Diagnosis not present

## 2022-05-31 DIAGNOSIS — I5023 Acute on chronic systolic (congestive) heart failure: Secondary | ICD-10-CM | POA: Diagnosis not present

## 2022-05-31 DIAGNOSIS — I779 Disorder of arteries and arterioles, unspecified: Secondary | ICD-10-CM | POA: Diagnosis not present

## 2022-05-31 DIAGNOSIS — E785 Hyperlipidemia, unspecified: Secondary | ICD-10-CM | POA: Diagnosis not present

## 2022-05-31 DIAGNOSIS — I714 Abdominal aortic aneurysm, without rupture, unspecified: Secondary | ICD-10-CM | POA: Diagnosis not present

## 2022-05-31 DIAGNOSIS — N183 Chronic kidney disease, stage 3 unspecified: Secondary | ICD-10-CM | POA: Diagnosis not present

## 2022-05-31 DIAGNOSIS — J9621 Acute and chronic respiratory failure with hypoxia: Secondary | ICD-10-CM | POA: Diagnosis not present

## 2022-05-31 DIAGNOSIS — Z7901 Long term (current) use of anticoagulants: Secondary | ICD-10-CM | POA: Diagnosis not present

## 2022-05-31 DIAGNOSIS — I480 Paroxysmal atrial fibrillation: Secondary | ICD-10-CM | POA: Diagnosis not present

## 2022-05-31 DIAGNOSIS — U071 COVID-19: Secondary | ICD-10-CM | POA: Diagnosis not present

## 2022-05-31 NOTE — Patient Outreach (Signed)
  Care Coordination   05/31/2022 Name: Matthew Campos MRN: [id number] DOB: 08/01/1940   Care Coordination Outreach Attempts:  An unsuccessful telephone outreach was attempted today to offer the patient information about available care coordination services as a benefit of their health plan.   Follow Up Plan:  Additional outreach attempts will be made to offer the patient care coordination information and services.   Encounter Outcome:  No Answer   Care Coordination Interventions:  No, not indicated    SIG Yury Schaus L. Lavina Hamman, RN, BSN, [place] Coordinator Office number [phone]

## 2022-06-03 DIAGNOSIS — J449 Chronic obstructive pulmonary disease, unspecified: Secondary | ICD-10-CM | POA: Diagnosis not present

## 2022-06-03 DIAGNOSIS — F0283 Dementia in other diseases classified elsewhere, unspecified severity, with mood disturbance: Secondary | ICD-10-CM | POA: Diagnosis not present

## 2022-06-03 DIAGNOSIS — I130 Hypertensive heart and chronic kidney disease with heart failure and stage 1 through stage 4 chronic kidney disease, or unspecified chronic kidney disease: Secondary | ICD-10-CM | POA: Diagnosis not present

## 2022-06-03 DIAGNOSIS — Z7901 Long term (current) use of anticoagulants: Secondary | ICD-10-CM | POA: Diagnosis not present

## 2022-06-03 DIAGNOSIS — Z955 Presence of coronary angioplasty implant and graft: Secondary | ICD-10-CM | POA: Diagnosis not present

## 2022-06-03 DIAGNOSIS — Z9981 Dependence on supplemental oxygen: Secondary | ICD-10-CM | POA: Diagnosis not present

## 2022-06-03 DIAGNOSIS — I779 Disorder of arteries and arterioles, unspecified: Secondary | ICD-10-CM | POA: Diagnosis not present

## 2022-06-03 DIAGNOSIS — E1122 Type 2 diabetes mellitus with diabetic chronic kidney disease: Secondary | ICD-10-CM | POA: Diagnosis not present

## 2022-06-03 DIAGNOSIS — F32A Depression, unspecified: Secondary | ICD-10-CM | POA: Diagnosis not present

## 2022-06-03 DIAGNOSIS — I5023 Acute on chronic systolic (congestive) heart failure: Secondary | ICD-10-CM | POA: Diagnosis not present

## 2022-06-03 DIAGNOSIS — D631 Anemia in chronic kidney disease: Secondary | ICD-10-CM | POA: Diagnosis not present

## 2022-06-03 DIAGNOSIS — N183 Chronic kidney disease, stage 3 unspecified: Secondary | ICD-10-CM | POA: Diagnosis not present

## 2022-06-03 DIAGNOSIS — Z7951 Long term (current) use of inhaled steroids: Secondary | ICD-10-CM | POA: Diagnosis not present

## 2022-06-03 DIAGNOSIS — I714 Abdominal aortic aneurysm, without rupture, unspecified: Secondary | ICD-10-CM | POA: Diagnosis not present

## 2022-06-03 DIAGNOSIS — Z952 Presence of prosthetic heart valve: Secondary | ICD-10-CM | POA: Diagnosis not present

## 2022-06-03 DIAGNOSIS — J9621 Acute and chronic respiratory failure with hypoxia: Secondary | ICD-10-CM | POA: Diagnosis not present

## 2022-06-03 DIAGNOSIS — U071 COVID-19: Secondary | ICD-10-CM | POA: Diagnosis not present

## 2022-06-03 DIAGNOSIS — G309 Alzheimer's disease, unspecified: Secondary | ICD-10-CM | POA: Diagnosis not present

## 2022-06-03 DIAGNOSIS — E785 Hyperlipidemia, unspecified: Secondary | ICD-10-CM | POA: Diagnosis not present

## 2022-06-03 DIAGNOSIS — G4733 Obstructive sleep apnea (adult) (pediatric): Secondary | ICD-10-CM | POA: Diagnosis not present

## 2022-06-03 DIAGNOSIS — I480 Paroxysmal atrial fibrillation: Secondary | ICD-10-CM | POA: Diagnosis not present

## 2022-06-03 DIAGNOSIS — K219 Gastro-esophageal reflux disease without esophagitis: Secondary | ICD-10-CM | POA: Diagnosis not present

## 2022-06-03 DIAGNOSIS — F0284 Dementia in other diseases classified elsewhere, unspecified severity, with anxiety: Secondary | ICD-10-CM | POA: Diagnosis not present

## 2022-06-06 DIAGNOSIS — Z955 Presence of coronary angioplasty implant and graft: Secondary | ICD-10-CM | POA: Diagnosis not present

## 2022-06-06 DIAGNOSIS — G309 Alzheimer's disease, unspecified: Secondary | ICD-10-CM | POA: Diagnosis not present

## 2022-06-06 DIAGNOSIS — J9621 Acute and chronic respiratory failure with hypoxia: Secondary | ICD-10-CM | POA: Diagnosis not present

## 2022-06-06 DIAGNOSIS — J449 Chronic obstructive pulmonary disease, unspecified: Secondary | ICD-10-CM | POA: Diagnosis not present

## 2022-06-06 DIAGNOSIS — D631 Anemia in chronic kidney disease: Secondary | ICD-10-CM | POA: Diagnosis not present

## 2022-06-06 DIAGNOSIS — E785 Hyperlipidemia, unspecified: Secondary | ICD-10-CM | POA: Diagnosis not present

## 2022-06-06 DIAGNOSIS — Z952 Presence of prosthetic heart valve: Secondary | ICD-10-CM | POA: Diagnosis not present

## 2022-06-06 DIAGNOSIS — Z7901 Long term (current) use of anticoagulants: Secondary | ICD-10-CM | POA: Diagnosis not present

## 2022-06-06 DIAGNOSIS — I5023 Acute on chronic systolic (congestive) heart failure: Secondary | ICD-10-CM | POA: Diagnosis not present

## 2022-06-06 DIAGNOSIS — I714 Abdominal aortic aneurysm, without rupture, unspecified: Secondary | ICD-10-CM | POA: Diagnosis not present

## 2022-06-06 DIAGNOSIS — F32A Depression, unspecified: Secondary | ICD-10-CM | POA: Diagnosis not present

## 2022-06-06 DIAGNOSIS — G4733 Obstructive sleep apnea (adult) (pediatric): Secondary | ICD-10-CM | POA: Diagnosis not present

## 2022-06-06 DIAGNOSIS — N183 Chronic kidney disease, stage 3 unspecified: Secondary | ICD-10-CM | POA: Diagnosis not present

## 2022-06-06 DIAGNOSIS — I480 Paroxysmal atrial fibrillation: Secondary | ICD-10-CM | POA: Diagnosis not present

## 2022-06-06 DIAGNOSIS — U071 COVID-19: Secondary | ICD-10-CM | POA: Diagnosis not present

## 2022-06-06 DIAGNOSIS — F0284 Dementia in other diseases classified elsewhere, unspecified severity, with anxiety: Secondary | ICD-10-CM | POA: Diagnosis not present

## 2022-06-06 DIAGNOSIS — F0283 Dementia in other diseases classified elsewhere, unspecified severity, with mood disturbance: Secondary | ICD-10-CM | POA: Diagnosis not present

## 2022-06-06 DIAGNOSIS — E1122 Type 2 diabetes mellitus with diabetic chronic kidney disease: Secondary | ICD-10-CM | POA: Diagnosis not present

## 2022-06-06 DIAGNOSIS — I779 Disorder of arteries and arterioles, unspecified: Secondary | ICD-10-CM | POA: Diagnosis not present

## 2022-06-06 DIAGNOSIS — Z9981 Dependence on supplemental oxygen: Secondary | ICD-10-CM | POA: Diagnosis not present

## 2022-06-06 DIAGNOSIS — Z7951 Long term (current) use of inhaled steroids: Secondary | ICD-10-CM | POA: Diagnosis not present

## 2022-06-06 DIAGNOSIS — K219 Gastro-esophageal reflux disease without esophagitis: Secondary | ICD-10-CM | POA: Diagnosis not present

## 2022-06-06 DIAGNOSIS — I130 Hypertensive heart and chronic kidney disease with heart failure and stage 1 through stage 4 chronic kidney disease, or unspecified chronic kidney disease: Secondary | ICD-10-CM | POA: Diagnosis not present

## 2022-06-10 DIAGNOSIS — F0283 Dementia in other diseases classified elsewhere, unspecified severity, with mood disturbance: Secondary | ICD-10-CM | POA: Diagnosis not present

## 2022-06-10 DIAGNOSIS — G4733 Obstructive sleep apnea (adult) (pediatric): Secondary | ICD-10-CM | POA: Diagnosis not present

## 2022-06-10 DIAGNOSIS — G309 Alzheimer's disease, unspecified: Secondary | ICD-10-CM | POA: Diagnosis not present

## 2022-06-10 DIAGNOSIS — I480 Paroxysmal atrial fibrillation: Secondary | ICD-10-CM | POA: Diagnosis not present

## 2022-06-10 DIAGNOSIS — Z955 Presence of coronary angioplasty implant and graft: Secondary | ICD-10-CM | POA: Diagnosis not present

## 2022-06-10 DIAGNOSIS — E785 Hyperlipidemia, unspecified: Secondary | ICD-10-CM | POA: Diagnosis not present

## 2022-06-10 DIAGNOSIS — I714 Abdominal aortic aneurysm, without rupture, unspecified: Secondary | ICD-10-CM | POA: Diagnosis not present

## 2022-06-10 DIAGNOSIS — D631 Anemia in chronic kidney disease: Secondary | ICD-10-CM | POA: Diagnosis not present

## 2022-06-10 DIAGNOSIS — Z7951 Long term (current) use of inhaled steroids: Secondary | ICD-10-CM | POA: Diagnosis not present

## 2022-06-10 DIAGNOSIS — I779 Disorder of arteries and arterioles, unspecified: Secondary | ICD-10-CM | POA: Diagnosis not present

## 2022-06-10 DIAGNOSIS — E1122 Type 2 diabetes mellitus with diabetic chronic kidney disease: Secondary | ICD-10-CM | POA: Diagnosis not present

## 2022-06-10 DIAGNOSIS — F32A Depression, unspecified: Secondary | ICD-10-CM | POA: Diagnosis not present

## 2022-06-10 DIAGNOSIS — U071 COVID-19: Secondary | ICD-10-CM | POA: Diagnosis not present

## 2022-06-10 DIAGNOSIS — Z9981 Dependence on supplemental oxygen: Secondary | ICD-10-CM | POA: Diagnosis not present

## 2022-06-10 DIAGNOSIS — I5023 Acute on chronic systolic (congestive) heart failure: Secondary | ICD-10-CM | POA: Diagnosis not present

## 2022-06-10 DIAGNOSIS — N183 Chronic kidney disease, stage 3 unspecified: Secondary | ICD-10-CM | POA: Diagnosis not present

## 2022-06-10 DIAGNOSIS — Z7901 Long term (current) use of anticoagulants: Secondary | ICD-10-CM | POA: Diagnosis not present

## 2022-06-10 DIAGNOSIS — K219 Gastro-esophageal reflux disease without esophagitis: Secondary | ICD-10-CM | POA: Diagnosis not present

## 2022-06-10 DIAGNOSIS — J449 Chronic obstructive pulmonary disease, unspecified: Secondary | ICD-10-CM | POA: Diagnosis not present

## 2022-06-10 DIAGNOSIS — I130 Hypertensive heart and chronic kidney disease with heart failure and stage 1 through stage 4 chronic kidney disease, or unspecified chronic kidney disease: Secondary | ICD-10-CM | POA: Diagnosis not present

## 2022-06-10 DIAGNOSIS — Z952 Presence of prosthetic heart valve: Secondary | ICD-10-CM | POA: Diagnosis not present

## 2022-06-10 DIAGNOSIS — F0284 Dementia in other diseases classified elsewhere, unspecified severity, with anxiety: Secondary | ICD-10-CM | POA: Diagnosis not present

## 2022-06-10 DIAGNOSIS — J9621 Acute and chronic respiratory failure with hypoxia: Secondary | ICD-10-CM | POA: Diagnosis not present

## 2022-06-12 ENCOUNTER — Ambulatory Visit: Payer: Self-pay | Admitting: *Deleted

## 2022-06-12 NOTE — Patient Outreach (Addendum)
  Care Coordination   Follow Up Visit Note   8/5/2024Late entry for 06/12/22 Name: Matthew Campos MRN: [id number] DOB: [birth year]  Matthew Campos is a 82 y.o. year old male who sees Matthew Ip, DO for primary care. I spoke with [place], wife of Matthew Campos and the patient briefly by phone today.  What matters to the patients health and wellness today?  "I can do nothing. No energy. Don't want to do nothing" Taking iron tablets  Incontinent has leaking all night long,  wear diaper (expensive), takes torsemide 20 mg bid Wt stable at  197 Condom catheters may be tried to help with incontinence  Wife gave permission for an outreach to [facility] cardiology     Goals Addressed               This Visit's Progress     Patient Stated     COMPLETED: Assistance with Transportation (pt-stated)        Closed goal to merge all goals to 2024      HTN.CHF Management [facility]) (pt-stated)   Not on track     Care Coordination Interventions: Interventions Today    Flowsheet Row Most Recent Value  Chronic Disease   Chronic disease during today's visit Other  [fatigue, incontinence, cardiology services]  General Interventions   General Interventions Discussed/Reviewed Doctor Visits, Durable Medical Equipment (DME)  Doctor Visits Discussed/Reviewed PCP, Specialist  Durable Medical Equipment (DME) Other  [condom catheter]  PCP/Specialist Visits Compliance with follow-up visit  Communication with RN  [Matthew Campos [facility]/Dr Senaida Ores CV]  Education Interventions   Education Provided Provided Education  [condom catheter]  Provided Verbal Education On Labs, Medication, Other  Labs Reviewed Kidney Function  Mental Health Interventions   Mental Health Discussed/Reviewed Mental Health Reviewed, Coping Strategies  Pharmacy Interventions   Pharmacy Dicussed/Reviewed Medications and their functions, Pharmacy Topics Discussed            Other     COMPLETED: Caregiver Strain        Closed  goal to merge all goals to 2024      COMPLETED: Chronic Disease Management Needs        Closed goal to merge all goals to 2024      COMPLETED: Need for Hospital Bed        Closed goal to merge all goals to 2024        SDOH assessments and interventions completed:  No     Care Coordination Interventions:  Yes, provided   Follow up plan: Follow up call scheduled for 06/14/22    Encounter Outcome:  Pt. Visit Completed    L. Noelle Penner, RN, BSN, CCM [facility] Care Management Community Coordinator Office number [phone]

## 2022-06-14 ENCOUNTER — Ambulatory Visit: Payer: Self-pay | Admitting: *Deleted

## 2022-06-14 DIAGNOSIS — E789 Disorder of lipoprotein metabolism, unspecified: Secondary | ICD-10-CM | POA: Insufficient documentation

## 2022-06-14 NOTE — Patient Outreach (Signed)
  Care Coordination   Follow Up Visit Note   11/18/2022 late entry for 06/14/22 Name: Matthew Campos MRN: [id number] DOB: Nov 17, 1940  Matthew Campos is a 82 y.o. year old male who sees Raliegh Ip, DO for primary care. I  returned a call after receiving a voice message on 06/13/22 1611 from [place], cardiology nurse  What matters to the patients health and wellness today?  Cardiac/diuretic home management  Questions answered for cardiology nurse Message with answers to be sent by Erin Is patient taking torsemide before bed? Unsure Best to take in the morning to prevent voiding over night Does he take his weight daily? Yes Not always daily How has it been? 197 lbs on 06/12/22 Any shortness of breath or swelling? No swelling but patient reporting no energy Is he taking 40 mg torsemide daily? Torsemide dosage needs clarification Wife discussed 2 different dosages. Not sure if he is receiving the correct dosage Can you confirm if he was taking 20 mg daily before?  No Or 20 mg every other day? NO    Goals Addressed               This Visit's Progress     Patient Stated     HTN.CHF Management [facility]) (pt-stated)        Care Coordination Interventions: Interventions Today    Flowsheet Row Most Recent Value  General Interventions   General Interventions Discussed/Reviewed Communication with  Communication with RN  [Erin [facility]/Dr Senaida Ores CV]  Education Interventions   Education Provided Provided Education  Provided Verbal Education On Labs, Medication, Other  Labs Reviewed Kidney Function  Pharmacy Interventions   Pharmacy Dicussed/Reviewed Medications and their functions, Pharmacy Topics Discussed              SDOH assessments and interventions completed:  No     Care Coordination Interventions:  Yes, provided   Follow up plan: Follow up call scheduled for 06/28/22    Encounter Outcome:  Pt. Visit Completed    L. Noelle Penner, RN, BSN, CCM [facility] Care  Management Community Coordinator Office number [phone]

## 2022-06-18 DIAGNOSIS — N183 Chronic kidney disease, stage 3 unspecified: Secondary | ICD-10-CM | POA: Diagnosis not present

## 2022-06-18 DIAGNOSIS — I714 Abdominal aortic aneurysm, without rupture, unspecified: Secondary | ICD-10-CM | POA: Diagnosis not present

## 2022-06-18 DIAGNOSIS — E785 Hyperlipidemia, unspecified: Secondary | ICD-10-CM | POA: Diagnosis not present

## 2022-06-18 DIAGNOSIS — I130 Hypertensive heart and chronic kidney disease with heart failure and stage 1 through stage 4 chronic kidney disease, or unspecified chronic kidney disease: Secondary | ICD-10-CM | POA: Diagnosis not present

## 2022-06-18 DIAGNOSIS — I779 Disorder of arteries and arterioles, unspecified: Secondary | ICD-10-CM | POA: Diagnosis not present

## 2022-06-18 DIAGNOSIS — Z955 Presence of coronary angioplasty implant and graft: Secondary | ICD-10-CM | POA: Diagnosis not present

## 2022-06-18 DIAGNOSIS — K219 Gastro-esophageal reflux disease without esophagitis: Secondary | ICD-10-CM | POA: Diagnosis not present

## 2022-06-18 DIAGNOSIS — F32A Depression, unspecified: Secondary | ICD-10-CM | POA: Diagnosis not present

## 2022-06-18 DIAGNOSIS — F0284 Dementia in other diseases classified elsewhere, unspecified severity, with anxiety: Secondary | ICD-10-CM | POA: Diagnosis not present

## 2022-06-18 DIAGNOSIS — U071 COVID-19: Secondary | ICD-10-CM | POA: Diagnosis not present

## 2022-06-18 DIAGNOSIS — J9621 Acute and chronic respiratory failure with hypoxia: Secondary | ICD-10-CM | POA: Diagnosis not present

## 2022-06-18 DIAGNOSIS — J449 Chronic obstructive pulmonary disease, unspecified: Secondary | ICD-10-CM | POA: Diagnosis not present

## 2022-06-18 DIAGNOSIS — F0283 Dementia in other diseases classified elsewhere, unspecified severity, with mood disturbance: Secondary | ICD-10-CM | POA: Diagnosis not present

## 2022-06-18 DIAGNOSIS — G309 Alzheimer's disease, unspecified: Secondary | ICD-10-CM | POA: Diagnosis not present

## 2022-06-18 DIAGNOSIS — Z7901 Long term (current) use of anticoagulants: Secondary | ICD-10-CM | POA: Diagnosis not present

## 2022-06-18 DIAGNOSIS — Z9981 Dependence on supplemental oxygen: Secondary | ICD-10-CM | POA: Diagnosis not present

## 2022-06-18 DIAGNOSIS — Z952 Presence of prosthetic heart valve: Secondary | ICD-10-CM | POA: Diagnosis not present

## 2022-06-18 DIAGNOSIS — I480 Paroxysmal atrial fibrillation: Secondary | ICD-10-CM | POA: Diagnosis not present

## 2022-06-18 DIAGNOSIS — E1122 Type 2 diabetes mellitus with diabetic chronic kidney disease: Secondary | ICD-10-CM | POA: Diagnosis not present

## 2022-06-18 DIAGNOSIS — D631 Anemia in chronic kidney disease: Secondary | ICD-10-CM | POA: Diagnosis not present

## 2022-06-18 DIAGNOSIS — G4733 Obstructive sleep apnea (adult) (pediatric): Secondary | ICD-10-CM | POA: Diagnosis not present

## 2022-06-18 DIAGNOSIS — Z7951 Long term (current) use of inhaled steroids: Secondary | ICD-10-CM | POA: Diagnosis not present

## 2022-06-18 DIAGNOSIS — I5023 Acute on chronic systolic (congestive) heart failure: Secondary | ICD-10-CM | POA: Diagnosis not present

## 2022-06-22 DIAGNOSIS — R739 Hyperglycemia, unspecified: Secondary | ICD-10-CM | POA: Diagnosis not present

## 2022-06-22 DIAGNOSIS — R0689 Other abnormalities of breathing: Secondary | ICD-10-CM | POA: Diagnosis not present

## 2022-06-22 DIAGNOSIS — M549 Dorsalgia, unspecified: Secondary | ICD-10-CM | POA: Diagnosis not present

## 2022-06-28 ENCOUNTER — Ambulatory Visit: Payer: Self-pay | Admitting: *Deleted
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
## 2022-07-01 ENCOUNTER — Telehealth: Payer: Self-pay | Admitting: Family Medicine

## 2022-07-01 NOTE — Telephone Encounter (Signed)
Wife would like to speak to PCP nurse about a medication that patient is supposed to be taking. She does not know the name of the medication but thinks that he has stopped taking is. No other information given. Please call back and advise.

## 2022-07-02 DIAGNOSIS — S2232XA Fracture of one rib, left side, initial encounter for closed fracture: Secondary | ICD-10-CM | POA: Insufficient documentation

## 2022-07-02 DIAGNOSIS — R0789 Other chest pain: Secondary | ICD-10-CM | POA: Diagnosis present

## 2022-07-02 DIAGNOSIS — J449 Chronic obstructive pulmonary disease, unspecified: Secondary | ICD-10-CM | POA: Insufficient documentation

## 2022-07-02 DIAGNOSIS — E119 Type 2 diabetes mellitus without complications: Secondary | ICD-10-CM | POA: Insufficient documentation

## 2022-07-02 DIAGNOSIS — Z7901 Long term (current) use of anticoagulants: Secondary | ICD-10-CM | POA: Insufficient documentation

## 2022-07-02 DIAGNOSIS — I509 Heart failure, unspecified: Secondary | ICD-10-CM | POA: Insufficient documentation

## 2022-07-02 DIAGNOSIS — Z91040 Latex allergy status: Secondary | ICD-10-CM | POA: Diagnosis not present

## 2022-07-02 DIAGNOSIS — R0781 Pleurodynia: Secondary | ICD-10-CM | POA: Diagnosis not present

## 2022-07-02 DIAGNOSIS — S2231XA Fracture of one rib, right side, initial encounter for closed fracture: Secondary | ICD-10-CM | POA: Diagnosis not present

## 2022-07-02 DIAGNOSIS — S301XXA Contusion of abdominal wall, initial encounter: Secondary | ICD-10-CM | POA: Diagnosis not present

## 2022-07-02 DIAGNOSIS — W19XXXA Unspecified fall, initial encounter: Secondary | ICD-10-CM | POA: Insufficient documentation

## 2022-07-02 DIAGNOSIS — R109 Unspecified abdominal pain: Secondary | ICD-10-CM | POA: Diagnosis not present

## 2022-07-02 DIAGNOSIS — I4891 Unspecified atrial fibrillation: Secondary | ICD-10-CM | POA: Insufficient documentation

## 2022-07-02 DIAGNOSIS — S20212A Contusion of left front wall of thorax, initial encounter: Secondary | ICD-10-CM | POA: Diagnosis not present

## 2022-07-02 NOTE — Telephone Encounter (Signed)
Attempted to call pt , no answer left vm for cb  

## 2022-07-03 ENCOUNTER — Emergency Department (HOSPITAL_COMMUNITY): Payer: 59

## 2022-07-03 ENCOUNTER — Other Ambulatory Visit: Payer: Self-pay

## 2022-07-03 ENCOUNTER — Encounter (HOSPITAL_COMMUNITY): Payer: Self-pay

## 2022-07-03 ENCOUNTER — Emergency Department (HOSPITAL_COMMUNITY)
Admission: EM | Admit: 2022-07-03 | Discharge: 2022-07-03 | Disposition: A | Discharge status: Home / Self Care | Payer: 59 | Attending: Emergency Medicine | Admitting: Emergency Medicine

## 2022-07-03 ENCOUNTER — Ambulatory Visit: Payer: 59 | Admitting: Family Medicine

## 2022-07-03 DIAGNOSIS — S301XXA Contusion of abdominal wall, initial encounter: Secondary | ICD-10-CM

## 2022-07-03 DIAGNOSIS — W19XXXA Unspecified fall, initial encounter: Secondary | ICD-10-CM

## 2022-07-03 DIAGNOSIS — S2231XA Fracture of one rib, right side, initial encounter for closed fracture: Secondary | ICD-10-CM | POA: Diagnosis not present

## 2022-07-03 DIAGNOSIS — R109 Unspecified abdominal pain: Secondary | ICD-10-CM | POA: Diagnosis not present

## 2022-07-03 DIAGNOSIS — S2232XA Fracture of one rib, left side, initial encounter for closed fracture: Secondary | ICD-10-CM

## 2022-07-03 LAB — COMPREHENSIVE METABOLIC PANEL
ALT: 16 U/L (ref 0–44)
AST: 20 U/L (ref 15–41)
Albumin: 3.9 g/dL (ref 3.5–5.0)
Alkaline Phosphatase: 54 U/L (ref 38–126)
Anion gap: 9 (ref 5–15)
BUN: 36 mg/dL — ABNORMAL HIGH (ref 8–23)
CO2: 29 mmol/L (ref 22–32)
Calcium: 8.9 mg/dL (ref 8.9–10.3)
Chloride: 95 mmol/L — ABNORMAL LOW (ref 98–111)
Creatinine, Ser: 1.58 mg/dL — ABNORMAL HIGH (ref 0.61–1.24)
GFR, Estimated: 43 mL/min — ABNORMAL LOW (ref >60)
Glucose, Bld: 118 mg/dL — ABNORMAL HIGH (ref 70–99)
Potassium: 3.7 mmol/L (ref 3.5–5.1)
Sodium: 133 mmol/L — ABNORMAL LOW (ref 135–145)
Total Bilirubin: 0.9 mg/dL (ref 0.3–1.2)
Total Protein: 7.4 g/dL (ref 6.5–8.1)

## 2022-07-03 LAB — PROTIME-INR
INR: 1.3 — ABNORMAL HIGH (ref 0.8–1.2)
Prothrombin Time: 15.7 seconds — ABNORMAL HIGH (ref 11.4–15.2)

## 2022-07-03 LAB — CBC WITH DIFFERENTIAL/PLATELET
Abs Immature Granulocytes: 0.38 10*3/uL — ABNORMAL HIGH (ref 0.00–0.07)
Basophils Absolute: 0.1 10*3/uL (ref 0.0–0.1)
Basophils Relative: 1 %
Eosinophils Absolute: 0.1 10*3/uL (ref 0.0–0.5)
Eosinophils Relative: 1 %
HCT: 40.4 % (ref 39.0–52.0)
Hemoglobin: 13.6 g/dL (ref 13.0–17.0)
Immature Granulocytes: 4 %
Lymphocytes Relative: 9 %
Lymphs Abs: 0.8 10*3/uL (ref 0.7–4.0)
MCH: 32.6 pg (ref 26.0–34.0)
MCHC: 33.7 g/dL (ref 30.0–36.0)
MCV: 96.9 fL (ref 80.0–100.0)
Monocytes Absolute: 0.8 10*3/uL (ref 0.1–1.0)
Monocytes Relative: 8 %
Neutro Abs: 6.8 10*3/uL (ref 1.7–7.7)
Neutrophils Relative %: 77 %
Platelets: 143 10*3/uL — ABNORMAL LOW (ref 150–400)
RBC: 4.17 MIL/uL — ABNORMAL LOW (ref 4.22–5.81)
RDW: 15.6 % — ABNORMAL HIGH (ref 11.5–15.5)
WBC: 9 10*3/uL (ref 4.0–10.5)
nRBC: 0 % (ref 0.0–0.2)

## 2022-07-03 MED ORDER — FENTANYL CITRATE PF 50 MCG/ML IJ SOSY
50.0000 ug | PREFILLED_SYRINGE | Freq: Once | INTRAMUSCULAR | Status: AC
  Administered 2022-07-03: 50 ug via INTRAVENOUS
  Filled 2022-07-03: qty 1

## 2022-07-03 MED ORDER — HYDROCODONE-ACETAMINOPHEN 5-325 MG PO TABS
2.0000 | ORAL_TABLET | Freq: Once | ORAL | Status: AC
  Administered 2022-07-03: 2 via ORAL
  Filled 2022-07-03: qty 2

## 2022-07-03 MED ORDER — IOHEXOL 300 MG/ML  SOLN
100.0000 mL | Freq: Once | INTRAMUSCULAR | Status: AC | PRN
  Administered 2022-07-03: 100 mL via INTRAVENOUS

## 2022-07-03 MED ORDER — HYDROCODONE-ACETAMINOPHEN 5-325 MG PO TABS: 1.0000 | ORAL_TABLET | Freq: Four times a day (QID) | ORAL | 0 refills | Status: DC | PRN

## 2022-07-03 MED ORDER — ONDANSETRON HCL 4 MG/2ML IJ SOLN
4.0000 mg | Freq: Once | INTRAMUSCULAR | Status: AC
  Administered 2022-07-03: 4 mg via INTRAVENOUS
  Filled 2022-07-03: qty 2

## 2022-07-03 NOTE — ED Provider Notes (Signed)
[place] Provider Note   CSN: [id number] Arrival date & time: 07/02/22  2359     History  No chief complaint on file.   Matthew Campos is a 82 y.o. male.  Patient is an 82 year old male with extensive past medical history including coronary artery disease, CHF, type 2 diabetes, aortic valve replacement, COPD, atrial fibrillation on Eliquis.  Patient presenting today with complaints of a fall.  He reports falling 2 days ago and landing on top of a storage tub.  He is having pain to the left lateral abdomen and left ribs.  It is worse when he breathes or moves.  Pain has been worsening since the fall.  There are no alleviating factors.  Pain worse with palpation.  The history is provided by the patient.       Home Medications Prior to Admission medications   Medication Sig Start Date End Date Taking? Authorizing Provider  albuterol (PROVENTIL) (2.5 MG/3ML) 0.083% nebulizer solution Take 3 mLs (2.5 mg total) by nebulization every 4 (four) hours as needed for wheezing or shortness of breath. 04/25/22   Roxan Hockey, MD  albuterol (VENTOLIN HFA) 108 (90 Base) MCG/ACT inhaler Inhale 2 puffs into the lungs every 6 (six) hours as needed for wheezing or shortness of breath. 04/25/22   Roxan Hockey, MD  apixaban (ELIQUIS) 2.5 MG TABS tablet Take 1 tablet (2.5 mg total) by mouth 2 (two) times daily. 04/25/22   Roxan Hockey, MD  azelastine (ASTELIN) 0.1 % nasal spray USE 1 SPRAY IN EACH NOSTRIL TWICE DAILY 03/19/22   Ronnie Doss M, DO  Blood Glucose Monitoring Suppl (BLOOD GLUCOSE MONITOR SYSTEM) w/Device KIT 1 Device by Does not apply route 2 (two) times daily. Dx E11.9 05/08/21   Ronnie Doss M, DO  calcium carbonate (OSCAL) 1500 (600 Ca) MG TABS tablet Take by mouth 2 (two) times daily with a meal.    [provider]  clotrimazole-betamethasone (LOTRISONE) cream APPLY TWICE DAILY TO TORSO FOR UP TO 14 DAYS Patient  taking differently: Apply 1 Application topically 2 (two) times daily. 04/19/22   [name], DO  donepezil (ARICEPT) 5 MG tablet Take 2 tablets (10 mg total) by mouth daily. 04/25/22   Emokpae, Courage, MD  FARXIGA 10 MG TABS tablet TAKE ONE TABLET EACH MORNING BEFORE BREAKFAST Patient taking differently: Take 10 mg by mouth daily. 03/19/22   [name], DO  ferrous sulfate 325 (65 FE) MG EC tablet Take 1 tablet (325 mg total) by mouth every Monday, Wednesday, and Friday. 04/26/22   Roxan Hockey, MD  glipiZIDE (GLUCOTROL) 5 MG tablet Take 1 tablet (5 mg total) by mouth 2 (two) times daily before a meal. 04/25/22   Emokpae, Courage, MD  glucose blood (ONETOUCH VERIO) test strip Check BS BID Dx E11.9 11/29/21   [name], DO  Lancet Device MISC Use to check BS BID Dx E11.9 05/08/21   Ronnie Doss M, DO  Lancets [facility] DELICA PLUS [id number]) MISC Check BS BID Dx E11.9 11/29/21   [name], DO  levothyroxine (SYNTHROID) 100 MCG tablet TAKE ONE (1) TABLET EACH DAY Patient taking differently: Take 100 mcg by mouth daily before breakfast. 04/18/22   Ronnie Doss M, DO  lidocaine (LIDODERM) 5 % APPLY 1 PATCH AND LEAVE ON FOR 12 HOURS THEN OFF FOR 12 HOURS Patient not taking: Reported on 05/15/2022 06/18/21   [name], DO  loperamide (IMODIUM) 2 MG capsule TAKE 2  CAPSULES BY MOUTH AS NEEDED FOR DIARRHEA/LOOSE STOOL. FOLLOWED BY 1 CAPSULE AFTER EACH LOOSE STOOL (MAX OF 8 CAPSULES DAILY) Patient taking differently: Take 2 mg by mouth as needed for diarrhea or loose stools. 04/19/22   [name], DO  losartan (COZAAR) 25 MG tablet Take 0.5 tablets (12.5 mg total) by mouth daily. 04/25/22   Roxan Hockey, MD  metoprolol succinate (TOPROL-XL) 25 MG 24 hr tablet Take 0.5 tablets (12.5 mg total) by mouth daily. 04/25/22   Roxan Hockey, MD  Multiple Vitamin (MULTIVITAMIN WITH MINERALS) TABS Take 1 tablet by mouth daily.    [provider]   nitroGLYCERIN (NITROSTAT) 0.4 MG SL tablet DISSOLVE 1 TAB UNDER TOUNGE FOR CHEST PAIN. MAY REPEAT EVERY 5 MINUTES FOR 3 DOSES. IF NO RELIEF CALL 911 OR GO TO ER 05/16/22   Ronnie Doss M, DO  ondansetron (ZOFRAN-ODT) 4 MG disintegrating tablet TAKE ONE TABLET BY MOUTH EVERY EIGHT HOURS AS NEEDED Patient taking differently: Take 4 mg by mouth every 8 (eight) hours as needed for nausea or vomiting. 04/19/22   [name], DO  pantoprazole (PROTONIX) 40 MG tablet Take 1 tablet (40 mg total) by mouth daily. 04/25/22   Roxan Hockey, MD  risperiDONE (RISPERDAL) 0.5 MG tablet Take 1 tablet (0.5 mg total) by mouth daily. 04/26/22   Roxan Hockey, MD  risperiDONE (RISPERDAL) 1 MG tablet Take 1 tablet (1 mg total) by mouth at bedtime. 04/25/22   Roxan Hockey, MD  rosuvastatin (CRESTOR) 10 MG tablet TAKE ONE (1) TABLET EACH DAY Patient taking differently: Take 10 mg by mouth daily. 02/20/22   [name], DO  RYBELSUS 7 MG TABS TAKE ONE CAPSULE BY MOUTH DAILY 03/19/22   Ronnie Doss M, DO  spironolactone (ALDACTONE) 25 MG tablet Take 0.5 tablets (12.5 mg total) by mouth daily. 04/25/22 04/25/23  Roxan Hockey, MD  SYMBICORT 80-4.5 MCG/ACT inhaler Inhale 2 puffs into the lungs 2 (two) times daily. 04/25/22   Roxan Hockey, MD  torsemide (DEMADEX) 20 MG tablet Take 2 tablets (40 mg total) by mouth daily. 04/25/22   Roxan Hockey, MD      Allergies    Codeine, Latex, Morphine, and Lisinopril    Review of Systems   Review of Systems  All other systems reviewed and are negative.   Physical Exam Updated Vital Signs BP 120/75   Pulse 94   Resp 18   Wt 90.7 kg   SpO2 95%   BMI 32.28 kg/m  Physical Exam Vitals and nursing note reviewed.  Constitutional:      General: He is not in acute distress.    Appearance: He is well-developed. He is not diaphoretic.  HENT:     Head: Normocephalic and atraumatic.  Cardiovascular:     Rate and Rhythm: Normal rate and regular  rhythm.     Heart sounds: No murmur heard.    No friction rub.  Pulmonary:     Effort: Pulmonary effort is normal. No respiratory distress.     Breath sounds: Normal breath sounds. No wheezing or rales.     Comments: There is tenderness to palpation over the right lateral ribs.  There is no crepitus.  Breath sounds are equal bilaterally. Abdominal:     General: Bowel sounds are normal. There is no distension.     Palpations: Abdomen is soft.     Tenderness: There is abdominal tenderness. There is no right CVA tenderness, left CVA tenderness, guarding or rebound.     Comments:  There is tenderness to palpation of the left upper quadrant and left lateral abdomen.  Musculoskeletal:        General: Normal range of motion.     Cervical back: Normal range of motion and neck supple.  Skin:    General: Skin is warm and dry.  Neurological:     Mental Status: He is alert and oriented to person, place, and time.     Coordination: Coordination normal.     ED Results / Procedures / Treatments   Labs (all labs ordered are listed, but only abnormal results are displayed) Labs Reviewed  COMPREHENSIVE METABOLIC PANEL  CBC WITH DIFFERENTIAL/PLATELET  PROTIME-INR    EKG None  Radiology No results found.  Procedures Procedures    Medications Ordered in ED Medications  ondansetron (ZOFRAN) injection 4 mg (has no administration in time range)  fentaNYL (SUBLIMAZE) injection 50 mcg (has no administration in time range)    ED Course/ Medical Decision Making/ A&P  Patient is an 82 year old male with past medical history as per HPI presenting with complaints of a fall.  He fell and landed on a storage container 2 days ago and has been having pain in his left chest.  He believes he may have broken a rib.  Patient arrives here with stable vital signs and no hypoxia.  Breath sounds are clear bilaterally.  Physical examination reveals tenderness to the left chest and left upper abdomen, but no  other abnormal findings.  Workup initiated including CBC, metabolic panel, both of which were unremarkable.  I elected to obtain a CT scan of the chest, abdomen, and pelvis to rule out injury to the torso.  Patient did have 1/9 rib fracture on the CT scan but no evidence for pneumothorax or hemothorax.  Abdominal CT negative.  Patient to be discharged with pain medication and as needed return.  He was given morphine here in the ER.  Final Clinical Impression(s) / ED Diagnoses Final diagnoses:  None    Rx / DC Orders ED Discharge Orders     None         Veryl Speak, MD 07/03/22 [phone]

## 2022-07-03 NOTE — ED Notes (Signed)
Patient transported to CT 

## 2022-07-03 NOTE — ED Notes (Signed)
Called & left message that pt left his glasses in treatment room & they are with security.

## 2022-07-03 NOTE — ED Triage Notes (Signed)
Pt fell Sunday morning and now complaining of left flank pain. Pt on 2L [place] at all times.

## 2022-07-03 NOTE — Discharge Instructions (Addendum)
Take hydrocodone as prescribed as needed for pain.  Return to the emergency department if symptoms significantly worsen or change. 

## 2022-07-03 NOTE — ED Notes (Signed)
ED Provider at bedside. 

## 2022-07-04 ENCOUNTER — Inpatient Hospital Stay: Payer: 59 | Admitting: Family Medicine

## 2022-07-06 ENCOUNTER — Other Ambulatory Visit: Payer: Self-pay | Admitting: Family Medicine

## 2022-07-06 ENCOUNTER — Other Ambulatory Visit: Payer: Self-pay | Admitting: Psychiatry

## 2022-07-06 DIAGNOSIS — F0280 Dementia in other diseases classified elsewhere without behavioral disturbance: Secondary | ICD-10-CM

## 2022-07-06 DIAGNOSIS — N1831 Chronic kidney disease, stage 3a: Secondary | ICD-10-CM

## 2022-07-06 DIAGNOSIS — R197 Diarrhea, unspecified: Secondary | ICD-10-CM

## 2022-07-08 DIAGNOSIS — R0902 Hypoxemia: Secondary | ICD-10-CM | POA: Diagnosis not present

## 2022-07-08 DIAGNOSIS — I491 Atrial premature depolarization: Secondary | ICD-10-CM | POA: Diagnosis not present

## 2022-07-08 DIAGNOSIS — R0600 Dyspnea, unspecified: Secondary | ICD-10-CM | POA: Diagnosis not present

## 2022-07-08 DIAGNOSIS — Z45018 Encounter for adjustment and management of other part of cardiac pacemaker: Secondary | ICD-10-CM | POA: Diagnosis not present

## 2022-07-08 NOTE — Telephone Encounter (Signed)
Pt last seen on 02/15/21

## 2022-07-09 ENCOUNTER — Other Ambulatory Visit: Payer: Self-pay | Admitting: Psychiatry

## 2022-07-09 ENCOUNTER — Other Ambulatory Visit: Payer: Self-pay | Admitting: Family Medicine

## 2022-07-09 DIAGNOSIS — F0280 Dementia in other diseases classified elsewhere without behavioral disturbance: Secondary | ICD-10-CM

## 2022-07-09 DIAGNOSIS — R197 Diarrhea, unspecified: Secondary | ICD-10-CM

## 2022-07-10 ENCOUNTER — Telehealth: Payer: Self-pay

## 2022-07-10 NOTE — Telephone Encounter (Signed)
     Patient  visit on 3/20  at [facility] you been able to follow up with your primary care physician? Na   The patient was or was not able to obtain any needed medicine or equipment. Na   Are there diet recommendations that you are having difficulty following? Yes   Patient expresses understanding of discharge instructions and education provided has no other needs at this time.  Yes      [facility] [phone] [address], [place], [place] [address] Phone: [phone] Email: [email or web address]

## 2022-07-17 DIAGNOSIS — I480 Paroxysmal atrial fibrillation: Secondary | ICD-10-CM | POA: Diagnosis not present

## 2022-07-17 DIAGNOSIS — Z45018 Encounter for adjustment and management of other part of cardiac pacemaker: Secondary | ICD-10-CM | POA: Diagnosis not present

## 2022-07-19 NOTE — Telephone Encounter (Signed)
Attempted to call pt , no answer left vm - closing call

## 2022-07-23 ENCOUNTER — Emergency Department (HOSPITAL_COMMUNITY)
Admission: EM | Admit: 2022-07-23 | Discharge: 2022-07-23 | Disposition: A | Discharge status: Home / Self Care | Payer: 59 | Attending: Emergency Medicine | Admitting: Emergency Medicine

## 2022-07-23 ENCOUNTER — Other Ambulatory Visit: Payer: Self-pay

## 2022-07-23 ENCOUNTER — Emergency Department (HOSPITAL_COMMUNITY): Payer: 59

## 2022-07-23 DIAGNOSIS — I10 Essential (primary) hypertension: Secondary | ICD-10-CM | POA: Diagnosis not present

## 2022-07-23 DIAGNOSIS — Z79899 Other long term (current) drug therapy: Secondary | ICD-10-CM | POA: Insufficient documentation

## 2022-07-23 DIAGNOSIS — R059 Cough, unspecified: Secondary | ICD-10-CM | POA: Diagnosis not present

## 2022-07-23 DIAGNOSIS — I110 Hypertensive heart disease with heart failure: Secondary | ICD-10-CM | POA: Insufficient documentation

## 2022-07-23 DIAGNOSIS — J449 Chronic obstructive pulmonary disease, unspecified: Secondary | ICD-10-CM | POA: Diagnosis not present

## 2022-07-23 DIAGNOSIS — Z7901 Long term (current) use of anticoagulants: Secondary | ICD-10-CM | POA: Insufficient documentation

## 2022-07-23 DIAGNOSIS — Z87891 Personal history of nicotine dependence: Secondary | ICD-10-CM | POA: Diagnosis not present

## 2022-07-23 DIAGNOSIS — I5023 Acute on chronic systolic (congestive) heart failure: Secondary | ICD-10-CM | POA: Insufficient documentation

## 2022-07-23 DIAGNOSIS — R0602 Shortness of breath: Secondary | ICD-10-CM | POA: Diagnosis not present

## 2022-07-23 DIAGNOSIS — Z950 Presence of cardiac pacemaker: Secondary | ICD-10-CM | POA: Insufficient documentation

## 2022-07-23 DIAGNOSIS — Z91040 Latex allergy status: Secondary | ICD-10-CM | POA: Diagnosis not present

## 2022-07-23 DIAGNOSIS — R0902 Hypoxemia: Secondary | ICD-10-CM | POA: Diagnosis not present

## 2022-07-23 DIAGNOSIS — E119 Type 2 diabetes mellitus without complications: Secondary | ICD-10-CM | POA: Diagnosis not present

## 2022-07-23 DIAGNOSIS — I2510 Atherosclerotic heart disease of native coronary artery without angina pectoris: Secondary | ICD-10-CM | POA: Diagnosis not present

## 2022-07-23 DIAGNOSIS — R079 Chest pain, unspecified: Secondary | ICD-10-CM | POA: Diagnosis not present

## 2022-07-23 DIAGNOSIS — Z7984 Long term (current) use of oral hypoglycemic drugs: Secondary | ICD-10-CM | POA: Insufficient documentation

## 2022-07-23 DIAGNOSIS — Z794 Long term (current) use of insulin: Secondary | ICD-10-CM | POA: Diagnosis not present

## 2022-07-23 DIAGNOSIS — R000 Tachycardia, unspecified: Secondary | ICD-10-CM | POA: Diagnosis not present

## 2022-07-23 LAB — BASIC METABOLIC PANEL
Anion gap: 12 (ref 5–15)
BUN: 35 mg/dL — ABNORMAL HIGH (ref 8–23)
CO2: 27 mmol/L (ref 22–32)
Calcium: 8.4 mg/dL — ABNORMAL LOW (ref 8.9–10.3)
Chloride: 96 mmol/L — ABNORMAL LOW (ref 98–111)
Creatinine, Ser: 1.46 mg/dL — ABNORMAL HIGH (ref 0.61–1.24)
GFR, Estimated: 48 mL/min — ABNORMAL LOW (ref >60)
Glucose, Bld: 158 mg/dL — ABNORMAL HIGH (ref 70–99)
Potassium: 4.1 mmol/L (ref 3.5–5.1)
Sodium: 135 mmol/L (ref 135–145)

## 2022-07-23 LAB — CBC
HCT: 40.2 % (ref 39.0–52.0)
Hemoglobin: 12.5 g/dL — ABNORMAL LOW (ref 13.0–17.0)
MCH: 31.1 pg (ref 26.0–34.0)
MCHC: 31.1 g/dL (ref 30.0–36.0)
MCV: 100 fL (ref 80.0–100.0)
Platelets: 126 10*3/uL — ABNORMAL LOW (ref 150–400)
RBC: 4.02 MIL/uL — ABNORMAL LOW (ref 4.22–5.81)
RDW: 15.4 % (ref 11.5–15.5)
WBC: 7.6 10*3/uL (ref 4.0–10.5)
nRBC: 0 % (ref 0.0–0.2)

## 2022-07-23 LAB — TROPONIN I (HIGH SENSITIVITY)
Troponin I (High Sensitivity): 33 ng/L — ABNORMAL HIGH (ref <18)
Troponin I (High Sensitivity): 38 ng/L — ABNORMAL HIGH (ref <18)

## 2022-07-23 LAB — BRAIN NATRIURETIC PEPTIDE: B Natriuretic Peptide: 310.1 pg/mL — ABNORMAL HIGH (ref 0.0–100.0)

## 2022-07-23 MED ORDER — FUROSEMIDE 10 MG/ML IJ SOLN
20.0000 mg | Freq: Once | INTRAMUSCULAR | Status: AC
  Administered 2022-07-23: 20 mg via INTRAVENOUS
  Filled 2022-07-23: qty 2

## 2022-07-23 MED ORDER — IPRATROPIUM-ALBUTEROL 0.5-2.5 (3) MG/3ML IN SOLN
3.0000 mL | Freq: Once | RESPIRATORY_TRACT | Status: AC
  Administered 2022-07-23: 3 mL via RESPIRATORY_TRACT
  Filled 2022-07-23: qty 3

## 2022-07-23 MED ORDER — ALBUTEROL SULFATE (2.5 MG/3ML) 0.083% IN NEBU
2.5000 mg | INHALATION_SOLUTION | Freq: Once | RESPIRATORY_TRACT | Status: AC
  Administered 2022-07-23: 2.5 mg via RESPIRATORY_TRACT
  Filled 2022-07-23: qty 3

## 2022-07-23 NOTE — ED Notes (Signed)
Safe transport took pt home

## 2022-07-23 NOTE — ED Provider Notes (Signed)
9:51 AM Patient awake, alert in no distress, no new oxygen requirement.  Per report with diuresis, which the patient has had, absent oxygen requirement, patient may be appropriate for discharge home.  Second troponin is minimally changed compared to prior, slightly elevated, but well within the patient's typical range of 20s, 30s.  He has no ongoing complaints.  I discussed discharge with him to which he is comfortable.  With some family hesitancy for patient returning I had the social workers speak with family members, discussed the situation, family, including wife, who is apparently the patient's primary caregiver is comfortable with patient returning home.   Gerhard Munch, MD 07/23/22 [phone]

## 2022-07-23 NOTE — Progress Notes (Signed)
CSW spoke with patients Wife [place], [phone]. Patients wife is not sure why patient was sent to [facility] when they reside in [place]. Patients wife stated her husband has dementia really bad and often has behaviors. CSW asked patients wife if she has considered memory care or any out of home placement. Patients wife stated yes but they are 8 dollars over the medicaid limit that is needed for long term care placement. Ms. [place] stated that the medicaid worker told her that they would take a portion of patients check to pay for long term care. Ms. [place] stated she will need some of the check for her to live off of. Patients wife was also told that the memory care places will not take her husband if he has behaviors. CSW did tell patients wife that depending on the level of behaviors some facilities will decline care. Patients wife stated she is trying to get patient into the [facility] behavioral hospital this week. CSW encouraged patients wife to re-apply for medicaid because without Medicaid she would have to pay out of pocket for placement. This could range from $6000,12,000 monthly. Patients wife cannot afford to pay out of pocket at this time. Patients wife is fine with patient coming home. Patient will need safe transport due to having dementia. Patients nurse was notified.

## 2022-07-23 NOTE — Discharge Instructions (Signed)
Today's evaluation has demonstrated some fluid retention consistent with your known history of heart failure.  Please take all medication as directed and be sure to follow-up with your physician this week for appropriate ongoing outpatient management.  Do not hesitate to return here for concerning changes in your condition.

## 2022-07-23 NOTE — ED Notes (Signed)
Safe transport called to take pt home. No ETA provided.

## 2022-07-23 NOTE — ED Triage Notes (Signed)
Pt from home c/o chest pain and SOB. Also c/o productive cough x 1 month

## 2022-07-23 NOTE — ED Provider Notes (Signed)
[place] EMERGENCY DEPARTMENT AT [facility] Provider Note   CSN: [id number] Arrival date & time: 07/23/22  0533     History  Chief Complaint  Patient presents with   Chest Pain   Shortness of Breath    Matthew Campos is a 82 y.o. male.  The history is provided by the patient.  Patient with extensive history including CAD, atrial fibrillation, aortic stenosis, CHF, COPD with chronic respiratory failure on 2 L oxygen presenting with chest pain.  Patient reports earlier in the night he became frustrated with his wife, and started having chest pain.  It has since resolved.  He also reports cough and shortness of breath for over a month.  No fevers. He is a former smoker    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   Abdominal aortic aneurysm without rupture (HCC)    Anemia    Anxiety and depression    Aortic stenosis    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HCC)    CHF (congestive heart failure) (HCC)    Cholecystitis    Chronic ischemic heart disease    Complication of anesthesia    hard to be put to sleep   COPD (chronic obstructive pulmonary disease) (HCC)    Coronary artery disease    Diabetes mellitus without complication (HCC)    Diabetes mellitus without complication (HCC)    takes Januvia and Metformin daily   GERD (gastroesophageal reflux disease)    GERD (gastroesophageal reflux disease)    takes omeprazole daily   Headache(784.0)    sinus   History of bronchitis    last time >72yrs ago   Hyperlipidemia    takes Lipitor daily   Hypertension    Hypertension    takes Metoprolol daily   Infected sebaceous cyst 01/30/2021   Joint pain    Myocardial infarction (HCC)    x 3;last one about 3-44yrs ago   Neoplasm of uncertain behavior of bladder    Obesity    OSA (obstructive sleep apnea)    Pacemaker    Pancytopenia (HCC)    Paroxysmal atrial fibrillation (HCC)    Pneumonia    last itme about  31yrs ago   S/P TAVR (transcatheter aortic valve replacement)    Severe aortic stenosis    Sinus bradycardia     Home Medications Prior to Admission medications   Medication Sig Start Date End Date Taking? Authorizing Provider  albuterol (PROVENTIL) (2.5 MG/3ML) 0.083% nebulizer solution Take 3 mLs (2.5 mg total) by nebulization every 4 (four) hours as needed for wheezing or shortness of breath. 04/25/22   Shon Hale, MD  albuterol (VENTOLIN HFA) 108 (90 Base) MCG/ACT inhaler Inhale 2 puffs into the lungs every 6 (six) hours as needed for wheezing or shortness of breath. 04/25/22   Shon Hale, MD  apixaban (ELIQUIS) 2.5 MG TABS tablet Take 1 tablet (2.5 mg total) by mouth 2 (two) times daily. 04/25/22   Shon Hale, MD  azelastine (ASTELIN) 0.1 % nasal spray USE 1 SPRAY IN EACH NOSTRIL TWICE DAILY 03/19/22   Delynn Flavin M, DO  Blood Glucose Monitoring Suppl (BLOOD GLUCOSE MONITOR SYSTEM) w/Device KIT 1 Device by Does not apply route 2 (two) times daily. Dx E11.9 05/08/21   Delynn Flavin M, DO  calcium carbonate (OSCAL) 1500 (600 Ca) MG TABS tablet Take by mouth 2 (two) times daily with a meal.    [provider]  clotrimazole-betamethasone (  LOTRISONE) cream APPLY TWICE DAILY TO TORSO FOR UP TO 14 DAYS Patient taking differently: Apply 1 Application topically 2 (two) times daily. 04/19/22   Raliegh IpGottschalk, Ashly M, DO  donepezil (ARICEPT) 5 MG tablet Take 2 tablets (10 mg total) by mouth daily. 04/25/22   Shon HaleEmokpae, Courage, MD  FARXIGA 10 MG TABS tablet TAKE ONE TABLET EACH MORNING BEFORE BREAKFAST 07/08/22   Delynn FlavinGottschalk, Ashly M, DO  ferrous sulfate 325 (65 FE) MG EC tablet Take 1 tablet (325 mg total) by mouth every Monday, Wednesday, and Friday. 04/26/22   Shon HaleEmokpae, Courage, MD  glipiZIDE (GLUCOTROL) 5 MG tablet Take 1 tablet (5 mg total) by mouth 2 (two) times daily before a meal. 04/25/22   Emokpae, Courage, MD  glucose blood (ONETOUCH VERIO) test strip Check BS BID Dx E11.9  11/29/21   Delynn FlavinGottschalk, Ashly M, DO  HYDROcodone-acetaminophen (NORCO) 5-325 MG tablet Take 1-2 tablets by mouth every 6 (six) hours as needed. 07/03/22   Geoffery Lyonselo, Douglas, MD  Lancet Device MISC Use to check BS BID Dx E11.9 05/08/21   Delynn FlavinGottschalk, Ashly M, DO  Lancets [facility](ONETOUCH DELICA PLUS [id number]) MISC Check BS BID Dx E11.9 11/29/21   Raliegh IpGottschalk, Ashly M, DO  levothyroxine (SYNTHROID) 100 MCG tablet TAKE ONE (1) TABLET EACH DAY Patient taking differently: Take 100 mcg by mouth daily before breakfast. 04/18/22   Gottschalk, Kathie RhodesAshly M, DO  lidocaine (LIDODERM) 5 % APPLY 1 PATCH AND LEAVE ON FOR 12 HOURS THEN OFF FOR 12 HOURS Patient not taking: Reported on 05/15/2022 06/18/21   Raliegh IpGottschalk, Ashly M, DO  loperamide (IMODIUM) 2 MG capsule TAKE 2 CAPSULES BY MOUTH AS NEEDED FOR DIARRHEA/LOOSE STOOL. FOLLOWED BY 1 CAPSULE AFTER EACH LOOSE STOOL (MAX OF 8 CAPSULES DAILY) 07/08/22   Delynn FlavinGottschalk, Ashly M, DO  losartan (COZAAR) 25 MG tablet Take 0.5 tablets (12.5 mg total) by mouth daily. 04/25/22   Shon HaleEmokpae, Courage, MD  metoprolol succinate (TOPROL-XL) 25 MG 24 hr tablet Take 0.5 tablets (12.5 mg total) by mouth daily. 04/25/22   Shon HaleEmokpae, Courage, MD  Multiple Vitamin (MULTIVITAMIN WITH MINERALS) TABS Take 1 tablet by mouth daily.    [provider]  nitroGLYCERIN (NITROSTAT) 0.4 MG SL tablet DISSOLVE 1 TAB UNDER TOUNGE FOR CHEST PAIN. MAY REPEAT EVERY 5 MINUTES FOR 3 DOSES. IF NO RELIEF CALL 911 OR GO TO ER 05/16/22   Delynn FlavinGottschalk, Ashly M, DO  ondansetron (ZOFRAN-ODT) 4 MG disintegrating tablet TAKE ONE TABLET BY MOUTH EVERY EIGHT HOURS AS NEEDED 07/08/22   Delynn FlavinGottschalk, Ashly M, DO  pantoprazole (PROTONIX) 40 MG tablet Take 1 tablet (40 mg total) by mouth daily. 04/25/22   Shon HaleEmokpae, Courage, MD  risperiDONE (RISPERDAL) 0.5 MG tablet Take 1 tablet (0.5 mg total) by mouth daily. 04/26/22   Shon HaleEmokpae, Courage, MD  risperiDONE (RISPERDAL) 1 MG tablet Take 1 tablet (1 mg total) by mouth at bedtime. 04/25/22   Shon HaleEmokpae, Courage, MD   rosuvastatin (CRESTOR) 10 MG tablet TAKE ONE (1) TABLET EACH DAY Patient taking differently: Take 10 mg by mouth daily. 02/20/22   Raliegh IpGottschalk, Ashly M, DO  RYBELSUS 7 MG TABS TAKE ONE CAPSULE BY MOUTH DAILY 07/08/22   Delynn FlavinGottschalk, Ashly M, DO  spironolactone (ALDACTONE) 25 MG tablet Take 0.5 tablets (12.5 mg total) by mouth daily. 04/25/22 04/25/23  Shon HaleEmokpae, Courage, MD  SYMBICORT 80-4.5 MCG/ACT inhaler Inhale 2 puffs into the lungs 2 (two) times daily. 04/25/22   Shon HaleEmokpae, Courage, MD  torsemide (DEMADEX) 20 MG tablet Take 2 tablets (40 mg total) by mouth daily. 04/25/22  Shon Hale, MD      Allergies    Codeine, Latex, Morphine, and Lisinopril    Review of Systems   Review of Systems  Constitutional:  Negative for fever.  Cardiovascular:  Positive for chest pain.    Physical Exam Updated Vital Signs BP 102/64   Pulse 76   Temp 98.1 F (36.7 C) (Oral)   Resp (!) 21   Ht 1.727 m (5\' 8" )   Wt 91.2 kg   SpO2 95%   BMI 30.56 kg/m  Physical Exam CONSTITUTIONAL: Elderly, chronically ill-appearing HEAD: Normocephalic/atraumatic EYES: EOMI ENMT: Mucous membranes moist NECK: supple no meningeal signs CV: S1/S2 noted, no murmurs/rubs/gallops noted LUNGS: Currently on 2 L nasal cannula, scattered wheezing bilaterally ABDOMEN: soft, nontender, no rebound or guarding, bowel sounds noted throughout abdomen GU:no cva tenderness NEURO: Pt is awake/alert/appropriate, moves all extremitiesx4.  No facial droop.   EXTREMITIES: pulses normal/equal, full ROM No significant lower extremity edema.  Chronic skin changes noted SKIN: warm, color normal PSYCH: anxious ED Results / Procedures / Treatments   Labs (all labs ordered are listed, but only abnormal results are displayed) Labs Reviewed  BASIC METABOLIC PANEL - Abnormal; Notable for the following components:      Result Value   Chloride 96 (*)    Glucose, Bld 158 (*)    BUN 35 (*)    Creatinine, Ser 1.46 (*)    Calcium 8.4 (*)     GFR, Estimated 48 (*)    All other components within normal limits  CBC - Abnormal; Notable for the following components:   RBC 4.02 (*)    Hemoglobin 12.5 (*)    Platelets 126 (*)    All other components within normal limits  TROPONIN I (HIGH SENSITIVITY) - Abnormal; Notable for the following components:   Troponin I (High Sensitivity) 33 (*)    All other components within normal limits  BRAIN NATRIURETIC PEPTIDE    EKG EKG Interpretation  Date/Time:  Tuesday July 23 2022 05:34:22 EDT Ventricular Rate:  99 PR Interval:    QRS Duration: 177 QT Interval:  427 QTC Calculation: 548 R Axis:   -80 Text Interpretation: Atrial fibrillation Multiple ventricular premature complexes Nonspecific IVCD with LAD Left ventricular hypertrophy Confirmed by Zadie Rhine ([id number]) on 07/23/2022 5:39:14 AM  Radiology DG Chest Portable 1 View  Result Date: 07/23/2022 CLINICAL DATA:  Chest pain and shortness of breath. EXAM: PORTABLE CHEST 1 VIEW COMPARISON:  CT 07/03/2022 FINDINGS: Cardiac enlargement, stable. Aortic atherosclerosis. Status post TAVR. Low lung volumes. Mild interstitial edema. No definite pleural effusion or airspace disease. Visualized osseous structures are unremarkable. IMPRESSION: Cardiac enlargement and mild interstitial edema concerning for CHF. Electronically Signed   By: Signa Kell M.D.   On: 07/23/2022 06:52    Procedures Procedures    Medications Ordered in ED Medications  furosemide (LASIX) injection 20 mg (has no administration in time range)  ipratropium-albuterol (DUONEB) 0.5-2.5 (3) MG/3ML nebulizer solution 3 mL (3 mLs Nebulization Given 07/23/22 0092)    ED Course/ Medical Decision Making/ A&P Clinical Course as of 07/23/22 0705  Tue Jul 23, 2022  0630 I spoke to the patient's wife [place] via the phone with a long conversation about the patient.  She reports that he will frequently call 911 when it is not necessary.  She reports he has dementia which has  made managing his symptoms at home more difficult.  He has refused placement previously.  Tonight she reports she went to bed, woke up  later on and he reported he called 911.  She reports he called them twice.  He had otherwise been at his health baseline, but he is chronically ill with COPD and is on oxygen.  She reports due to his health issues, she must manage all of his care at home.  She has tried home health before but did not had successful results.  She is also frustrated with his PCP. [DW]  [phone] I advised patient's wife of the plan to check labs and x-ray.  However there if there is no obvious indication for admission, we will be calling back for discharge home.  I will also send a message to his PCP [DW]  [phone] Patient reports feeling improved, though does have signs of CHF on x-ray Plan for diuresis, reassessment.  Will also check BNP The patient continues to improve without any further oxygen requirement, he may be amenable for discharge home Signed out to Dr Jeraldine Loots at shift change  [DW]    Clinical Course User Index [DW] Zadie Rhine, MD                             Medical Decision Making Amount and/or Complexity of Data Reviewed Labs: ordered. Radiology: ordered.  Risk Prescription drug management.   This patient presents to the ED for concern of chest pain, this involves an extensive number of treatment options, and is a complaint that carries with it a high risk of complications and morbidity.  The differential diagnosis includes but is not limited to acute coronary syndrome, aortic dissection, pulmonary embolism, pericarditis, pneumothorax, pneumonia, myocarditis, pleurisy, esophageal rupture    Comorbidities that complicate the patient evaluation: Patient's presentation is complicated by their history of COPD, CAD, aortic stenosis  Social Determinants of Health: Patient's  memory loss   increases the complexity of managing their presentation  Additional history  obtained: Additional history obtained from spouse Records reviewed previous admission documents and Primary Care Documents Care everywhere records revealed cardiac cath last year revealed moderate nonobstructive CAD and patent stents with known AVR recommended to maximize medical therapy  Lab Tests: I Ordered, and personally interpreted labs.  The pertinent results include: Renal insufficiency  Imaging Studies ordered: I ordered imaging studies including X-ray chest   I independently visualized and interpreted imaging which showed mild CHF I agree with the radiologist interpretation  Cardiac Monitoring: The patient was maintained on a cardiac monitor.  I personally viewed and interpreted the cardiac monitor which showed an underlying rhythm of:  Atrial Fibrillation  Medicines ordered and prescription drug management: I ordered medication including Lasix for CHF   Reevaluation: After the interventions noted above, I reevaluated the patient and found that they have :stayed the same  Complexity of problems addressed: Patient's presentation is most consistent with  acute presentation with potential threat to life or bodily function         Final Clinical Impression(s) / ED Diagnoses Final diagnoses:  Acute on chronic systolic congestive heart failure    Rx / DC Orders ED Discharge Orders     None         Zadie Rhine, MD 07/23/22 [phone]

## 2022-07-29 ENCOUNTER — Telehealth: Payer: Self-pay | Admitting: *Deleted

## 2022-07-29 NOTE — Telephone Encounter (Signed)
        Patient  visited [facility] ed on 07/23/2022  for treatment   Telephone encounter attempt :    No answer Alois Cliche -[facility], Population Health [phone] [address] [place] , [place] [address] Email : [email or web address]

## 2022-07-30 ENCOUNTER — Telehealth: Payer: Self-pay | Admitting: *Deleted

## 2022-07-30 NOTE — Telephone Encounter (Signed)
     Patient  visit on 07/23/2022  at [facility] [place]  was for treatment   Have you been able to follow up with your primary care physician? Doing a wellness check tomorrow with pcp  The patient was or was not able to obtain any needed medicine or equipment.  Are there diet recommendations that you are having difficulty following?  Patient expresses understanding of discharge instructions and education provided has no other needs at this time.   Matthew Mao Greenauer -[facility] [place], Population Health [phone] [address] [place] , [place] [address] Email : [email or web address]. [email or web address]

## 2022-07-31 ENCOUNTER — Telehealth: Payer: Self-pay | Admitting: Family Medicine

## 2022-07-31 ENCOUNTER — Ambulatory Visit (INDEPENDENT_AMBULATORY_CARE_PROVIDER_SITE_OTHER): Payer: 59

## 2022-07-31 VITALS — Ht 68.0 in | Wt 200.0 lb

## 2022-07-31 DIAGNOSIS — Z0000 Encounter for general adult medical examination without abnormal findings: Secondary | ICD-10-CM

## 2022-07-31 NOTE — Telephone Encounter (Signed)
Spoke with Vernona Rieger who completed patients AWV today and she stated that patients wife was interested in possible enrolling patient into Hospice. I have called and left message with wife to call me back to discuss to see if she wanted [place] to place a referral for her.

## 2022-07-31 NOTE — Patient Instructions (Signed)
Matthew Campos , Thank you for taking time to come for your Medicare Wellness Visit. I appreciate your ongoing commitment to your health goals. Please review the following plan we discussed and let me know if I can assist you in the future.   These are the goals we discussed:  Goals       .Manage emotions.  Manage depression symptoms. Manage anxiety issues      Timeframe:  Short-Term Goal Priority:  High  Progress: Not On Track Start Date:           06/04/21                   Expected End Date:          12/06/21                  Follow Up Date 10/22/21 at 3:00 PM    Manage Emotions. Manage Depression symptoms . Manage anxiety issues   Why is this important?   When you are stressed, down or upset, your body reacts too.  For example, your blood pressure may get higher; you may have a headache or stomachache.  When your emotions get the best of you, your body's ability to fight off cold and flu gets weak.  These steps will help you manage your emotions.     Patient Self Care Activities:  Completes ADLs as able Attends scheduled medical appointments  Patient Coping Strengths:  Support received from his wife, Matthew Campos Support received from his son.  Patient Self Care Deficits:  Walking challenges  Patient Goals:  - spend time or talk with others at least 2 to 3 times per week - practice relaxation or meditation daily - keep a calendar with appointment dates  Follow Up Plan: Matthew Campos to call client or spouse of client on 10/22/21 at 3:00 PM       Assistance with Transportation (pt-stated)      Transportation assistance needs in patient with CHF, COPD, and DM.  Current Barriers:  Knowledge Deficits related to transportation assistance  Nurse Case Manager Clinical Goal(s):  Over the next 7 days, patient/wife will work with CCM team regarding transportation assistance   Interventions:  Outreach from PCP regarding ongoing transportation needs Chart reviewed Reached out to Care  Guide for assistance with transportation needs. Prior referral reviewed and new notes documented for Care Guide's information.  RN will discuss further with Care Guide after she has more information RN will f/u with patient/family after more information has been provided on transportation services  Patient Self Care Activities:  Self administers medications as prescribed Calls provider office for new concerns or questions Wife provides most care. Patient is wheelchair bound Unable to independently walk   Please see past updates related to this goal by clicking on the "Past Updates" button in the selected goal        Caregiver Strain      Current Barriers:  Lacks caregiver support.  Corporate treasurer.  Transportation barriers  Nurse Case Manager Clinical Goal(s):  Over the next 30 days, wife will talk with Matthew Campos regarding caregiver strain  Interventions:  Discussed current stress Recommended that she talk with Matthew Campos, Matthew Campos. She will consider it.   Initial goal documentation       Chronic Disease Management Needs      Current Barriers:  Chronic Disease Management support, education, and care coordination needs related to HTN, CAD, AAA, CHF, COPD, OSA, DM, hyperlipidemia, anxiety  Clinical Goal(s) related  to HTN, CAD, AAA, CHF, COPD, OSA, DM, hyperlipidemia, anxiety:  Over the next 30 days, patient will:  Work with the care management team to address educational, disease management, and care coordination needs  Begin or continue self health monitoring activities as directed today Measure and record weight daily Call provider office for new or worsened signs and symptoms Weight outside established parameters Call care management team with questions or concerns Verbalize basic understanding of patient centered plan of care established today  Interventions related to HTN, CAD, AAA, CHF, COPD, OSA, DM, hyperlipidemia, anxiety:  Evaluation of current treatment plans and  patient's adherence to plan as established by provider Assessed patient understanding of disease states Assessed patient's education and care coordination needs Provided disease specific education to patient  Collaborated with appropriate clinical care team members regarding patient needs  Patient Self Care Activities related to HTN, CAD, AAA, CHF, COPD, OSA, DM, hyperlipidemia, anxiety:  Patient is unable to independently self-manage chronic health conditions  Initial goal documentation       Client will talk with Matthew Campos in next 30 days to discuss depression symptoms of client (pt-stated)      Current Barriers:  Breathing challenges of client Transportation challenges in client with Chronic Diagnoses of COPD, CAD, GERD, HLD, HTN, MDD, DM Mobility challenges   Clinical Social Work Clinical Goal(s):  Matthew Campos will talk with client in next 4 weeks to discuss client management of depression symptoms faced  Interventions: Talked with Matthew Campos, spouse, about transport needs of client Talked with Matthew Campos about current client needs (client is currently a patient at [facility] Talked with Matthew Campos about RNCM support and Matthew Campos support Talked with Matthew Campos about Matthew Campos application process for Matthew Campos Talked with Matthew Campos about financial needs of client  Patient Self Care Activities:  {CCM SELF CARE ACTIVITIES  Enjoys speaking via phone with his wife  {CCM SELF CARE DEFICITS:       Needs help with daily ADLs       Needs help with medication administration  Initial Care Plan documentation      DIET - INCREASE WATER INTAKE      High Risk for Falls      Current Barriers:  Knowledge Deficits related to fall precautions Decreased adherence to prescribed treatment for fall prevention Wife is much smaller and isn't able to help him transfer well  Nurse Case Manager Clinical Goal(s):  Over the next 30 days, patient will demonstrate improved adherence to  prescribed treatment plan for decreasing falls as evidenced by patient reporting and review of EMR Over the next 30 days, patient will verbalize using fall risk reduction strategies discussed Over the next 90 days, patient will not experience additional falls Over the next 30 days, patient will continue to work with PT to build up strength and stability  Interventions:  Provided written and verbal education re: Potential causes of falls and Fall prevention strategies Reviewed medications and discussed potential side effects of medications such as dizziness and frequent urination Assessed for s/s of orthostatic hypotension Assessed for falls since last encounter. Assessed patients knowledge of fall risk prevention secondary to previously provided education.  Patient Self Care Activities:  Utilize wheelchair (assistive device) appropriately with all ambulation De-clutter walkways Change positions slowly Wear secure fitting shoes at all times with ambulation Utilize home lighting for dim lit areas Have self and pet awareness at all times  Plan: CCM RN CM will follow up in 30   Initial goal documentation  HTN.CHF Management [facility]) (pt-stated)      Care Coordination Interventions: Interventions Today    Flowsheet Row Most Recent Value  Chronic Disease   Chronic disease during today's visit Congestive Heart Failure (CHF), Other  General Interventions   General Interventions Discussed/Reviewed Communication with  Communication with RN  [returned a call to leave a message for Matthew Campos Cardiology RN [facility] Left message with Matthew Campos]           Need for Campos Bed      CARE PLAN ENTRY (see longtitudinal plan of care for additional care plan information)  Need for Campos bed in patient with DM, CHF, COPD, and bladder cancer  Current Barriers:  Knowledge Deficits related to process for obtaining Campos bed Decreased mobility  Nurse Case Manager Clinical Goal(s):  Over the  next 15 days, patient will work with DME supplier to obtain Campos bed  Interventions:  Consulted by Matthew Campos, Matthew Campos who spoke with patient and his wife today They explained that an order was sent from [facility] to [facility] but that Matthew Campos doesn't supply Campos beds Requesting that it be sent somewhere else, like [facility] Communicated to [employer], Matthew Campos at [facility] that patient would like order sent to [facility]  Patient Self Care Activities:  Has assistance from wife with ADLs  Initial goal documentation        Oxygen Therapy      Current Barriers:  Need for increased oxygen therapy  Nurse Case Manager Clinical Goal(s):  Over the next 24 hours, patient will increase oxygen to 3L/min Over the next 7 days, patient will see improvement in oxygen levels and shortness of breath  Interventions:  Chart reviewed Advised that order for increased oxygen was sent to [facility] Evaluation of current treatment plan related to oxygen therapy and patient's adherence to plan as established by provider. 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   Interventions: 1:1 collaboration with Matthew Ip, Matthew Campos regarding development and update  of comprehensive plan of care as evidenced by provider attestation and co-signature Inter-disciplinary care team collaboration (see longitudinal plan of care) Comprehensive medication review performed; medication list updated in electronic medical record  Diabetes: Controlled; current treatment: Rybelsus 7mg , farxiga 10mg  , glipizide;  A1C IMPROVED TO 6.7% , GFR 44-->41 Denies personal and family history of Medullary thyroid cancer (MTC) Patient's wife reports blood sugars have improved 140-175. Counseled on diet.  He is taking rybelsus as prescribed.  Will continue 7mg  daily.  Patient has supply at home and back up RX was sent to the drug store.  Patient is now suffering from dementia (new start Aricept). 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  Continue Rybelsus 7mg  daily  WOULD LIKE TO DROP THE GLIPIZIDE Current glucose readings: fasting glucose: 140-175;  post prandial glucose: <  180 Denies hypoglycemic/hyperglycemic symptoms Discussed meal planning options and Plate method for healthy eating Avoid sugary drinks and desserts Incorporate balanced protein, non starchy veggies, 1 serving of carbohydrate with each meal Increase water intake Increase physical activity as able Current exercise: N/A Educated on MEDICATIONS (PURPOSE AND SIDE EFFECTS); diet/lifestyle modifications Recommended continue current therapy Reviewed lipid panel/HLD-->continue rosuvastatin  Patient Goals/Self-Care Activities Over the next 90 days, patient will:  - take medications as prescribed check glucose DAILY (FASTING) OR IF SYMPTOMATIC, document, and provide at future appointments  Follow Up Plan: Telephone follow up appointment with care management team member scheduled for: 3 months         This is a list of the screening recommended for you and due dates:  Health Maintenance  Topic Date Due   Zoster (Shingles) Vaccine (1 of 2) Never done   COVID-19 Vaccine (4 - 2023-24 season) 12/14/2021    Complete foot exam   04/19/2022   Hemoglobin A1C  11/04/2022   Flu Shot  11/14/2022   Yearly kidney health urinalysis for diabetes  02/07/2023   Eye exam for diabetics  05/31/2023   Yearly kidney function blood test for diabetes  07/23/2023   Medicare Annual Wellness Visit  07/31/2023   DTaP/Tdap/Td vaccine (4 - Td or Tdap) 04/05/2032   Pneumonia Vaccine  Completed   HPV Vaccine  Aged Out    Advanced directives: Advance directive discussed with you today. I have provided a copy for you to complete at home and have notarized. Once this is complete please bring a copy in to our office so we can scan it into your chart.   Conditions/risks identified: Aim for 30 minutes of exercise or brisk walking, 6-8 glasses of water, and 5 servings of fruits and vegetables each day.   Next appointment: Follow up in one year for your annual wellness visit.   Preventive Care 80 Years and Older, Male  Preventive care refers to lifestyle choices and visits with your health care provider that can promote health and wellness. What does preventive care include? A yearly physical exam. This is also called an annual well check. Dental exams once or twice a year. Routine eye exams. Ask your health care provider how often you should have your eyes checked. Personal lifestyle choices, including: Daily care of your teeth and gums. Regular physical activity. Eating a healthy diet. Avoiding tobacco and drug use. Limiting alcohol use. Practicing safe sex. Taking low doses of aspirin every day. Taking vitamin and mineral supplements as recommended by your health care provider. What happens during an annual well check? The services and screenings done by your health care provider during your annual well check will depend on your age, overall health, lifestyle risk factors, and family history of disease. Counseling  Your health care provider may ask you questions about your: Alcohol use. Tobacco use. Drug  use. Emotional well-being. Home and relationship well-being. Sexual activity. Eating habits. History of falls. Memory and ability to understand (cognition). Work and work Astronomer. Screening  You may have the following tests or measurements: Height, weight, and BMI. Blood pressure. Lipid and cholesterol levels. These may be checked every 5 years, or more frequently if you are over 42 years old. Skin check. Lung cancer screening. You may have this screening every year starting at age 58 if you have a 30-pack-year history of smoking and currently smoke or have quit within the past 15 years. Fecal occult blood test (FOBT) of the stool. You may have this  test every year starting at age 64. Flexible sigmoidoscopy or colonoscopy. You may have a sigmoidoscopy every 5 years or a colonoscopy every 10 years starting at age 42. Prostate cancer screening. Recommendations will vary depending on your family history and other risks. Hepatitis C blood test. Hepatitis B blood test. Sexually transmitted disease (STD) testing. Diabetes screening. This is done by checking your blood sugar (glucose) after you have not eaten for a while (fasting). You may have this done every 1-3 years. Abdominal aortic aneurysm (AAA) screening. You may need this if you are a current or former smoker. Osteoporosis. You may be screened starting at age 61 if you are at high risk. Talk with your health care provider about your test results, treatment options, and if necessary, the need for more tests. Vaccines  Your health care provider may recommend certain vaccines, such as: Influenza vaccine. This is recommended every year. Tetanus, diphtheria, and acellular pertussis (Tdap, Td) vaccine. You may need a Td booster every 10 years. Zoster vaccine. You may need this after age 54. Pneumococcal 13-valent conjugate (PCV13) vaccine. One dose is recommended after age 59. Pneumococcal polysaccharide (PPSV23) vaccine. One dose is  recommended after age 2. Talk to your health care provider about which screenings and vaccines you need and how often you need them. This information is not intended to replace advice given to you by your health care provider. Make sure you discuss any questions you have with your health care provider. Document Released: 04/28/2015 Document Revised: 12/20/2015 Document Reviewed: 01/31/2015 Elsevier Interactive Patient Education  2017 [employer].  Fall Prevention in the Home Falls can cause injuries. They can happen to people of all ages. There are many things you can Matthew Campos to make your home safe and to help prevent falls. What can I Matthew Campos on the outside of my home? Regularly fix the edges of walkways and driveways and fix any cracks. Remove anything that might make you trip as you walk through a door, such as a raised step or threshold. Trim any bushes or trees on the path to your home. Use bright outdoor lighting. Clear any walking paths of anything that might make someone trip, such as rocks or tools. Regularly check to see if handrails are loose or broken. Make sure that both sides of any steps have handrails. Any raised decks and porches should have guardrails on the edges. Have any leaves, snow, or ice cleared regularly. Use sand or salt on walking paths during winter. Clean up any spills in your garage right away. This includes oil or grease spills. What can I Matthew Campos in the bathroom? Use night lights. Install grab bars by the toilet and in the tub and shower. Matthew Campos not use towel bars as grab bars. Use non-skid mats or decals in the tub or shower. If you need to sit down in the shower, use a plastic, non-slip stool. Keep the floor dry. Clean up any water that spills on the floor as soon as it happens. Remove soap buildup in the tub or shower regularly. Attach bath mats securely with double-sided non-slip rug tape. Matthew Campos not have throw rugs and other things on the floor that can make you  trip. What can I Matthew Campos in the bedroom? Use night lights. Make sure that you have a light by your bed that is easy to reach. Matthew Campos not use any sheets or blankets that are too big for your bed. They should not hang down onto the floor. Have a firm chair that  has side arms. You can use this for support while you get dressed. Matthew Campos not have throw rugs and other things on the floor that can make you trip. What can I Matthew Campos in the kitchen? Clean up any spills right away. Avoid walking on wet floors. Keep items that you use a lot in easy-to-reach places. If you need to reach something above you, use a strong step stool that has a grab bar. Keep electrical cords out of the way. Matthew Campos not use floor polish or wax that makes floors slippery. If you must use wax, use non-skid floor wax. Matthew Campos not have throw rugs and other things on the floor that can make you trip. What can I Matthew Campos with my stairs? Matthew Campos not leave any items on the stairs. Make sure that there are handrails on both sides of the stairs and use them. Fix handrails that are broken or loose. Make sure that handrails are as long as the stairways. Check any carpeting to make sure that it is firmly attached to the stairs. Fix any carpet that is loose or worn. Avoid having throw rugs at the top or bottom of the stairs. If you Matthew Campos have throw rugs, attach them to the floor with carpet tape. Make sure that you have a light switch at the top of the stairs and the bottom of the stairs. If you Matthew Campos not have them, ask someone to add them for you. What else can I Matthew Campos to help prevent falls? Wear shoes that: Matthew Campos not have high heels. Have rubber bottoms. Are comfortable and fit you well. Are closed at the toe. Matthew Campos not wear sandals. If you use a stepladder: Make sure that it is fully opened. Matthew Campos not climb a closed stepladder. Make sure that both sides of the stepladder are locked into place. Ask someone to hold it for you, if possible. Clearly mark and make sure that you can  see: Any grab bars or handrails. First and last steps. Where the edge of each step is. Use tools that help you move around (mobility aids) if they are needed. These include: Canes. Walkers. Scooters. Crutches. Turn on the lights when you go into a dark area. Replace any light bulbs as soon as they burn out. Set up your furniture so you have a clear path. Avoid moving your furniture around. If any of your floors are uneven, fix them. If there are any pets around you, be aware of where they are. Review your medicines with your doctor. Some medicines can make you feel dizzy. This can increase your chance of falling. Ask your doctor what other things that you can Matthew Campos to help prevent falls. This information is not intended to replace advice given to you by your health care provider. Make sure you discuss any questions you have with your health care provider. Document Released: 01/26/2009 Document Revised: 09/07/2015 Document Reviewed: 05/06/2014 Elsevier Interactive Patient Education  2017 [employer].

## 2022-07-31 NOTE — Progress Notes (Signed)
Subjective:   Matthew Campos is a 82 y.o. male who presents for Medicare Annual/Subsequent preventive examination. I connected with  Matthew Campos on 07/31/22 by a audio enabled telemedicine application and verified that I am speaking with the correct person using two identifiers.  Patient Location: Home  Provider Location: Home Office  I discussed the limitations of evaluation and management by telemedicine. The patient expressed understanding and agreed to proceed.  Review of Systems     Cardiac Risk Factors include: advanced age (>59men, >74 women);male gender;diabetes mellitus;dyslipidemia;hypertension     Objective:    Today's Vitals   07/31/22 1032  Weight: 200 lb (90.7 kg)  Height: 5\' 8"  (1.727 m)   Body mass index is 30.41 kg/m.     07/31/2022   10:37 AM 07/03/2022   12:03 AM 05/15/2022    8:55 AM 04/22/2022    2:12 PM 04/21/2022    3:32 PM 03/01/2022    8:44 PM 07/27/2021   11:59 AM  Advanced Directives  Does Patient Have a Medical Advance Directive? No No No  No No No  Would patient like information on creating a medical advance directive? No - Patient declined  No - Patient declined No - Patient declined   No - Patient declined    Current Medications (verified) Outpatient Encounter Medications as of 07/31/2022  Medication Sig   albuterol (PROVENTIL) (2.5 MG/3ML) 0.083% nebulizer solution Take 3 mLs (2.5 mg total) by nebulization every 4 (four) hours as needed for wheezing or shortness of breath.   albuterol (VENTOLIN HFA) 108 (90 Base) MCG/ACT inhaler Inhale 2 puffs into the lungs every 6 (six) hours as needed for wheezing or shortness of breath.   apixaban (ELIQUIS) 2.5 MG TABS tablet Take 1 tablet (2.5 mg total) by mouth 2 (two) times daily.   azelastine (ASTELIN) 0.1 % nasal spray USE 1 SPRAY IN EACH NOSTRIL TWICE DAILY   Blood Glucose Monitoring Suppl (BLOOD GLUCOSE MONITOR SYSTEM) w/Device KIT 1 Device by Does not apply route 2 (two) times daily. Dx E11.9    calcium carbonate (OSCAL) 1500 (600 Ca) MG TABS tablet Take by mouth 2 (two) times daily with a meal.   clotrimazole-betamethasone (LOTRISONE) cream APPLY TWICE DAILY TO TORSO FOR UP TO 14 DAYS (Patient taking differently: Apply 1 Application topically 2 (two) times daily.)   donepezil (ARICEPT) 5 MG tablet Take 2 tablets (10 mg total) by mouth daily.   FARXIGA 10 MG TABS tablet TAKE ONE TABLET EACH MORNING BEFORE BREAKFAST   ferrous sulfate 325 (65 FE) MG EC tablet Take 1 tablet (325 mg total) by mouth every Monday, Wednesday, and Friday.   glipiZIDE (GLUCOTROL) 5 MG tablet Take 1 tablet (5 mg total) by mouth 2 (two) times daily before a meal.   glucose blood (ONETOUCH VERIO) test strip Check BS BID Dx E11.9   HYDROcodone-acetaminophen (NORCO) 5-325 MG tablet Take 1-2 tablets by mouth every 6 (six) hours as needed.   Lancet Device MISC Use to check BS BID Dx E11.9   Lancets (ONETOUCH DELICA PLUS LANCET33G) MISC Check BS BID Dx E11.9   levothyroxine (SYNTHROID) 100 MCG tablet TAKE ONE (1) TABLET EACH DAY (Patient taking differently: Take 100 mcg by mouth daily before breakfast.)   lidocaine (LIDODERM) 5 % APPLY 1 PATCH AND LEAVE ON FOR 12 HOURS THEN OFF FOR 12 HOURS   loperamide (IMODIUM) 2 MG capsule TAKE 2 CAPSULES BY MOUTH AS NEEDED FOR DIARRHEA/LOOSE STOOL. FOLLOWED BY 1 CAPSULE AFTER EACH LOOSE STOOL (  MAX OF 8 CAPSULES DAILY)   losartan (COZAAR) 25 MG tablet Take 0.5 tablets (12.5 mg total) by mouth daily.   metoprolol succinate (TOPROL-XL) 25 MG 24 hr tablet Take 0.5 tablets (12.5 mg total) by mouth daily.   Multiple Vitamin (MULTIVITAMIN WITH MINERALS) TABS Take 1 tablet by mouth daily.   nitroGLYCERIN (NITROSTAT) 0.4 MG SL tablet DISSOLVE 1 TAB UNDER TOUNGE FOR CHEST PAIN. MAY REPEAT EVERY 5 MINUTES FOR 3 DOSES. IF NO RELIEF CALL 911 OR GO TO ER   ondansetron (ZOFRAN-ODT) 4 MG disintegrating tablet TAKE ONE TABLET BY MOUTH EVERY EIGHT HOURS AS NEEDED   pantoprazole (PROTONIX) 40 MG  tablet Take 1 tablet (40 mg total) by mouth daily.   risperiDONE (RISPERDAL) 0.5 MG tablet Take 1 tablet (0.5 mg total) by mouth daily.   risperiDONE (RISPERDAL) 1 MG tablet Take 1 tablet (1 mg total) by mouth at bedtime.   rosuvastatin (CRESTOR) 10 MG tablet TAKE ONE (1) TABLET EACH DAY (Patient taking differently: Take 10 mg by mouth daily.)   RYBELSUS 7 MG TABS TAKE ONE CAPSULE BY MOUTH DAILY   spironolactone (ALDACTONE) 25 MG tablet Take 0.5 tablets (12.5 mg total) by mouth daily.   SYMBICORT 80-4.5 MCG/ACT inhaler Inhale 2 puffs into the lungs 2 (two) times daily.   torsemide (DEMADEX) 20 MG tablet Take 2 tablets (40 mg total) by mouth daily.   No facility-administered encounter medications on file as of 07/31/2022.    Allergies (verified) Codeine, Latex, Morphine, and Lisinopril   History: 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    Anemia    Anxiety and depression    Aortic stenosis    Carotid artery disease    CHF (congestive heart failure)    Cholecystitis    Chronic ischemic heart disease    Complication of anesthesia    hard to be put to sleep   COPD (chronic obstructive pulmonary disease)    Coronary artery disease    Diabetes mellitus without complication    Diabetes mellitus without complication    takes Januvia and Metformin daily   GERD (gastroesophageal reflux disease)    GERD (gastroesophageal reflux disease)    takes omeprazole daily   Headache(784.0)    sinus   History of bronchitis    last time >75yrs ago   Hyperlipidemia    takes Lipitor daily   Hypertension    Hypertension    takes Metoprolol daily   Infected sebaceous cyst 01/30/2021   Joint pain    Myocardial infarction    x 3;last one about 3-66yrs ago   Neoplasm of uncertain behavior of bladder    Obesity    OSA (obstructive sleep apnea)    Pacemaker    Pancytopenia    Paroxysmal atrial  fibrillation    Pneumonia    last itme about 78yrs ago   S/P TAVR (transcatheter aortic valve replacement)    Severe aortic stenosis    Sinus bradycardia    Past Surgical History:  Procedure Laterality Date   abdominal aneurysm stenting     ABDOMINAL AORTIC ANEURYSM REPAIR     per patient stents placed about 4-5 years ago   AORTIC VALVE REPLACEMENT     stated it was done in November   cholecystostomy tube     CORONARY ANGIOPLASTY  2010   CORONARY ANGIOPLASTY WITH STENT PLACEMENT     about 30 years  ago per patient   cyst removed from left wrist     ESOPHAGOGASTRODUODENOSCOPY (EGD) WITH PROPOFOL N/A 04/15/2019   Procedure: ESOPHAGOGASTRODUODENOSCOPY (EGD) WITH PROPOFOL;  Surgeon: West Bali, MD;  Location: [facility];  Service: Endoscopy;  Laterality: N/A;  possible dilation   HERNIA REPAIR     left shoulder surgery     POLYPECTOMY  04/15/2019   Procedure: POLYPECTOMY;  Surgeon: West Bali, MD;  Location: [facility];  Service: Endoscopy;;  duodenal    SHOULDER ARTHROSCOPY WITH ROTATOR CUFF REPAIR AND SUBACROMIAL DECOMPRESSION  04/17/2012   Procedure: SHOULDER ARTHROSCOPY WITH ROTATOR CUFF REPAIR AND SUBACROMIAL DECOMPRESSION;  Surgeon: Thera Flake., MD;  Location: [facility];  Service: Orthopedics;  Laterality: Right;  RIGHT SHOULDER ROTATOR CUFF REPAIR INCLUDING ACROMIOPLASTY CHRONIC, ARTHROSCOPY SHOULDER DEBRIDEMENT EXTENSIVE   TONSILLECTOMY     VALVE REPLACEMENT     Family History  Problem Relation Age of Onset   Cancer Mother        Unknown type   Heart disease Father    Diabetes Father    Heart disease Sister    Seizures Brother    Diabetes Daughter    Diabetes Daughter    Heart attack Son    Colon cancer Mother        in her 35s.    Stomach cancer Neg Hx    Esophageal cancer Neg Hx    Social History   Socioeconomic History   Marital status: Married    Spouse name: Not on file   Number of children: Not on file   Years of education: Not on file    Highest education level: Not on file  Occupational History   Occupation: retired  Tobacco Use   Smoking status: Former    Types: Cigarettes   Smokeless tobacco: Never   Tobacco comments:    quit smoking 20+yrs ago  Vaping Use   Vaping Use: Never used  Substance and Sexual Activity   Alcohol use: No    Comment: last drank about 2-3 years ago   Drug use: No   Sexual activity: Yes  Other Topics Concern   Not on file  Social History Narrative   Lives with his wife - their grandson lives with them at times       Social Determinants of Health   Financial Resource Strain: Low Risk  (07/31/2022)   Overall Financial Resource Strain (CARDIA)    Difficulty of Paying Living Expenses: Not hard at all  Food Insecurity: No Food Insecurity (07/31/2022)   Hunger Vital Sign    Worried About Running Out of Food in the Last Year: Never true    Ran Out of Food in the Last Year: Never true  Transportation Needs: No Transportation Needs (05/09/2022)   PRAPARE - Administrator, Civil Service (Medical): No    Lack of Transportation (Non-Medical): No  Physical Activity: Inactive (07/31/2022)   Exercise Vital Sign    Days of Exercise per Week: 0 days    Minutes of Exercise per Session: 0 min  Stress: No Stress Concern Present (07/31/2022)   [employer] of Occupational Health - Occupational Stress Questionnaire    Feeling of Stress : Only a little  Social Connections: Moderately Isolated (07/31/2022)   Social Connection and Isolation Panel [NHANES]    Frequency of Communication with Friends and Family: More than three times a week    Frequency of Social Gatherings with Friends and Family: More than three times  a week    Attends Religious Services: Never    Active Member of Clubs or Organizations: No    Attends Banker Meetings: Never    Marital Status: Married    Tobacco Counseling Counseling given: Not Answered Tobacco comments: quit smoking 20+yrs ago   Clinical  Intake:  Pre-visit preparation completed: Yes  Pain : No/denies pain     Nutritional Risks: None Diabetes: Yes CBG done?: No Did pt. bring in CBG monitor from home?: No  How often do you need to have someone help you when you read instructions, pamphlets, or other written materials from your doctor or pharmacy?: 1 - Never  Diabetic?yes  Nutrition Risk Assessment:  Has the patient had any N/V/D within the last 2 months?  Yes  Does the patient have any non-healing wounds?  Yes  Has the patient had any unintentional weight loss or weight gain?  Yes   Diabetes:  Is the patient diabetic?  Yes  If diabetic, was a CBG obtained today?  No  Did the patient bring in their glucometer from home?  No  How often do you monitor your CBG's? Once a day .   Financial Strains and Diabetes Management:  Are you having any financial strains with the device, your supplies or your medication? No .  Does the patient want to be seen by Chronic Care Management for management of their diabetes?  No  Would the patient like to be referred to a Nutritionist or for Diabetic Management?  No   Diabetic Exams:  Diabetic Eye Exam: Completed 05/2022 Diabetic Foot Exam: Overdue, Pt has been advised about the importance in completing this exam. Pt is scheduled for diabetic foot exam on next office visit .   Interpreter Needed?: No  Information entered by :: Matthew Ora, LPN   Activities of Daily Living    07/31/2022   10:37 AM 04/22/2022    2:00 PM  In your present state of health, do you have any difficulty performing the following activities:  Hearing? 1 0  Vision? 1 0  Difficulty concentrating or making decisions? 1 1  Walking or climbing stairs? 1 1  Dressing or bathing? 1 0  Doing errands, shopping? 1 0  Preparing Food and eating ? Y   Using the Toilet? Y   In the past six months, have you accidently leaked urine? Y   Do you have problems with loss of bowel control? Y   Managing your  Medications? Y   Managing your Finances? Y   Housekeeping or managing your Housekeeping? Y     Patient Care Team: Raliegh Ip, DO as PCP - General (Family Medicine) Cloria Spring, MD as Referring Physician (Internal Medicine) Danella Maiers, [facility] (Pharmacist) Ocie Doyne, MD (Neurology) Clinton Gallant, RN as [facility] Care Management  Indicate any recent Medical Services you may have received from other than [facility] providers in the past year (date may be approximate).     Assessment:   This is a routine wellness examination for C[place]ick.  Hearing/Vision screen Vision Screening - Comments:: Wears rx glasses - up to date with routine eye exams with  Dr.Groat   Dietary issues and exercise activities discussed: Current Exercise Habits: The patient does not participate in regular exercise at present, Exercise limited by: Other - see comments (Demenita)   Goals Addressed             This Visit's Progress    DIET - INCREASE WATER INTAKE  Depression Screen    07/31/2022   10:36 AM 05/09/2022   10:18 AM 05/06/2022   10:08 AM 04/05/2022    3:31 PM 02/20/2022   11:34 AM 02/06/2022   11:18 AM 11/06/2021   10:52 AM  PHQ 2/9 Scores  PHQ - 2 Score 0 0 0 6 6 6 4   PHQ- 9 Score   0 23 27 27 15     Fall Risk    07/31/2022   10:34 AM 05/06/2022   10:01 AM 04/05/2022    3:30 PM 02/20/2022   11:34 AM 11/06/2021   10:51 AM  Fall Risk   Falls in the past year? 1 1 1 1  0  Number falls in past yr: 1 1 1 1    Injury with Fall? 1 1 1 1    Risk for fall due to : History of fall(s);Impaired balance/gait;Orthopedic patient History of fall(s);Impaired mobility History of fall(s) History of fall(s);Impaired balance/gait   Follow up Education provided;Falls prevention discussed Education provided Education provided Falls evaluation completed     FALL RISK PREVENTION PERTAINING TO THE HOME:  Any stairs in or around the home? Yes  If so, are there any without  handrails? No  Home free of loose throw rugs in walkways, pet beds, electrical cords, etc? Yes  Adequate lighting in your home to reduce risk of falls? Yes   ASSISTIVE DEVICES UTILIZED TO PREVENT FALLS:  Life alert? No  Use of a cane, walker or w/c? Yes  Grab bars in the bathroom? Yes  Shower chair or bench in shower? Yes  Elevated toilet seat or a handicapped toilet? No       02/15/2021   10:24 AM 01/15/2021   10:27 AM 02/04/2020    2:00 PM  MMSE - Mini Mental State Exam  Orientation to time 2 4 5   Orientation to Place 3 5 5   Registration 3 3 3   Attention/ Calculation 1 2 3   Recall 1 0 0  Language- name 2 objects 2 2 2   Language- repeat 1 1 1   Language- follow 3 step command 1 3 3   Language- read & follow direction 1 1 0  Write a sentence 0 1 0  Copy design 0 0 1  Total score 15 22 23         07/31/2022   10:38 AM 07/27/2021   11:59 AM 07/26/2020   11:12 AM 07/26/2019   10:46 AM  6CIT Screen  What Year? 4 points 0 points 4 points 4 points  What month? 3 points 0 points 0 points 3 points  What time? 3 points 0 points 0 points 3 points  Count back from 20 4 points 0 points 0 points 0 points  Months in reverse 4 points 4 points 4 points 4 points  Repeat phrase 6 points 4 points 2 points 8 points  Total Score 24 points 8 points 10 points 22 points    Immunizations Immunization History  Administered Date(s) Administered   Fluad Quad(high Dose 65+) 02/29/2016, 01/20/2017, 02/10/2018, 02/04/2020, 01/15/2021, 04/05/2022   Influenza, High Dose Seasonal PF 02/29/2016, 01/20/2017, 02/10/2018   Influenza, Seasonal, Injecte, Preservative Fre 02/03/2014   Influenza,inj,Quad PF,6+ Mos 01/01/2019   Influenza-Unspecified 02/03/2014, 02/29/2016, 01/20/2017, 01/20/2017, 02/10/2018, 02/10/2018   Moderna Sars-Covid-2 Vaccination 07/05/2019, 08/02/2019, 02/23/2020   PNEUMOCOCCAL CONJUGATE-20 01/15/2021   Pneumococcal Conjugate-13 11/25/2014, 11/25/2014   Pneumococcal Polysaccharide-23  05/30/2016, 05/30/2016, 01/01/2019   Pneumococcal-Unspecified 05/30/2016   Td 04/05/2022   Tdap 11/25/2014, 11/25/2014    TDAP status: Up to date  Flu Vaccine status: Up to date  Pneumococcal vaccine status: Up to date  Covid-19 vaccine status: Declined, Education has been provided regarding the importance of this vaccine but patient still declined. Advised may receive this vaccine at local pharmacy or Health Dept.or vaccine clinic. Aware to provide a copy of the vaccination record if obtained from local pharmacy or Health Dept. Verbalized acceptance and understanding.  Qualifies for Shingles Vaccine? Yes   Zostavax completed No   Shingrix Completed?: No.    Education has been provided regarding the importance of this vaccine. Patient has been advised to call insurance company to determine out of pocket expense if they have not yet received this vaccine. Advised may also receive vaccine at local pharmacy or Health Dept. Verbalized acceptance and understanding.  Screening Tests Health Maintenance  Topic Date Due   Zoster Vaccines- Shingrix (1 of 2) Never done   COVID-19 Vaccine (4 - 2023-24 season) 12/14/2021   FOOT EXAM  04/19/2022   HEMOGLOBIN A1C  11/04/2022   INFLUENZA VACCINE  11/14/2022   Diabetic kidney evaluation - Urine ACR  02/07/2023   OPHTHALMOLOGY EXAM  05/31/2023   Diabetic kidney evaluation - eGFR measurement  07/23/2023   Medicare Annual Wellness (AWV)  07/31/2023   DTaP/Tdap/Td (4 - Td or Tdap) 04/05/2032   Pneumonia Vaccine 17+ Years old  Completed   HPV VACCINES  Aged Out    Health Maintenance  Health Maintenance Due  Topic Date Due   Zoster Vaccines- Shingrix (1 of 2) Never done   COVID-19 Vaccine (4 - 2023-24 season) 12/14/2021   FOOT EXAM  04/19/2022    Colorectal cancer screening: No longer required.   Lung Cancer Screening: (Low Dose CT Chest recommended if Age 86-80 years, 30 pack-year currently smoking OR have quit w/in 15years.) does not  qualify.   Lung Cancer Screening Referral: n/a  Additional Screening:  Hepatitis C Screening: does not qualify;   Vision Screening: Recommended annual ophthalmology exams for early detection of glaucoma and other disorders of the eye. Is the patient up to date with their annual eye exam?  Yes  Who is the provider or what is the name of the office in which the patient attends annual eye exams? Dr.Groat  If pt is not established with a provider, would they like to be referred to a provider to establish care? No .   Dental Screening: Recommended annual dental exams for proper oral hygiene  Community Resource Referral / Chronic Care Management: CRR required this visit?  No   CCM required this visit?  No      Plan:     I have personally reviewed and noted the following in the patient's chart:   Medical and social history Use of alcohol, tobacco or illicit drugs  Current medications and supplements including opioid prescriptions. Patient is not currently taking opioid prescriptions. Functional ability and status Nutritional status Physical activity Advanced directives List of other physicians Hospitalizations, surgeries, and ER visits in previous 12 months Vitals Screenings to include cognitive, depression, and falls Referrals and appointments  In addition, I have reviewed and discussed with patient certain preventive protocols, quality metrics, and best practice recommendations. A written personalized care plan for preventive services as well as general preventive health recommendations were provided to patient.     Lorrene Reid, LPN   [id number]/61/[id number]   Nurse Notes: patient wife Matthew Campos help complete MWV

## 2022-08-13 DIAGNOSIS — R0902 Hypoxemia: Secondary | ICD-10-CM | POA: Diagnosis not present

## 2022-08-13 DIAGNOSIS — R739 Hyperglycemia, unspecified: Secondary | ICD-10-CM | POA: Diagnosis not present

## 2022-08-13 DIAGNOSIS — Z87891 Personal history of nicotine dependence: Secondary | ICD-10-CM | POA: Diagnosis not present

## 2022-08-13 DIAGNOSIS — R0602 Shortness of breath: Secondary | ICD-10-CM | POA: Diagnosis not present

## 2022-08-13 DIAGNOSIS — I959 Hypotension, unspecified: Secondary | ICD-10-CM | POA: Diagnosis not present

## 2022-08-13 DIAGNOSIS — K219 Gastro-esophageal reflux disease without esophagitis: Secondary | ICD-10-CM | POA: Diagnosis not present

## 2022-08-13 DIAGNOSIS — E785 Hyperlipidemia, unspecified: Secondary | ICD-10-CM | POA: Diagnosis not present

## 2022-08-13 DIAGNOSIS — I10 Essential (primary) hypertension: Secondary | ICD-10-CM | POA: Diagnosis not present

## 2022-08-13 DIAGNOSIS — R0600 Dyspnea, unspecified: Secondary | ICD-10-CM | POA: Diagnosis not present

## 2022-08-13 DIAGNOSIS — F32A Depression, unspecified: Secondary | ICD-10-CM | POA: Diagnosis not present

## 2022-08-13 DIAGNOSIS — Z885 Allergy status to narcotic agent status: Secondary | ICD-10-CM | POA: Diagnosis not present

## 2022-08-20 ENCOUNTER — Encounter (HOSPITAL_COMMUNITY): Payer: Self-pay | Admitting: Emergency Medicine

## 2022-08-20 ENCOUNTER — Other Ambulatory Visit: Payer: Self-pay

## 2022-08-20 ENCOUNTER — Emergency Department (HOSPITAL_COMMUNITY)
Admission: EM | Admit: 2022-08-20 | Discharge: 2022-08-23 | Disposition: A | Discharge status: Home / Self Care | Attending: Emergency Medicine | Admitting: Emergency Medicine

## 2022-08-20 DIAGNOSIS — R739 Hyperglycemia, unspecified: Secondary | ICD-10-CM | POA: Diagnosis not present

## 2022-08-20 DIAGNOSIS — Z79899 Other long term (current) drug therapy: Secondary | ICD-10-CM | POA: Insufficient documentation

## 2022-08-20 DIAGNOSIS — Z91040 Latex allergy status: Secondary | ICD-10-CM | POA: Insufficient documentation

## 2022-08-20 DIAGNOSIS — I2510 Atherosclerotic heart disease of native coronary artery without angina pectoris: Secondary | ICD-10-CM | POA: Insufficient documentation

## 2022-08-20 DIAGNOSIS — Z7901 Long term (current) use of anticoagulants: Secondary | ICD-10-CM | POA: Diagnosis not present

## 2022-08-20 DIAGNOSIS — E1122 Type 2 diabetes mellitus with diabetic chronic kidney disease: Secondary | ICD-10-CM | POA: Diagnosis not present

## 2022-08-20 DIAGNOSIS — J449 Chronic obstructive pulmonary disease, unspecified: Secondary | ICD-10-CM | POA: Insufficient documentation

## 2022-08-20 DIAGNOSIS — I130 Hypertensive heart and chronic kidney disease with heart failure and stage 1 through stage 4 chronic kidney disease, or unspecified chronic kidney disease: Secondary | ICD-10-CM | POA: Insufficient documentation

## 2022-08-20 DIAGNOSIS — Z794 Long term (current) use of insulin: Secondary | ICD-10-CM | POA: Diagnosis not present

## 2022-08-20 DIAGNOSIS — E1165 Type 2 diabetes mellitus with hyperglycemia: Secondary | ICD-10-CM | POA: Diagnosis not present

## 2022-08-20 DIAGNOSIS — I509 Heart failure, unspecified: Secondary | ICD-10-CM | POA: Diagnosis not present

## 2022-08-20 DIAGNOSIS — Z7984 Long term (current) use of oral hypoglycemic drugs: Secondary | ICD-10-CM | POA: Diagnosis not present

## 2022-08-20 DIAGNOSIS — I959 Hypotension, unspecified: Secondary | ICD-10-CM | POA: Diagnosis not present

## 2022-08-20 DIAGNOSIS — N183 Chronic kidney disease, stage 3 unspecified: Secondary | ICD-10-CM | POA: Diagnosis not present

## 2022-08-20 DIAGNOSIS — R0902 Hypoxemia: Secondary | ICD-10-CM | POA: Diagnosis not present

## 2022-08-20 LAB — CBC
HCT: 40.9 % (ref 39.0–52.0)
Hemoglobin: 13 g/dL (ref 13.0–17.0)
MCH: 31.8 pg (ref 26.0–34.0)
MCHC: 31.8 g/dL (ref 30.0–36.0)
MCV: 100 fL (ref 80.0–100.0)
Platelets: 135 10*3/uL — ABNORMAL LOW (ref 150–400)
RBC: 4.09 MIL/uL — ABNORMAL LOW (ref 4.22–5.81)
RDW: 16 % — ABNORMAL HIGH (ref 11.5–15.5)
WBC: 7.7 10*3/uL (ref 4.0–10.5)
nRBC: 0 % (ref 0.0–0.2)

## 2022-08-20 LAB — URINALYSIS, ROUTINE W REFLEX MICROSCOPIC
Bacteria, UA: NONE SEEN
Bilirubin Urine: NEGATIVE
Glucose, UA: >=500 mg/dL — AB
Hgb urine dipstick: NEGATIVE
Ketones, ur: NEGATIVE mg/dL
Leukocytes,Ua: NEGATIVE
Nitrite: NEGATIVE
Protein, ur: NEGATIVE mg/dL
Specific Gravity, Urine: 1.013 (ref 1.005–1.030)
pH: 6 (ref 5.0–8.0)

## 2022-08-20 LAB — BASIC METABOLIC PANEL
Anion gap: 9 (ref 5–15)
BUN: 37 mg/dL — ABNORMAL HIGH (ref 8–23)
CO2: 29 mmol/L (ref 22–32)
Calcium: 8.4 mg/dL — ABNORMAL LOW (ref 8.9–10.3)
Chloride: 96 mmol/L — ABNORMAL LOW (ref 98–111)
Creatinine, Ser: 1.56 mg/dL — ABNORMAL HIGH (ref 0.61–1.24)
GFR, Estimated: 44 mL/min — ABNORMAL LOW (ref >60)
Glucose, Bld: 176 mg/dL — ABNORMAL HIGH (ref 70–99)
Potassium: 4 mmol/L (ref 3.5–5.1)
Sodium: 134 mmol/L — ABNORMAL LOW (ref 135–145)

## 2022-08-20 LAB — CBG MONITORING, ED
Glucose-Capillary: 164 mg/dL — ABNORMAL HIGH (ref 70–99)
Glucose-Capillary: 230 mg/dL — ABNORMAL HIGH (ref 70–99)

## 2022-08-20 MED ORDER — PANTOPRAZOLE SODIUM 40 MG PO TBEC
40.0000 mg | DELAYED_RELEASE_TABLET | Freq: Two times a day (BID) | ORAL | Status: DC
  Administered 2022-08-20 – 2022-08-23 (×5): 40 mg via ORAL
  Filled 2022-08-20 (×6): qty 1

## 2022-08-20 MED ORDER — TORSEMIDE 20 MG PO TABS
20.0000 mg | ORAL_TABLET | Freq: Two times a day (BID) | ORAL | Status: DC
  Administered 2022-08-21 – 2022-08-23 (×5): 20 mg via ORAL
  Filled 2022-08-20 (×5): qty 1

## 2022-08-20 MED ORDER — MOMETASONE FURO-FORMOTEROL FUM 100-5 MCG/ACT IN AERO
2.0000 | INHALATION_SPRAY | Freq: Two times a day (BID) | RESPIRATORY_TRACT | Status: DC
  Administered 2022-08-21 – 2022-08-23 (×5): 2 via RESPIRATORY_TRACT
  Filled 2022-08-20: qty 8.8

## 2022-08-20 MED ORDER — FERROUS SULFATE 325 (65 FE) MG PO TABS
325.0000 mg | ORAL_TABLET | ORAL | Status: DC
  Administered 2022-08-21 – 2022-08-23 (×2): 325 mg via ORAL
  Filled 2022-08-20 (×2): qty 1

## 2022-08-20 MED ORDER — DONEPEZIL HCL 5 MG PO TABS
10.0000 mg | ORAL_TABLET | Freq: Every day | ORAL | Status: DC
  Administered 2022-08-21 – 2022-08-23 (×3): 10 mg via ORAL
  Filled 2022-08-20 (×3): qty 2

## 2022-08-20 MED ORDER — METOPROLOL SUCCINATE ER 25 MG PO TB24
12.5000 mg | ORAL_TABLET | Freq: Every day | ORAL | Status: DC
  Administered 2022-08-21 – 2022-08-23 (×2): 12.5 mg via ORAL
  Filled 2022-08-20 (×3): qty 1

## 2022-08-20 MED ORDER — PROCHLORPERAZINE MALEATE 10 MG PO TABS: 10.0000 mg | ORAL_TABLET | ORAL | Status: DC | PRN

## 2022-08-20 MED ORDER — ALBUTEROL SULFATE HFA 108 (90 BASE) MCG/ACT IN AERS: 2.0000 | INHALATION_SPRAY | Freq: Four times a day (QID) | RESPIRATORY_TRACT | Status: DC | PRN

## 2022-08-20 MED ORDER — LOPERAMIDE HCL 2 MG PO CAPS: 4.0000 mg | ORAL_CAPSULE | ORAL | Status: DC | PRN

## 2022-08-20 MED ORDER — LEVOTHYROXINE SODIUM 50 MCG PO TABS
100.0000 ug | ORAL_TABLET | Freq: Every day | ORAL | Status: DC
  Administered 2022-08-21 – 2022-08-23 (×3): 100 ug via ORAL
  Filled 2022-08-20 (×3): qty 2

## 2022-08-20 MED ORDER — LOSARTAN POTASSIUM 25 MG PO TABS
12.5000 mg | ORAL_TABLET | Freq: Every day | ORAL | Status: DC
  Administered 2022-08-21 – 2022-08-23 (×2): 12.5 mg via ORAL
  Filled 2022-08-20 (×3): qty 1

## 2022-08-20 MED ORDER — DAPAGLIFLOZIN PROPANEDIOL 10 MG PO TABS
10.0000 mg | ORAL_TABLET | Freq: Every day | ORAL | Status: DC
  Administered 2022-08-21 – 2022-08-22 (×2): 10 mg via ORAL
  Filled 2022-08-20 (×7): qty 1

## 2022-08-20 MED ORDER — SPIRONOLACTONE 12.5 MG HALF TABLET
12.5000 mg | ORAL_TABLET | Freq: Every day | ORAL | Status: DC
  Administered 2022-08-21 – 2022-08-23 (×2): 12.5 mg via ORAL
  Filled 2022-08-20 (×3): qty 1

## 2022-08-20 MED ORDER — ASPIRIN 81 MG PO TBEC
81.0000 mg | DELAYED_RELEASE_TABLET | Freq: Every day | ORAL | Status: DC
  Administered 2022-08-21 – 2022-08-23 (×3): 81 mg via ORAL
  Filled 2022-08-20 (×3): qty 1

## 2022-08-20 MED ORDER — RISPERIDONE 1 MG PO TABS
1.0000 mg | ORAL_TABLET | Freq: Every day | ORAL | Status: DC
  Administered 2022-08-20 – 2022-08-22 (×2): 1 mg via ORAL
  Filled 2022-08-20 (×3): qty 1

## 2022-08-20 MED ORDER — RISPERIDONE 1 MG PO TABS
0.5000 mg | ORAL_TABLET | Freq: Every day | ORAL | Status: DC
  Administered 2022-08-21 – 2022-08-23 (×3): 0.5 mg via ORAL
  Filled 2022-08-20 (×2): qty 2
  Filled 2022-08-20 (×2): qty 1

## 2022-08-20 MED ORDER — ADULT MULTIVITAMIN W/MINERALS CH
1.0000 | ORAL_TABLET | Freq: Every day | ORAL | Status: DC
  Administered 2022-08-21 – 2022-08-23 (×3): 1 via ORAL
  Filled 2022-08-20 (×3): qty 1

## 2022-08-20 MED ORDER — ALBUTEROL SULFATE (2.5 MG/3ML) 0.083% IN NEBU: 2.5000 mg | INHALATION_SOLUTION | RESPIRATORY_TRACT | Status: DC | PRN

## 2022-08-20 MED ORDER — AZELASTINE HCL 0.1 % NA SOLN
1.0000 | Freq: Two times a day (BID) | NASAL | Status: DC
  Administered 2022-08-22: 1 via NASAL
  Filled 2022-08-20: qty 30

## 2022-08-20 MED ORDER — GLIPIZIDE 5 MG PO TABS
5.0000 mg | ORAL_TABLET | Freq: Two times a day (BID) | ORAL | Status: DC
  Administered 2022-08-21 – 2022-08-23 (×5): 5 mg via ORAL
  Filled 2022-08-20 (×5): qty 1

## 2022-08-20 MED ORDER — SODIUM CHLORIDE 0.9 % IV BOLUS
500.0000 mL | Freq: Once | INTRAVENOUS | Status: AC
  Administered 2022-08-20: 500 mL via INTRAVENOUS

## 2022-08-20 MED ORDER — QUETIAPINE FUMARATE 25 MG PO TABS
25.0000 mg | ORAL_TABLET | Freq: Every day | ORAL | Status: DC
  Administered 2022-08-20 – 2022-08-22 (×2): 25 mg via ORAL
  Filled 2022-08-20 (×3): qty 1

## 2022-08-20 MED ORDER — APIXABAN 2.5 MG PO TABS
2.5000 mg | ORAL_TABLET | Freq: Two times a day (BID) | ORAL | Status: DC
  Administered 2022-08-20 – 2022-08-23 (×5): 2.5 mg via ORAL
  Filled 2022-08-20 (×6): qty 1

## 2022-08-20 MED ORDER — ROSUVASTATIN CALCIUM 10 MG PO TABS
10.0000 mg | ORAL_TABLET | Freq: Every day | ORAL | Status: DC
  Administered 2022-08-21 – 2022-08-23 (×3): 10 mg via ORAL
  Filled 2022-08-20 (×3): qty 1

## 2022-08-20 NOTE — ED Notes (Signed)
Pt on 3L [place] at baseline

## 2022-08-20 NOTE — ED Triage Notes (Signed)
Pt is from home and hospice sent him due to blood sugar in the 350s. Ems blood sugar was 253.

## 2022-08-20 NOTE — ED Notes (Addendum)
Pt continuously using call bell despite not needing anything and staff just leaving the room

## 2022-08-20 NOTE — ED Provider Notes (Signed)
[place] EMERGENCY DEPARTMENT AT [facility] Provider Note   CSN: [id number] Arrival date & time: 08/20/22  1434     History  Chief Complaint  Patient presents with   Hyperglycemia    Matthew Campos is a 82 y.o. male.   Hyperglycemia   82 year old male presents emergency department with complaints of elevated blood glucose.  Patient states he has been adherent with his diabetes medications with no recent change in diet.  States that his blood glucose was taken earlier at living facility and was around 350 prompting visit to the emergency department.  Patient states that he feels at baseline.  No chest pain, shortness of breath, abdominal pain, nausea, vomiting, urinary symptoms, change in bowel habits.  Patient on 2 to 3 L chronically of oxygen nasal cannula.  Past medical history significant for COPD, CHF, coronary artery disease, diabetes mellitus on insulin, hypertension, MI, paroxysmal atrial fibrillation, TAVR, severe aortic stenosis, OSA, CKD stage III, hyperlipidemia, abdominal aortic aneurysm, Alzheimer's dementia, GERD, pancytopenia  Home Medications Prior to Admission medications   Medication Sig Start Date End Date Taking? Authorizing Provider  albuterol (PROVENTIL) (2.5 MG/3ML) 0.083% nebulizer solution Take 3 mLs (2.5 mg total) by nebulization every 4 (four) hours as needed for wheezing or shortness of breath. 04/25/22   Shon Hale, MD  albuterol (VENTOLIN HFA) 108 (90 Base) MCG/ACT inhaler Inhale 2 puffs into the lungs every 6 (six) hours as needed for wheezing or shortness of breath. 04/25/22   Shon Hale, MD  apixaban (ELIQUIS) 2.5 MG TABS tablet Take 1 tablet (2.5 mg total) by mouth 2 (two) times daily. 04/25/22   Shon Hale, MD  azelastine (ASTELIN) 0.1 % nasal spray USE 1 SPRAY IN EACH NOSTRIL TWICE DAILY 03/19/22   Delynn Flavin M, DO  Blood Glucose Monitoring Suppl (BLOOD GLUCOSE MONITOR SYSTEM) w/Device KIT 1 Device by Does not apply  route 2 (two) times daily. Dx E11.9 05/08/21   Delynn Flavin M, DO  calcium carbonate (OSCAL) 1500 (600 Ca) MG TABS tablet Take by mouth 2 (two) times daily with a meal.    [provider]  clotrimazole-betamethasone (LOTRISONE) cream APPLY TWICE DAILY TO TORSO FOR UP TO 14 DAYS Patient taking differently: Apply 1 Application topically 2 (two) times daily. 04/19/22   Raliegh Ip, DO  donepezil (ARICEPT) 5 MG tablet Take 2 tablets (10 mg total) by mouth daily. 04/25/22   Shon Hale, MD  FARXIGA 10 MG TABS tablet TAKE ONE TABLET EACH MORNING BEFORE BREAKFAST 07/08/22   Delynn Flavin M, DO  ferrous sulfate 325 (65 FE) MG EC tablet Take 1 tablet (325 mg total) by mouth every Monday, Wednesday, and Friday. 04/26/22   Shon Hale, MD  glipiZIDE (GLUCOTROL) 5 MG tablet Take 1 tablet (5 mg total) by mouth 2 (two) times daily before a meal. 04/25/22   Emokpae, Courage, MD  glucose blood (ONETOUCH VERIO) test strip Check BS BID Dx E11.9 11/29/21   Delynn Flavin M, DO  HYDROcodone-acetaminophen (NORCO) 5-325 MG tablet Take 1-2 tablets by mouth every 6 (six) hours as needed. 07/03/22   Geoffery Lyons, MD  Lancet Device MISC Use to check BS BID Dx E11.9 05/08/21   Delynn Flavin M, DO  Lancets [facility] DELICA PLUS [place]) MISC Check BS BID Dx E11.9 11/29/21   Raliegh Ip, DO  levothyroxine (SYNTHROID) 100 MCG tablet TAKE ONE (1) TABLET EACH DAY Patient taking differently: Take 100 mcg by mouth daily before breakfast. 04/18/22   Delynn Flavin  M, DO  lidocaine (LIDODERM) 5 % APPLY 1 PATCH AND LEAVE ON FOR 12 HOURS THEN OFF FOR 12 HOURS 06/18/21   Gottschalk, Kathie Rhodes M, DO  loperamide (IMODIUM) 2 MG capsule TAKE 2 CAPSULES BY MOUTH AS NEEDED FOR DIARRHEA/LOOSE STOOL. FOLLOWED BY 1 CAPSULE AFTER EACH LOOSE STOOL (MAX OF 8 CAPSULES DAILY) 07/08/22   Delynn Flavin M, DO  losartan (COZAAR) 25 MG tablet Take 0.5 tablets (12.5 mg total) by mouth daily. 04/25/22   Shon Hale, MD  metoprolol succinate (TOPROL-XL) 25 MG 24 hr tablet Take 0.5 tablets (12.5 mg total) by mouth daily. 04/25/22   Shon Hale, MD  Multiple Vitamin (MULTIVITAMIN WITH MINERALS) TABS Take 1 tablet by mouth daily.    [provider]  nitroGLYCERIN (NITROSTAT) 0.4 MG SL tablet DISSOLVE 1 TAB UNDER TOUNGE FOR CHEST PAIN. MAY REPEAT EVERY 5 MINUTES FOR 3 DOSES. IF NO RELIEF CALL 911 OR GO TO ER 05/16/22   Delynn Flavin M, DO  ondansetron (ZOFRAN-ODT) 4 MG disintegrating tablet TAKE ONE TABLET BY MOUTH EVERY EIGHT HOURS AS NEEDED 07/08/22   Delynn Flavin M, DO  pantoprazole (PROTONIX) 40 MG tablet Take 1 tablet (40 mg total) by mouth daily. 04/25/22   Shon Hale, MD  risperiDONE (RISPERDAL) 0.5 MG tablet Take 1 tablet (0.5 mg total) by mouth daily. 04/26/22   Shon Hale, MD  risperiDONE (RISPERDAL) 1 MG tablet Take 1 tablet (1 mg total) by mouth at bedtime. 04/25/22   Shon Hale, MD  rosuvastatin (CRESTOR) 10 MG tablet TAKE ONE (1) TABLET EACH DAY Patient taking differently: Take 10 mg by mouth daily. 02/20/22   Raliegh Ip, DO  RYBELSUS 7 MG TABS TAKE ONE CAPSULE BY MOUTH DAILY 07/08/22   Delynn Flavin M, DO  spironolactone (ALDACTONE) 25 MG tablet Take 0.5 tablets (12.5 mg total) by mouth daily. 04/25/22 04/25/23  Shon Hale, MD  SYMBICORT 80-4.5 MCG/ACT inhaler Inhale 2 puffs into the lungs 2 (two) times daily. 04/25/22   Shon Hale, MD  torsemide (DEMADEX) 20 MG tablet Take 2 tablets (40 mg total) by mouth daily. 04/25/22   Shon Hale, MD      Allergies    Codeine, Latex, Morphine, and Lisinopril    Review of Systems   Review of Systems  All other systems reviewed and are negative.   Physical Exam Updated Vital Signs BP (!) 108/58   Pulse 69   Temp 97.7 F (36.5 C) (Oral)   Resp 19   Ht 5\' 8"  (1.727 m)   Wt 90.7 kg   SpO2 98%   BMI 30.40 kg/m  Physical Exam Vitals and nursing note reviewed.  Constitutional:       General: He is not in acute distress.    Appearance: He is well-developed.  HENT:     Head: Normocephalic and atraumatic.  Eyes:     Conjunctiva/sclera: Conjunctivae normal.  Cardiovascular:     Rate and Rhythm: Normal rate and regular rhythm.  Pulmonary:     Effort: Pulmonary effort is normal. No respiratory distress.     Breath sounds: Normal breath sounds.  Abdominal:     Palpations: Abdomen is soft.     Tenderness: There is no abdominal tenderness.  Musculoskeletal:        General: No swelling.     Cervical back: Neck supple.  Skin:    General: Skin is warm and dry.     Capillary Refill: Capillary refill takes less than 2 seconds.  Neurological:  Mental Status: He is alert.  Psychiatric:        Mood and Affect: Mood normal.     ED Results / Procedures / Treatments   Labs (all labs ordered are listed, but only abnormal results are displayed) Labs Reviewed  BASIC METABOLIC PANEL - Abnormal; Notable for the following components:      Result Value   Sodium 134 (*)    Chloride 96 (*)    Glucose, Bld 176 (*)    BUN 37 (*)    Creatinine, Ser 1.56 (*)    Calcium 8.4 (*)    GFR, Estimated 44 (*)    All other components within normal limits  URINALYSIS, ROUTINE W REFLEX MICROSCOPIC - Abnormal; Notable for the following components:   Glucose, UA >=500 (*)    All other components within normal limits  CBC - Abnormal; Notable for the following components:   RBC 4.09 (*)    RDW 16.0 (*)    Platelets 135 (*)    All other components within normal limits  CBG MONITORING, ED - Abnormal; Notable for the following components:   Glucose-Capillary 164 (*)    All other components within normal limits    EKG None  Radiology No results found.  Procedures Procedures    Medications Ordered in ED Medications  sodium chloride 0.9 % bolus 500 mL (500 mLs Intravenous Bolus 08/20/22 1522)    ED Course/ Medical Decision Making/ A&P                             Medical  Decision Making Amount and/or Complexity of Data Reviewed Labs: ordered.   This patient presents to the ED for concern of elevated blood sugar, this involves an extensive number of treatment options, and is a complaint that carries with it a high risk of complications and morbidity.  The differential diagnosis includes hyperglycemia, HHS, DKA   Co morbidities that complicate the patient evaluation  See HPI   Additional history obtained:  Additional history obtained from EMR External records from outside source obtained and reviewed including hospital records   Lab Tests:  I Ordered, and personally interpreted labs.  The pertinent results include: No leukocytosis.  No evidence of anemia.  Thrombocytopenia of 135 which is near patient's baseline.  Patient with baseline renal dysfunction with creatinine 1.56, GFR 44 and BUN of 37.  Mild hyponatremia and hyperchloremia of 1 3496 respectively of which treated with intravenous fluids.  UA greater than 500 glucose but otherwise unremarkable.   Imaging Studies ordered:  N/a   Cardiac Monitoring: / EKG:  N/a   Consultations Obtained:  N/a   Problem List / ED Course / Critical interventions / Medication management  Hyperglycemia I ordered medication including 500 cc normal saline   Reevaluation of the patient after these medicines showed that the patient improved I have reviewed the patients home medicines and have made adjustments as needed   Social Determinants of Health:  Former use.  Denies illicit drug use.   Test / Admission - Considered:  Hyperglycemia Vitals signs within normal range and stable throughout visit. Laboratory studies significant for: See above 82 year old male presents emergency room with complaints of abnormal lab.  Patient at baseline with no acute complaints.  Patient's blood glucose elevated 164 on the emergency department with no indication for a hhs/DKA.  Will recommend continuation of  patient's at home antihyperglycemic agents and follow-up with primary care.  Treatment plan  discussed at length with patient and he acknowledged understanding was agreeable to said plan. Worrisome signs and symptoms were discussed with the patient, and the patient acknowledged understanding to return to the ED if noticed. Patient was stable upon discharge.          Final Clinical Impression(s) / ED Diagnoses Final diagnoses:  Hyperglycemia    Rx / DC Orders ED Discharge Orders     None         Peter Garter, [place] 08/20/22 1559    Gerhard Munch, MD 08/20/22 [phone]

## 2022-08-20 NOTE — ED Notes (Addendum)
Pt given a snack and something to drink as pt is allowed to eat and drink per PA

## 2022-08-20 NOTE — ED Notes (Signed)
This RN called pts wife to inform her pt was up for discharge as pt requested I call his wife. Pts wife states she cannot get him back home as he can barely walk at all anymore and pt's wife states "I am 118 pounds, he is a big man, I cannot get him up, he barely makes it to the bathroom anymore, he cannot walk, hospice comes in the mornings" PA-C made aware

## 2022-08-20 NOTE — ED Notes (Signed)
Pt changed into a new brief, peri care performed, pt is sitting in chair

## 2022-08-20 NOTE — Discharge Instructions (Addendum)
As the visit emergency department today was overall reassuring.  Your blood glucose was 164.  No evidence of complications from mildly elevated blood glucose.  Continue taking medications as prescribed.  Recommend follow-up with primary care for reassessment of your blood sugar.  Please not hesitate to return to emergency department for worrisome signs and symptoms we discussed become apparent.  We also evaluated you for skilled nursing facility placement.  Your insurance did not approve this.  If your symptoms worsen or you develop any new symptoms such as fevers or chills, confusion, lightheadedness or dizziness, syncope, chest pain, abdominal pain, or any other new symptoms please return to the emergency department.

## 2022-08-20 NOTE — ED Notes (Addendum)
Pt is continuously on the call light every 3 minutes, does not seem to recall previously conversation informing him of the plan, pt thought it used the bathroom, However, once checked pt, he did not use the bathroom, bedside toilet and brief on pt

## 2022-08-21 LAB — CBG MONITORING, ED
Glucose-Capillary: 144 mg/dL — ABNORMAL HIGH (ref 70–99)
Glucose-Capillary: 191 mg/dL — ABNORMAL HIGH (ref 70–99)

## 2022-08-21 NOTE — ED Notes (Signed)
Pt asleep. V/s deferred  

## 2022-08-21 NOTE — TOC Initial Note (Signed)
Transition of Care [facility]) - Initial/Assessment Note    Patient Details  Name: Matthew Campos MRN: [id number] Date of Birth: Mar 25, 1941  [facility]) CM/SW Contact:    Karn Cassis, LCSW Phone Number: 08/21/2022, 11:02 AM  Clinical Narrative: Pt in ED and wife unable to take pt home as she can't manage him. PT evaluated pt and recommend SNF. Discussed with wife who is agreeable and requests [facility] as it is close to their home. Pt is active with [facility] LCSW notified Keka at [facility] that pt will have to revoke hospice for insurance to [facility]. Keka to follow up with wife. Pt's wife is also aware. Per [name], insurance should be updated tomorrow with revocation so SNF auth can be pursued. TOC will send SNF referral to [facility].                   Expected Discharge Plan: Skilled Nursing Facility Barriers to Discharge: ED SNF auth   Patient Goals and CMS Choice Patient states their goals for this hospitalization and ongoing recovery are:: SNF   Choice offered to / list presented to : Spouse [employer] ownership interest in [facility].provided to::  (wife wants [facility])    Expected Discharge Plan and Services In-house Referral: Clinical Social Work   Post Acute Care Choice: Skilled Nursing Facility Living arrangements for the past 2 months: Single Family Home                                      Prior Living Arrangements/Services Living arrangements for the past 2 months: Single Family Home Lives with:: Spouse Patient language and need for interpreter reviewed:: Yes        Need for Family Participation in Patient Care: Yes (Comment)     Criminal Activity/Legal Involvement Pertinent to Current Situation/Hospitalization: No - Comment as needed  Activities of Daily Living      Permission Sought/Granted                  Emotional Assessment         Alcohol / Substance Use: Not Applicable Psych  Involvement: No (comment)  Admission diagnosis:  Hyperglycemia Patient Active Problem List   Diagnosis Date Noted   Borderline high cholesterol 06/14/2022   Acute on chronic systolic CHF (congestive heart failure) (HCC) 04/21/2022   COVID-19 virus infection 04/21/2022   Acute on chronic respiratory failure with hypoxia (HCC) 04/21/2022   Impaired mobility and ADLs 02/06/2022   Low back pain 06/20/2021   Alzheimer's dementia without behavioral disturbance (HCC) 04/19/2021   Acquired hypothyroidism 04/19/2021   Diarrhea 03/26/2021   Hyperlipidemia associated with type 2 diabetes mellitus (HCC) 02/04/2020   CAP (community acquired pneumonia) 09/07/2019   Hypomagnesemia 09/07/2019   CKD (chronic kidney disease), stage III (HCC) 09/07/2019   Volume overload 09/07/2019   Community acquired pneumonia 09/07/2019   GI bleeding 04/14/2019   Chronic diastolic heart failure (HCC)    Bradycardia    Renal insufficiency    Chest pain 04/13/2019   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/13/2019   Cholecystostomy care (HCC) 04/13/2019   Coronary artery disease    Chronic ischemic heart disease    Paroxysmal atrial fibrillation (HCC)    Coagulopathy (HCC)    Chronic anticoagulation    Dysphagia    Elevated alkaline phosphatase measurement    Elevated  bilirubin    Atrial fibrillation (HCC) 03/16/2019   Pancytopenia (HCC) 02/25/2019   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   Sinus bradycardia 02/18/2019   Cholecystitis 02/16/2019   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   Normocytic anemia 12/23/2018   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HCC) 12/23/2018   COPD (chronic obstructive pulmonary disease) (HCC) 12/23/2018   DM (diabetes mellitus) (HCC) 12/23/2018   GERD (gastroesophageal reflux disease) 12/23/2018   OSA (obstructive sleep apnea) 12/23/2018   Anemia 12/23/2018   Acute heart failure with preserved ejection fraction (HCC)  12/23/2018   Anxiety and depression 12/23/2018   Obesity 12/23/2018   Severe aortic stenosis 11/26/2018   Anxiety 10/28/2018   Congestive heart failure (HCC) 10/28/2018   At risk for falls 10/28/2018   Oxygen dependent 10/28/2018   Major depressive disorder, single episode, severe without psychotic features (HCC) 07/21/2017   Thrombocytopenia (HCC) 07/21/2017   CAD (coronary artery disease) 12/27/2015   Gastroesophageal reflux disease 12/27/2015   HTN (hypertension) 12/27/2015   Abdominal aortic aneurysm (AAA) without rupture (HCC) 10/19/2015   Hyperlipidemia 09/09/2013   Type 2 diabetes mellitus (HCC) 09/09/2013   PCP:  Raliegh Ip, DO Pharmacy:   [facility] - [place], [place] - [address] [place] [address] Phone: [phone] Fax: [phone]     Social Determinants of Health (SDOH) Social History: SDOH Screenings   Food Insecurity: No Food Insecurity (07/31/2022)  Housing: Low Risk  (07/31/2022)  Transportation Needs: No Transportation Needs (05/09/2022)  Utilities: Not At Risk (07/31/2022)  Alcohol Screen: Low Risk  (07/31/2022)  Depression (PHQ2-9): Low Risk  (07/31/2022)  Financial Resource Strain: Low Risk  (07/31/2022)  Physical Activity: Inactive (07/31/2022)  Social Connections: Moderately Isolated (07/31/2022)  Stress: No Stress Concern Present (07/31/2022)  Tobacco Use: Medium Risk (08/20/2022)   SDOH Interventions:     Readmission Risk Interventions     No data to display

## 2022-08-21 NOTE — ED Provider Notes (Signed)
Emergency Medicine Observation Re-evaluation Note  SYIRE DACKO is a 82 y.o. male, seen on rounds today.  Pt initially presented to the ED for complaints of Hyperglycemia Currently, the patient is sitting on stretcher, no complaints.  Physical Exam  BP 110/62   Pulse 66   Temp 97.7 F (36.5 C) (Oral)   Resp 20   Ht 5\' 8"  (1.727 m)   Wt 90.7 kg   SpO2 96%   BMI 30.40 kg/m  Physical Exam General: no acute distress Lungs: normal effort Psych: no agitation  ED Course / MDM  EKG:   I have reviewed the labs performed to date as well as medications administered while in observation.  Recent changes in the last 24 hours include home meds.  Plan  Current plan is for PT eval, TOC placement.    Pricilla Loveless, MD 08/21/22 [phone]

## 2022-08-21 NOTE — Evaluation (Signed)
Physical Therapy Evaluation Patient Details Name: Matthew Campos MRN: [id number] DOB: Dec 20, 1940 Today's Date: 08/21/2022  History of Present Illness  Matthew Campos is a 82 y.o. male.        Hyperglycemia      82 year old male presents emergency department with complaints of elevated blood glucose.  Patient states he has been adherent with his diabetes medications with no recent change in diet.  States that his blood glucose was taken earlier at living facility and was around 350 prompting visit to the emergency department.  Patient states that he feels at baseline.  No chest pain, shortness of breath, abdominal pain, nausea, vomiting, urinary symptoms, change in bowel habits.  Patient on 2 to 3 L chronically of oxygen nasal cannula.     Past medical history significant for COPD, CHF, coronary artery disease, diabetes mellitus on insulin, hypertension, MI, paroxysmal atrial fibrillation, TAVR, severe aortic stenosis, OSA, CKD stage III, hyperlipidemia, abdominal aortic aneurysm, Alzheimer's dementia, GERD, pancytopenia   Clinical Impression  Patient had difficulty getting into/out of bed due to increased low back pain demonstrating slow labored movement, slow labored cadence during ambulation with flexed trunk requiring Min/mod assist to avoid loss of balance and limited for gait training due to c/o fatigue/weakness.  Patient tolerated staying up in chair after therapy mostly due increased low back pain when in bed.  Patient will benefit from continued skilled physical therapy in hospital and recommended venue below to increase strength, balance, endurance for safe ADLs and gait.        Recommendations for follow up therapy are one component of a multi-disciplinary discharge planning process, led by the attending physician.  Recommendations may be updated based on patient status, additional functional criteria and insurance authorization.  Follow Up Recommendations Can patient physically be  transported by private vehicle: Yes     Assistance Recommended at Discharge Intermittent Supervision/Assistance  Patient can return home with the following  A lot of help with walking and/or transfers;A lot of help with bathing/dressing/bathroom;Assistance with cooking/housework;Help with stairs or ramp for entrance    Equipment Recommendations None recommended by PT  Recommendations for Other Services       Functional Status Assessment Patient has had a recent decline in their functional status and demonstrates the ability to make significant improvements in function in a reasonable and predictable amount of time.     Precautions / Restrictions Precautions Precautions: Fall Restrictions Weight Bearing Restrictions: No      Mobility  Bed Mobility Overal bed mobility: Needs Assistance Bed Mobility: Supine to Sit, Sit to Supine     Supine to sit: Mod assist Sit to supine: Min assist, Mod assist   General bed mobility comments: slow labored movement with c/o increased pain in low back    Transfers Overall transfer level: Needs assistance Equipment used: Rolling walker (2 wheels) Transfers: Sit to/from Stand, Bed to chair/wheelchair/BSC Sit to Stand: Min assist   Step pivot transfers: Min assist, Mod assist       General transfer comment: slow labored movement transferring to/from chair and commode in bathroom    Ambulation/Gait Ambulation/Gait assistance: Min assist, Mod assist Gait Distance (Feet): 35 Feet Assistive device: Rolling walker (2 wheels) Gait Pattern/deviations: Decreased step length - right, Decreased step length - left, Decreased stride length, Trunk flexed Gait velocity: slow     General Gait Details: slow labored unsteady cadenc with flexed trunk, limited mostly due to fatigue/weakness  Stairs  Wheelchair Mobility    Modified Rankin (Stroke Patients Only)       Balance                                              Pertinent Vitals/Pain Pain Assessment Pain Assessment: Faces Faces Pain Scale: Hurts even more Pain Location: low back Pain Descriptors / Indicators: Sharp, Sore, Grimacing, Guarding Pain Intervention(s): Limited activity within patient's tolerance, Monitored during session, Repositioned    Home Living Family/patient expects to be discharged to:: Private residence Living Arrangements: Spouse/significant other Available Help at Discharge: Family;Available PRN/intermittently Type of Home: House Home Access: Ramped entrance     Alternate Level Stairs-Number of Steps: Patient does not go upstairs Home Layout: Two level;Able to live on main level with bedroom/bathroom;Full bath on main level Home Equipment: Rolling Walker (2 wheels);Cane - quad;Cane - single point;Wheelchair - manual;Hospital bed;BSC/3in1      Prior Function Prior Level of Function : Independent/Modified Independent             Mobility Comments: household ambulator using RW ADLs Comments: assisted by family     Hand Dominance        Extremity/Trunk Assessment   Upper Extremity Assessment Upper Extremity Assessment: Generalized weakness    Lower Extremity Assessment Lower Extremity Assessment: Generalized weakness    Cervical / Trunk Assessment Cervical / Trunk Assessment: Kyphotic  Communication   Communication: No difficulties  Cognition Arousal/Alertness: Awake/alert Behavior During Therapy: WFL for tasks assessed/performed Overall Cognitive Status: Within Functional Limits for tasks assessed                                          General Comments      Exercises     Assessment/Plan    PT Assessment Patient needs continued PT services  PT Problem List Decreased strength;Decreased activity tolerance;Decreased balance;Decreased mobility       PT Treatment Interventions DME instruction;Gait training;Stair training;Functional mobility training;Therapeutic  activities;Therapeutic exercise;Balance training;Patient/family education    PT Goals (Current goals can be found in the Care Plan section)  Acute Rehab PT Goals Patient Stated Goal: return h ome after rehab PT Goal Formulation: With patient Time For Goal Achievement: 09/04/22 Potential to Achieve Goals: Good    Frequency Min 2X/week     Co-evaluation               AM-PAC PT "6 Clicks" Mobility  Outcome Measure Help needed turning from your back to your side while in a flat bed without using bedrails?: A Lot Help needed moving from lying on your back to sitting on the side of a flat bed without using bedrails?: A Lot Help needed moving to and from a bed to a chair (including a wheelchair)?: A Lot Help needed standing up from a chair using your arms (e.g., wheelchair or bedside chair)?: A Lot Help needed to walk in hospital room?: A Lot Help needed climbing 3-5 steps with a railing? : A Lot 6 Click Score: 12    End of Session Equipment Utilized During Treatment: Oxygen Activity Tolerance: Patient tolerated treatment well;Patient limited by fatigue Patient left: in chair;with call bell/phone within reach Nurse Communication: Mobility status PT Visit Diagnosis: Unsteadiness on feet (R26.81);Other abnormalities of gait and mobility (R26.89);Muscle weakness (generalized) (M62.81)  Time: 1610-9604 PT Time Calculation (min) (ACUTE ONLY): 30 min   Charges:   PT Evaluation $PT Eval Moderate Complexity: 1 Mod PT Treatments $Therapeutic Activity: 23-37 mins        2:10 PM, 08/21/22 Ocie Bob, MPT Physical Therapist with [facility] [phone] office [phone] mobile phone

## 2022-08-21 NOTE — ED Notes (Signed)
Pt sitting in recliner at this time. No needs expressed.

## 2022-08-21 NOTE — Plan of Care (Signed)
  Problem: Acute Rehab PT Goals(only PT should resolve) Goal: Pt Will Go Supine/Side To Sit Outcome: Progressing Flowsheets (Taken 08/21/2022 1412) Pt will go Supine/Side to Sit: with minimal assist Goal: Patient Will Transfer Sit To/From Stand Outcome: Progressing Flowsheets (Taken 08/21/2022 1412) Patient will transfer sit to/from stand: with minimal assist Goal: Pt Will Transfer Bed To Chair/Chair To Bed Outcome: Progressing Flowsheets (Taken 08/21/2022 1412) Pt will Transfer Bed to Chair/Chair to Bed: with min assist Goal: Pt Will Ambulate Outcome: Progressing Flowsheets (Taken 08/21/2022 1412) Pt will Ambulate:  50 feet  with minimal assist  with min guard assist  with rolling walker   2:12 PM, 08/21/22 Ocie Bob, MPT Physical Therapist with [facility] [phone] office [phone] mobile phone

## 2022-08-21 NOTE — [place] FL2 (Signed)
[place] MEDICAID FL2 LEVEL OF CARE FORM     IDENTIFICATION  Patient Name: Matthew Campos Birthdate: 14-Jul-1940 Sex: male Admission Date (Current Location): 08/20/2022  [facility] and [place] Number:  [employer] and Address:  [facility],  [address] [place] [address]      Provider Number: [id number]  Attending Physician Name and Address:  Default, Provider, MD  Relative Name and Phone Number:       Current Level of Care: Other (Comment) (ED) Recommended Level of Care: Skilled Nursing Facility Prior Approval Number:    Date Approved/Denied:   PASRR Number: [id number]  Discharge Plan: SNF    Current Diagnoses: Patient Active Problem List   Diagnosis Date Noted   Borderline high cholesterol 06/14/2022   Acute on chronic systolic CHF (congestive heart failure) (HCC) 04/21/2022   COVID-19 virus infection 04/21/2022   Acute on chronic respiratory failure with hypoxia (HCC) 04/21/2022   Impaired mobility and ADLs 02/06/2022   Low back pain 06/20/2021   Alzheimer's dementia without behavioral disturbance (HCC) 04/19/2021   Acquired hypothyroidism 04/19/2021   Diarrhea 03/26/2021   Hyperlipidemia associated with type 2 diabetes mellitus (HCC) 02/04/2020   CAP (community acquired pneumonia) 09/07/2019   Hypomagnesemia 09/07/2019   CKD (chronic kidney disease), stage III (HCC) 09/07/2019   Volume overload 09/07/2019   Community acquired pneumonia 09/07/2019   GI bleeding 04/14/2019   Chronic diastolic heart failure (HCC)    Bradycardia    Renal insufficiency    Chest pain 04/13/2019   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/13/2019   Cholecystostomy care (HCC) 04/13/2019   Coronary artery disease    Chronic ischemic heart disease    Paroxysmal atrial fibrillation (HCC)    Coagulopathy (HCC)    Chronic anticoagulation    Dysphagia    Elevated alkaline phosphatase measurement    Elevated bilirubin    Atrial  fibrillation (HCC) 03/16/2019   Pancytopenia (HCC) 02/25/2019   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   Sinus bradycardia 02/18/2019   Cholecystitis 02/16/2019   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   Normocytic anemia 12/23/2018   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HCC) 12/23/2018   COPD (chronic obstructive pulmonary disease) (HCC) 12/23/2018   DM (diabetes mellitus) (HCC) 12/23/2018   GERD (gastroesophageal reflux disease) 12/23/2018   OSA (obstructive sleep apnea) 12/23/2018   Anemia 12/23/2018   Acute heart failure with preserved ejection fraction (HCC) 12/23/2018   Anxiety and depression 12/23/2018   Obesity 12/23/2018   Severe aortic stenosis 11/26/2018   Anxiety 10/28/2018   Congestive heart failure (HCC) 10/28/2018   At risk for falls 10/28/2018   Oxygen dependent 10/28/2018   Major depressive disorder, single episode, severe without psychotic features (HCC) 07/21/2017   Thrombocytopenia (HCC) 07/21/2017   CAD (coronary artery disease) 12/27/2015   Gastroesophageal reflux disease 12/27/2015   HTN (hypertension) 12/27/2015   Abdominal aortic aneurysm (AAA) without rupture (HCC) 10/19/2015   Hyperlipidemia 09/09/2013   Type 2 diabetes mellitus (HCC) 09/09/2013    Orientation RESPIRATION BLADDER Height & Weight     Self, Time, Situation, Place  O2 (3L) Continent Weight: 199 lb 15.3 oz (90.7 kg) Height:  5\' 8"  (172.7 cm)  BEHAVIORAL SYMPTOMS/MOOD NEUROLOGICAL BOWEL NUTRITION STATUS      Continent Diet (Carb modified. See d/c orders for udpates.)  AMBULATORY STATUS COMMUNICATION OF NEEDS Skin   Extensive Assist Verbally Normal  Personal Care Assistance Level of Assistance  Bathing, Feeding, Dressing Bathing Assistance: Maximum assistance Feeding assistance: Limited assistance Dressing Assistance: Maximum assistance     Functional Limitations Info  Sight, Hearing, Speech Sight Info: Adequate Hearing  Info: Adequate Speech Info: Adequate    SPECIAL CARE FACTORS FREQUENCY  PT (By licensed PT)     PT Frequency: 5x weekly              Contractures      Additional Factors Info  Code Status, Allergies, Psychotropic Code Status Info: Full code Allergies Info: Codeine, Latex, Morphine, Lisinopril Psychotropic Info: Risperdal         Current Medications (08/21/2022):  This is the current hospital active medication list Current Facility-Administered Medications  Medication Dose Route Frequency Provider Last Rate Last Admin   albuterol (PROVENTIL) (2.5 MG/3ML) 0.083% nebulizer solution 2.5 mg  2.5 mg Nebulization Q4H PRN Peter Garter, PA       apixaban (ELIQUIS) tablet 2.5 mg  2.5 mg Oral BID Sherian Maroon A, PA   2.5 mg at 08/21/22 1610   aspirin EC tablet 81 mg  81 mg Oral Daily Peter Garter, [place]   81 mg at 08/21/22 0916   azelastine (ASTELIN) 0.1 % nasal spray 1 spray  1 spray Each Nare BID Sherian Maroon A, PA       dapagliflozin propanediol (FARXIGA) tablet 10 mg  10 mg Oral Daily Sherian Maroon A, PA       donepezil (ARICEPT) tablet 10 mg  10 mg Oral Daily Sherian Maroon A, [place]   10 mg at 08/21/22 9604   ferrous sulfate tablet 325 mg  325 mg Oral Q M,W,F Peter Garter, PA   325 mg at 08/21/22 0917   glipiZIDE (GLUCOTROL) tablet 5 mg  5 mg Oral BID AC Robbins, Cooper A, PA   5 mg at 08/21/22 5409   levothyroxine (SYNTHROID) tablet 100 mcg  100 mcg Oral QAC breakfast Peter Garter, PA   100 mcg at 08/21/22 8119   loperamide (IMODIUM) capsule 4 mg  4 mg Oral PRN Sherian Maroon A, PA       losartan (COZAAR) tablet 12.5 mg  12.5 mg Oral Daily Sherian Maroon A, PA   12.5 mg at 08/21/22 1478   metoprolol succinate (TOPROL-XL) 24 hr tablet 12.5 mg  12.5 mg Oral Daily Sherian Maroon A, PA   12.5 mg at 08/21/22 2956   mometasone-formoterol (DULERA) 100-5 MCG/ACT inhaler 2 puff  2 puff Inhalation BID Peter Garter, PA   2 puff at 08/21/22 2130   multivitamin  with minerals tablet 1 tablet  1 tablet Oral Daily Peter Garter, PA   1 tablet at 08/21/22 0919   pantoprazole (PROTONIX) EC tablet 40 mg  40 mg Oral BID Peter Garter, PA   40 mg at 08/21/22 0919   prochlorperazine (COMPAZINE) tablet 10 mg  10 mg Oral Q4H PRN Sherian Maroon A, PA       QUEtiapine (SEROQUEL) tablet 25 mg  25 mg Oral QHS Sherian Maroon A, [place]   25 mg at 08/20/22 2231   risperiDONE (RISPERDAL) tablet 0.5 mg  0.5 mg Oral Daily Sherian Maroon A, PA   0.5 mg at 08/21/22 8657   risperiDONE (RISPERDAL) tablet 1 mg  1 mg Oral QHS Sherian Maroon A, PA   1 mg at 08/20/22 2231   rosuvastatin (CRESTOR) tablet 10 mg  10 mg Oral Daily Sherian Maroon A, PA   10  mg at 08/21/22 0919   spironolactone (ALDACTONE) tablet 12.5 mg  12.5 mg Oral Daily Sherian Maroon A, PA   12.5 mg at 08/21/22 1610   torsemide (DEMADEX) tablet 20 mg  20 mg Oral BID Peter Garter, PA   20 mg at 08/21/22 9604   Current Outpatient Medications  Medication Sig Dispense Refill   albuterol (PROVENTIL) (2.5 MG/3ML) 0.083% nebulizer solution Take 3 mLs (2.5 mg total) by nebulization every 4 (four) hours as needed for wheezing or shortness of breath. 75 mL 12   albuterol (VENTOLIN HFA) 108 (90 Base) MCG/ACT inhaler Inhale 2 puffs into the lungs every 6 (six) hours as needed for wheezing or shortness of breath. 8.5 g 2   apixaban (ELIQUIS) 2.5 MG TABS tablet Take 1 tablet (2.5 mg total) by mouth 2 (two) times daily. 60 tablet 3   aspirin EC 81 MG tablet Take 81 mg by mouth daily. Swallow whole.     azelastine (ASTELIN) 0.1 % nasal spray USE 1 SPRAY IN EACH NOSTRIL TWICE DAILY 30 mL 5   calcium carbonate (OSCAL) 1500 (600 Ca) MG TABS tablet Take by mouth 2 (two) times daily with a meal.     donepezil (ARICEPT) 5 MG tablet Take 2 tablets (10 mg total) by mouth daily. 60 tablet 0   FARXIGA 10 MG TABS tablet TAKE ONE TABLET EACH MORNING BEFORE BREAKFAST (Patient taking differently: Take 10 mg by mouth daily.) 90  tablet 0   ferrous sulfate 325 (65 FE) MG EC tablet Take 1 tablet (325 mg total) by mouth every Monday, Wednesday, and Friday. 30 tablet 3   glipiZIDE (GLUCOTROL) 5 MG tablet Take 1 tablet (5 mg total) by mouth 2 (two) times daily before a meal. 180 tablet 4   levothyroxine (SYNTHROID) 100 MCG tablet TAKE ONE (1) TABLET EACH DAY (Patient taking differently: Take 100 mcg by mouth daily before breakfast.) 90 tablet 3   lidocaine (LIDODERM) 5 % APPLY 1 PATCH AND LEAVE ON FOR 12 HOURS THEN OFF FOR 12 HOURS (Patient taking differently: Place 1 patch onto the skin every 12 (twelve) hours as needed (for pain).) 30 patch 12   loperamide (IMODIUM) 2 MG capsule TAKE 2 CAPSULES BY MOUTH AS NEEDED FOR DIARRHEA/LOOSE STOOL. FOLLOWED BY 1 CAPSULE AFTER EACH LOOSE STOOL (MAX OF 8 CAPSULES DAILY) (Patient taking differently: Take 4 mg by mouth as needed for diarrhea or loose stools.) 30 capsule 0   losartan (COZAAR) 25 MG tablet Take 0.5 tablets (12.5 mg total) by mouth daily. 30 tablet 4   metoprolol succinate (TOPROL-XL) 25 MG 24 hr tablet Take 0.5 tablets (12.5 mg total) by mouth daily. 45 tablet 2   Multiple Vitamin (MULTIVITAMIN WITH MINERALS) TABS Take 1 tablet by mouth daily.     nitroGLYCERIN (NITROSTAT) 0.4 MG SL tablet DISSOLVE 1 TAB UNDER TOUNGE FOR CHEST PAIN. MAY REPEAT EVERY 5 MINUTES FOR 3 DOSES. IF NO RELIEF CALL 911 OR GO TO ER (Patient taking differently: Place 0.4 mg under the tongue every 5 (five) minutes as needed for chest pain.) 25 tablet 2   pantoprazole (PROTONIX) 40 MG tablet Take 1 tablet (40 mg total) by mouth daily. (Patient taking differently: Take 40 mg by mouth 2 (two) times daily.) 30 tablet 3   prochlorperazine (COMPAZINE) 10 MG tablet Take 10 mg by mouth every 4 (four) hours as needed.     risperiDONE (RISPERDAL) 0.5 MG tablet Take 1 tablet (0.5 mg total) by mouth daily. 30 tablet 2  risperiDONE (RISPERDAL) 1 MG tablet Take 1 tablet (1 mg total) by mouth at bedtime. 30 tablet 2    rosuvastatin (CRESTOR) 10 MG tablet TAKE ONE (1) TABLET EACH DAY (Patient taking differently: Take 10 mg by mouth daily.) 90 tablet 1   RYBELSUS 7 MG TABS TAKE ONE CAPSULE BY MOUTH DAILY 90 tablet 0   SEROQUEL 25 MG tablet Take 25 mg by mouth at bedtime.     sodium chloride (ALTAMIST SPRAY) 0.65 % nasal spray Place into the nose.     spironolactone (ALDACTONE) 25 MG tablet Take 0.5 tablets (12.5 mg total) by mouth daily. 15 tablet 5   SYMBICORT 80-4.5 MCG/ACT inhaler Inhale 2 puffs into the lungs 2 (two) times daily. 10.2 g 2   torsemide (DEMADEX) 20 MG tablet Take 2 tablets (40 mg total) by mouth daily. (Patient taking differently: Take 20 mg by mouth 2 (two) times daily. If swelling take additional tablet at lunch) 60 tablet 2   Blood Glucose Monitoring Suppl (BLOOD GLUCOSE MONITOR SYSTEM) w/Device KIT 1 Device by Does not apply route 2 (two) times daily. Dx E11.9 1 kit 0   clotrimazole-betamethasone (LOTRISONE) cream APPLY TWICE DAILY TO TORSO FOR UP TO 14 DAYS (Patient not taking: Reported on 08/20/2022) 45 g 0   glucose blood (ONETOUCH VERIO) test strip Check BS BID Dx E11.9 200 each 3   HYDROcodone-acetaminophen (NORCO) 5-325 MG tablet Take 1-2 tablets by mouth every 6 (six) hours as needed. (Patient not taking: Reported on 08/20/2022) 20 tablet 0   insulin glargine (LANTUS) 100 UNIT/ML injection Inject 10 Units into the skin at bedtime.     Lancet Device MISC Use to check BS BID Dx E11.9 100 each 3   Lancets (ONETOUCH DELICA PLUS LANCET33G) MISC Check BS BID Dx E11.9 200 each 3   methocarbamol (ROBAXIN) 500 MG tablet Take by mouth. (Patient not taking: Reported on 08/20/2022)     ondansetron (ZOFRAN-ODT) 4 MG disintegrating tablet TAKE ONE TABLET BY MOUTH EVERY EIGHT HOURS AS NEEDED (Patient not taking: Reported on 08/20/2022) 30 tablet 0   SENNA-S 8.6-50 MG tablet Take by mouth. (Patient not taking: Reported on 08/20/2022)       Discharge Medications: Please see discharge summary for a list of  discharge medications.  Relevant Imaging Results:  Relevant Lab Results:   Additional Information SSN: [id number]  Karn Cassis, LCSW

## 2022-08-21 NOTE — ED Notes (Signed)
Pt given crackers to eat but states that he is not going to take any of his night time medications at this time. Will try again later.

## 2022-08-22 LAB — CBG MONITORING, ED: Glucose-Capillary: 159 mg/dL — ABNORMAL HIGH (ref 70–99)

## 2022-08-22 MED ORDER — ACETAMINOPHEN 325 MG PO TABS
650.0000 mg | ORAL_TABLET | Freq: Four times a day (QID) | ORAL | Status: DC | PRN
  Filled 2022-08-22: qty 2

## 2022-08-22 NOTE — TOC Progression Note (Signed)
Transition of Care [facility]) - Progression Note    Patient Details  Name: Matthew Campos MRN: [id number] Date of Birth: 09/05/1940  [facility]) CM/SW Contact  Annice Needy, LCSW Phone Number: 08/22/2022, 4:22 PM  Clinical Narrative:    [facility] denies patient. Wife notified. Hospice was revoked on 5/8. Authorization started for SNF with [facility]. Wife agreeable to patient returning home if he does not get authorized for SNF. She stated that she has started [facility] application as well.    Expected Discharge Plan: Skilled Nursing Facility Barriers to Discharge: ED SNF auth  Expected Discharge Plan and Services In-house Referral: Clinical Social Work   Post Acute Care Choice: Skilled Nursing Facility Living arrangements for the past 2 months: Single Family Home                                       Social Determinants of Health (SDOH) Interventions SDOH Screenings   Food Insecurity: No Food Insecurity (07/31/2022)  Housing: Low Risk  (07/31/2022)  Transportation Needs: No Transportation Needs (05/09/2022)  Utilities: Not At Risk (07/31/2022)  Alcohol Screen: Low Risk  (07/31/2022)  Depression (PHQ2-9): Low Risk  (07/31/2022)  Financial Resource Strain: Low Risk  (07/31/2022)  Physical Activity: Inactive (07/31/2022)  Social Connections: Moderately Isolated (07/31/2022)  Stress: No Stress Concern Present (07/31/2022)  Tobacco Use: Medium Risk (08/20/2022)    Readmission Risk Interventions     No data to display

## 2022-08-22 NOTE — ED Notes (Signed)
Report received from [place].  Pt sitting up in chair.  No acute distress

## 2022-08-23 NOTE — ED Provider Notes (Addendum)
Emergency Medicine Observation Re-evaluation Note  RHYDER PEVEY is a 82 y.o. male, seen on rounds today.  Pt initially presented to the ED for complaints of Hyperglycemia Currently, the patient is eating breakfast. Has no questions or complaints.  Physical Exam  BP 94/67   Pulse 66   Temp 98.2 F (36.8 C)   Resp 20   Ht 5\' 8"  (1.727 m)   Wt 90.7 kg   SpO2 97%   BMI 30.40 kg/m  Physical Exam General: No distress Cardiac: regular rate Lungs: normal work of breathing Psych: calm  ED Course / MDM  EKG:   I have reviewed the labs performed to date as well as medications administered while in observation.  Recent changes in the last 24 hours include none.  Plan  Current plan is for SNF placement.    Lonell Grandchild, MD 08/23/22 [phone]  Insurance did not approve SNF. He will re-enroll in hospice. Will discharge patient to home. All questions answered. Patient comfortable with plan of discharge. Return precautions discussed with patient and specified on the after visit summary.    Lonell Grandchild, MD 08/23/22 1028

## 2022-08-23 NOTE — ED Notes (Signed)
Pt given breakfast tray

## 2022-08-23 NOTE — ED Notes (Signed)
Pt assisted back to bed.with no difficulty

## 2022-08-23 NOTE — ED Notes (Signed)
Pt hollering out, states he needs to sit up because his legs are cramping  Pt pulled up and repositioned in bed

## 2022-08-23 NOTE — TOC Progression Note (Signed)
Transition of Care [facility]) - Progression Note    Patient Details  Name: Matthew Campos MRN: [id number] Date of Birth: [birth year]  Transition of Care [facility]) CM/SW Contact  Karn Cassis, [place] Phone Number: 08/23/2022, 10:21 AM  Clinical Narrative: [name] is canceling authorization since pt revoked hospice he will have traditional Medicare until 6/1. LCSW explained to pt's wife that traditional Medicare requires qualifying 3 night stay in hospital which pt does not have. She states she cannot afford private pay SNF and requests pt return home with [facility] again. [name] with [employer] notified. EDP and RN updated. No other TOC needs at this time.       Expected Discharge Plan: Skilled Nursing Facility Barriers to Discharge: ED SNF auth  Expected Discharge Plan and Services In-house Referral: Clinical Social Work   Post Acute Care Choice: Skilled Nursing Facility Living arrangements for the past 2 months: Single Family Home                                       Social Determinants of Health (SDOH) Interventions SDOH Screenings   Food Insecurity: No Food Insecurity (07/31/2022)  Housing: Low Risk  (07/31/2022)  Transportation Needs: No Transportation Needs (05/09/2022)  Utilities: Not At Risk (07/31/2022)  Alcohol Screen: Low Risk  (07/31/2022)  Depression (PHQ2-9): Low Risk  (07/31/2022)  Financial Resource Strain: Low Risk  (07/31/2022)  Physical Activity: Inactive (07/31/2022)  Social Connections: Moderately Isolated (07/31/2022)  Stress: No Stress Concern Present (07/31/2022)  Tobacco Use: Medium Risk (08/20/2022)    Readmission Risk Interventions     No data to display

## 2022-08-23 NOTE — ED Notes (Signed)
Pt had good night.  No problems

## 2022-08-23 NOTE — ED Notes (Signed)
Pt assisted to [facility] and back to chair after a BM

## 2022-08-23 NOTE — ED Notes (Signed)
Pt hollering out and stating he needs to get up because he has soaked the bed  Went in to find pt sitting at end of bed with gown and linens soaked in urine; pt assisted to recliner and gown and linens changed

## 2022-08-23 NOTE — ED Notes (Signed)
Pt appears to be sleeping.

## 2022-08-27 ENCOUNTER — Telehealth: Payer: Self-pay | Admitting: Family Medicine

## 2022-08-28 NOTE — Telephone Encounter (Signed)
Appt made for 09/11/22, pt has not been seen since Jan

## 2022-09-10 ENCOUNTER — Encounter: Payer: 59 | Admitting: Family Medicine

## 2022-09-10 NOTE — Progress Notes (Deleted)
Matthew Campos is a 82 y.o. male presents to office today for annual physical exam examination.    Concerns today include: 1. Type 2 Diabetes with hypertension, hyperlipidemia and CAD:  Glucometer:***.   High at home: ***; Low at home: ***, Taking medication(s): ***,.  Last eye exam: UTD Last foot exam: needs Last A1c:  Lab Results  Component Value Date   HGBA1C 7.5 (H) 05/06/2022   Nephropathy screen indicated?: UTD Last flu, zoster and/or pneumovax:  Immunization History  Administered Date(s) Administered   Fluad Quad(high Dose 65+) 02/29/2016, 01/20/2017, 02/10/2018, 02/04/2020, 01/15/2021, 04/05/2022   Influenza, High Dose Seasonal PF 02/29/2016, 01/20/2017, 02/10/2018   Influenza, Seasonal, Injecte, Preservative Fre 02/03/2014   Influenza,inj,Quad PF,6+ Mos 01/01/2019   Influenza-Unspecified 02/03/2014, 02/29/2016, 01/20/2017, 01/20/2017, 02/10/2018, 02/10/2018   Moderna Sars-Covid-2 Vaccination 07/05/2019, 08/02/2019, 02/23/2020   PNEUMOCOCCAL CONJUGATE-20 01/15/2021   Pneumococcal Conjugate-13 11/25/2014, 11/25/2014   Pneumococcal Polysaccharide-23 05/30/2016, 05/30/2016, 01/01/2019   Pneumococcal-Unspecified 05/30/2016   Td 04/05/2022   Tdap 11/25/2014, 11/25/2014    ROS: ***dizziness, LOC, polyuria, polydipsia, unintended weight loss/gain, foot ulcerations, numbness or tingling in extremities, shortness of breath or chest pain.  2. Hypothyroidism ***  3. Alzheimers disease ***   Occupation: ***, Marital status: ***, Substance use: *** Diet: ***, Exercise: *** Last eye exam: *** Last dental exam: *** Last colonoscopy: *** Last mammogram: *** Last pap smear: *** Refills needed today: *** Immunizations needed: Immunization History  Administered Date(s) Administered   Fluad Quad(high Dose 65+) 02/29/2016, 01/20/2017, 02/10/2018, 02/04/2020, 01/15/2021, 04/05/2022   Influenza, High Dose Seasonal PF 02/29/2016, 01/20/2017, 02/10/2018   Influenza,  Seasonal, Injecte, Preservative Fre 02/03/2014   Influenza,inj,Quad PF,6+ Mos 01/01/2019   Influenza-Unspecified 02/03/2014, 02/29/2016, 01/20/2017, 01/20/2017, 02/10/2018, 02/10/2018   Moderna Sars-Covid-2 Vaccination 07/05/2019, 08/02/2019, 02/23/2020   PNEUMOCOCCAL CONJUGATE-20 01/15/2021   Pneumococcal Conjugate-13 11/25/2014, 11/25/2014   Pneumococcal Polysaccharide-23 05/30/2016, 05/30/2016, 01/01/2019   Pneumococcal-Unspecified 05/30/2016   Td 04/05/2022   Tdap 11/25/2014, 11/25/2014     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   Abdominal aortic aneurysm without rupture (HCC)    Anemia    Anxiety and depression    Aortic stenosis    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HCC)    CHF (congestive heart failure) (HCC)    Cholecystitis    Chronic ischemic heart disease    Complication of anesthesia    hard to be put to sleep   COPD (chronic obstructive pulmonary disease) (HCC)    Coronary artery disease    Diabetes mellitus without complication (HCC)    Diabetes mellitus without complication (HCC)    takes Januvia and Metformin daily   GERD (gastroesophageal reflux disease)    GERD (gastroesophageal reflux disease)    takes omeprazole daily   Headache(784.0)    sinus   History of bronchitis    last time >35yrs ago   Hyperlipidemia    takes Lipitor daily   Hypertension    Hypertension    takes Metoprolol daily   Infected sebaceous cyst 01/30/2021   Joint pain    Myocardial infarction (HCC)    x 3;last one about 3-55yrs ago   Neoplasm of uncertain behavior of bladder    Obesity    OSA (obstructive sleep apnea)    Pacemaker    Pancytopenia (HCC)    Paroxysmal atrial fibrillation (HCC)    Pneumonia    last itme about 78yrs ago   S/P TAVR (transcatheter aortic valve  replacement)    Severe aortic stenosis    Sinus bradycardia    Social History   Socioeconomic History   Marital status: Married    Spouse name:  Not on file   Number of children: Not on file   Years of education: Not on file   Highest education level: Not on file  Occupational History   Occupation: retired  Tobacco Use   Smoking status: Former    Types: Cigarettes   Smokeless tobacco: Never   Tobacco comments:    quit smoking 20+yrs ago  Vaping Use   Vaping Use: Never used  Substance and Sexual Activity   Alcohol use: No    Comment: last drank about 2-3 years ago   Drug use: No   Sexual activity: Yes  Other Topics Concern   Not on file  Social History Narrative   Lives with his wife - their grandson lives with them at times       Social Determinants of Health   Financial Resource Strain: Low Risk  (07/31/2022)   Overall Financial Resource Strain (CARDIA)    Difficulty of Paying Living Expenses: Not hard at all  Food Insecurity: No Food Insecurity (07/31/2022)   Hunger Vital Sign    Worried About Running Out of Food in the Last Year: Never true    Ran Out of Food in the Last Year: Never true  Transportation Needs: No Transportation Needs (05/09/2022)   PRAPARE - Administrator, Civil Service (Medical): No    Lack of Transportation (Non-Medical): No  Physical Activity: Inactive (07/31/2022)   Exercise Vital Sign    Days of Exercise per Week: 0 days    Minutes of Exercise per Session: 0 min  Stress: No Stress Concern Present (07/31/2022)   [employer] of Occupational Health - Occupational Stress Questionnaire    Feeling of Stress : Only a little  Social Connections: Moderately Isolated (07/31/2022)   Social Connection and Isolation Panel [NHANES]    Frequency of Communication with Friends and Family: More than three times a week    Frequency of Social Gatherings with Friends and Family: More than three times a week    Attends Religious Services: Never    Database administrator or Organizations: No    Attends Banker Meetings: Never    Marital Status: Married  Catering manager Violence:  Not At Risk (07/31/2022)   Humiliation, Afraid, Rape, and Kick questionnaire    Fear of Current or Ex-Partner: No    Emotionally Abused: No    Physically Abused: No    Sexually Abused: No   Past Surgical History:  Procedure Laterality Date   abdominal aneurysm stenting     ABDOMINAL AORTIC ANEURYSM REPAIR     per patient stents placed about 4-5 years ago   AORTIC VALVE REPLACEMENT     stated it was done in November   cholecystostomy tube     CORONARY ANGIOPLASTY  2010   CORONARY ANGIOPLASTY WITH STENT PLACEMENT     about 30 years ago per patient   cyst removed from left wrist     ESOPHAGOGASTRODUODENOSCOPY (EGD) WITH PROPOFOL N/A 04/15/2019   Procedure: ESOPHAGOGASTRODUODENOSCOPY (EGD) WITH PROPOFOL;  Surgeon: West Bali, MD;  Location: [facility];  Service: Endoscopy;  Laterality: N/A;  possible dilation   HERNIA REPAIR     left shoulder surgery     POLYPECTOMY  04/15/2019   Procedure: POLYPECTOMY;  Surgeon: West Bali, MD;  Location:  [facility];  Service: Endoscopy;;  duodenal    SHOULDER ARTHROSCOPY WITH ROTATOR CUFF REPAIR AND SUBACROMIAL DECOMPRESSION  04/17/2012   Procedure: SHOULDER ARTHROSCOPY WITH ROTATOR CUFF REPAIR AND SUBACROMIAL DECOMPRESSION;  Surgeon: Thera Flake., MD;  Location: [facility] OR;  Service: Orthopedics;  Laterality: Right;  RIGHT SHOULDER ROTATOR CUFF REPAIR INCLUDING ACROMIOPLASTY CHRONIC, ARTHROSCOPY SHOULDER DEBRIDEMENT EXTENSIVE   TONSILLECTOMY     VALVE REPLACEMENT     Family History  Problem Relation Age of Onset   Cancer Mother        Unknown type   Heart disease Father    Diabetes Father    Heart disease Sister    Seizures Brother    Diabetes Daughter    Diabetes Daughter    Heart attack Son    Colon cancer Mother        in her 41s.    Stomach cancer Neg Hx    Esophageal cancer Neg Hx     Current Outpatient Medications:    albuterol (PROVENTIL) (2.5 MG/3ML) 0.083% nebulizer solution, Take 3 mLs (2.5 mg total) by  nebulization every 4 (four) hours as needed for wheezing or shortness of breath., Disp: 75 mL, Rfl: 12   albuterol (VENTOLIN HFA) 108 (90 Base) MCG/ACT inhaler, Inhale 2 puffs into the lungs every 6 (six) hours as needed for wheezing or shortness of breath., Disp: 8.5 g, Rfl: 2   apixaban (ELIQUIS) 2.5 MG TABS tablet, Take 1 tablet (2.5 mg total) by mouth 2 (two) times daily., Disp: 60 tablet, Rfl: 3   aspirin EC 81 MG tablet, Take 81 mg by mouth daily. Swallow whole., Disp: , Rfl:    azelastine (ASTELIN) 0.1 % nasal spray, USE 1 SPRAY IN EACH NOSTRIL TWICE DAILY, Disp: 30 mL, Rfl: 5   Blood Glucose Monitoring Suppl (BLOOD GLUCOSE MONITOR SYSTEM) w/Device KIT, 1 Device by Does not apply route 2 (two) times daily. Dx E11.9, Disp: 1 kit, Rfl: 0   calcium carbonate (OSCAL) 1500 (600 Ca) MG TABS tablet, Take by mouth 2 (two) times daily with a meal., Disp: , Rfl:    clotrimazole-betamethasone (LOTRISONE) cream, APPLY TWICE DAILY TO TORSO FOR UP TO 14 DAYS (Patient not taking: Reported on 08/20/2022), Disp: 45 g, Rfl: 0   donepezil (ARICEPT) 5 MG tablet, Take 2 tablets (10 mg total) by mouth daily., Disp: 60 tablet, Rfl: 0   FARXIGA 10 MG TABS tablet, TAKE ONE TABLET EACH MORNING BEFORE BREAKFAST (Patient taking differently: Take 10 mg by mouth daily.), Disp: 90 tablet, Rfl: 0   ferrous sulfate 325 (65 FE) MG EC tablet, Take 1 tablet (325 mg total) by mouth every Monday, Wednesday, and Friday., Disp: 30 tablet, Rfl: 3   glipiZIDE (GLUCOTROL) 5 MG tablet, Take 1 tablet (5 mg total) by mouth 2 (two) times daily before a meal., Disp: 180 tablet, Rfl: 4   glucose blood (ONETOUCH VERIO) test strip, Check BS BID Dx E11.9, Disp: 200 each, Rfl: 3   HYDROcodone-acetaminophen (NORCO) 5-325 MG tablet, Take 1-2 tablets by mouth every 6 (six) hours as needed. (Patient not taking: Reported on 08/20/2022), Disp: 20 tablet, Rfl: 0   insulin glargine (LANTUS) 100 UNIT/ML injection, Inject 10 Units into the skin at bedtime.,  Disp: , Rfl:    Lancet Device MISC, Use to check BS BID Dx E11.9, Disp: 100 each, Rfl: 3   Lancets (ONETOUCH DELICA PLUS LANCET33G) MISC, Check BS BID Dx E11.9, Disp: 200 each, Rfl: 3   levothyroxine (SYNTHROID) 100 MCG  tablet, TAKE ONE (1) TABLET EACH DAY (Patient taking differently: Take 100 mcg by mouth daily before breakfast.), Disp: 90 tablet, Rfl: 3   lidocaine (LIDODERM) 5 %, APPLY 1 PATCH AND LEAVE ON FOR 12 HOURS THEN OFF FOR 12 HOURS (Patient taking differently: Place 1 patch onto the skin every 12 (twelve) hours as needed (for pain).), Disp: 30 patch, Rfl: 12   loperamide (IMODIUM) 2 MG capsule, TAKE 2 CAPSULES BY MOUTH AS NEEDED FOR DIARRHEA/LOOSE STOOL. FOLLOWED BY 1 CAPSULE AFTER EACH LOOSE STOOL (MAX OF 8 CAPSULES DAILY) (Patient taking differently: Take 4 mg by mouth as needed for diarrhea or loose stools.), Disp: 30 capsule, Rfl: 0   losartan (COZAAR) 25 MG tablet, Take 0.5 tablets (12.5 mg total) by mouth daily., Disp: 30 tablet, Rfl: 4   methocarbamol (ROBAXIN) 500 MG tablet, Take by mouth. (Patient not taking: Reported on 08/20/2022), Disp: , Rfl:    metoprolol succinate (TOPROL-XL) 25 MG 24 hr tablet, Take 0.5 tablets (12.5 mg total) by mouth daily., Disp: 45 tablet, Rfl: 2   Multiple Vitamin (MULTIVITAMIN WITH MINERALS) TABS, Take 1 tablet by mouth daily., Disp: , Rfl:    nitroGLYCERIN (NITROSTAT) 0.4 MG SL tablet, DISSOLVE 1 TAB UNDER TOUNGE FOR CHEST PAIN. MAY REPEAT EVERY 5 MINUTES FOR 3 DOSES. IF NO RELIEF CALL 911 OR GO TO ER (Patient taking differently: Place 0.4 mg under the tongue every 5 (five) minutes as needed for chest pain.), Disp: 25 tablet, Rfl: 2   ondansetron (ZOFRAN-ODT) 4 MG disintegrating tablet, TAKE ONE TABLET BY MOUTH EVERY EIGHT HOURS AS NEEDED (Patient not taking: Reported on 08/20/2022), Disp: 30 tablet, Rfl: 0   pantoprazole (PROTONIX) 40 MG tablet, Take 1 tablet (40 mg total) by mouth daily. (Patient taking differently: Take 40 mg by mouth 2 (two) times  daily.), Disp: 30 tablet, Rfl: 3   prochlorperazine (COMPAZINE) 10 MG tablet, Take 10 mg by mouth every 4 (four) hours as needed., Disp: , Rfl:    risperiDONE (RISPERDAL) 0.5 MG tablet, Take 1 tablet (0.5 mg total) by mouth daily., Disp: 30 tablet, Rfl: 2   risperiDONE (RISPERDAL) 1 MG tablet, Take 1 tablet (1 mg total) by mouth at bedtime., Disp: 30 tablet, Rfl: 2   rosuvastatin (CRESTOR) 10 MG tablet, TAKE ONE (1) TABLET EACH DAY (Patient taking differently: Take 10 mg by mouth daily.), Disp: 90 tablet, Rfl: 1   RYBELSUS 7 MG TABS, TAKE ONE CAPSULE BY MOUTH DAILY, Disp: 90 tablet, Rfl: 0   SENNA-S 8.6-50 MG tablet, Take by mouth. (Patient not taking: Reported on 08/20/2022), Disp: , Rfl:    SEROQUEL 25 MG tablet, Take 25 mg by mouth at bedtime., Disp: , Rfl:    sodium chloride (ALTAMIST SPRAY) 0.65 % nasal spray, Place into the nose., Disp: , Rfl:    spironolactone (ALDACTONE) 25 MG tablet, Take 0.5 tablets (12.5 mg total) by mouth daily., Disp: 15 tablet, Rfl: 5   SYMBICORT 80-4.5 MCG/ACT inhaler, Inhale 2 puffs into the lungs 2 (two) times daily., Disp: 10.2 g, Rfl: 2   torsemide (DEMADEX) 20 MG tablet, Take 2 tablets (40 mg total) by mouth daily. (Patient taking differently: Take 20 mg by mouth 2 (two) times daily. If swelling take additional tablet at lunch), Disp: 60 tablet, Rfl: 2  Allergies  Allergen Reactions   Codeine Nausea And Vomiting and Other (See Comments)   Latex Hives and Itching   Morphine    Lisinopril Cough     ROS: Review of Systems {ros;  complete:30496}    Physical exam {Exam, Complete:[id number]}    Assessment/ Plan: Gatha Mayer here for annual physical exam.   No problem-specific Assessment & Plan notes found for this encounter.   Counseled on healthy lifestyle choices, including diet (rich in fruits, vegetables and lean meats and low in salt and simple carbohydrates) and exercise (at least 30 minutes of moderate physical activity daily).  Patient to  follow up in 1 year for annual exam or sooner if needed.  Ellington Cornia M. Nadine Counts, DO

## 2022-09-11 ENCOUNTER — Encounter: Payer: Self-pay | Admitting: Family Medicine

## 2022-09-11 ENCOUNTER — Ambulatory Visit: Payer: 59 | Admitting: Family Medicine

## 2022-10-06 DIAGNOSIS — I2510 Atherosclerotic heart disease of native coronary artery without angina pectoris: Secondary | ICD-10-CM | POA: Diagnosis not present

## 2022-10-06 DIAGNOSIS — R739 Hyperglycemia, unspecified: Secondary | ICD-10-CM | POA: Diagnosis not present

## 2022-10-06 DIAGNOSIS — I517 Cardiomegaly: Secondary | ICD-10-CM | POA: Diagnosis not present

## 2022-10-06 DIAGNOSIS — Z95828 Presence of other vascular implants and grafts: Secondary | ICD-10-CM | POA: Diagnosis not present

## 2022-10-06 DIAGNOSIS — Z885 Allergy status to narcotic agent status: Secondary | ICD-10-CM | POA: Diagnosis not present

## 2022-10-06 DIAGNOSIS — I350 Nonrheumatic aortic (valve) stenosis: Secondary | ICD-10-CM | POA: Diagnosis not present

## 2022-10-06 DIAGNOSIS — Z794 Long term (current) use of insulin: Secondary | ICD-10-CM | POA: Diagnosis not present

## 2022-10-06 DIAGNOSIS — Z91040 Latex allergy status: Secondary | ICD-10-CM | POA: Diagnosis not present

## 2022-10-06 DIAGNOSIS — Z955 Presence of coronary angioplasty implant and graft: Secondary | ICD-10-CM | POA: Diagnosis not present

## 2022-10-06 DIAGNOSIS — R5381 Other malaise: Secondary | ICD-10-CM | POA: Diagnosis not present

## 2022-10-06 DIAGNOSIS — E785 Hyperlipidemia, unspecified: Secondary | ICD-10-CM | POA: Diagnosis not present

## 2022-10-06 DIAGNOSIS — R079 Chest pain, unspecified: Secondary | ICD-10-CM | POA: Diagnosis not present

## 2022-10-06 DIAGNOSIS — Z79899 Other long term (current) drug therapy: Secondary | ICD-10-CM | POA: Diagnosis not present

## 2022-10-06 DIAGNOSIS — I5022 Chronic systolic (congestive) heart failure: Secondary | ICD-10-CM | POA: Diagnosis not present

## 2022-10-06 DIAGNOSIS — R918 Other nonspecific abnormal finding of lung field: Secondary | ICD-10-CM | POA: Diagnosis not present

## 2022-10-06 DIAGNOSIS — Z515 Encounter for palliative care: Secondary | ICD-10-CM | POA: Diagnosis not present

## 2022-10-06 DIAGNOSIS — E039 Hypothyroidism, unspecified: Secondary | ICD-10-CM | POA: Diagnosis not present

## 2022-10-06 DIAGNOSIS — R531 Weakness: Secondary | ICD-10-CM | POA: Diagnosis not present

## 2022-10-06 DIAGNOSIS — Z7901 Long term (current) use of anticoagulants: Secondary | ICD-10-CM | POA: Diagnosis not present

## 2022-10-06 DIAGNOSIS — R072 Precordial pain: Secondary | ICD-10-CM | POA: Diagnosis not present

## 2022-10-06 DIAGNOSIS — J441 Chronic obstructive pulmonary disease with (acute) exacerbation: Secondary | ICD-10-CM | POA: Diagnosis not present

## 2022-10-06 DIAGNOSIS — K5900 Constipation, unspecified: Secondary | ICD-10-CM | POA: Diagnosis not present

## 2022-10-06 DIAGNOSIS — N183 Chronic kidney disease, stage 3 unspecified: Secondary | ICD-10-CM | POA: Diagnosis not present

## 2022-10-06 DIAGNOSIS — I959 Hypotension, unspecified: Secondary | ICD-10-CM | POA: Diagnosis not present

## 2022-10-06 DIAGNOSIS — I4891 Unspecified atrial fibrillation: Secondary | ICD-10-CM | POA: Diagnosis not present

## 2022-10-06 DIAGNOSIS — E1122 Type 2 diabetes mellitus with diabetic chronic kidney disease: Secondary | ICD-10-CM | POA: Diagnosis not present

## 2022-10-06 DIAGNOSIS — Z743 Need for continuous supervision: Secondary | ICD-10-CM | POA: Diagnosis not present

## 2022-10-06 DIAGNOSIS — I130 Hypertensive heart and chronic kidney disease with heart failure and stage 1 through stage 4 chronic kidney disease, or unspecified chronic kidney disease: Secondary | ICD-10-CM | POA: Diagnosis not present

## 2022-10-06 DIAGNOSIS — I714 Abdominal aortic aneurysm, without rupture, unspecified: Secondary | ICD-10-CM | POA: Diagnosis not present

## 2022-10-06 DIAGNOSIS — J9611 Chronic respiratory failure with hypoxia: Secondary | ICD-10-CM | POA: Diagnosis not present

## 2022-10-06 DIAGNOSIS — Z66 Do not resuscitate: Secondary | ICD-10-CM | POA: Diagnosis not present

## 2022-10-06 DIAGNOSIS — I5020 Unspecified systolic (congestive) heart failure: Secondary | ICD-10-CM | POA: Diagnosis not present

## 2022-10-10 DIAGNOSIS — I5020 Unspecified systolic (congestive) heart failure: Secondary | ICD-10-CM | POA: Diagnosis not present

## 2022-10-10 DIAGNOSIS — N183 Chronic kidney disease, stage 3 unspecified: Secondary | ICD-10-CM | POA: Diagnosis not present

## 2022-10-10 DIAGNOSIS — Z79899 Other long term (current) drug therapy: Secondary | ICD-10-CM | POA: Diagnosis not present

## 2022-10-10 DIAGNOSIS — J9611 Chronic respiratory failure with hypoxia: Secondary | ICD-10-CM | POA: Diagnosis not present

## 2022-10-10 DIAGNOSIS — Z955 Presence of coronary angioplasty implant and graft: Secondary | ICD-10-CM | POA: Diagnosis not present

## 2022-10-10 DIAGNOSIS — J441 Chronic obstructive pulmonary disease with (acute) exacerbation: Secondary | ICD-10-CM | POA: Diagnosis not present

## 2022-10-10 DIAGNOSIS — I4891 Unspecified atrial fibrillation: Secondary | ICD-10-CM | POA: Diagnosis not present

## 2022-10-10 DIAGNOSIS — I2510 Atherosclerotic heart disease of native coronary artery without angina pectoris: Secondary | ICD-10-CM | POA: Diagnosis not present

## 2022-10-10 DIAGNOSIS — E1122 Type 2 diabetes mellitus with diabetic chronic kidney disease: Secondary | ICD-10-CM | POA: Diagnosis not present

## 2022-10-10 DIAGNOSIS — E039 Hypothyroidism, unspecified: Secondary | ICD-10-CM | POA: Diagnosis not present

## 2022-10-15 DIAGNOSIS — J441 Chronic obstructive pulmonary disease with (acute) exacerbation: Secondary | ICD-10-CM | POA: Diagnosis not present

## 2022-10-15 DIAGNOSIS — I2510 Atherosclerotic heart disease of native coronary artery without angina pectoris: Secondary | ICD-10-CM | POA: Diagnosis not present

## 2022-10-15 DIAGNOSIS — Z794 Long term (current) use of insulin: Secondary | ICD-10-CM | POA: Diagnosis not present

## 2022-10-15 DIAGNOSIS — N183 Chronic kidney disease, stage 3 unspecified: Secondary | ICD-10-CM | POA: Diagnosis not present

## 2022-10-15 DIAGNOSIS — J9611 Chronic respiratory failure with hypoxia: Secondary | ICD-10-CM | POA: Diagnosis not present

## 2022-10-15 DIAGNOSIS — E1122 Type 2 diabetes mellitus with diabetic chronic kidney disease: Secondary | ICD-10-CM | POA: Diagnosis not present

## 2022-10-15 DIAGNOSIS — Z79899 Other long term (current) drug therapy: Secondary | ICD-10-CM | POA: Diagnosis not present

## 2022-10-15 DIAGNOSIS — Z515 Encounter for palliative care: Secondary | ICD-10-CM | POA: Diagnosis not present

## 2022-10-15 DIAGNOSIS — I5020 Unspecified systolic (congestive) heart failure: Secondary | ICD-10-CM | POA: Diagnosis not present

## 2022-10-16 DIAGNOSIS — F03C0 Unspecified dementia, severe, without behavioral disturbance, psychotic disturbance, mood disturbance, and anxiety: Secondary | ICD-10-CM | POA: Diagnosis not present

## 2022-10-16 DIAGNOSIS — E1169 Type 2 diabetes mellitus with other specified complication: Secondary | ICD-10-CM | POA: Diagnosis not present

## 2022-10-16 DIAGNOSIS — N183 Chronic kidney disease, stage 3 unspecified: Secondary | ICD-10-CM | POA: Diagnosis not present

## 2022-10-16 DIAGNOSIS — I4891 Unspecified atrial fibrillation: Secondary | ICD-10-CM | POA: Diagnosis not present

## 2022-10-16 DIAGNOSIS — I509 Heart failure, unspecified: Secondary | ICD-10-CM | POA: Diagnosis not present

## 2022-10-16 DIAGNOSIS — J441 Chronic obstructive pulmonary disease with (acute) exacerbation: Secondary | ICD-10-CM | POA: Diagnosis not present

## 2022-10-16 DIAGNOSIS — I2510 Atherosclerotic heart disease of native coronary artery without angina pectoris: Secondary | ICD-10-CM | POA: Diagnosis not present

## 2022-10-16 DIAGNOSIS — E039 Hypothyroidism, unspecified: Secondary | ICD-10-CM | POA: Diagnosis not present

## 2022-10-21 DIAGNOSIS — J449 Chronic obstructive pulmonary disease, unspecified: Secondary | ICD-10-CM | POA: Diagnosis not present

## 2022-10-21 DIAGNOSIS — I10 Essential (primary) hypertension: Secondary | ICD-10-CM | POA: Diagnosis not present

## 2022-10-21 DIAGNOSIS — F03C0 Unspecified dementia, severe, without behavioral disturbance, psychotic disturbance, mood disturbance, and anxiety: Secondary | ICD-10-CM | POA: Diagnosis not present

## 2022-10-22 DIAGNOSIS — Z515 Encounter for palliative care: Secondary | ICD-10-CM | POA: Diagnosis not present

## 2022-10-22 DIAGNOSIS — Z7901 Long term (current) use of anticoagulants: Secondary | ICD-10-CM | POA: Diagnosis not present

## 2022-10-22 DIAGNOSIS — J449 Chronic obstructive pulmonary disease, unspecified: Secondary | ICD-10-CM | POA: Diagnosis not present

## 2022-10-22 DIAGNOSIS — I5022 Chronic systolic (congestive) heart failure: Secondary | ICD-10-CM | POA: Diagnosis not present

## 2022-10-22 DIAGNOSIS — I25118 Atherosclerotic heart disease of native coronary artery with other forms of angina pectoris: Secondary | ICD-10-CM | POA: Diagnosis not present

## 2022-10-22 DIAGNOSIS — E1121 Type 2 diabetes mellitus with diabetic nephropathy: Secondary | ICD-10-CM | POA: Diagnosis not present

## 2022-10-22 DIAGNOSIS — I4891 Unspecified atrial fibrillation: Secondary | ICD-10-CM | POA: Diagnosis not present

## 2022-10-22 DIAGNOSIS — Z794 Long term (current) use of insulin: Secondary | ICD-10-CM | POA: Diagnosis not present

## 2022-10-26 DIAGNOSIS — Z950 Presence of cardiac pacemaker: Secondary | ICD-10-CM | POA: Diagnosis not present

## 2022-10-26 DIAGNOSIS — R531 Weakness: Secondary | ICD-10-CM | POA: Diagnosis not present

## 2022-10-26 DIAGNOSIS — R296 Repeated falls: Secondary | ICD-10-CM | POA: Diagnosis not present

## 2022-10-26 DIAGNOSIS — J449 Chronic obstructive pulmonary disease, unspecified: Secondary | ICD-10-CM | POA: Diagnosis not present

## 2022-10-26 DIAGNOSIS — Z7984 Long term (current) use of oral hypoglycemic drugs: Secondary | ICD-10-CM | POA: Diagnosis not present

## 2022-10-26 DIAGNOSIS — J9611 Chronic respiratory failure with hypoxia: Secondary | ICD-10-CM | POA: Diagnosis not present

## 2022-10-26 DIAGNOSIS — E785 Hyperlipidemia, unspecified: Secondary | ICD-10-CM | POA: Diagnosis not present

## 2022-10-26 DIAGNOSIS — Z7951 Long term (current) use of inhaled steroids: Secondary | ICD-10-CM | POA: Diagnosis not present

## 2022-10-26 DIAGNOSIS — Z7902 Long term (current) use of antithrombotics/antiplatelets: Secondary | ICD-10-CM | POA: Diagnosis not present

## 2022-10-26 DIAGNOSIS — I2510 Atherosclerotic heart disease of native coronary artery without angina pectoris: Secondary | ICD-10-CM | POA: Diagnosis not present

## 2022-10-26 DIAGNOSIS — Z7982 Long term (current) use of aspirin: Secondary | ICD-10-CM | POA: Diagnosis not present

## 2022-10-26 DIAGNOSIS — Z87891 Personal history of nicotine dependence: Secondary | ICD-10-CM | POA: Diagnosis not present

## 2022-10-26 DIAGNOSIS — Z8679 Personal history of other diseases of the circulatory system: Secondary | ICD-10-CM | POA: Diagnosis not present

## 2022-10-26 DIAGNOSIS — Z79899 Other long term (current) drug therapy: Secondary | ICD-10-CM | POA: Diagnosis not present

## 2022-10-26 DIAGNOSIS — N3000 Acute cystitis without hematuria: Secondary | ICD-10-CM | POA: Diagnosis not present

## 2022-10-26 DIAGNOSIS — N183 Chronic kidney disease, stage 3 unspecified: Secondary | ICD-10-CM | POA: Diagnosis not present

## 2022-10-26 DIAGNOSIS — Z888 Allergy status to other drugs, medicaments and biological substances status: Secondary | ICD-10-CM | POA: Diagnosis not present

## 2022-10-26 DIAGNOSIS — Z91040 Latex allergy status: Secondary | ICD-10-CM | POA: Diagnosis not present

## 2022-10-26 DIAGNOSIS — Z743 Need for continuous supervision: Secondary | ICD-10-CM | POA: Diagnosis not present

## 2022-10-26 DIAGNOSIS — E1122 Type 2 diabetes mellitus with diabetic chronic kidney disease: Secondary | ICD-10-CM | POA: Diagnosis not present

## 2022-10-26 DIAGNOSIS — G894 Chronic pain syndrome: Secondary | ICD-10-CM | POA: Diagnosis not present

## 2022-10-26 DIAGNOSIS — I482 Chronic atrial fibrillation, unspecified: Secondary | ICD-10-CM | POA: Diagnosis not present

## 2022-10-26 DIAGNOSIS — I130 Hypertensive heart and chronic kidney disease with heart failure and stage 1 through stage 4 chronic kidney disease, or unspecified chronic kidney disease: Secondary | ICD-10-CM | POA: Diagnosis not present

## 2022-10-26 DIAGNOSIS — Z885 Allergy status to narcotic agent status: Secondary | ICD-10-CM | POA: Diagnosis not present

## 2022-10-26 DIAGNOSIS — I5020 Unspecified systolic (congestive) heart failure: Secondary | ICD-10-CM | POA: Diagnosis not present

## 2022-10-29 DIAGNOSIS — I5022 Chronic systolic (congestive) heart failure: Secondary | ICD-10-CM | POA: Diagnosis not present

## 2022-10-29 DIAGNOSIS — Z515 Encounter for palliative care: Secondary | ICD-10-CM | POA: Diagnosis not present

## 2022-10-29 DIAGNOSIS — N390 Urinary tract infection, site not specified: Secondary | ICD-10-CM | POA: Diagnosis not present

## 2022-10-29 DIAGNOSIS — Z7901 Long term (current) use of anticoagulants: Secondary | ICD-10-CM | POA: Diagnosis not present

## 2022-10-29 DIAGNOSIS — J449 Chronic obstructive pulmonary disease, unspecified: Secondary | ICD-10-CM | POA: Diagnosis not present

## 2022-10-29 DIAGNOSIS — I4891 Unspecified atrial fibrillation: Secondary | ICD-10-CM | POA: Diagnosis not present

## 2022-10-29 DIAGNOSIS — I25118 Atherosclerotic heart disease of native coronary artery with other forms of angina pectoris: Secondary | ICD-10-CM | POA: Diagnosis not present

## 2022-10-29 DIAGNOSIS — E1169 Type 2 diabetes mellitus with other specified complication: Secondary | ICD-10-CM | POA: Diagnosis not present

## 2022-11-15 DIAGNOSIS — Z79899 Other long term (current) drug therapy: Secondary | ICD-10-CM | POA: Diagnosis not present

## 2022-11-15 DIAGNOSIS — J441 Chronic obstructive pulmonary disease with (acute) exacerbation: Secondary | ICD-10-CM | POA: Diagnosis not present

## 2022-11-15 DIAGNOSIS — G894 Chronic pain syndrome: Secondary | ICD-10-CM | POA: Diagnosis not present

## 2022-11-18 NOTE — Patient Instructions (Signed)
Visit Information  Thank you for taking time to visit with me today. Please don't hesitate to contact me if I can be of assistance to you.   Following are the goals we discussed today:   Goals Addressed               This Visit's Progress     Patient Stated     HTN.CHF Management THN) (pt-stated)   Not on track     Care Coordination Interventions: Interventions Today    Flowsheet Row Most Recent Value  Chronic Disease   Chronic disease during today's visit Other  [fatigue, incontinence, cardiology services]  General Interventions   General Interventions Discussed/Reviewed Doctor Visits, Durable Medical Equipment (DME)  Doctor Visits Discussed/Reviewed PCP, Specialist  Durable Medical Equipment (DME) Other  [condom catheter]  PCP/Specialist Visits Compliance with follow-up visit  Communication with RN  [Erin [facility]/Dr Senaida Ores CV]  Education Interventions   Education Provided Provided Education  [condom catheter]  Provided Verbal Education On Labs, Medication, Other  Labs Reviewed Kidney Function  Mental Health Interventions   Mental Health Discussed/Reviewed Mental Health Reviewed, Coping Strategies  Pharmacy Interventions   Pharmacy Dicussed/Reviewed Medications and their functions, Pharmacy Topics Discussed              Our next appointment is by telephone on 11/19/22 at 1115  Please call the care guide team at [phone] if you need to cancel or reschedule your appointment.   If you are experiencing a Mental Health or Behavioral Health Crisis or need someone to talk to, please call the Suicide and Crisis Lifeline: 988 call the [place] National Suicide Prevention Lifeline: 414-455-0245 or TTY: 3087971176 TTY 270-774-8969) to talk to a trained counselor call 1-800-273-TALK (toll free, 24 hour hotline) call the [facility] [phone] call 911   No computer access, no preference for copy of AVS    The patient has been provided with  contact information for the care management team and has been advised to call with any health related questions or concerns.    L. Noelle Penner, RN, BSN, CCM [facility] Care Management Community Coordinator Office number [phone]

## 2022-11-18 NOTE — Patient Instructions (Addendum)
Visit Information  Thank you for taking time to visit with me today. Please don't hesitate to contact me if I can be of assistance to you.   Following are the goals we discussed today:   Goals Addressed               This Visit's Progress     Patient Stated     COMPLETED: Assistance with Transportation (pt-stated)        Closed goal to merge all goals to 2024      HTN.CHF Management THN) (pt-stated)   Not on track     Care Coordination Interventions: Interventions Today    Flowsheet Row Most Recent Value  Chronic Disease   Chronic disease during today's visit Other  [fatigue, incontinence, cardiology services]  General Interventions   General Interventions Discussed/Reviewed Doctor Visits, Durable Medical Equipment (DME)  Doctor Visits Discussed/Reviewed PCP, Specialist  Durable Medical Equipment (DME) Other  [condom catheter]  PCP/Specialist Visits Compliance with follow-up visit  Communication with RN  [Erin [facility]/Dr Senaida Ores CV]  Education Interventions   Education Provided Provided Education  [condom catheter]  Provided Verbal Education On Labs, Medication, Other  Labs Reviewed Kidney Function  Mental Health Interventions   Mental Health Discussed/Reviewed Mental Health Reviewed, Coping Strategies  Pharmacy Interventions   Pharmacy Dicussed/Reviewed Medications and their functions, Pharmacy Topics Discussed            Other     COMPLETED: Caregiver Strain        Closed goal to merge all goals to 2024      COMPLETED: Chronic Disease Management Needs        Closed goal to merge all goals to 2024      COMPLETED: Need for Hospital Bed        Closed goal to merge all goals to 2024        Our next appointment is by telephone on 06/14/22 at 0930  Please call the care guide team at [phone] if you need to cancel or reschedule your appointment.   If you are experiencing a Mental Health or Behavioral Health Crisis or need someone to talk to, please call  the Suicide and Crisis Lifeline: 988 call the [place] National Suicide Prevention Lifeline: 606-611-5029 or TTY: 504-667-7754 TTY 501-374-2511) to talk to a trained counselor call 1-800-273-TALK (toll free, 24 hour hotline) call the [facility]: [phone] call 911   No computer access, no preference for copy of AVS      The patient has been provided with contact information for the care management team and has been advised to call with any health related questions or concerns.    L. Noelle Penner, RN, BSN, CCM [facility] Care Management Community Coordinator Office number [phone]

## 2022-11-18 NOTE — Patient Instructions (Signed)
Visit Information  Thank you for taking time to visit with me today. Please don't hesitate to contact me if I can be of assistance to you.   Following are the goals we discussed today:   Goals Addressed               This Visit's Progress     Patient Stated     HTN.CHF Management THN) (pt-stated)        Care Coordination Interventions: Interventions Today    Flowsheet Row Most Recent Value  General Interventions   General Interventions Discussed/Reviewed Communication with  Communication with RN  [Erin [facility]/Dr Senaida Ores CV]  Education Interventions   Education Provided Provided Education  Provided Verbal Education On Labs, Medication, Other  Labs Reviewed Kidney Function  Pharmacy Interventions   Pharmacy Dicussed/Reviewed Medications and their functions, Pharmacy Topics Discussed              Our next appointment is by telephone on 06/14/22 at 0930  Please call the care guide team at [phone] if you need to cancel or reschedule your appointment.   If you are experiencing a Mental Health or Behavioral Health Crisis or need someone to talk to, please call the Suicide and Crisis Lifeline: 988 call the [place] National Suicide Prevention Lifeline: 573-328-6187 or TTY: 9366044150 TTY 618-856-9667) to talk to a trained counselor call 1-800-273-TALK (toll free, 24 hour hotline) call the [facility]: [phone] call 911   No computer access, no preference for copy of AVS     The patient has been provided with contact information for the care management team and has been advised to call with any health related questions or concerns.    L. Noelle Penner, RN, BSN, CCM [facility] Care Management Community Coordinator Office number [phone]

## 2022-11-19 ENCOUNTER — Ambulatory Visit: Payer: Self-pay | Admitting: *Deleted

## 2022-11-19 DIAGNOSIS — J449 Chronic obstructive pulmonary disease, unspecified: Secondary | ICD-10-CM | POA: Diagnosis not present

## 2022-11-19 DIAGNOSIS — I5022 Chronic systolic (congestive) heart failure: Secondary | ICD-10-CM | POA: Diagnosis not present

## 2022-11-19 DIAGNOSIS — Z515 Encounter for palliative care: Secondary | ICD-10-CM | POA: Diagnosis not present

## 2022-11-19 DIAGNOSIS — I10 Essential (primary) hypertension: Secondary | ICD-10-CM | POA: Diagnosis not present

## 2022-11-19 DIAGNOSIS — G894 Chronic pain syndrome: Secondary | ICD-10-CM | POA: Diagnosis not present

## 2022-11-19 DIAGNOSIS — I25118 Atherosclerotic heart disease of native coronary artery with other forms of angina pectoris: Secondary | ICD-10-CM | POA: Diagnosis not present

## 2022-11-19 DIAGNOSIS — F01518 Vascular dementia, unspecified severity, with other behavioral disturbance: Secondary | ICD-10-CM | POA: Diagnosis not present

## 2022-11-19 NOTE — Patient Outreach (Signed)
  Care Coordination   Follow Up Visit Note   11/19/2022 Name: Matthew Campos MRN: [id number] DOB: December 23, 1940  Matthew Campos is a 82 y.o. year old male who sees Raliegh Ip, DO for primary care. I spoke with wife, Matthew Campos by phone today.  What matters to the patients health and wellness today?  Matthew Campos is at [facility] in [place] with hospice services NOT in memory care  Lost clothes with his name in the clothes at the facility  Matthew Campos reports they went missing after she asked staff to wash them "a few weeks ago"  Wife spoke with patient on 11/18/22 He reports he had on a black t shirt and pajamas. He has been placed in hospital gowns at times    Wife continues with shoulder pain and does not travel well , therefore has not had many visits to the facility   Encouraged her to refer Matthew Campos to the facility doctor & discuss with him that she is following the facility doctor's orders as patient is frequently stating he misses her, wants to go home and inquires if he is "going to die"   Grand daughter is supportive and visits frequently   Goals Addressed               This Visit's Progress     Patient Stated     [facility] nurse care coordination services (hospice in snf) (pt-stated)   On track     Care Coordination Interventions: Interventions Today    Flowsheet Row Most Recent Value  Chronic Disease   Chronic disease during today's visit Other  [skilled nursing hospice stay [facility]]  General Interventions   General Interventions Discussed/Reviewed General Interventions Reviewed, [employer], Communication with  Doctor Visits Discussed/Reviewed Doctor Visits Reviewed, PCP  PCP/Specialist Visits Compliance with follow-up visit  Communication with --  Merchant navy officer to [employer] with SW, [place] who reports she had not been aware of the patient's missing clothes and will look into it. She will return a call  to the wife]  Mental Health Interventions   Mental Health Discussed/Reviewed Mental Health Reviewed, Coping Strategies              SDOH assessments and interventions completed:  No     Care Coordination Interventions:  Yes, provided   Follow up plan: No further intervention required. Patient is confirmed to be at [facility] [address] [place] [phone]    Encounter Outcome:  Pt. Visit Completed \   L. Noelle Penner, RN, BSN, CCM [facility] Care Management Community Coordinator Office number [phone]

## 2022-11-19 NOTE — Patient Instructions (Signed)
Visit Information  Thank you for taking time to visit with me today. Please don't hesitate to contact me if I can be of assistance to you.   Following are the goals we discussed today:   Goals Addressed               This Visit's Progress     Patient Stated     COMPLETED: [facility] nurse care coordination services (hospice in snf) (pt-stated)   On track     Closure of goal as patient is in skilled nursing facility with access to social worker for Care Coordination Interventions: Interventions Today    Flowsheet Row Most Recent Value  Chronic Disease   Chronic disease during today's visit Other  [skilled nursing hospice stay [facility]]  General Interventions   General Interventions Discussed/Reviewed General Interventions Reviewed, [employer], Communication with  Doctor Visits Discussed/Reviewed Doctor Visits Reviewed, PCP  PCP/Specialist Visits Compliance with follow-up visit  Communication with --  Merchant navy officer to [employer] with SW, [place] who reports she had not been aware of the patient's missing clothes and will look into it. She will return a call to the wife]  Mental Health Interventions   Mental Health Discussed/Reviewed Mental Health Reviewed, Coping Strategies              Our next appointment is  None  on n/a at n/a  Please call the care guide team at [phone] if you need to cancel or reschedule your appointment.   If you are experiencing a Mental Health or Behavioral Health Crisis or need someone to talk to, please call the Suicide and Crisis Lifeline: 988 call the [place] National Suicide Prevention Lifeline: 919-387-7636 or TTY: (541) 045-3737 TTY (908) 175-4564) to talk to a trained counselor call 1-800-273-TALK (toll free, 24 hour hotline) call the [facility]: 413-469-4679 call 911   No computer access, no preference for copy of AVS       The patient has been provided with contact information for the care  management team and has been advised to call with any health related questions or concerns.  No further follow up required: has Child psychotherapist assisting him in [place] [facility]  [place] L. Noelle Penner, RN, BSN, CCM [facility] Care Management Community Coordinator Office number [phone]

## 2022-11-19 NOTE — Patient Outreach (Signed)
  Care Coordination   11/19/2022 Name: Matthew Campos MRN: [id number] DOB: [birth year]   Care Coordination Outreach Attempts:  An unsuccessful telephone outreach was attempted today to offer the patient information about available care coordination services. Called wife home number [phone]   Follow Up Plan:  Additional outreach attempts will be made to offer the patient care coordination information and services.   Encounter Outcome:  No Answer   Care Coordination Interventions:  No, not indicated      L. Noelle Penner, RN, BSN, CCM [facility] Care Management Community Coordinator Office number [phone]

## 2022-11-26 DIAGNOSIS — I5022 Chronic systolic (congestive) heart failure: Secondary | ICD-10-CM | POA: Diagnosis not present

## 2022-11-26 DIAGNOSIS — R296 Repeated falls: Secondary | ICD-10-CM | POA: Diagnosis not present

## 2022-11-26 DIAGNOSIS — F01518 Vascular dementia, unspecified severity, with other behavioral disturbance: Secondary | ICD-10-CM | POA: Diagnosis not present

## 2022-11-26 DIAGNOSIS — J449 Chronic obstructive pulmonary disease, unspecified: Secondary | ICD-10-CM | POA: Diagnosis not present

## 2022-12-03 DIAGNOSIS — Z515 Encounter for palliative care: Secondary | ICD-10-CM | POA: Diagnosis not present

## 2022-12-03 DIAGNOSIS — F03C0 Unspecified dementia, severe, without behavioral disturbance, psychotic disturbance, mood disturbance, and anxiety: Secondary | ICD-10-CM | POA: Diagnosis not present

## 2022-12-03 DIAGNOSIS — G894 Chronic pain syndrome: Secondary | ICD-10-CM | POA: Diagnosis not present

## 2022-12-10 DIAGNOSIS — Z7901 Long term (current) use of anticoagulants: Secondary | ICD-10-CM | POA: Diagnosis not present

## 2022-12-10 DIAGNOSIS — Z515 Encounter for palliative care: Secondary | ICD-10-CM | POA: Diagnosis not present

## 2022-12-10 DIAGNOSIS — I4891 Unspecified atrial fibrillation: Secondary | ICD-10-CM | POA: Diagnosis not present

## 2022-12-10 DIAGNOSIS — I25118 Atherosclerotic heart disease of native coronary artery with other forms of angina pectoris: Secondary | ICD-10-CM | POA: Diagnosis not present

## 2022-12-10 DIAGNOSIS — I5022 Chronic systolic (congestive) heart failure: Secondary | ICD-10-CM | POA: Diagnosis not present

## 2022-12-10 DIAGNOSIS — J9611 Chronic respiratory failure with hypoxia: Secondary | ICD-10-CM | POA: Diagnosis not present

## 2022-12-10 DIAGNOSIS — J449 Chronic obstructive pulmonary disease, unspecified: Secondary | ICD-10-CM | POA: Diagnosis not present

## 2022-12-13 DIAGNOSIS — S51011D Laceration without foreign body of right elbow, subsequent encounter: Secondary | ICD-10-CM | POA: Diagnosis not present

## 2022-12-17 DIAGNOSIS — M549 Dorsalgia, unspecified: Secondary | ICD-10-CM | POA: Diagnosis not present

## 2022-12-17 DIAGNOSIS — Z515 Encounter for palliative care: Secondary | ICD-10-CM | POA: Diagnosis not present

## 2022-12-23 DIAGNOSIS — R296 Repeated falls: Secondary | ICD-10-CM | POA: Diagnosis not present

## 2022-12-23 DIAGNOSIS — I10 Essential (primary) hypertension: Secondary | ICD-10-CM | POA: Diagnosis not present

## 2022-12-24 DIAGNOSIS — Z515 Encounter for palliative care: Secondary | ICD-10-CM | POA: Diagnosis not present

## 2022-12-24 DIAGNOSIS — J441 Chronic obstructive pulmonary disease with (acute) exacerbation: Secondary | ICD-10-CM | POA: Diagnosis not present

## 2022-12-30 DIAGNOSIS — Z79899 Other long term (current) drug therapy: Secondary | ICD-10-CM | POA: Diagnosis not present

## 2022-12-30 DIAGNOSIS — G894 Chronic pain syndrome: Secondary | ICD-10-CM | POA: Diagnosis not present

## 2022-12-30 DIAGNOSIS — H04129 Dry eye syndrome of unspecified lacrimal gland: Secondary | ICD-10-CM | POA: Diagnosis not present

## 2022-12-31 DIAGNOSIS — H02106 Unspecified ectropion of left eye, unspecified eyelid: Secondary | ICD-10-CM | POA: Diagnosis not present

## 2022-12-31 DIAGNOSIS — Z515 Encounter for palliative care: Secondary | ICD-10-CM | POA: Diagnosis not present

## 2023-01-02 DIAGNOSIS — G894 Chronic pain syndrome: Secondary | ICD-10-CM | POA: Diagnosis not present

## 2023-01-02 DIAGNOSIS — Z79899 Other long term (current) drug therapy: Secondary | ICD-10-CM | POA: Diagnosis not present

## 2023-01-07 DIAGNOSIS — Z515 Encounter for palliative care: Secondary | ICD-10-CM | POA: Diagnosis not present

## 2023-01-07 DIAGNOSIS — R0681 Apnea, not elsewhere classified: Secondary | ICD-10-CM | POA: Diagnosis not present

## 2023-01-08 DIAGNOSIS — Z515 Encounter for palliative care: Secondary | ICD-10-CM | POA: Diagnosis not present

## 2023-01-08 DIAGNOSIS — G894 Chronic pain syndrome: Secondary | ICD-10-CM | POA: Diagnosis not present

## 2023-01-15 DIAGNOSIS — I10 Essential (primary) hypertension: Secondary | ICD-10-CM | POA: Diagnosis not present

## 2023-01-15 DIAGNOSIS — G894 Chronic pain syndrome: Secondary | ICD-10-CM | POA: Diagnosis not present

## 2023-01-15 DIAGNOSIS — E039 Hypothyroidism, unspecified: Secondary | ICD-10-CM | POA: Diagnosis not present

## 2023-01-29 DIAGNOSIS — I10 Essential (primary) hypertension: Secondary | ICD-10-CM | POA: Diagnosis not present

## 2023-01-29 DIAGNOSIS — G894 Chronic pain syndrome: Secondary | ICD-10-CM | POA: Diagnosis not present

## 2023-02-14 DIAGNOSIS — J441 Chronic obstructive pulmonary disease with (acute) exacerbation: Secondary | ICD-10-CM | POA: Diagnosis not present

## 2023-02-14 DIAGNOSIS — G894 Chronic pain syndrome: Secondary | ICD-10-CM | POA: Diagnosis not present

## 2023-02-19 DIAGNOSIS — J441 Chronic obstructive pulmonary disease with (acute) exacerbation: Secondary | ICD-10-CM | POA: Diagnosis not present

## 2023-02-19 DIAGNOSIS — G894 Chronic pain syndrome: Secondary | ICD-10-CM | POA: Diagnosis not present

## 2023-03-12 DIAGNOSIS — G894 Chronic pain syndrome: Secondary | ICD-10-CM | POA: Diagnosis not present

## 2023-03-12 DIAGNOSIS — I10 Essential (primary) hypertension: Secondary | ICD-10-CM | POA: Diagnosis not present

## 2023-03-18 DIAGNOSIS — I10 Essential (primary) hypertension: Secondary | ICD-10-CM | POA: Diagnosis not present

## 2023-03-18 DIAGNOSIS — I4891 Unspecified atrial fibrillation: Secondary | ICD-10-CM | POA: Diagnosis not present

## 2023-03-18 DIAGNOSIS — R296 Repeated falls: Secondary | ICD-10-CM | POA: Diagnosis not present

## 2023-03-18 DIAGNOSIS — E039 Hypothyroidism, unspecified: Secondary | ICD-10-CM | POA: Diagnosis not present

## 2023-03-18 DIAGNOSIS — E1169 Type 2 diabetes mellitus with other specified complication: Secondary | ICD-10-CM | POA: Diagnosis not present

## 2023-03-18 DIAGNOSIS — Z515 Encounter for palliative care: Secondary | ICD-10-CM | POA: Diagnosis not present

## 2023-03-18 DIAGNOSIS — I5022 Chronic systolic (congestive) heart failure: Secondary | ICD-10-CM | POA: Diagnosis not present

## 2023-04-16 DEATH — deceased
